# Patient Record
Sex: Male | Born: 1965 | Race: Black or African American | Hispanic: No | Marital: Married | Smoking: Former smoker
Health system: Southern US, Community
[De-identification: ages and names within clinical notes are randomized; demographics above are authoritative.]

## PROBLEM LIST (undated history)

## (undated) DIAGNOSIS — T7840XA Allergy, unspecified, initial encounter: Secondary | ICD-10-CM

## (undated) DIAGNOSIS — I209 Angina pectoris, unspecified: Secondary | ICD-10-CM

## (undated) DIAGNOSIS — F329 Major depressive disorder, single episode, unspecified: Secondary | ICD-10-CM

## (undated) DIAGNOSIS — I82409 Acute embolism and thrombosis of unspecified deep veins of unspecified lower extremity: Secondary | ICD-10-CM

## (undated) DIAGNOSIS — I428 Other cardiomyopathies: Secondary | ICD-10-CM

## (undated) DIAGNOSIS — G894 Chronic pain syndrome: Secondary | ICD-10-CM

## (undated) DIAGNOSIS — R002 Palpitations: Secondary | ICD-10-CM

## (undated) DIAGNOSIS — C61 Malignant neoplasm of prostate: Secondary | ICD-10-CM

## (undated) DIAGNOSIS — I422 Other hypertrophic cardiomyopathy: Secondary | ICD-10-CM

## (undated) DIAGNOSIS — Z87442 Personal history of urinary calculi: Secondary | ICD-10-CM

## (undated) DIAGNOSIS — G4730 Sleep apnea, unspecified: Secondary | ICD-10-CM

## (undated) DIAGNOSIS — E739 Lactose intolerance, unspecified: Secondary | ICD-10-CM

## (undated) DIAGNOSIS — D689 Coagulation defect, unspecified: Secondary | ICD-10-CM

## (undated) DIAGNOSIS — M1990 Unspecified osteoarthritis, unspecified site: Secondary | ICD-10-CM

## (undated) DIAGNOSIS — I10 Essential (primary) hypertension: Secondary | ICD-10-CM

## (undated) DIAGNOSIS — I739 Peripheral vascular disease, unspecified: Secondary | ICD-10-CM

## (undated) DIAGNOSIS — J45909 Unspecified asthma, uncomplicated: Secondary | ICD-10-CM

## (undated) DIAGNOSIS — R0602 Shortness of breath: Secondary | ICD-10-CM

## (undated) DIAGNOSIS — D869 Sarcoidosis, unspecified: Secondary | ICD-10-CM

## (undated) DIAGNOSIS — I499 Cardiac arrhythmia, unspecified: Secondary | ICD-10-CM

## (undated) DIAGNOSIS — E538 Deficiency of other specified B group vitamins: Secondary | ICD-10-CM

## (undated) DIAGNOSIS — E7800 Pure hypercholesterolemia, unspecified: Secondary | ICD-10-CM

## (undated) DIAGNOSIS — K219 Gastro-esophageal reflux disease without esophagitis: Secondary | ICD-10-CM

## (undated) DIAGNOSIS — F419 Anxiety disorder, unspecified: Secondary | ICD-10-CM

## (undated) DIAGNOSIS — C801 Malignant (primary) neoplasm, unspecified: Secondary | ICD-10-CM

## (undated) DIAGNOSIS — J189 Pneumonia, unspecified organism: Secondary | ICD-10-CM

## (undated) HISTORY — DX: Allergy, unspecified, initial encounter: T78.40XA

## (undated) HISTORY — PX: PROSTATE SURGERY: SHX751

## (undated) HISTORY — PX: PENILE PROSTHESIS IMPLANT: SHX240

## (undated) HISTORY — PX: EYE SURGERY: SHX253

## (undated) HISTORY — DX: Palpitations: R00.2

## (undated) HISTORY — DX: Other cardiomyopathies: I42.8

## (undated) HISTORY — DX: Sleep apnea, unspecified: G47.30

## (undated) HISTORY — PX: HAND SURGERY: SHX662

## (undated) HISTORY — DX: Sarcoidosis, unspecified: D86.9

## (undated) HISTORY — PX: LEG SKIN LESION  BIOPSY / EXCISION: SUR473

## (undated) HISTORY — DX: Pure hypercholesterolemia, unspecified: E78.00

## (undated) HISTORY — DX: Deficiency of other specified B group vitamins: E53.8

## (undated) HISTORY — PX: OTHER SURGICAL HISTORY: SHX169

## (undated) HISTORY — DX: Lactose intolerance, unspecified: E73.9

## (undated) HISTORY — DX: Coagulation defect, unspecified: D68.9

## (undated) HISTORY — DX: Chronic pain syndrome: G89.4

## (undated) HISTORY — PX: PARTIAL NEPHRECTOMY: SHX414

## (undated) HISTORY — DX: Major depressive disorder, single episode, unspecified: F32.9

## (undated) HISTORY — PX: TONSILLECTOMY: SUR1361

## (undated) HISTORY — PX: ELBOW SURGERY: SHX618

## (undated) HISTORY — PX: LUMBAR FUSION: SHX111

## (undated) HISTORY — PX: CARDIAC CATHETERIZATION: SHX172

## (undated) HISTORY — DX: Unspecified osteoarthritis, unspecified site: M19.90

## (undated) HISTORY — DX: Essential (primary) hypertension: I10

## (undated) HISTORY — DX: Personal history of urinary calculi: Z87.442

## (undated) HISTORY — DX: Gastro-esophageal reflux disease without esophagitis: K21.9

## (undated) HISTORY — DX: Unspecified asthma, uncomplicated: J45.909

---

## 1999-06-02 ENCOUNTER — Emergency Department (HOSPITAL_COMMUNITY): Admission: EM | Admit: 1999-06-02 | Discharge: 1999-06-02 | Payer: Self-pay | Admitting: Emergency Medicine

## 1999-07-05 ENCOUNTER — Inpatient Hospital Stay (HOSPITAL_COMMUNITY): Admission: EM | Admit: 1999-07-05 | Discharge: 1999-07-08 | Payer: Self-pay | Admitting: Emergency Medicine

## 1999-07-05 ENCOUNTER — Encounter: Payer: Self-pay | Admitting: Emergency Medicine

## 1999-07-06 ENCOUNTER — Encounter: Payer: Self-pay | Admitting: Cardiovascular Disease

## 1999-07-08 ENCOUNTER — Encounter: Payer: Self-pay | Admitting: Cardiovascular Disease

## 1999-09-07 ENCOUNTER — Emergency Department (HOSPITAL_COMMUNITY): Admission: EM | Admit: 1999-09-07 | Discharge: 1999-09-07 | Payer: Self-pay | Admitting: Emergency Medicine

## 1999-09-07 ENCOUNTER — Encounter: Payer: Self-pay | Admitting: Emergency Medicine

## 2000-02-01 ENCOUNTER — Ambulatory Visit (HOSPITAL_COMMUNITY): Admission: RE | Admit: 2000-02-01 | Discharge: 2000-02-01 | Payer: Self-pay | Admitting: Neurology

## 2000-02-01 ENCOUNTER — Encounter: Payer: Self-pay | Admitting: Neurology

## 2000-02-01 ENCOUNTER — Emergency Department (HOSPITAL_COMMUNITY): Admission: EM | Admit: 2000-02-01 | Discharge: 2000-02-01 | Payer: Self-pay | Admitting: Emergency Medicine

## 2001-09-06 DIAGNOSIS — I739 Peripheral vascular disease, unspecified: Secondary | ICD-10-CM

## 2001-09-06 HISTORY — DX: Peripheral vascular disease, unspecified: I73.9

## 2001-10-08 ENCOUNTER — Encounter: Payer: Self-pay | Admitting: Emergency Medicine

## 2001-10-08 ENCOUNTER — Emergency Department (HOSPITAL_COMMUNITY): Admission: EM | Admit: 2001-10-08 | Discharge: 2001-10-08 | Payer: Self-pay | Admitting: Emergency Medicine

## 2001-11-06 ENCOUNTER — Ambulatory Visit (HOSPITAL_COMMUNITY): Admission: RE | Admit: 2001-11-06 | Discharge: 2001-11-06 | Payer: Self-pay | Admitting: Neurosurgery

## 2001-11-06 ENCOUNTER — Encounter: Payer: Self-pay | Admitting: Neurosurgery

## 2002-01-18 ENCOUNTER — Encounter: Payer: Self-pay | Admitting: Neurosurgery

## 2002-01-18 ENCOUNTER — Encounter: Admission: RE | Admit: 2002-01-18 | Discharge: 2002-01-18 | Payer: Self-pay | Admitting: Neurosurgery

## 2002-01-29 ENCOUNTER — Ambulatory Visit (HOSPITAL_COMMUNITY): Admission: RE | Admit: 2002-01-29 | Discharge: 2002-01-29 | Payer: Self-pay | Admitting: Neurosurgery

## 2002-01-29 ENCOUNTER — Encounter: Payer: Self-pay | Admitting: Neurosurgery

## 2002-02-20 ENCOUNTER — Ambulatory Visit (HOSPITAL_COMMUNITY): Admission: RE | Admit: 2002-02-20 | Discharge: 2002-02-20 | Payer: Self-pay | Admitting: Specialist

## 2002-02-20 ENCOUNTER — Encounter: Payer: Self-pay | Admitting: Specialist

## 2002-04-16 ENCOUNTER — Encounter: Payer: Self-pay | Admitting: Internal Medicine

## 2002-04-16 ENCOUNTER — Inpatient Hospital Stay (HOSPITAL_COMMUNITY): Admission: EM | Admit: 2002-04-16 | Discharge: 2002-04-19 | Payer: Self-pay | Admitting: Emergency Medicine

## 2002-04-16 ENCOUNTER — Encounter: Payer: Self-pay | Admitting: Emergency Medicine

## 2002-04-17 ENCOUNTER — Encounter: Payer: Self-pay | Admitting: Internal Medicine

## 2002-04-17 ENCOUNTER — Encounter (INDEPENDENT_AMBULATORY_CARE_PROVIDER_SITE_OTHER): Payer: Self-pay | Admitting: Cardiovascular Disease

## 2002-07-16 ENCOUNTER — Encounter: Payer: Self-pay | Admitting: Orthopedic Surgery

## 2002-07-16 ENCOUNTER — Ambulatory Visit (HOSPITAL_COMMUNITY): Admission: RE | Admit: 2002-07-16 | Discharge: 2002-07-16 | Payer: Self-pay | Admitting: Orthopedic Surgery

## 2003-04-30 ENCOUNTER — Encounter: Payer: Self-pay | Admitting: Emergency Medicine

## 2003-04-30 ENCOUNTER — Emergency Department (HOSPITAL_COMMUNITY): Admission: EM | Admit: 2003-04-30 | Discharge: 2003-04-30 | Payer: Self-pay | Admitting: Emergency Medicine

## 2003-05-17 ENCOUNTER — Encounter: Admission: RE | Admit: 2003-05-17 | Discharge: 2003-07-17 | Payer: Self-pay | Admitting: Orthopedic Surgery

## 2003-08-26 ENCOUNTER — Encounter: Payer: Self-pay | Admitting: Internal Medicine

## 2003-10-09 ENCOUNTER — Inpatient Hospital Stay (HOSPITAL_COMMUNITY): Admission: EM | Admit: 2003-10-09 | Discharge: 2003-10-11 | Payer: Self-pay | Admitting: Emergency Medicine

## 2003-10-10 ENCOUNTER — Encounter: Payer: Self-pay | Admitting: Cardiology

## 2003-10-16 ENCOUNTER — Encounter: Payer: Self-pay | Admitting: Internal Medicine

## 2004-03-18 ENCOUNTER — Encounter: Admission: RE | Admit: 2004-03-18 | Discharge: 2004-03-18 | Payer: Self-pay | Admitting: Orthopedic Surgery

## 2004-06-06 ENCOUNTER — Emergency Department (HOSPITAL_COMMUNITY): Admission: EM | Admit: 2004-06-06 | Discharge: 2004-06-07 | Payer: Self-pay | Admitting: Emergency Medicine

## 2004-07-14 ENCOUNTER — Ambulatory Visit: Payer: Self-pay | Admitting: Internal Medicine

## 2004-09-30 ENCOUNTER — Emergency Department (HOSPITAL_COMMUNITY): Admission: EM | Admit: 2004-09-30 | Discharge: 2004-10-01 | Payer: Self-pay | Admitting: Emergency Medicine

## 2004-10-02 ENCOUNTER — Ambulatory Visit: Payer: Self-pay | Admitting: Internal Medicine

## 2004-12-13 ENCOUNTER — Encounter: Admission: RE | Admit: 2004-12-13 | Discharge: 2004-12-13 | Payer: Self-pay | Admitting: Neurology

## 2004-12-16 ENCOUNTER — Ambulatory Visit: Payer: Self-pay | Admitting: Internal Medicine

## 2005-01-08 ENCOUNTER — Ambulatory Visit: Payer: Self-pay | Admitting: Internal Medicine

## 2005-02-02 ENCOUNTER — Encounter: Admission: RE | Admit: 2005-02-02 | Discharge: 2005-02-02 | Payer: Self-pay | Admitting: Neurology

## 2005-03-18 ENCOUNTER — Ambulatory Visit: Payer: Self-pay | Admitting: Internal Medicine

## 2005-03-25 ENCOUNTER — Ambulatory Visit: Payer: Self-pay | Admitting: Internal Medicine

## 2005-04-22 ENCOUNTER — Ambulatory Visit: Payer: Self-pay | Admitting: Internal Medicine

## 2005-05-06 ENCOUNTER — Ambulatory Visit: Payer: Self-pay | Admitting: Internal Medicine

## 2005-05-13 ENCOUNTER — Emergency Department (HOSPITAL_COMMUNITY): Admission: EM | Admit: 2005-05-13 | Discharge: 2005-05-13 | Payer: Self-pay | Admitting: Emergency Medicine

## 2005-05-25 ENCOUNTER — Ambulatory Visit: Payer: Self-pay | Admitting: Internal Medicine

## 2005-05-31 ENCOUNTER — Ambulatory Visit: Payer: Self-pay | Admitting: Internal Medicine

## 2005-06-03 ENCOUNTER — Ambulatory Visit: Payer: Self-pay

## 2005-06-09 ENCOUNTER — Emergency Department (HOSPITAL_COMMUNITY): Admission: EM | Admit: 2005-06-09 | Discharge: 2005-06-10 | Payer: Self-pay | Admitting: Emergency Medicine

## 2005-06-11 ENCOUNTER — Ambulatory Visit: Payer: Self-pay | Admitting: Internal Medicine

## 2005-06-17 ENCOUNTER — Ambulatory Visit: Payer: Self-pay | Admitting: Internal Medicine

## 2005-06-21 ENCOUNTER — Ambulatory Visit: Payer: Self-pay | Admitting: Internal Medicine

## 2005-06-22 ENCOUNTER — Encounter: Payer: Self-pay | Admitting: Internal Medicine

## 2005-06-28 ENCOUNTER — Ambulatory Visit: Payer: Self-pay | Admitting: Internal Medicine

## 2005-07-13 ENCOUNTER — Ambulatory Visit: Payer: Self-pay | Admitting: Internal Medicine

## 2005-08-11 ENCOUNTER — Ambulatory Visit: Payer: Self-pay | Admitting: Internal Medicine

## 2005-08-18 ENCOUNTER — Emergency Department (HOSPITAL_COMMUNITY): Admission: EM | Admit: 2005-08-18 | Discharge: 2005-08-18 | Payer: Self-pay | Admitting: Emergency Medicine

## 2005-09-22 ENCOUNTER — Ambulatory Visit: Payer: Self-pay | Admitting: Internal Medicine

## 2005-09-24 ENCOUNTER — Inpatient Hospital Stay (HOSPITAL_COMMUNITY): Admission: AD | Admit: 2005-09-24 | Discharge: 2005-09-27 | Payer: Self-pay | Admitting: Cardiovascular Disease

## 2005-10-08 ENCOUNTER — Encounter
Admission: RE | Admit: 2005-10-08 | Discharge: 2005-10-08 | Payer: Self-pay | Admitting: Physical Medicine and Rehabilitation

## 2005-10-20 ENCOUNTER — Ambulatory Visit: Payer: Self-pay | Admitting: Internal Medicine

## 2006-01-17 ENCOUNTER — Ambulatory Visit: Payer: Self-pay | Admitting: Internal Medicine

## 2006-05-23 ENCOUNTER — Ambulatory Visit: Payer: Self-pay | Admitting: Internal Medicine

## 2006-07-14 ENCOUNTER — Ambulatory Visit: Payer: Self-pay | Admitting: Internal Medicine

## 2006-07-14 LAB — CONVERTED CEMR LAB
Calcium: 9.5 mg/dL (ref 8.4–10.5)
PSA: 1.08 ng/mL (ref 0.10–4.00)
Phosphorus Concentration: 2.9 mg/dL (ref 2.3–4.6)

## 2006-09-13 ENCOUNTER — Ambulatory Visit: Payer: Self-pay | Admitting: Internal Medicine

## 2006-09-14 ENCOUNTER — Emergency Department (HOSPITAL_COMMUNITY): Admission: EM | Admit: 2006-09-14 | Discharge: 2006-09-14 | Payer: Self-pay | Admitting: Emergency Medicine

## 2006-10-27 ENCOUNTER — Ambulatory Visit: Payer: Self-pay | Admitting: Internal Medicine

## 2006-11-11 ENCOUNTER — Ambulatory Visit: Payer: Self-pay | Admitting: Internal Medicine

## 2006-11-11 LAB — CONVERTED CEMR LAB
ALT: 18 units/L (ref 0–40)
AST: 15 units/L (ref 0–37)
Albumin: 3.9 g/dL (ref 3.5–5.2)
Alkaline Phosphatase: 50 units/L (ref 39–117)
Basophils Absolute: 0.1 10*3/uL (ref 0.0–0.1)
Basophils Relative: 0.7 % (ref 0.0–1.0)
Bilirubin, Direct: 0.2 mg/dL (ref 0.0–0.3)
Eosinophils Absolute: 0.1 10*3/uL (ref 0.0–0.6)
Eosinophils Relative: 0.8 % (ref 0.0–5.0)
HCT: 45.1 % (ref 39.0–52.0)
Hemoglobin: 15.2 g/dL (ref 13.0–17.0)
Lymphocytes Relative: 19.5 % (ref 12.0–46.0)
MCHC: 33.8 g/dL (ref 30.0–36.0)
MCV: 85.1 fL (ref 78.0–100.0)
Monocytes Absolute: 0.8 10*3/uL — ABNORMAL HIGH (ref 0.2–0.7)
Monocytes Relative: 11.1 % — ABNORMAL HIGH (ref 3.0–11.0)
Neutro Abs: 5.1 10*3/uL (ref 1.4–7.7)
Neutrophils Relative %: 67.9 % (ref 43.0–77.0)
Platelets: 390 10*3/uL (ref 150–400)
RBC: 5.3 M/uL (ref 4.22–5.81)
RDW: 14.1 % (ref 11.5–14.6)
Total Bilirubin: 0.8 mg/dL (ref 0.3–1.2)
Total Protein: 7.3 g/dL (ref 6.0–8.3)
WBC: 7.6 10*3/uL (ref 4.5–10.5)

## 2006-11-24 ENCOUNTER — Ambulatory Visit: Payer: Self-pay | Admitting: Internal Medicine

## 2006-12-14 ENCOUNTER — Ambulatory Visit: Payer: Self-pay | Admitting: Internal Medicine

## 2006-12-14 LAB — CONVERTED CEMR LAB
ALT: 23 units/L (ref 0–40)
AST: 19 units/L (ref 0–37)
Albumin: 4 g/dL (ref 3.5–5.2)
Alkaline Phosphatase: 49 units/L (ref 39–117)
Basophils Absolute: 0 10*3/uL (ref 0.0–0.1)
Basophils Relative: 0.3 % (ref 0.0–1.0)
Bilirubin, Direct: 0.2 mg/dL (ref 0.0–0.3)
Eosinophils Absolute: 0.1 10*3/uL (ref 0.0–0.6)
Eosinophils Relative: 1.1 % (ref 0.0–5.0)
HCT: 40.5 % (ref 39.0–52.0)
Hemoglobin: 14.1 g/dL (ref 13.0–17.0)
Lymphocytes Relative: 19.2 % (ref 12.0–46.0)
MCHC: 34.9 g/dL (ref 30.0–36.0)
MCV: 85.6 fL (ref 78.0–100.0)
Monocytes Absolute: 1.1 10*3/uL — ABNORMAL HIGH (ref 0.2–0.7)
Monocytes Relative: 14.9 % — ABNORMAL HIGH (ref 3.0–11.0)
Neutro Abs: 5 10*3/uL (ref 1.4–7.7)
Neutrophils Relative %: 64.5 % (ref 43.0–77.0)
Platelets: 369 10*3/uL (ref 150–400)
RBC: 4.73 M/uL (ref 4.22–5.81)
RDW: 13.6 % (ref 11.5–14.6)
Total Bilirubin: 0.9 mg/dL (ref 0.3–1.2)
Total Protein: 6.5 g/dL (ref 6.0–8.3)
WBC: 7.7 10*3/uL (ref 4.5–10.5)

## 2006-12-15 ENCOUNTER — Inpatient Hospital Stay (HOSPITAL_COMMUNITY): Admission: RE | Admit: 2006-12-15 | Discharge: 2006-12-19 | Payer: Self-pay | Admitting: Neurosurgery

## 2006-12-15 ENCOUNTER — Encounter (INDEPENDENT_AMBULATORY_CARE_PROVIDER_SITE_OTHER): Payer: Self-pay | Admitting: Specialist

## 2007-01-11 ENCOUNTER — Ambulatory Visit: Payer: Self-pay | Admitting: Internal Medicine

## 2007-01-11 LAB — CONVERTED CEMR LAB
ALT: 25 units/L (ref 0–40)
AST: 21 units/L (ref 0–37)
Albumin: 4 g/dL (ref 3.5–5.2)
Alkaline Phosphatase: 61 units/L (ref 39–117)
Basophils Absolute: 0 10*3/uL (ref 0.0–0.1)
Basophils Relative: 0.3 % (ref 0.0–1.0)
Bilirubin, Direct: 0.1 mg/dL (ref 0.0–0.3)
Eosinophils Absolute: 0.1 10*3/uL (ref 0.0–0.6)
Eosinophils Relative: 1.8 % (ref 0.0–5.0)
HCT: 40.6 % (ref 39.0–52.0)
Hemoglobin: 13.9 g/dL (ref 13.0–17.0)
Lymphocytes Relative: 26 % (ref 12.0–46.0)
MCHC: 34.2 g/dL (ref 30.0–36.0)
MCV: 86.2 fL (ref 78.0–100.0)
Monocytes Absolute: 0.7 10*3/uL (ref 0.2–0.7)
Monocytes Relative: 9 % (ref 3.0–11.0)
Neutro Abs: 5 10*3/uL (ref 1.4–7.7)
Neutrophils Relative %: 62.9 % (ref 43.0–77.0)
Platelets: 390 10*3/uL (ref 150–400)
RBC: 4.71 M/uL (ref 4.22–5.81)
RDW: 14.3 % (ref 11.5–14.6)
Total Bilirubin: 0.9 mg/dL (ref 0.3–1.2)
Total Protein: 6.9 g/dL (ref 6.0–8.3)
WBC: 7.8 10*3/uL (ref 4.5–10.5)

## 2007-02-14 ENCOUNTER — Ambulatory Visit: Payer: Self-pay | Admitting: Internal Medicine

## 2007-02-14 LAB — CONVERTED CEMR LAB
ALT: 24 units/L (ref 0–40)
AST: 22 units/L (ref 0–37)
Albumin: 4.1 g/dL (ref 3.5–5.2)
Alkaline Phosphatase: 54 units/L (ref 39–117)
Basophils Absolute: 0 10*3/uL (ref 0.0–0.1)
Basophils Relative: 0.3 % (ref 0.0–1.0)
Bilirubin, Direct: 0.1 mg/dL (ref 0.0–0.3)
Eosinophils Absolute: 0.1 10*3/uL (ref 0.0–0.6)
Eosinophils Relative: 0.9 % (ref 0.0–5.0)
HCT: 38.2 % — ABNORMAL LOW (ref 39.0–52.0)
Hemoglobin: 13.4 g/dL (ref 13.0–17.0)
Lymphocytes Relative: 9.3 % — ABNORMAL LOW (ref 12.0–46.0)
MCHC: 35 g/dL (ref 30.0–36.0)
MCV: 84.5 fL (ref 78.0–100.0)
Monocytes Absolute: 0.7 10*3/uL (ref 0.2–0.7)
Monocytes Relative: 10 % (ref 3.0–11.0)
Neutro Abs: 5.2 10*3/uL (ref 1.4–7.7)
Neutrophils Relative %: 79.5 % — ABNORMAL HIGH (ref 43.0–77.0)
Platelets: 334 10*3/uL (ref 150–400)
RBC: 4.52 M/uL (ref 4.22–5.81)
RDW: 13.6 % (ref 11.5–14.6)
Total Bilirubin: 0.8 mg/dL (ref 0.3–1.2)
Total Protein: 6.9 g/dL (ref 6.0–8.3)
WBC: 6.6 10*3/uL (ref 4.5–10.5)

## 2007-02-24 ENCOUNTER — Encounter
Admission: RE | Admit: 2007-02-24 | Discharge: 2007-02-24 | Payer: Self-pay | Admitting: Physical Medicine and Rehabilitation

## 2007-03-06 DIAGNOSIS — D869 Sarcoidosis, unspecified: Secondary | ICD-10-CM

## 2007-03-06 DIAGNOSIS — I10 Essential (primary) hypertension: Secondary | ICD-10-CM

## 2007-03-06 DIAGNOSIS — E7800 Pure hypercholesterolemia, unspecified: Secondary | ICD-10-CM | POA: Insufficient documentation

## 2007-03-06 DIAGNOSIS — F329 Major depressive disorder, single episode, unspecified: Secondary | ICD-10-CM

## 2007-03-06 DIAGNOSIS — F339 Major depressive disorder, recurrent, unspecified: Secondary | ICD-10-CM | POA: Insufficient documentation

## 2007-03-06 DIAGNOSIS — K209 Esophagitis, unspecified without bleeding: Secondary | ICD-10-CM | POA: Insufficient documentation

## 2007-03-06 DIAGNOSIS — F3289 Other specified depressive episodes: Secondary | ICD-10-CM

## 2007-03-06 DIAGNOSIS — I421 Obstructive hypertrophic cardiomyopathy: Secondary | ICD-10-CM | POA: Insufficient documentation

## 2007-03-06 DIAGNOSIS — G894 Chronic pain syndrome: Secondary | ICD-10-CM | POA: Insufficient documentation

## 2007-03-06 DIAGNOSIS — I428 Other cardiomyopathies: Secondary | ICD-10-CM

## 2007-03-06 DIAGNOSIS — E538 Deficiency of other specified B group vitamins: Secondary | ICD-10-CM

## 2007-03-06 DIAGNOSIS — G4730 Sleep apnea, unspecified: Secondary | ICD-10-CM

## 2007-03-06 HISTORY — DX: Chronic pain syndrome: G89.4

## 2007-03-06 HISTORY — DX: Deficiency of other specified B group vitamins: E53.8

## 2007-03-06 HISTORY — DX: Other cardiomyopathies: I42.8

## 2007-03-06 HISTORY — DX: Sarcoidosis, unspecified: D86.9

## 2007-03-06 HISTORY — DX: Essential (primary) hypertension: I10

## 2007-03-06 HISTORY — DX: Other specified depressive episodes: F32.89

## 2007-03-06 HISTORY — DX: Pure hypercholesterolemia, unspecified: E78.00

## 2007-03-06 HISTORY — DX: Sleep apnea, unspecified: G47.30

## 2007-03-06 HISTORY — DX: Major depressive disorder, single episode, unspecified: F32.9

## 2007-03-13 ENCOUNTER — Ambulatory Visit: Payer: Self-pay | Admitting: Internal Medicine

## 2007-03-13 ENCOUNTER — Emergency Department (HOSPITAL_COMMUNITY): Admission: EM | Admit: 2007-03-13 | Discharge: 2007-03-13 | Payer: Self-pay | Admitting: Emergency Medicine

## 2007-03-13 LAB — CONVERTED CEMR LAB
ALT: 15 units/L (ref 0–53)
AST: 17 units/L (ref 0–37)
Albumin: 3.7 g/dL (ref 3.5–5.2)
Alkaline Phosphatase: 53 units/L (ref 39–117)
Basophils Absolute: 0 10*3/uL (ref 0.0–0.1)
Basophils Relative: 0.5 % (ref 0.0–1.0)
Bilirubin, Direct: 0.1 mg/dL (ref 0.0–0.3)
Eosinophils Absolute: 0.1 10*3/uL (ref 0.0–0.6)
Eosinophils Relative: 1.9 % (ref 0.0–5.0)
HCT: 39.1 % (ref 39.0–52.0)
Hemoglobin: 12.9 g/dL — ABNORMAL LOW (ref 13.0–17.0)
Lymphocytes Relative: 28.1 % (ref 12.0–46.0)
MCHC: 32.9 g/dL (ref 30.0–36.0)
MCV: 85.7 fL (ref 78.0–100.0)
Monocytes Absolute: 1.1 10*3/uL — ABNORMAL HIGH (ref 0.2–0.7)
Monocytes Relative: 17.5 % — ABNORMAL HIGH (ref 3.0–11.0)
Neutro Abs: 3.5 10*3/uL (ref 1.4–7.7)
Neutrophils Relative %: 52 % (ref 43.0–77.0)
Platelets: 400 10*3/uL (ref 150–400)
RBC: 4.57 M/uL (ref 4.22–5.81)
RDW: 13 % (ref 11.5–14.6)
Total Bilirubin: 0.5 mg/dL (ref 0.3–1.2)
Total Protein: 6.4 g/dL (ref 6.0–8.3)
WBC: 6.5 10*3/uL (ref 4.5–10.5)

## 2007-03-25 ENCOUNTER — Ambulatory Visit: Payer: Self-pay | Admitting: Cardiovascular Disease

## 2007-03-25 ENCOUNTER — Inpatient Hospital Stay (HOSPITAL_COMMUNITY): Admission: EM | Admit: 2007-03-25 | Discharge: 2007-03-28 | Payer: Self-pay | Admitting: Emergency Medicine

## 2007-03-26 ENCOUNTER — Ambulatory Visit: Payer: Self-pay | Admitting: Internal Medicine

## 2007-04-17 ENCOUNTER — Encounter: Payer: Self-pay | Admitting: Internal Medicine

## 2007-05-17 ENCOUNTER — Ambulatory Visit: Payer: Self-pay | Admitting: Internal Medicine

## 2007-05-24 ENCOUNTER — Telehealth: Payer: Self-pay | Admitting: Internal Medicine

## 2007-05-31 ENCOUNTER — Telehealth (INDEPENDENT_AMBULATORY_CARE_PROVIDER_SITE_OTHER): Payer: Self-pay | Admitting: *Deleted

## 2007-06-01 ENCOUNTER — Ambulatory Visit: Payer: Self-pay | Admitting: Internal Medicine

## 2007-06-01 DIAGNOSIS — M1990 Unspecified osteoarthritis, unspecified site: Secondary | ICD-10-CM

## 2007-06-01 DIAGNOSIS — K219 Gastro-esophageal reflux disease without esophagitis: Secondary | ICD-10-CM | POA: Insufficient documentation

## 2007-06-01 HISTORY — DX: Gastro-esophageal reflux disease without esophagitis: K21.9

## 2007-06-01 HISTORY — DX: Unspecified osteoarthritis, unspecified site: M19.90

## 2007-06-05 ENCOUNTER — Telehealth: Payer: Self-pay | Admitting: Internal Medicine

## 2007-06-27 ENCOUNTER — Encounter: Payer: Self-pay | Admitting: Internal Medicine

## 2007-06-30 ENCOUNTER — Encounter: Payer: Self-pay | Admitting: Internal Medicine

## 2007-07-13 ENCOUNTER — Encounter: Payer: Self-pay | Admitting: Internal Medicine

## 2007-07-19 ENCOUNTER — Encounter: Payer: Self-pay | Admitting: Internal Medicine

## 2007-08-02 ENCOUNTER — Ambulatory Visit (HOSPITAL_COMMUNITY): Admission: RE | Admit: 2007-08-02 | Discharge: 2007-08-02 | Payer: Self-pay | Admitting: Neurosurgery

## 2007-09-07 HISTORY — PX: UPPER GASTROINTESTINAL ENDOSCOPY: SHX188

## 2007-09-14 ENCOUNTER — Telehealth: Payer: Self-pay | Admitting: Internal Medicine

## 2007-10-12 ENCOUNTER — Observation Stay (HOSPITAL_COMMUNITY): Admission: EM | Admit: 2007-10-12 | Discharge: 2007-10-13 | Payer: Self-pay | Admitting: Emergency Medicine

## 2007-10-13 ENCOUNTER — Encounter: Payer: Self-pay | Admitting: Internal Medicine

## 2007-10-18 ENCOUNTER — Ambulatory Visit: Payer: Self-pay | Admitting: Internal Medicine

## 2007-10-19 ENCOUNTER — Telehealth: Payer: Self-pay | Admitting: Internal Medicine

## 2007-10-20 ENCOUNTER — Encounter: Payer: Self-pay | Admitting: Internal Medicine

## 2007-10-20 ENCOUNTER — Ambulatory Visit: Payer: Self-pay | Admitting: Internal Medicine

## 2007-10-20 ENCOUNTER — Ambulatory Visit: Payer: Self-pay

## 2007-10-20 DIAGNOSIS — R079 Chest pain, unspecified: Secondary | ICD-10-CM | POA: Insufficient documentation

## 2007-10-23 ENCOUNTER — Ambulatory Visit: Payer: Self-pay | Admitting: Internal Medicine

## 2007-10-23 ENCOUNTER — Telehealth: Payer: Self-pay | Admitting: Internal Medicine

## 2007-10-23 DIAGNOSIS — R002 Palpitations: Secondary | ICD-10-CM

## 2007-10-23 HISTORY — DX: Palpitations: R00.2

## 2007-10-27 ENCOUNTER — Ambulatory Visit: Payer: Self-pay | Admitting: Internal Medicine

## 2007-10-30 ENCOUNTER — Ambulatory Visit: Payer: Self-pay | Admitting: Cardiology

## 2007-10-31 ENCOUNTER — Encounter: Payer: Self-pay | Admitting: Cardiology

## 2007-10-31 ENCOUNTER — Ambulatory Visit: Payer: Self-pay

## 2007-11-01 ENCOUNTER — Ambulatory Visit: Payer: Self-pay | Admitting: Cardiology

## 2007-11-09 ENCOUNTER — Encounter: Payer: Self-pay | Admitting: Internal Medicine

## 2007-11-14 ENCOUNTER — Encounter: Payer: Self-pay | Admitting: Internal Medicine

## 2007-11-25 ENCOUNTER — Telehealth: Payer: Self-pay | Admitting: Internal Medicine

## 2007-12-06 ENCOUNTER — Ambulatory Visit: Payer: Self-pay | Admitting: Cardiology

## 2007-12-20 ENCOUNTER — Telehealth: Payer: Self-pay | Admitting: Internal Medicine

## 2007-12-26 ENCOUNTER — Encounter: Payer: Self-pay | Admitting: Internal Medicine

## 2008-01-08 ENCOUNTER — Ambulatory Visit: Payer: Self-pay | Admitting: Internal Medicine

## 2008-01-08 DIAGNOSIS — J069 Acute upper respiratory infection, unspecified: Secondary | ICD-10-CM | POA: Insufficient documentation

## 2008-01-16 ENCOUNTER — Encounter: Payer: Self-pay | Admitting: Internal Medicine

## 2008-02-29 ENCOUNTER — Encounter: Payer: Self-pay | Admitting: Internal Medicine

## 2008-03-13 ENCOUNTER — Ambulatory Visit: Payer: Self-pay | Admitting: Internal Medicine

## 2008-03-13 LAB — CONVERTED CEMR LAB
Basophils Absolute: 0 10*3/uL (ref 0.0–0.1)
Basophils Relative: 1 % (ref 0.0–1.0)
Eosinophils Absolute: 0.2 10*3/uL (ref 0.0–0.7)
Eosinophils Relative: 3.9 % (ref 0.0–5.0)
HCT: 44.2 % (ref 39.0–52.0)
Hemoglobin: 14.9 g/dL (ref 13.0–17.0)
Lymphocytes Relative: 19.9 % (ref 12.0–46.0)
MCHC: 33.7 g/dL (ref 30.0–36.0)
MCV: 88.3 fL (ref 78.0–100.0)
Monocytes Absolute: 0.7 10*3/uL (ref 0.1–1.0)
Monocytes Relative: 14.8 % — ABNORMAL HIGH (ref 3.0–12.0)
Neutro Abs: 2.7 10*3/uL (ref 1.4–7.7)
Neutrophils Relative %: 60.4 % (ref 43.0–77.0)
Platelets: 292 10*3/uL (ref 150–400)
RBC: 5 M/uL (ref 4.22–5.81)
RDW: 14 % (ref 11.5–14.6)
WBC: 4.5 10*3/uL (ref 4.5–10.5)

## 2008-03-14 ENCOUNTER — Telehealth: Payer: Self-pay | Admitting: Internal Medicine

## 2008-04-29 ENCOUNTER — Encounter: Payer: Self-pay | Admitting: Internal Medicine

## 2008-05-25 ENCOUNTER — Ambulatory Visit: Payer: Self-pay | Admitting: *Deleted

## 2008-05-26 ENCOUNTER — Ambulatory Visit: Payer: Self-pay | Admitting: Internal Medicine

## 2008-05-26 ENCOUNTER — Inpatient Hospital Stay (HOSPITAL_COMMUNITY): Admission: EM | Admit: 2008-05-26 | Discharge: 2008-05-28 | Payer: Self-pay | Admitting: Emergency Medicine

## 2008-05-26 ENCOUNTER — Ambulatory Visit: Payer: Self-pay | Admitting: *Deleted

## 2008-05-31 ENCOUNTER — Encounter: Admission: RE | Admit: 2008-05-31 | Discharge: 2008-05-31 | Payer: Self-pay | Admitting: Orthopedic Surgery

## 2008-06-03 ENCOUNTER — Encounter
Admission: RE | Admit: 2008-06-03 | Discharge: 2008-06-03 | Payer: Self-pay | Admitting: Physical Medicine and Rehabilitation

## 2008-06-05 ENCOUNTER — Ambulatory Visit: Payer: Self-pay | Admitting: Internal Medicine

## 2008-07-01 ENCOUNTER — Encounter: Payer: Self-pay | Admitting: Internal Medicine

## 2008-07-03 ENCOUNTER — Telehealth: Payer: Self-pay | Admitting: Internal Medicine

## 2008-07-23 ENCOUNTER — Emergency Department (HOSPITAL_COMMUNITY): Admission: EM | Admit: 2008-07-23 | Discharge: 2008-07-23 | Payer: Self-pay | Admitting: Emergency Medicine

## 2008-08-19 ENCOUNTER — Encounter: Payer: Self-pay | Admitting: Internal Medicine

## 2008-08-28 ENCOUNTER — Encounter: Payer: Self-pay | Admitting: Internal Medicine

## 2008-09-13 ENCOUNTER — Encounter: Payer: Self-pay | Admitting: Internal Medicine

## 2008-09-30 ENCOUNTER — Telehealth: Payer: Self-pay | Admitting: Internal Medicine

## 2008-10-02 ENCOUNTER — Telehealth: Payer: Self-pay | Admitting: Internal Medicine

## 2008-10-23 ENCOUNTER — Encounter: Payer: Self-pay | Admitting: Internal Medicine

## 2008-10-28 ENCOUNTER — Encounter: Payer: Self-pay | Admitting: Internal Medicine

## 2008-11-08 ENCOUNTER — Ambulatory Visit: Payer: Self-pay | Admitting: Internal Medicine

## 2008-11-08 DIAGNOSIS — E739 Lactose intolerance, unspecified: Secondary | ICD-10-CM

## 2008-11-08 DIAGNOSIS — E7800 Pure hypercholesterolemia, unspecified: Secondary | ICD-10-CM | POA: Insufficient documentation

## 2008-11-08 HISTORY — DX: Lactose intolerance, unspecified: E73.9

## 2008-11-08 LAB — CONVERTED CEMR LAB
AST: 26 units/L (ref 0–37)
Cholesterol: 104 mg/dL (ref 0–200)
Hgb A1c MFr Bld: 5.6 % (ref 4.6–6.0)

## 2008-11-13 ENCOUNTER — Inpatient Hospital Stay (HOSPITAL_COMMUNITY): Admission: RE | Admit: 2008-11-13 | Discharge: 2008-11-18 | Payer: Self-pay | Admitting: Neurosurgery

## 2008-12-20 ENCOUNTER — Encounter: Payer: Self-pay | Admitting: Internal Medicine

## 2009-02-12 ENCOUNTER — Encounter: Payer: Self-pay | Admitting: Internal Medicine

## 2009-03-13 ENCOUNTER — Ambulatory Visit: Payer: Self-pay | Admitting: Internal Medicine

## 2009-03-13 LAB — CONVERTED CEMR LAB
ALT: 25 units/L (ref 0–53)
AST: 26 units/L (ref 0–37)
Albumin: 4.3 g/dL (ref 3.5–5.2)
Alkaline Phosphatase: 40 units/L (ref 39–117)
BUN: 12 mg/dL (ref 6–23)
Basophils Absolute: 0 10*3/uL (ref 0.0–0.1)
Basophils Relative: 0.3 % (ref 0.0–3.0)
Bilirubin, Direct: 0.2 mg/dL (ref 0.0–0.3)
CO2: 28 meq/L (ref 19–32)
Calcium: 9.3 mg/dL (ref 8.4–10.5)
Chloride: 106 meq/L (ref 96–112)
Cholesterol: 121 mg/dL (ref 0–200)
Creatinine, Ser: 1 mg/dL (ref 0.4–1.5)
Eosinophils Absolute: 0.1 10*3/uL (ref 0.0–0.7)
Eosinophils Relative: 3.8 % (ref 0.0–5.0)
GFR calc non Af Amer: 104.45 mL/min (ref >60)
Glucose, Bld: 90 mg/dL (ref 70–99)
HCT: 41.6 % (ref 39.0–52.0)
Hemoglobin: 14.1 g/dL (ref 13.0–17.0)
Hgb A1c MFr Bld: 5.7 % (ref 4.6–6.5)
Lymphocytes Relative: 25.1 % (ref 12.0–46.0)
Lymphs Abs: 1 10*3/uL (ref 0.7–4.0)
MCHC: 33.9 g/dL (ref 30.0–36.0)
MCV: 86.5 fL (ref 78.0–100.0)
Monocytes Absolute: 0.7 10*3/uL (ref 0.1–1.0)
Monocytes Relative: 17.3 % — ABNORMAL HIGH (ref 3.0–12.0)
Neutro Abs: 2 10*3/uL (ref 1.4–7.7)
Neutrophils Relative %: 53.5 % (ref 43.0–77.0)
Platelets: 278 10*3/uL (ref 150.0–400.0)
Potassium: 4.4 meq/L (ref 3.5–5.1)
RBC: 4.81 M/uL (ref 4.22–5.81)
RDW: 14.6 % (ref 11.5–14.6)
Sodium: 140 meq/L (ref 135–145)
TSH: 1.13 microintl units/mL (ref 0.35–5.50)
Total Bilirubin: 1 mg/dL (ref 0.3–1.2)
Total Protein: 7 g/dL (ref 6.0–8.3)
WBC: 3.8 10*3/uL — ABNORMAL LOW (ref 4.5–10.5)

## 2009-03-18 ENCOUNTER — Encounter: Payer: Self-pay | Admitting: Internal Medicine

## 2009-05-06 ENCOUNTER — Encounter: Payer: Self-pay | Admitting: Internal Medicine

## 2009-06-03 ENCOUNTER — Encounter: Payer: Self-pay | Admitting: Internal Medicine

## 2009-06-09 ENCOUNTER — Ambulatory Visit: Payer: Self-pay | Admitting: Internal Medicine

## 2009-06-20 ENCOUNTER — Encounter: Payer: Self-pay | Admitting: Internal Medicine

## 2009-07-08 ENCOUNTER — Encounter: Admission: RE | Admit: 2009-07-08 | Discharge: 2009-07-08 | Payer: Self-pay | Admitting: Neurosurgery

## 2009-07-10 ENCOUNTER — Ambulatory Visit: Payer: Self-pay | Admitting: Internal Medicine

## 2009-07-10 LAB — CONVERTED CEMR LAB
ALT: 21 units/L (ref 0–53)
AST: 17 units/L (ref 0–37)
Albumin: 4.3 g/dL (ref 3.5–5.2)
Alkaline Phosphatase: 38 units/L — ABNORMAL LOW (ref 39–117)
BUN: 13 mg/dL (ref 6–23)
Basophils Absolute: 0 10*3/uL (ref 0.0–0.1)
Basophils Relative: 0.7 % (ref 0.0–3.0)
Bilirubin, Direct: 0 mg/dL (ref 0.0–0.3)
CO2: 29 meq/L (ref 19–32)
Calcium: 9.2 mg/dL (ref 8.4–10.5)
Chloride: 107 meq/L (ref 96–112)
Cholesterol: 103 mg/dL (ref 0–200)
Creatinine, Ser: 1 mg/dL (ref 0.4–1.5)
Eosinophils Absolute: 0.2 10*3/uL (ref 0.0–0.7)
Eosinophils Relative: 4.1 % (ref 0.0–5.0)
GFR calc non Af Amer: 104.3 mL/min (ref >60)
Glucose, Bld: 84 mg/dL (ref 70–99)
HCT: 43.4 % (ref 39.0–52.0)
HDL: 44.4 mg/dL (ref >39.00)
Hemoglobin: 14.2 g/dL (ref 13.0–17.0)
LDL Cholesterol: 48 mg/dL (ref 0–99)
Lymphocytes Relative: 31.4 % (ref 12.0–46.0)
Lymphs Abs: 1.2 10*3/uL (ref 0.7–4.0)
MCHC: 32.6 g/dL (ref 30.0–36.0)
MCV: 91.9 fL (ref 78.0–100.0)
Monocytes Absolute: 0.7 10*3/uL (ref 0.1–1.0)
Monocytes Relative: 17.8 % — ABNORMAL HIGH (ref 3.0–12.0)
Neutro Abs: 1.6 10*3/uL (ref 1.4–7.7)
Neutrophils Relative %: 46 % (ref 43.0–77.0)
Platelets: 280 10*3/uL (ref 150.0–400.0)
Potassium: 4.5 meq/L (ref 3.5–5.1)
RBC: 4.73 M/uL (ref 4.22–5.81)
RDW: 14.7 % — ABNORMAL HIGH (ref 11.5–14.6)
Sodium: 142 meq/L (ref 135–145)
TSH: 1.2 microintl units/mL (ref 0.35–5.50)
Total Bilirubin: 1.1 mg/dL (ref 0.3–1.2)
Total CHOL/HDL Ratio: 2
Total Protein: 7.2 g/dL (ref 6.0–8.3)
Triglycerides: 53 mg/dL (ref 0.0–149.0)
VLDL: 10.6 mg/dL (ref 0.0–40.0)
WBC: 3.7 10*3/uL — ABNORMAL LOW (ref 4.5–10.5)

## 2009-07-15 ENCOUNTER — Encounter (INDEPENDENT_AMBULATORY_CARE_PROVIDER_SITE_OTHER): Payer: Self-pay | Admitting: *Deleted

## 2009-07-18 ENCOUNTER — Ambulatory Visit (HOSPITAL_COMMUNITY): Admission: RE | Admit: 2009-07-18 | Discharge: 2009-07-18 | Payer: Self-pay | Admitting: Neurosurgery

## 2009-07-21 ENCOUNTER — Encounter (INDEPENDENT_AMBULATORY_CARE_PROVIDER_SITE_OTHER): Payer: Self-pay | Admitting: *Deleted

## 2009-07-28 ENCOUNTER — Encounter: Admission: RE | Admit: 2009-07-28 | Discharge: 2009-07-28 | Payer: Self-pay | Admitting: Sports Medicine

## 2009-08-06 ENCOUNTER — Encounter (INDEPENDENT_AMBULATORY_CARE_PROVIDER_SITE_OTHER): Payer: Self-pay | Admitting: *Deleted

## 2009-08-11 ENCOUNTER — Encounter (INDEPENDENT_AMBULATORY_CARE_PROVIDER_SITE_OTHER): Payer: Self-pay | Admitting: *Deleted

## 2009-09-01 ENCOUNTER — Encounter: Admission: RE | Admit: 2009-09-01 | Discharge: 2009-09-01 | Payer: Self-pay | Admitting: Sports Medicine

## 2009-09-10 ENCOUNTER — Encounter: Payer: Self-pay | Admitting: Internal Medicine

## 2009-09-17 ENCOUNTER — Encounter: Payer: Self-pay | Admitting: Internal Medicine

## 2009-10-02 ENCOUNTER — Encounter: Payer: Self-pay | Admitting: Internal Medicine

## 2009-10-07 ENCOUNTER — Encounter: Payer: Self-pay | Admitting: Internal Medicine

## 2009-10-08 ENCOUNTER — Ambulatory Visit: Payer: Self-pay | Admitting: Internal Medicine

## 2009-10-16 ENCOUNTER — Encounter: Payer: Self-pay | Admitting: Internal Medicine

## 2009-11-06 ENCOUNTER — Encounter: Payer: Self-pay | Admitting: Internal Medicine

## 2009-11-10 ENCOUNTER — Encounter: Payer: Self-pay | Admitting: Internal Medicine

## 2009-12-17 ENCOUNTER — Encounter: Payer: Self-pay | Admitting: Internal Medicine

## 2009-12-23 ENCOUNTER — Encounter: Admission: RE | Admit: 2009-12-23 | Discharge: 2009-12-23 | Payer: Self-pay | Admitting: Neurosurgery

## 2010-01-12 ENCOUNTER — Telehealth: Payer: Self-pay | Admitting: Internal Medicine

## 2010-01-13 ENCOUNTER — Encounter: Payer: Self-pay | Admitting: Internal Medicine

## 2010-02-27 ENCOUNTER — Encounter: Payer: Self-pay | Admitting: Internal Medicine

## 2010-03-03 ENCOUNTER — Encounter: Payer: Self-pay | Admitting: Internal Medicine

## 2010-03-23 ENCOUNTER — Ambulatory Visit (HOSPITAL_COMMUNITY): Admission: RE | Admit: 2010-03-23 | Discharge: 2010-03-24 | Payer: Self-pay | Admitting: Neurosurgery

## 2010-04-07 ENCOUNTER — Ambulatory Visit: Payer: Self-pay | Admitting: Internal Medicine

## 2010-04-07 DIAGNOSIS — Z87442 Personal history of urinary calculi: Secondary | ICD-10-CM | POA: Insufficient documentation

## 2010-04-07 HISTORY — DX: Personal history of urinary calculi: Z87.442

## 2010-04-07 LAB — CONVERTED CEMR LAB
ALT: 24 units/L (ref 0–53)
AST: 27 units/L (ref 0–37)
Albumin: 4.2 g/dL (ref 3.5–5.2)
Alkaline Phosphatase: 57 units/L (ref 39–117)
BUN: 10 mg/dL (ref 6–23)
Basophils Absolute: 0 10*3/uL (ref 0.0–0.1)
Basophils Relative: 0.4 % (ref 0.0–3.0)
Bilirubin, Direct: 0.1 mg/dL (ref 0.0–0.3)
CO2: 29 meq/L (ref 19–32)
Calcium: 9.6 mg/dL (ref 8.4–10.5)
Chloride: 112 meq/L (ref 96–112)
Cholesterol: 123 mg/dL (ref 0–200)
Creatinine, Ser: 1 mg/dL (ref 0.4–1.5)
Eosinophils Absolute: 0.2 10*3/uL (ref 0.0–0.7)
Eosinophils Relative: 4.7 % (ref 0.0–5.0)
GFR calc non Af Amer: 108.96 mL/min (ref >60)
Glucose, Bld: 71 mg/dL (ref 70–99)
HCT: 40.8 % (ref 39.0–52.0)
Hemoglobin: 13.8 g/dL (ref 13.0–17.0)
Hgb A1c MFr Bld: 5.7 % (ref 4.6–6.5)
Lymphocytes Relative: 21.7 % (ref 12.0–46.0)
Lymphs Abs: 1 10*3/uL (ref 0.7–4.0)
MCHC: 33.8 g/dL (ref 30.0–36.0)
MCV: 90.1 fL (ref 78.0–100.0)
Monocytes Absolute: 0.7 10*3/uL (ref 0.1–1.0)
Monocytes Relative: 15.8 % — ABNORMAL HIGH (ref 3.0–12.0)
Neutro Abs: 2.5 10*3/uL (ref 1.4–7.7)
Neutrophils Relative %: 57.4 % (ref 43.0–77.0)
Platelets: 354 10*3/uL (ref 150.0–400.0)
Potassium: 4.7 meq/L (ref 3.5–5.1)
RBC: 4.53 M/uL (ref 4.22–5.81)
RDW: 14.4 % (ref 11.5–14.6)
Sodium: 145 meq/L (ref 135–145)
TSH: 0.83 microintl units/mL (ref 0.35–5.50)
Total Bilirubin: 0.4 mg/dL (ref 0.3–1.2)
Total Protein: 7.1 g/dL (ref 6.0–8.3)
WBC: 4.4 10*3/uL — ABNORMAL LOW (ref 4.5–10.5)

## 2010-04-14 ENCOUNTER — Encounter: Payer: Self-pay | Admitting: Internal Medicine

## 2010-05-03 ENCOUNTER — Encounter: Payer: Self-pay | Admitting: Internal Medicine

## 2010-05-04 ENCOUNTER — Encounter: Payer: Self-pay | Admitting: Internal Medicine

## 2010-05-05 ENCOUNTER — Encounter: Payer: Self-pay | Admitting: Internal Medicine

## 2010-05-27 ENCOUNTER — Encounter: Payer: Self-pay | Admitting: Internal Medicine

## 2010-06-18 ENCOUNTER — Encounter: Payer: Self-pay | Admitting: Internal Medicine

## 2010-07-14 ENCOUNTER — Encounter: Payer: Self-pay | Admitting: Internal Medicine

## 2010-07-22 ENCOUNTER — Ambulatory Visit: Payer: Self-pay | Admitting: Internal Medicine

## 2010-07-22 DIAGNOSIS — IMO0002 Reserved for concepts with insufficient information to code with codable children: Secondary | ICD-10-CM | POA: Insufficient documentation

## 2010-07-23 ENCOUNTER — Telehealth: Payer: Self-pay | Admitting: Internal Medicine

## 2010-07-23 ENCOUNTER — Telehealth: Payer: Self-pay

## 2010-07-24 ENCOUNTER — Encounter: Payer: Self-pay | Admitting: Internal Medicine

## 2010-08-19 ENCOUNTER — Encounter: Payer: Self-pay | Admitting: Internal Medicine

## 2010-08-27 ENCOUNTER — Ambulatory Visit: Payer: Self-pay | Admitting: Internal Medicine

## 2010-08-27 LAB — CONVERTED CEMR LAB
BUN: 13 mg/dL (ref 6–23)
Basophils Absolute: 0 10*3/uL (ref 0.0–0.1)
Basophils Relative: 0.2 % (ref 0.0–3.0)
Blood Glucose, Fingerstick: 100
CO2: 28 meq/L (ref 19–32)
Calcium: 8.9 mg/dL (ref 8.4–10.5)
Chloride: 106 meq/L (ref 96–112)
Creatinine, Ser: 1.2 mg/dL (ref 0.4–1.5)
Eosinophils Absolute: 0.2 10*3/uL (ref 0.0–0.7)
Eosinophils Relative: 3.3 % (ref 0.0–5.0)
GFR calc non Af Amer: 80.95 mL/min (ref >60.00)
Glucose, Bld: 87 mg/dL (ref 70–99)
HCT: 28.4 % — ABNORMAL LOW (ref 39.0–52.0)
Hemoglobin: 9.7 g/dL — ABNORMAL LOW (ref 13.0–17.0)
Lymphocytes Relative: 8.9 % — ABNORMAL LOW (ref 12.0–46.0)
Lymphs Abs: 0.5 10*3/uL — ABNORMAL LOW (ref 0.7–4.0)
MCHC: 34.2 g/dL (ref 30.0–36.0)
MCV: 90.1 fL (ref 78.0–100.0)
Monocytes Absolute: 1.1 10*3/uL — ABNORMAL HIGH (ref 0.1–1.0)
Monocytes Relative: 18.6 % — ABNORMAL HIGH (ref 3.0–12.0)
Neutro Abs: 4.3 10*3/uL (ref 1.4–7.7)
Neutrophils Relative %: 69 % (ref 43.0–77.0)
Platelets: 382 10*3/uL (ref 150.0–400.0)
Potassium: 4.6 meq/L (ref 3.5–5.1)
RBC: 3.15 M/uL — ABNORMAL LOW (ref 4.22–5.81)
RDW: 14.5 % (ref 11.5–14.6)
Sodium: 140 meq/L (ref 135–145)
WBC: 6.2 10*3/uL (ref 4.5–10.5)

## 2010-09-09 ENCOUNTER — Encounter: Payer: Self-pay | Admitting: Internal Medicine

## 2010-09-29 ENCOUNTER — Telehealth: Payer: Self-pay | Admitting: Internal Medicine

## 2010-09-29 ENCOUNTER — Encounter: Payer: Self-pay | Admitting: Internal Medicine

## 2010-10-01 ENCOUNTER — Encounter: Payer: Self-pay | Admitting: Internal Medicine

## 2010-10-01 ENCOUNTER — Ambulatory Visit
Admission: RE | Admit: 2010-10-01 | Discharge: 2010-10-01 | Payer: Self-pay | Source: Home / Self Care | Attending: Internal Medicine | Admitting: Internal Medicine

## 2010-10-02 LAB — CONVERTED CEMR LAB
PSA, Free: <0.1 ng/mL
PSA: 0.12 ng/mL (ref <=4.00)

## 2010-10-06 NOTE — Letter (Signed)
[facility]   Imported By: Kassie Mends 07/14/2007 09:42:20  _____________________________________________________________________  External Attachment:    Type:   Image     Comment:   [facility]

## 2010-10-06 NOTE — Letter (Signed)
Summary: [facility]  [facility]   Imported By: Maryln Gottron 11/18/2009 13:59:30  _____________________________________________________________________  External Attachment:    Type:   Image     Comment:   External Document

## 2010-10-06 NOTE — Assessment & Plan Note (Signed)
Summary: ongoing congestion,lungs hurt, coughing up blood/nn   Vital Signs:  Patient Profile:   45 Years Old Male Weight:      268 pounds O2 Sat:      96 % Temp:     98.6 degrees F oral BP sitting:   128 / 90  (left arm) Cuff size:   large  Vitals Entered By: Raechel Ache, RN (June 01, 2007 11:30 AM) Oxygen therapy Room Air                 Chief Complaint:  C/o sore chest and cough..  History of Present Illness: 45 year old, black male, history of sarcoidosis.  He presents with a several day history of chest discomfort.  He has had similar symptoms in the past and has the thorough cardiac evaluation last month.  He had follow-up CT scan of the chest and was told that this suggested some pulmonary hypertension.  Denies any fever or productive cough  Current Allergies: No known allergies   Past Medical History:    Depression    Hypertension    obstructive sleep apnea    B12 deficiency    chronic low back pain    pulmonary sarcoidosis    GERD    Osteoarthritis  Past Surgical History:    Cervicothoracic decompression and fusin-1998    Partial Nephrectomy 2007    status post heart catheterization 2000     Review of Systems       negative treelike stress test February 2005 two.  The echocardiogram September 2006     Complete Medication List: 1)  Altace 5 Mg Caps (Ramipril) .... Take 1 capsule by mouth once a day 2)  Atenolol 100 Mg Tabs (Atenolol) .Marland Kitchen.. 1 two times a day 3)  Azathioprine 100 Mg Tabs (azathioprine)  .... Take 2 tablet by mouth once a day 4)  Catapres 0.1 Mg Tabs (Clonidine hcl) .... Take 1 tablet by mouth twice a day 5)  Cymbalta 30 Mg Cpep (Duloxetine hcl) .... 3 once daily 6)  Lipitor 40 Mg Tabs (Atorvastatin calcium) .... Take 1 tablet by mouth once a day 7)  Neurontin 300 Mg Caps (Gabapentin) .... 3 three times a day 8)  Oxycontin 30 Mg Tb12 (Oxycodone hcl) .... Take 9)  Prednisone 20 Mg Tabs (Prednisone) .Marland Kitchen.. 1 once a day 10)   Prevacid 30 Mg Cpdr (Lansoprazole) .... Take 1 capsule by mouth twice a day 11)  Wellbutrin Xl 300 Mg Tb24 (Bupropion hcl) .Marland Kitchen.. 1 once daily 12)  Wellbutrin Sr 150 Mg Tb12 (Bupropion hcl) .Marland Kitchen.. 1 once daily 13)  Flomax 0.4 Mg Cp24 (Tamsulosin hcl) .Marland Kitchen.. 1 once daily 14)  Ativan 1 Mg Tabs (Lorazepam) .... 2 q6h as needed     ]

## 2010-10-06 NOTE — Progress Notes (Signed)
  Phone Note Outgoing Call   Call placed by: Raechel Ache, RN,  December 20, 2007 10:41 AM Summary of Call: called re missed appt; he forgot.

## 2010-10-06 NOTE — Assessment & Plan Note (Signed)
Summary: 4 month rov/njr/PT RESCD FROM BUMP//CCM   Vital Signs:  Patient profile:   45 year old male Weight:      250 pounds Temp:     97.8 degrees F oral BP sitting:   122 / 86  (left arm) Cuff size:   large  Vitals Entered By: Raechel Ache, RN (March 13, 2009 11:01 AM)  CC:  4 mo ROV. Had back surgery in March- doing better.[name]  History of Present Illness: 45 year old patient seen today for follow-up.  He is followed by neurosurgery rheumatology and neurology.  He has a history of neurosarcoidosis and has had a recent lumbar laminectomy with fusion.  His low back pain has done much better.  Has a history of impaired glucose tolerance, dyslipidemia, and treated hypertension.  In general he seems to be doing fairly well  Problems Prior to Update: 1)  Impaired Glucose Tolerance  (ICD-271.3) 2)  Hyperlipidemia  (ICD-272.4) 3)  Uri  (ICD-465.9) 4)  Chest Pain  (ICD-786.50) 5)  Palpitations  (ICD-785.1) 6)  Osteoarthritis  (ICD-715.90) 7)  Gerd  (ICD-530.81) 8)  B12 Deficiency  (ICD-266.2) 9)  Esophagitis  (ICD-530.10) 10)  Cardiomyopathy, Idiopathic Hypertrophic  (ICD-425.4) 11)  Hypertension  (ICD-401.9) 12)  Depression  (ICD-311) 13)  Syndrome, Chronic Pain  (ICD-338.4) 14)  Hypercholesterolemia  (ICD-272.0) 15)  Sleep Apnea  (ICD-780.57) 16)  Neurosarcoidosis  (ICD-135)  Medications Prior to Update: 1)  Azasan 100 Mg  Tabs (Azathioprine) .... Take 1 Tablet By Mouth Two Times A Day 2)  Catapres 0.1 Mg Tabs (Clonidine Hcl) .... Take 1 Tablet By Mouth At Bedtime 3)  Cymbalta 30 Mg  Cpep (Duloxetine Hcl) .... Once Daily 4)  Lipitor 40 Mg Tabs (Atorvastatin Calcium) .... Take 1 Tablet By Mouth Once A Day 5)  Neurontin 300 Mg Caps (Gabapentin) .... 3 Four Times A Day 6)  Prednisone 5 Mg Tabs (Prednisone) .[name].. 1 Once A Day 7)  Wellbutrin Xl 300 Mg  Tb24 (Bupropion Hcl) .[name].. 1 Once Daily 8)  Wellbutrin Sr 150 Mg  Tb12 (Bupropion Hcl) .[name].. 1 Once Daily 9)  Flomax 0.4 Mg  Cp24  (Tamsulosin Hcl) .[name].. 1 Once Daily 10)  Restasis 0.05 %  Emul (Cyclosporine) .... Once Daily 11)  Tramadol Hcl 50 Mg  Tabs (Tramadol Hcl) .... One Every 6 Hrs For Pain 12)  Ascensia Contour Test   Strp (Glucose Blood) .... Use 5 Times Day 13)  Cyanocobalamin 1000 Mcg/ml Inj Soln (Cyanocobalamin) .... Uad 14)  Atenolol 50 Mg Tabs (Atenolol) .[name].. 1 Two Times A Day 15)  Cymbalta 60 Mg Cpep (Duloxetine Hcl) .... One Daily 16)  Nexium 40 Mg Cpdr (Esomeprazole Magnesium) .... One Daily  Allergies: No Known Drug Allergies  Past History:  Past Medical History: Reviewed history from 11/08/2008 and no changes required. Depression Hypertension obstructive sleep apnea B12 deficiency chronic low back pain pulmonary sarcoidosis GERD Osteoarthritis hypertrophic cardiomyopathy impaired glucose tolerance Hyperlipidemia  Past Surgical History: Cervicothoracic decompression and fusin-1998 Partial Nephrectomy 2007 status post heart catheterization 2000 Lumbar fusion March 2010  Family History: Reviewed history from 03/06/2007 and no changes required. Family History Other cancer- Prostate Fam hx Renal dz Fam hx CHF  Review of Systems  The patient denies anorexia, fever, weight loss, weight gain, vision loss, decreased hearing, hoarseness, chest pain, syncope, dyspnea on exertion, peripheral edema, prolonged cough, headaches, hemoptysis, abdominal pain, melena, hematochezia, severe indigestion/heartburn, hematuria, incontinence, genital sores, muscle weakness, suspicious skin lesions, transient blindness, difficulty walking, depression, unusual  weight change, abnormal bleeding, enlarged lymph nodes, angioedema, breast masses, and testicular masses.    Physical Exam  General:  overweight-appearing.  110/70overweight-appearing.   Head:  Normocephalic and atraumatic without obvious abnormalities. No apparent alopecia or balding. Eyes:  No corneal or conjunctival inflammation noted. EOMI.  Perrla. Funduscopic exam benign, without hemorrhages, exudates or papilledema. Vision grossly normal. Ears:  External ear exam shows no significant lesions or deformities.  Otoscopic examination reveals clear canals, tympanic membranes are intact bilaterally without bulging, retraction, inflammation or discharge. Hearing is grossly normal bilaterally. Mouth:  Oral mucosa and oropharynx without lesions or exudates.  Teeth in good repair. Neck:  No deformities, masses, or tenderness noted. Chest Wall:  No deformities, masses, tenderness or gynecomastia noted. Lungs:  Normal respiratory effort, chest expands symmetrically. Lungs are clear to auscultation, no crackles or wheezes. Heart:  Normal rate and regular rhythm. S1 and S2 normal without gallop, murmur, click, rub or other extra sounds. Abdomen:  Bowel sounds positive,abdomen soft and non-tender without masses, organomegaly or hernias noted. Msk:  well-healed laminectomy scar Pulses:  R and L carotid,radial,femoral,dorsalis pedis and posterior tibial pulses are full and equal bilaterally Extremities:  No clubbing, cyanosis, edema, or deformity noted with normal full range of motion of all joints.     Impression & Recommendations:  Problem # 1:  IMPAIRED GLUCOSE TOLERANCE (ICD-271.3)  Orders: Venipuncture (16109) TLB-BMP (Basic Metabolic Panel-BMET) (80048-METABOL) TLB-CBC Platelet - w/Differential (85025-CBCD) TLB-Hepatic/Liver Function Pnl (80076-HEPATIC) TLB-TSH (Thyroid Stimulating Hormone) (84443-TSH) TLB-A1C / Hgb A1C (Glycohemoglobin) (83036-A1C)  Problem # 2:  OSTEOARTHRITIS (ICD-715.90)  His updated medication list for this problem includes:    Tramadol Hcl 50 Mg Tabs (Tramadol hcl) ..... One every 6 hrs for pain  His updated medication list for this problem includes:    Tramadol Hcl 50 Mg Tabs (Tramadol hcl) ..... One every 6 hrs for pain  Problem # 3:  HYPERTENSION (ICD-401.9)  His updated medication list for this  problem includes:    Catapres 0.1 Mg Tabs (Clonidine hcl) .[name]... Take 1 tablet by mouth at bedtime    Atenolol 50 Mg Tabs (Atenolol) .[name]... 1 two times a day  Orders: Venipuncture (60454) TLB-BMP (Basic Metabolic Panel-BMET) (80048-METABOL) TLB-CBC Platelet - w/Differential (85025-CBCD) TLB-Hepatic/Liver Function Pnl (80076-HEPATIC) TLB-TSH (Thyroid Stimulating Hormone) (84443-TSH)  Problem # 4:  NEUROSARCOIDOSIS (ICD-135)  Orders: Venipuncture (09811) TLB-BMP (Basic Metabolic Panel-BMET) (80048-METABOL) TLB-CBC Platelet - w/Differential (85025-CBCD) TLB-Hepatic/Liver Function Pnl (80076-HEPATIC) TLB-TSH (Thyroid Stimulating Hormone) (84443-TSH)  Problem # 5:  SLEEP APNEA (ICD-780.57)  scheduled for a follow-up sleep study next month  Orders: Venipuncture (91478) TLB-BMP (Basic Metabolic Panel-BMET) (80048-METABOL) TLB-CBC Platelet - w/Differential (85025-CBCD) TLB-Hepatic/Liver Function Pnl (80076-HEPATIC) TLB-TSH (Thyroid Stimulating Hormone) (84443-TSH)  Orders: Venipuncture (29562) TLB-BMP (Basic Metabolic Panel-BMET) (80048-METABOL) TLB-CBC Platelet - w/Differential (85025-CBCD) TLB-Hepatic/Liver Function Pnl (80076-HEPATIC) TLB-TSH (Thyroid Stimulating Hormone) (84443-TSH)  Complete Medication List: 1)  Azasan 100 Mg Tabs (Azathioprine) .... Take 1 tablet by mouth two times a day 2)  Catapres 0.1 Mg Tabs (Clonidine hcl) .... Take 1 tablet by mouth at bedtime 3)  Cymbalta 30 Mg Cpep (Duloxetine hcl) .... Once daily 4)  Lipitor 40 Mg Tabs (Atorvastatin calcium) .... Take 1 tablet by mouth once a day 5)  Neurontin 300 Mg Caps (Gabapentin) .... 3 four times a day 6)  Prednisone 5 Mg Tabs (prednisone)  .[name].. 1 once a day 7)  Wellbutrin Xl 300 Mg Tb24 (Bupropion hcl) .[name].. 1 once daily 8)  Wellbutrin Sr 150 Mg Tb12 (Bupropion hcl) .John KitchenMarland KitchenMarland Ferguson 1  once daily 9)  Flomax 0.4 Mg Cp24 (Tamsulosin hcl) .[name].. 1 once daily 10)  Restasis 0.05 % Emul (Cyclosporine) .... Once daily 11)   Tramadol Hcl 50 Mg Tabs (Tramadol hcl) .... One every 6 hrs for pain 12)  Ascensia Contour Test Strp (Glucose blood) .... Use 5 times day 13)  Cyanocobalamin 1000 Mcg/ml Inj Soln (Cyanocobalamin) .... Uad 14)  Atenolol 50 Mg Tabs (Atenolol) .[name].. 1 two times a day 15)  Cymbalta 60 Mg Cpep (Duloxetine hcl) .... One daily 16)  Nexium 40 Mg Cpdr (Esomeprazole magnesium) .... One daily 17)  Valium 5 Mg Tabs (Diazepam) .[name].. 1 two times a day as needed  Other Orders: TLB-Cholesterol, Total (82465-CHO)  Patient Instructions: 1)  Please schedule a follow-up appointment in 4 months. 2)  Limit your Sodium (Salt). 3)  It is important that you exercise regularly at least 20 minutes 5 times a week. If you develop chest pain, have severe difficulty breathing, or feel very tired , stop exercising immediately and seek medical attention. 4)  You need to lose weight. Consider a lower calorie diet and regular exercise.  5)  Check your Blood Pressure regularly. If it is above: 140/90 you should make an appointment.

## 2010-10-06 NOTE — Assessment & Plan Note (Signed)
Summary: pain in chest s/p stress test/dm  Medications Added NEURONTIN 300 MG CAPS (GABAPENTIN) 3 four times a day RESTASIS 0.05 %  EMUL (CYCLOSPORINE) once daily        Vital Signs:  Patient Profile:   45 Years Old Male Weight:      267 pounds Temp:     98.5 degrees F Pulse rate:   68 / minute BP sitting:   128 / 90  (left arm)  Vitals Entered By: Gladis Riffle, RN (October 20, 2007 4:36 PM)                 Chief Complaint:  c/o sharp, stabbing painleft chest--had stress test today at [facility] and sx worsened, and told to call PCP.  History of Present Illness: Had stress test today because last few weeks he has had chest pain he developed a sharp stabbing chest discomfort while receiving adenosine. sxs are better now sxs worsen with twisting motion chronic SOB but not related to chespt pain.   Current Meds:  ALTACE 5 MG CAPS (RAMIPRIL) Take 1 capsule by mouth once a day ATENOLOL 100 MG TABS (ATENOLOL) 1 two times a day * AZATHIOPRINE 100  MG TABS (AZATHIOPRINE) Take 2 tablet by mouth once a day CATAPRES 0.1 MG TABS (CLONIDINE HCL) Take 1 tablet by mouth twice a day CYMBALTA 30 MG  CPEP (DULOXETINE HCL) 3 once daily LIPITOR 40 MG TABS (ATORVASTATIN CALCIUM) Take 1 tablet by mouth once a day NEURONTIN 300 MG CAPS (GABAPENTIN) 3 four times a day PREDNISONE 20 MG TABS (PREDNISONE) 1 once a day PREVACID 30 MG CPDR (LANSOPRAZOLE) Take 1 capsule by mouth twice a day WELLBUTRIN XL 300 MG  TB24 (BUPROPION HCL) 1 once daily WELLBUTRIN SR 150 MG  TB12 (BUPROPION HCL) 1 once daily FLOMAX 0.4 MG  CP24 (TAMSULOSIN HCL) 1 once daily ATIVAN 1 MG  TABS (LORAZEPAM) 2 q6h as needed PROMETHAZINE HCL 25 MG  TABS (PROMETHAZINE HCL) 1 q6h as needed RESTASIS 0.05 %  EMUL (CYCLOSPORINE) once daily  Social History: Married Alcohol use-no Drug use-no  Family History: Family History Other cancer- Prostate Fam hx Renal dz Fam hx CHF      Current Allergies (reviewed  today): No known allergies      Review of Systems       chronic fatige, sob---no change in months.    Physical Exam  General:     Well-developed,well-nourished,in no acute distress; alert,appropriate and cooperative throughout examination Head:     normocephalic and atraumatic.   Neck:     No deformities, masses, or tenderness noted. Chest Wall:     No deformities, masses, tenderness or gynecomastia noted. Lungs:     normal respiratory effort, no dullness, and no fremitus.   Heart:     Normal rate and regular rhythm. S1 and S2 normal without gallop, murmur, click, rub or other extra sounds.    Impression & Recommendations:  Problem # 1:  CHEST PAIN (ICD-786.50) Assessment: Improved discussed myoview with dr. Floydene Flock normal I don't think any further evaluation necesary at this time I have reviewed previous CT scan--negative PE  Complete Medication List: 1)  Altace 5 Mg Caps (Ramipril) .... Take 1 capsule by mouth once a day 2)  Atenolol 100 Mg Tabs (Atenolol) .Marland Kitchen.. 1 two times a day 3)  Azathioprine 100 Mg Tabs (azathioprine)  .... Take 2 tablet by mouth once a day 4)  Catapres 0.1 Mg Tabs (Clonidine hcl) .... Take 1 tablet by mouth  twice a day 5)  Cymbalta 30 Mg Cpep (Duloxetine hcl) .... 3 once daily 6)  Lipitor 40 Mg Tabs (Atorvastatin calcium) .... Take 1 tablet by mouth once a day 7)  Neurontin 300 Mg Caps (Gabapentin) .... 3 four times a day 8)  Prednisone 20 Mg Tabs (Prednisone) .Marland Kitchen.. 1 once a day 9)  Prevacid 30 Mg Cpdr (Lansoprazole) .... Take 1 capsule by mouth twice a day 10)  Wellbutrin Xl 300 Mg Tb24 (Bupropion hcl) .Marland Kitchen.. 1 once daily 11)  Wellbutrin Sr 150 Mg Tb12 (Bupropion hcl) .Marland Kitchen.. 1 once daily 12)  Flomax 0.4 Mg Cp24 (Tamsulosin hcl) .Marland Kitchen.. 1 once daily 13)  Ativan 1 Mg Tabs (Lorazepam) .... 2 q6h as needed 14)  Promethazine Hcl 25 Mg Tabs (Promethazine hcl) .Marland Kitchen.. 1 q6h as needed 15)  Restasis 0.05 % Emul (Cyclosporine) .... Once  daily     ]  CT of Chest  Procedure date:  10/13/2007  Findings:       IMPRESSION    1. No embolus identified.   2. Stable bilateral hilar adenopathy and stable small pulmonary   nodules   compatible with the patient's known sarcoidosis.    Read By:  Dellia Cloud,  M.D.   Released By:  Dellia Cloud,  M.D.  Additional Information  HL7 RESULT STATUS : F  External image : [id number]  External IF Update Timestamp : 2007-10-13:16:47:05.000000    CT of Chest  Procedure date:  10/13/2007  Findings:       IMPRESSION    1. No embolus identified.   2. Stable bilateral hilar adenopathy and stable small pulmonary   nodules   compatible with the patient's known sarcoidosis.    Read By:  Dellia Cloud,  M.D.   Released By:  Dellia Cloud,  M.D.  Additional Information  HL7 RESULT STATUS : F  External image : [id number]  External IF Update Timestamp : 2007-10-13:16:47:05.000000

## 2010-10-06 NOTE — Letter (Signed)
Summary: [facility]   Imported By: Maryln Gottron 09/13/2008 14:11:00  _____________________________________________________________________  External Attachment:    Type:   Image     Comment:   External Document

## 2010-10-06 NOTE — Procedures (Signed)
Summary: EGD/[facility]  EGD/[place] [facility]   Imported By: Maryln Gottron 10/10/2009 15:21:16  _____________________________________________________________________  External Attachment:    Type:   Image     Comment:   External Document

## 2010-10-06 NOTE — Assessment & Plan Note (Signed)
Summary: 3 MO ROA//VN   Vital Signs:  Patient Profile:   45 Years Old Male Weight:      270 pounds Temp:     98.4 degrees F oral BP sitting:   120 / 84  (left arm) Cuff size:   large  Vitals Entered By: Raechel Ache, RN (May 17, 2007 11:56 AM)                 Chief Complaint:  ROV. C/o L-sided pain x few months and vertigo. Had CT- shows pulm hypertension.Marland Kitchen  History of Present Illness: 45 year old, black male, history of pulmonary and neurosarcoidosis.  He has hypertension, dyslipidemia, and chronic pain syndrome.  He was seen by his neurologist recently and had a chest CT performed is adenopathy.  Apparently is stable and he is told that he had pulmonary hypertension.  He has had echocardiograms and heart catheterizations in the past, though a complaint of the includes fatigue is a nonspecific left chest wall and left upper abdominal pain  Current Allergies: No known allergies       Physical Exam  General:     overweight-appearing.   Head:     Normocephalic and atraumatic without obvious abnormalities. No apparent alopecia or balding. Mouth:     Oral mucosa and oropharynx without lesions or exudates.  Teeth in good repair. Neck:     No deformities, masses, or tenderness noted. Lungs:     Normal respiratory effort, chest expands symmetrically. Lungs are clear to auscultation, no crackles or wheezes.O2 saturation 94% Heart:     Normal rate and regular rhythm. S1 and S2 normal without gallop, murmur, click, rub or other extra sounds. Abdomen:     obese soft and nontender.  No organomegaly Msk:     No deformity or scoliosis noted of thoracic or lumbar spine.   Pulses:     R and L carotid,radial,femoral,dorsalis pedis and posterior tibial pulses are full and equal bilaterally Extremities:     No clubbing, cyanosis, edema, or deformity noted with normal full range of motion of all joints.      Impression & Recommendations:  Problem # 1:  HYPERTENSION  (ICD-401.9)  His updated medication list for this problem includes:    Altace 5 Mg Caps (Ramipril) .Marland Kitchen... Take 1 capsule by mouth once a day    Atenolol 100 Mg Tabs (Atenolol) .Marland Kitchen... 1 two times a day    Catapres 0.1 Mg Tabs (Clonidine hcl) .Marland Kitchen... Take 1 tablet by mouth twice a day   Problem # 2:  SYNDROME, CHRONIC PAIN (ICD-338.4)  Problem # 3:  HYPERCHOLESTEROLEMIA (ICD-272.0)  His updated medication list for this problem includes:    Lipitor 40 Mg Tabs (Atorvastatin calcium) .Marland Kitchen... Take 1 tablet by mouth once a day   Complete Medication List: 1)  Altace 5 Mg Caps (Ramipril) .... Take 1 capsule by mouth once a day 2)  Atenolol 100 Mg Tabs (Atenolol) .Marland Kitchen.. 1 two times a day 3)  Azathioprine 100 Mg Tabs (azathioprine)  .... Take 2 tablet by mouth once a day 4)  Catapres 0.1 Mg Tabs (Clonidine hcl) .... Take 1 tablet by mouth twice a day 5)  Cymbalta 30 Mg Cpep (Duloxetine hcl) .... 3 once daily 6)  Lipitor 40 Mg Tabs (Atorvastatin calcium) .... Take 1 tablet by mouth once a day 7)  Neurontin 300 Mg Caps (Gabapentin) .... 3 three times a day 8)  Oxycontin 30 Mg Tb12 (Oxycodone hcl) .... Take 9)  Prednisone 20 Mg  Tabs (Prednisone) .Marland Kitchen.. 1 once a day 10)  Prevacid 30 Mg Cpdr (Lansoprazole) .... Take 1 capsule by mouth twice a day 11)  Wellbutrin Xl 300 Mg Tb24 (Bupropion hcl) .Marland Kitchen.. 1 once daily 12)  Wellbutrin Sr 150 Mg Tb12 (Bupropion hcl) .Marland Kitchen.. 1 once daily 13)  Flomax 0.4 Mg Cp24 (Tamsulosin hcl) .Marland Kitchen.. 1 once daily 14)  Ativan 1 Mg Tabs (Lorazepam) .... 2 q6h as needed   Patient Instructions: 1)  Please schedule a follow-up appointment in 4 months. 2)  Limit your Sodium (Salt) to less than 4 grams a day (slightly less than 1 teaspoon) to prevent fluid retention, swelling, or worsening or symptoms. 3)  It is important that you exercise regularly at least 20 minutes 5 times a week. If you develop chest pain, have severe difficulty breathing, or feel very tired , stop exercising immediately  and seek medical attention. 4)  You need to lose weight. Consider a lower calorie diet and regular exercise.     ]

## 2010-10-06 NOTE — Progress Notes (Signed)
Summary: erx's questions   Phone Note From Pharmacy   Caller: [facility] [address] Summary of Call: questions reguarding erx's sent - please call [phone] Initial call taken by: Duard Brady LPN,  July 23, 2010 9:12 AM  Follow-up for Phone Call        called to clarify rx's   kik Follow-up by: Duard Brady LPN,  July 23, 2010 9:16 AM

## 2010-10-06 NOTE — Progress Notes (Signed)
Summary: SAMPLES   Phone Note Call from Patient Call back at [phone]    Caller: Patient-VOICE MAIL Call For: DR K Summary of Call: PT WOULD LIKE SAMPLES OF Kandis Fantasia AND ALTACE UNTIL MEDS GET APPROVED THROUGH PATIENT ASSISTANCE. Initial call taken by: Warnell Forester,  October 19, 2007 2:27 PM  Follow-up for Phone Call        Phone Call Completed samples not available. Follow-up by: Raechel Ache, RN,  October 20, 2007 9:59 AM

## 2010-10-06 NOTE — Letter (Signed)
Summary: [facility]  [facility] Latasia Silberstein: This Drystan Reader was preselected by the workflow.  Signature: The signature status of this document was preset by the workflow  Processed by [employer] Local Indexer Client @ Wednesday, August 27, 2009 11:16:27 AM using version:2010.1.2.11(2.4)   Manually Indexed By: [id number]  idlBatchDetail: [id number]   _____________________________________________________________________  External Attachment:    Type:   Image     Comment:   External Document

## 2010-10-06 NOTE — Miscellaneous (Signed)
  Medications Added CYANOCOBALAMIN 1000 MCG/ML INJ SOLN (CYANOCOBALAMIN) UAD       Clinical Lists Changes  Medications: Added new medication of CYANOCOBALAMIN 1000 MCG/ML INJ SOLN (CYANOCOBALAMIN) UAD - Signed Rx of CYANOCOBALAMIN 1000 MCG/ML INJ SOLN (CYANOCOBALAMIN) UAD;  #30 x 10;  Signed;  Entered by: Raechel Ache, RN;  Authorized by: Gordy Savers  MD;  Method used: Historical    Prescriptions: CYANOCOBALAMIN 1000 MCG/ML INJ SOLN (CYANOCOBALAMIN) UAD  #30 x 10   Entered by:   Raechel Ache, RN   Authorized by:   Gordy Savers  MD   Signed by:   Raechel Ache, RN on 02/29/2008   Method used:   Historical   RxID:   [id number]

## 2010-10-06 NOTE — Letter (Signed)
Summary: [facility]  [facility]   Imported By: Maryln Gottron 11/25/2009 12:13:43  _____________________________________________________________________  External Attachment:    Type:   Image     Comment:   External Document

## 2010-10-06 NOTE — Assessment & Plan Note (Signed)
Summary: 6 month rov/njr   Vital Signs:  Patient profile:   45 year old male Weight:      237 pounds Temp:     98.3 degrees F oral BP sitting:   120 / 80  (right arm) Cuff size:   regular  Vitals Entered By: Duard Brady LPN (April 07, 2010 8:42 AM) CC: 6 mos rov - doing ok   CC:  6 mos rov - doing ok.  History of Present Illness: 35  -year-old patient seen today for follow-up of his multiple medical problems.He is followed at [facility] by multiple consultants.  He has a history neurosarcoid.  He has osteoarthritis dyslipidemia, and history of obstructive sleep apnea.  He has a chronic pain syndrome.  He has a history of depression and hypertension.  He is also seen a nephrologist for renal stone disease.  He has a history of impaired glucose tolerance.  There has been some modest weight loss over the past 6 months.  Medical regiment includes azathioprine  Allergies (verified): No Known Drug Allergies  Past History:  Past Medical History: Depression Hypertension obstructive sleep apnea B12 deficiency chronic low back pain pulmonary sarcoidosis GERD Osteoarthritis hypertrophic cardiomyopathy impaired glucose tolerance Hyperlipidemia status post nephrectomy for left renal cancer Nephrolithiasis, hx of  Review of Systems       The patient complains of depression.  The patient denies anorexia, fever, weight loss, weight gain, vision loss, decreased hearing, hoarseness, chest pain, syncope, dyspnea on exertion, peripheral edema, prolonged cough, headaches, hemoptysis, abdominal pain, melena, hematochezia, severe indigestion/heartburn, hematuria, incontinence, genital sores, muscle weakness, suspicious skin lesions, transient blindness, difficulty walking, unusual weight change, abnormal bleeding, enlarged lymph nodes, angioedema, breast masses, and testicular masses.    Physical Exam  General:  overweight-appearing.  122/82overweight-appearing.   Head:   Normocephalic and atraumatic without obvious abnormalities. No apparent alopecia or balding. Eyes:  No corneal or conjunctival inflammation noted. EOMI. Perrla. Funduscopic exam benign, without hemorrhages, exudates or papilledema. Vision grossly normal. Mouth:  Oral mucosa and oropharynx without lesions or exudates.  Teeth in good repair. Neck:  No deformities, masses, or tenderness noted. Lungs:  Normal respiratory effort, chest expands symmetrically. Lungs are clear to auscultation, no crackles or wheezes. Heart:  Normal rate and regular rhythm. S1 and S2 normal without gallop, murmur, click, rub or other extra sounds. Abdomen:  Bowel sounds positive,abdomen soft and non-tender without masses, organomegaly or hernias noted. Msk:  No deformity or scoliosis noted of thoracic or lumbar spine.   Pulses:  R and L carotid,radial,femoral,dorsalis pedis and posterior tibial pulses are full and equal bilaterally Extremities:  No clubbing, cyanosis, edema, or deformity noted with normal full range of motion of all joints.   Skin:  Intact without suspicious lesions or rashes   Impression & Recommendations:  Problem # 1:  IMPAIRED GLUCOSE TOLERANCE (ICD-271.3)  Orders: TLB-A1C / Hgb A1C (Glycohemoglobin) (83036-A1C)  Problem # 2:  HYPERLIPIDEMIA (ICD-272.4)  His updated medication list for this problem includes:    Lipitor 40 Mg Tabs (Atorvastatin calcium) .Marland Kitchen... Take 1 tablet by mouth once a day  His updated medication list for this problem includes:    Lipitor 40 Mg Tabs (Atorvastatin calcium) .Marland Kitchen... Take 1 tablet by mouth once a day  Orders: Venipuncture (84132) TLB-BMP (Basic Metabolic Panel-BMET) (80048-METABOL) TLB-CBC Platelet - w/Differential (85025-CBCD) TLB-Hepatic/Liver Function Pnl (80076-HEPATIC) TLB-TSH (Thyroid Stimulating Hormone) (84443-TSH) TLB-Cholesterol, Total (82465-CHO)  Problem # 3:  OSTEOARTHRITIS (ICD-715.90)  His updated medication list for this problem  includes:    Tramadol Hcl 50 Mg Tabs (Tramadol hcl) ..... One every 6 hrs for pain  His updated medication list for this problem includes:    Tramadol Hcl 50 Mg Tabs (Tramadol hcl) ..... 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 Take 1 tablet by mouth at bedtime    Atenolol 50 Mg Tabs (Atenolol) .Marland Kitchen... 1 two times a day  His updated medication list for this problem includes:    Catapres 0.1 Mg Tabs (Clonidine hcl) .Marland Kitchen... Take 1 tablet by mouth at bedtime    Atenolol 50 Mg Tabs (Atenolol) .Marland Kitchen... 1 two times a day  Orders: Venipuncture (60454) TLB-BMP (Basic Metabolic Panel-BMET) (80048-METABOL) TLB-CBC Platelet - w/Differential (85025-CBCD) TLB-Hepatic/Liver Function Pnl (80076-HEPATIC) TLB-TSH (Thyroid Stimulating Hormone) (84443-TSH)  Problem # 6:  DEPRESSION (ICD-311)  His updated medication list for this problem includes:    Cymbalta 30 Mg Cpep (Duloxetine hcl) ..... Once daily    Wellbutrin Xl 300 Mg Tb24 (Bupropion hcl) .Marland Kitchen... 1 once daily    Wellbutrin Sr 150 Mg Tb12 (Bupropion hcl) .Marland Kitchen... 1 once daily    Cymbalta 60 Mg Cpep (Duloxetine hcl) ..... One daily    Valium 5 Mg Tabs (Diazepam) .Marland Kitchen... 1 two times a day as needed  His updated medication list for this problem includes:    Cymbalta 30 Mg Cpep (Duloxetine hcl) ..... Once daily    Wellbutrin Xl 300 Mg Tb24 (Bupropion hcl) .Marland Kitchen... 1 once daily    Wellbutrin Sr 150 Mg Tb12 (Bupropion hcl) .Marland Kitchen... 1 once daily    Cymbalta 60 Mg Cpep (Duloxetine hcl) ..... One daily    Valium 5 Mg Tabs (Diazepam) .Marland Kitchen... 1 two  times a day as needed  Complete Medication List: 1)  Azasan 100 Mg Tabs (Azathioprine) .... Take 1 tablet by mouth two times a day 2)  Catapres 0.1 Mg Tabs (Clonidine hcl) .... Take 1 tablet by mouth at bedtime 3)  Cymbalta 30 Mg Cpep (Duloxetine hcl) .... Once daily 4)  Lipitor 40 Mg Tabs (Atorvastatin calcium) .... Take 1 tablet by mouth once a day 5)  Neurontin 300 Mg Caps (Gabapentin) .... 3 four times a day 6)  Prednisone 5 Mg Tabs (prednisone)  .Marland Kitchen.. 1 once a day 7)  Wellbutrin Xl 300 Mg Tb24 (Bupropion hcl) .Marland Kitchen.. 1 once daily 8)  Wellbutrin Sr 150 Mg Tb12 (Bupropion hcl) .Marland Kitchen.. 1 once daily 9)  Flomax 0.4 Mg Cp24 (Tamsulosin hcl) .Marland Kitchen.. 1 once daily 10)  Restasis 0.05 % Emul (Cyclosporine) .... Once daily 11)  Tramadol Hcl 50 Mg Tabs (Tramadol hcl) .... One every 6 hrs for pain 12)  Ascensia Contour Test Strp (Glucose blood) .... Use 5 times day 13)  Cyanocobalamin 1000 Mcg/ml Inj Soln (Cyanocobalamin) .... Uad 14)  Atenolol 50 Mg Tabs (Atenolol) .Marland Kitchen.. 1 two times a day 15)  Cymbalta 60 Mg Cpep (Duloxetine hcl) .... One daily 16)  Nexium 40 Mg Cpdr (Esomeprazole magnesium) .... One daily 17)  Valium 5 Mg Tabs (Diazepam) .Marland Kitchen.. 1 two times a day as needed 18)  Promethazine Hcl 25 Mg Tabs (Promethazine hcl) .Marland Kitchen.. 1 q6h as needed nausea 19)  Oxybutynin Chloride 5 Mg Xr24h-tab (Oxybutynin chloride) .Marland KitchenMarland KitchenMarland Kitchen  1 two times a day  Patient Instructions: 1)  Please schedule a follow-up appointment in 6 months. 2)  Limit your Sodium (Salt). 3)  It is important that you exercise regularly at least 20 minutes 5 times a week. If you develop chest pain, have severe difficulty breathing, or feel very tired , stop exercising immediately and seek medical attention. 4)  You need to lose weight. Consider a lower calorie diet and regular exercise.  5)  Check your blood sugars regularly. If your readings are usually above : or below 70 you should contact our office.  Appended Document: Orders  Update     Clinical Lists Changes  Orders: Added new Service order of Specimen Handling (02725) - Signed

## 2010-10-06 NOTE — Letter (Signed)
Summary: [facility]  [facility]   Imported By: Maryln Gottron 10/29/2009 10:41:18  _____________________________________________________________________  External Attachment:    Type:   Image     Comment:   External Document

## 2010-10-06 NOTE — Letter (Signed)
[facility]   Imported By: Kassie Mends 07/28/2007 09:46:35  _____________________________________________________________________  External Attachment:    Type:   Image     Comment:   [facility]

## 2010-10-06 NOTE — Letter (Signed)
Summary: [facility]-Addendum added to report  [facility]Addendum added to report   Imported By: Maryln Gottron 06/13/2009 10:03:08  _____________________________________________________________________  External Attachment:    Type:   Image     Comment:   External Document

## 2010-10-06 NOTE — Letter (Signed)
Summary: [facility]  [facility] [name]: This [name] was preselected by the workflow.  Signature: The signature status of this document was preset by the workflow  Processed by [employer] Local Indexer Client @ Thursday, August 14, 2009 9:17:31 AM using version:2010.1.2.11(2.4)   Manually Indexed By: [id number]  idlBatchDetail: [id number]   _____________________________________________________________________  External Attachment:    Type:   Image     Comment:   External Document

## 2010-10-06 NOTE — Letter (Signed)
Summary: [facility]  [facility]   Imported By: Maryln Gottron 05/15/2009 10:44:53  _____________________________________________________________________  External Attachment:    Type:   Image     Comment:   External Document

## 2010-10-06 NOTE — Letter (Signed)
Summary: [facility]  [facility]   Imported By: Maryln Gottron 01/22/2009 09:04:27  _____________________________________________________________________  External Attachment:    Type:   Image     Comment:   External Document

## 2010-10-06 NOTE — Progress Notes (Signed)
Summary: refill azasan  Phone Note Refill Request Message from:  Fax from Pharmacy on Jan 12, 2010 3:32 PM  Refills Requested: Medication #1:  AZASAN 100 MG  TABS Take 1 tablet by mouth two times a day [BMN]   Last Refilled: 01/06/2010 [facility] [place] , ph [phone]   Method Requested: Fax to Local Pharmacy Initial call taken by: Duard Brady LPN,  Jan 13, 7252 3:33 PM    Prescriptions: AZASAN 100 MG  TABS (AZATHIOPRINE) Take 1 tablet by mouth two times a day Brand medically necessary #180 x 3   Entered by:   Duard Brady LPN   Authorized by:   Gordy Savers  MD   Signed by:   Duard Brady LPN on 66/44/0347   Method used:   Faxed to ...       [facility] (retail)       [address]       [place], [place]  [address]       Ph: [phone] or [phone]       Fax: [phone]   RxID:   [id number] [id number]  faxed to [facility] [place]

## 2010-10-06 NOTE — Consult Note (Signed)
Summary: [facility]  [facility]   Imported By: Maryln Gottron 06/10/2008 15:02:43  _____________________________________________________________________  External Attachment:    Type:   Image     Comment:   External Document

## 2010-10-06 NOTE — Progress Notes (Signed)
Summary: stop aspirin prior to surgery   Phone Note From Other Clinic   Caller: dr Ophelia Charter Call For: k  Summary of Call: the patient was to stop his aspirin on 1-21 prior to his surgery  Initial call taken by: Roselle Locus,  September 30, 2008 2:24 PM

## 2010-10-06 NOTE — Letter (Signed)
Summary: [facility]  [facility] Provider: This provider was preselected by the workflow.  Signature: The signature status of this document was preset by the workflow  Processed by [employer] Local Indexer Client @ Thursday, July 24, 2009 10:26:17 AM using version:2010.1.2.11(2.4)   Manually Indexed By: [id number]  idlBatchDetail: [id number]   _____________________________________________________________________  External Attachment:    Type:   Image     Comment:   External Document

## 2010-10-06 NOTE — Letter (Signed)
Summary: [facility]  [facility]   Imported By: Maryln Gottron 05/01/2010 13:22:14  _____________________________________________________________________  External Attachment:    Type:   Image     Comment:   External Document

## 2010-10-06 NOTE — Progress Notes (Signed)
Summary: hospice referral -per pt  Phone Note Call from Patient   Caller: Julieanne Cotton with Hospice Reason for Call: Talk to Doctor Summary of Call: pt call per self requesting hospice referral - please call to discuss.  [phone] Initial call taken by: Duard Brady LPN,  July 23, 2010 12:00 PM  Follow-up for Phone Call        please advise Follow-up by: Duard Brady LPN,  July 23, 2010 12:01 PM  Additional Follow-up for Phone Call Additional follow up Details #1::        does not qualify for hospice-he is 45 y/o and not terminal Additional Follow-up by: Gordy Savers  MD,  July 23, 2010 12:47 PM    Additional Follow-up for Phone Call Additional follow up Details #2::    called [facility] - gave Dr. Vergia Alberts instruction. KIK Follow-up by: Duard Brady LPN,  July 23, 2010 1:18 PM

## 2010-10-06 NOTE — Letter (Signed)
Summary: [facility]  [facility]   Imported By: Maryln Gottron 10/10/2009 15:23:43  _____________________________________________________________________  External Attachment:    Type:   Image     Comment:   External Document

## 2010-10-06 NOTE — Letter (Signed)
Summary: [facility]   Imported By: Sherian Rein 05/13/2010 07:38:06  _____________________________________________________________________  External Attachment:    Type:   Image     Comment:   External Document

## 2010-10-06 NOTE — Letter (Signed)
Summary: [facility]  [facility]   Imported By: Maryln Gottron 07/02/2010 14:32:51  _____________________________________________________________________  External Attachment:    Type:   Image     Comment:   External Document

## 2010-10-06 NOTE — Assessment & Plan Note (Signed)
Summary: not feeling well//ccm   Vital Signs:  Patient profile:   45 year old male Weight:      237 pounds Temp:     97.8 degrees F oral BP sitting:   120 / 86  (left arm) Cuff size:   regular  Vitals Entered By: Duard Brady LPN (July 22, 2010 10:25 AM) CC: concerned about (R) low leg 'bite?' "wound?" Is Patient Diabetic? No   CC:  concerned about (R) low leg 'bite?' "wound?".  History of Present Illness: a 44 year old patient who is in today for follow-up.  He has multiple medical problems including hypertension, depression, impaired glucose tolerance.  Exogenous obesity.  He has been recently diagnosed  with prostate cancer and is scheduled for surgery at [facility] next month.  One week ago he noted a wound involving his right medial ankle region.  This has become slightly red and tender.  He has been applying peroxide and in the area seems to be improving.  He was concerned about  a  skin and soft tissue infectionperiod  Allergies (verified): No Known Drug Allergies  Past History:  Past Medical History: Depression Hypertension obstructive sleep apnea B12 deficiency chronic low back pain pulmonary sarcoidosis GERD Osteoarthritis hypertrophic cardiomyopathy impaired glucose tolerance Hyperlipidemia status post nephrectomy for left renal cancer Nephrolithiasis, hx of prostate cancer  Past Surgical History: Cervicothoracic decompression and fusion-1998 Partial Nephrectomy 2007 status post heart catheterization 2000 Lumbar fusion March 2010 robotic prostatectomy, [facility] December 2011  Review of Systems       The patient complains of suspicious skin lesions.  The patient denies anorexia, fever, weight loss, weight gain, vision loss, decreased hearing, hoarseness, chest pain, syncope, dyspnea on exertion, peripheral edema, prolonged cough, headaches, hemoptysis, abdominal pain, melena, hematochezia, severe indigestion/heartburn, hematuria,  incontinence, genital sores, muscle weakness, transient blindness, difficulty walking, depression, unusual weight change, abnormal bleeding, enlarged lymph nodes, angioedema, breast masses, and testicular masses.    Physical Exam  General:  overweight-appearing.  120/76 Eyes:  No corneal or conjunctival inflammation noted. EOMI. Perrla. Funduscopic exam benign, without hemorrhages, exudates or papilledema. Vision grossly normal. Mouth:  Oral mucosa and oropharynx without lesions or exudates.  Teeth in good repair. Neck:  No deformities, masses, or tenderness noted. Lungs:  Normal respiratory effort, chest expands symmetrically. Lungs are clear to auscultation, no crackles or wheezes. Heart:  Normal rate and regular rhythm. S1 and S2 normal without gallop, murmur, click, rub or other extra sounds. Abdomen:  Bowel sounds positive,abdomen soft and non-tender without masses, organomegaly or hernias noted. Msk:  No deformity or scoliosis noted of thoracic or lumbar spine.   Pulses:  R and L carotid,radial,femoral,dorsalis pedis and posterior tibial pulses are full and equal bilaterally Extremities:  No clubbing, cyanosis, edema, or deformity noted with normal full range of motion of all joints.   Skin:  a 1 cm crusted lesion was noted involving the right medial lower leg just proximal to the ankle.  There is some minimal surrounding edema.  Area is slightly tender   Impression & Recommendations:  Problem # 1:  ABRASION, FOOT (ICD-917.0)  Problem # 2:  IMPAIRED GLUCOSE TOLERANCE (ICD-271.3)  Problem # 3:  HYPERTENSION (ICD-401.9)  His updated medication list for this problem includes:    Catapres 0.1 Mg Tabs (Clonidine hcl) .Marland Kitchen... Take 1 tablet by mouth at bedtime    Atenolol 50 Mg Tabs (Atenolol) .Marland Kitchen... 1 two times a day  Problem # 4:  DEPRESSION (ICD-311)  His updated medication list  for this problem includes:    Cymbalta 30 Mg Cpep (Duloxetine hcl) ..... Once daily    Wellbutrin Xl 300 Mg  Tb24 (Bupropion hcl) .Marland Kitchen... 1 once daily    Wellbutrin Sr 150 Mg Tb12 (Bupropion hcl) .Marland Kitchen... 1 once daily    Cymbalta 60 Mg Cpep (Duloxetine hcl) ..... One daily    Valium 5 Mg Tabs (Diazepam) .Marland Kitchen... 1 two times a day as needed  Complete Medication List: 1)  Azasan 100 Mg Tabs (Azathioprine) .... Take 1 tablet by mouth two times a day 2)  Catapres 0.1 Mg Tabs (Clonidine hcl) .... Take 1 tablet by mouth at bedtime 3)  Cymbalta 30 Mg Cpep (Duloxetine hcl) .... Once daily 4)  Lipitor 40 Mg Tabs (Atorvastatin calcium) .... Take 1 tablet by mouth once a day 5)  Neurontin 300 Mg Caps (Gabapentin) .... 3 four times a day 6)  Prednisone 5 Mg Tabs (prednisone)  .Marland Kitchen.. 1 once a day 7)  Wellbutrin Xl 300 Mg Tb24 (Bupropion hcl) .Marland Kitchen.. 1 once daily 8)  Wellbutrin Sr 150 Mg Tb12 (Bupropion hcl) .Marland Kitchen.. 1 once daily 9)  Flomax 0.4 Mg Cp24 (Tamsulosin hcl) .Marland Kitchen.. 1 once daily 10)  Restasis 0.05 % Emul (Cyclosporine) .... Once daily 11)  Tramadol Hcl 50 Mg Tabs (Tramadol hcl) .... One every 6 hrs for pain 12)  Ascensia Contour Test Strp (Glucose blood) .... Use 5 times day 13)  Cyanocobalamin 1000 Mcg/ml Inj Soln (Cyanocobalamin) .... Uad 14)  Atenolol 50 Mg Tabs (Atenolol) .Marland Kitchen.. 1 two times a day 15)  Cymbalta 60 Mg Cpep (Duloxetine hcl) .... One daily 16)  Nexium 40 Mg Cpdr (Esomeprazole magnesium) .... One daily 17)  Valium 5 Mg Tabs (Diazepam) .Marland Kitchen.. 1 two times a day as needed 18)  Promethazine Hcl 25 Mg Tabs (Promethazine hcl) .Marland Kitchen.. 1 q6h as needed nausea 19)  Oxybutynin Chloride 5 Mg Xr24h-tab (Oxybutynin chloride) .Marland Kitchen.. 1 two times a day 20)  Contour Usb Blood Glucose Sys W/device Kit (Blood glucose monitoring suppl) .... Use daily to check your blood sugar 21)  Bentyl 10 Mg Caps (Dicyclomine hcl) .Marland Kitchen.. 1 by mouth 4 times daily  Patient Instructions: 1)  Please schedule a follow-up appointment in 6 months. 2)  Limit your Sodium (Salt). 3)  It is important that you exercise regularly at least 20 minutes 5 times  a week. If you develop chest pain, have severe difficulty breathing, or feel very tired , stop exercising immediately and seek medical attention. 4)  You need to lose weight. Consider a lower calorie diet and regular exercise.  5)  Check your blood sugars regularly. If your readings are usually above : or below 70 you should contact our office. 6)  It is important that your Diabetic A1c level is checked every 3 months. Prescriptions: CONTOUR USB BLOOD GLUCOSE SYS W/DEVICE KIT (BLOOD GLUCOSE MONITORING SUPPL) Use daily to check your blood sugar  #1 x 0   Entered and Authorized by:   Gordy Savers  MD   Signed by:   Gordy Savers  MD on 07/22/2010   Method used:   Electronically to        [facility] [address] (retail)       [address]       [place], [place]  [address]       Ph: [phone] or [phone]       Fax: [phone]   RxID:  [id number] NEXIUM 40 MG CPDR (ESOMEPRAZOLE MAGNESIUM) one daily Brand medically necessary #90 Capsule x 2   Entered and Authorized by:   Gordy Savers  MD   Signed by:   Gordy Savers  MD on 07/22/2010   Method used:   Electronically to        [facility] [address] (retail)       [address]       [place], [place]  [address]       Ph: [phone] or [phone]       Fax: [phone]   RxID:   [id number] CYMBALTA 60 MG CPEP (DULOXETINE HCL) one daily  #90 x 6   Entered and Authorized by:   Gordy Savers  MD   Signed by:   Gordy Savers  MD on 07/22/2010   Method used:   Electronically to        [facility] [address] (retail)       [address]       [place], [place]  [address]       Ph: [phone] or [phone]       Fax: [phone]   RxID:   [id number] ATENOLOL 50 MG TABS (ATENOLOL) 1 two times a day  #180 x 6   Entered and Authorized by:   Gordy Savers   MD   Signed by:   Gordy Savers  MD on 07/22/2010   Method used:   Electronically to        [facility] [address] (retail)       [address]       [place], [place]  [address]       Ph: [phone] or [phone]       Fax: [phone]   RxID:   [id number] CYANOCOBALAMIN 1000 MCG/ML INJ SOLN (CYANOCOBALAMIN) UAD  #30 [place] x 10   Entered and Authorized by:   Gordy Savers  MD   Signed by:   Gordy Savers  MD on 07/22/2010   Method used:   Electronically to        [facility] [address] (retail)       [address]       [place], [place]  [address]       Ph: [phone] or [phone]       Fax: [phone]   RxID:   [id number] ASCENSIA CONTOUR TEST   STRP (GLUCOSE BLOOD) use 5 times day  #150 x 11   Entered and Authorized by:   Gordy Savers  MD   Signed by:   Gordy Savers  MD on 07/22/2010   Method used:   Electronically to        [facility] [address] (retail)       [address]       [place], [place]  [address]       Ph: [phone] or [phone]       Fax: [phone]   RxID:   [id number] FLOMAX 0.4 MG  CP24 (TAMSULOSIN HCL) 1 once daily  #90 x 6   Entered and Authorized by:   Gordy Savers  MD  Signed by:   Gordy Savers  MD on 07/22/2010   Method used:   Electronically to        [facility] [address] (retail)       [address]       [place], [place]  [address]       Ph: [phone] or [phone]       Fax: [phone]   RxID:   [id number] WELLBUTRIN SR 150 MG  TB12 (BUPROPION HCL) 1 once daily  #90 x 6   Entered and Authorized by:   Gordy Savers  MD   Signed by:   Gordy Savers  MD on 07/22/2010   Method used:   Electronically to        [facility] [address] [address] (retail)       [address]       [place], [place]  [address]       Ph: [phone] or [phone]       Fax: [phone]   RxID:   [id number] WELLBUTRIN XL 300 MG  TB24 (BUPROPION HCL) 1 once daily  #90 x 2   Entered and Authorized by:   Gordy Savers  MD   Signed by:   Gordy Savers  MD on 07/22/2010   Method used:   Electronically to        [facility] [address] (retail)       [address]       [place], [place]  [address]       Ph: [phone] or [phone]       Fax: [phone]   RxID:   [id number] NEURONTIN 300 MG CAPS (GABAPENTIN) 3 four times a day  #270 Capsule x 0   Entered and Authorized by:   Gordy Savers  MD   Signed by:   Gordy Savers  MD on 07/22/2010   Method used:   Electronically to        [facility] [address] (retail)       [address]       [place], [place]  [address]       Ph: [phone] or [phone]       Fax: [phone]   RxID:   [id number] LIPITOR 40 MG TABS (ATORVASTATIN CALCIUM) Take 1 tablet by mouth once a day  #90 x 6   Entered and Authorized by:   Gordy Savers  MD   Signed by:   Gordy Savers  MD on 07/22/2010   Method used:   Electronically to        [facility] [address] [address] (retail)       [address]       [place], [place]  [address]       Ph: [phone] or [phone]       Fax: [phone]   RxID:   [id number] CYMBALTA 30 MG  CPEP (DULOXETINE HCL) once daily  #90 x 2   Entered and Authorized by:   Gordy Savers  MD   Signed by:   Gordy Savers  MD on 07/22/2010   Method used:   Electronically to  [facility] (retail)       [address]       [place], [place]  [address]       Ph: [phone] or [phone]       Fax: [phone]   RxID:   [id number] CATAPRES 0.1 MG TABS  (CLONIDINE HCL) Take 1 tablet by mouth at bedtime  #90 x 6   Entered and Authorized by:   Gordy Savers  MD   Signed by:   Gordy Savers  MD on 07/22/2010   Method used:   Electronically to        [facility] [address] [address] (retail)       [address]       [place], [place]  [address]       Ph: [phone] or [phone]       Fax: [phone]   RxID:   [id number]    Orders Added: 1)  Est. Patient Level IV [[address]]

## 2010-10-06 NOTE — Procedures (Signed)
Summary: EGD   EGD   Imported By: Kassie Mends 07/06/2008 11:24:50  _____________________________________________________________________  External Attachment:    Type:   Image     Comment:   EGD

## 2010-10-06 NOTE — Progress Notes (Signed)
Summary: SURGICAL CLEARANCE   Phone Note From Other Clinic Call back at [phone]   Caller: [facility] ORDINATOR FOR DR Ophelia Charter  Call For: K  Summary of Call: FAX [phone] HAS NOT RECEIVED THE OK FOR SURGERY BACK FROM DR K AND PATIENT IS DUE FOR SURGERY ON FRIDAY THE 29TH  Initial call taken by: Roselle Locus,  October 02, 2008 4:23 PM    Patient  has not been evaluated in months and was a NO Show for last appointment; needs ROV   before can be cleared for surgery

## 2010-10-06 NOTE — Assessment & Plan Note (Signed)
Summary: fu on meds/njr    Vital Signs:  Patient Profile:   45 Years Old Male Weight:      260 pounds Temp:     98.7 degrees F oral BP sitting:   108 / 72  (right arm) Cuff size:   regular  Vitals Entered By: Raechel Ache, RN (November 08, 2008 1:00 PM)                 Chief Complaint:  Talk about meds..  History of Present Illness: 45 year old patient in today  for follow up hypertension dyslipidemia, cardiomyopathy.  He has a history also neurosarcoidosis, and the father [facility] by both rheumatology and neurosurgery.  Has a history of anxiety, depression followed at the [facility].  He has done quite well.  He does have a chronic pain syndrome, which seems quite stable.  He has gastroesophageal reflux disease.  No complaints of chest pain, palpitations, or senescent reflux symptoms.  Today, denies much in the way of chronic pain.  He remains on azathioprine . as well as prednisone.  He has a history of impaired glucose tolerance    Current Allergies: No known allergies   Past Medical History:    Reviewed history from 10/27/2007 and no changes required:       Depression       Hypertension       obstructive sleep apnea       B12 deficiency       chronic low back pain       pulmonary sarcoidosis       GERD       Osteoarthritis       hypertrophic cardiomyopathy       impaired glucose tolerance       Hyperlipidemia  Past Surgical History:    Reviewed history from 06/01/2007 and no changes required:       Cervicothoracic decompression and fusin-1998       Partial Nephrectomy 2007       status post heart catheterization 2000   Family History:    Reviewed history from 03/06/2007 and no changes required:       Family History Other cancer- Prostate       Fam hx Renal dz       Fam hx CHF  Social History:    Reviewed history from 03/06/2007 and no changes required:       Married       Alcohol use-no       Drug use-no    Review of Systems  The  patient denies anorexia, fever, weight loss, weight gain, vision loss, decreased hearing, hoarseness, chest pain, syncope, dyspnea on exertion, peripheral edema, prolonged cough, headaches, hemoptysis, abdominal pain, melena, hematochezia, severe indigestion/heartburn, hematuria, incontinence, genital sores, muscle weakness, suspicious skin lesions, transient blindness, difficulty walking, depression, unusual weight change, abnormal bleeding, enlarged lymph nodes, angioedema, breast masses, and testicular masses.     Physical Exam  General:     overweight-appearing.  120/78 Head:     Normocephalic and atraumatic without obvious abnormalities. No apparent alopecia or balding. Eyes:     No corneal or conjunctival inflammation noted. EOMI. Perrla. Funduscopic exam benign, without hemorrhages, exudates or papilledema. Vision grossly normal. Ears:     External ear exam shows no significant lesions or deformities.  Otoscopic examination reveals clear canals, tympanic membranes are intact bilaterally without bulging, retraction, inflammation or discharge. Hearing is grossly normal bilaterally. Mouth:     Oral mucosa and  oropharynx without lesions or exudates.  Teeth in good repair. Neck:     No deformities, masses, or tenderness noted. Chest Wall:     No deformities, masses, tenderness or gynecomastia noted. Lungs:     Normal respiratory effort, chest expands symmetrically. Lungs are clear to auscultation, no crackles or wheezes. Heart:     Normal rate and regular rhythm. S1 and S2 normal without gallop, murmur, click, rub or other extra sounds. Abdomen:     Bowel sounds positive,abdomen soft and non-tender without masses, organomegaly or hernias noted. Msk:     No deformity or scoliosis noted of thoracic or lumbar spine.   Pulses:     R and L carotid,radial,femoral,dorsalis pedis and posterior tibial pulses are full and equal bilaterally Extremities:     No clubbing, cyanosis, edema, or  deformity noted with normal full range of motion of all joints.   Neurologic:     No cranial nerve deficits noted. Station and gait are normal. Plantar reflexes are down-going bilaterally. DTRs are symmetrical throughout. Sensory, motor and coordinative functions appear intact.    Impression & Recommendations:  Problem # 1:  HYPERLIPIDEMIA (ICD-272.4)  His updated medication list for this problem includes:    Lipitor 40 Mg Tabs (Atorvastatin calcium) .Marland Kitchen... Take 1 tablet by mouth once a day will check a lipid profile Orders: Venipuncture ([address]) TLB-AST (SGOT) (84450-SGOT) TLB-Cholesterol, Total (82465-CHO)   Problem # 2:  GERD (ICD-530.81)  The following medications were removed from the medication list:    Prevacid 30 Mg Cpdr (Lansoprazole) .Marland Kitchen... Take 1 capsule by mouth twice a day  His updated medication list for this problem includes:    Nexium 40 Mg Cpdr (Esomeprazole magnesium) ..... One daily   Problem # 3:  B12 DEFICIENCY (ICD-266.2)  Problem # 4:  CARDIOMYOPATHY, IDIOPATHIC HYPERTROPHIC (ICD-425.4)  Problem # 5:  HYPERTENSION (ICD-401.9)  The following medications were removed from the medication list:    Atenolol 100 Mg Tabs (Atenolol) .Marland Kitchen... 1 two times a day  His updated medication list for this problem includes:    Catapres 0.1 Mg Tabs (Clonidine hcl) .Marland Kitchen... Take 1 tablet by mouth at bedtime    Atenolol 50 Mg Tabs (Atenolol) .Marland Kitchen... 1 two times a day   Problem # 6:  IMPAIRED GLUCOSE TOLERANCE (ICD-271.3) will check a hemoglobin A1c Orders: TLB-A1C / Hgb A1C (Glycohemoglobin) (83036-A1C)   Complete Medication List: 1)  Azasan 100 Mg Tabs (Azathioprine) .... Take 1 tablet by mouth two times a day 2)  Catapres 0.1 Mg Tabs (Clonidine hcl) .... Take 1 tablet by mouth at bedtime 3)  Cymbalta 30 Mg Cpep (Duloxetine hcl) .... Once daily 4)  Lipitor 40 Mg Tabs (Atorvastatin calcium) .... Take 1 tablet by mouth once a day 5)  Neurontin 300 Mg Caps (Gabapentin) ....  3 four times a day 6)  Prednisone 5 Mg Tabs (prednisone)  .Marland Kitchen.. 1 once a day 7)  Wellbutrin Xl 300 Mg Tb24 (Bupropion hcl) .Marland Kitchen.. 1 once daily 8)  Wellbutrin Sr 150 Mg Tb12 (Bupropion hcl) .Marland Kitchen.. 1 once daily 9)  Flomax 0.4 Mg Cp24 (Tamsulosin hcl) .Marland Kitchen.. 1 once daily 10)  Restasis 0.05 % Emul (Cyclosporine) .... Once daily 11)  Tramadol Hcl 50 Mg Tabs (Tramadol hcl) .... One every 6 hrs for pain 12)  Ascensia Contour Test Strp (Glucose blood) .... Use 5 times day 13)  Cyanocobalamin 1000 Mcg/ml Inj Soln (Cyanocobalamin) .... Uad 14)  Atenolol 50 Mg Tabs (Atenolol) .Marland Kitchen.. 1 two times a day 15)  Cymbalta 60 Mg Cpep (Duloxetine hcl) .... One daily 16)  Nexium 40 Mg Cpdr (Esomeprazole magnesium) .... One daily   Patient Instructions: 1)  Please schedule a follow-up appointment in 4 months. 2)  Limit your Sodium (Salt). 3)  It is important that you exercise regularly at least 20 minutes 5 times a week. If you develop chest pain, have severe difficulty breathing, or feel very tired , stop exercising immediately and seek medical attention. 4)  You need to lose weight. Consider a lower calorie diet and regular exercise.  5)  Check your blood sugars regularly. If your readings are usually above : or below 70 you should contact our office. 6)  Check your Blood Pressure regularly. If it is above:  140/90 you should make an appointment.   Prescriptions: NEXIUM 40 MG CPDR (ESOMEPRAZOLE MAGNESIUM) one daily  #90 x 6   Entered and Authorized by:   Gordy Savers  MD   Signed by:   Gordy Savers  MD on 11/08/2008   Method used:   Print then Give to Patient   RxID:   [id number] CYMBALTA 60 MG CPEP (DULOXETINE HCL) one daily  #90 x 6   Entered and Authorized by:   Gordy Savers  MD   Signed by:   Gordy Savers  MD on 11/08/2008   Method used:   Print then Give to Patient   RxID:   [id number] ATENOLOL 50 MG TABS (ATENOLOL) 1 two times a day  #180 x 6   Entered and  Authorized by:   Gordy Savers  MD   Signed by:   Gordy Savers  MD on 11/08/2008   Method used:   Print then Give to Patient   RxID:   [id number] CYANOCOBALAMIN 1000 MCG/ML INJ SOLN (CYANOCOBALAMIN) UAD  #30 x 10   Entered and Authorized by:   Gordy Savers  MD   Signed by:   Gordy Savers  MD on 11/08/2008   Method used:   Print then Give to Patient   RxID:   [id number] TRAMADOL HCL 50 MG  TABS (TRAMADOL HCL) one every 6 hrs for pain  #180 x 4   Entered and Authorized by:   Gordy Savers  MD   Signed by:   Gordy Savers  MD on 11/08/2008   Method used:   Print then Give to Patient   RxID:   [id number] FLOMAX 0.4 MG  CP24 (TAMSULOSIN HCL) 1 once daily  #90 x 6   Entered and Authorized by:   Gordy Savers  MD   Signed by:   Gordy Savers  MD on 11/08/2008   Method used:   Print then Give to Patient   RxID:   [id number] WELLBUTRIN SR 150 MG  TB12 (BUPROPION HCL) 1 once daily  #90 x 6   Entered and Authorized by:   Gordy Savers  MD   Signed by:   Gordy Savers  MD on 11/08/2008   Method used:   Print then Give to Patient   RxID:   [id number] WELLBUTRIN XL 300 MG  TB24 (BUPROPION HCL) 1 once daily  #90 x 6   Entered and Authorized by:   Gordy Savers  MD   Signed by:   Gordy Savers  MD on 11/08/2008   Method used:   Print then Give to Patient   RxID:   [id number] NEURONTIN 300 MG CAPS (GABAPENTIN) 3 four  times a day  #270 x 6   Entered and Authorized by:   Gordy Savers  MD   Signed by:   Gordy Savers  MD on 11/08/2008   Method used:   Print then Give to Patient   RxID:   [id number] LIPITOR 40 MG TABS (ATORVASTATIN CALCIUM) Take 1 tablet by mouth once a day  #90 x 6   Entered and Authorized by:   Gordy Savers  MD   Signed by:   Gordy Savers  MD on 11/08/2008   Method used:   Print then Give to Patient   RxID:   [id number] CYMBALTA 30  MG  CPEP (DULOXETINE HCL) once daily  #90 x 6   Entered and Authorized by:   Gordy Savers  MD   Signed by:   Gordy Savers  MD on 11/08/2008   Method used:   Print then Give to Patient   RxID:   [id number] CATAPRES 0.1 MG TABS (CLONIDINE HCL) Take 1 tablet by mouth at bedtime  #90 x 6   Entered and Authorized by:   Gordy Savers  MD   Signed by:   Gordy Savers  MD on 11/08/2008   Method used:   Print then Give to Patient   RxID:   [id number] AZASAN 100 MG  TABS (AZATHIOPRINE) Take 1 tablet by mouth two times a day  #90 x 6   Entered and Authorized by:   Gordy Savers  MD   Signed by:   Gordy Savers  MD on 11/08/2008   Method used:   Print then Give to Patient   RxID:   [id number]

## 2010-10-06 NOTE — Letter (Signed)
Summary: [facility]  [facility]   Imported By: Maryln Gottron 01/22/2009 09:09:18  _____________________________________________________________________  External Attachment:    Type:   Image     Comment:   External Document

## 2010-10-06 NOTE — Letter (Signed)
Summary: [facility]  [facility]   Imported By: Maryln Gottron 09/25/2009 09:22:18  _____________________________________________________________________  External Attachment:    Type:   Image     Comment:   External Document

## 2010-10-06 NOTE — Letter (Signed)
Summary: [facility]  [facility]   Imported By: Felipa Evener 01/05/2008 18:07:36  _____________________________________________________________________  External Attachment:    Type:   Image     Comment:   [facility]oids

## 2010-10-06 NOTE — Progress Notes (Signed)
Summary: REFILL  Phone Note From Pharmacy   Caller: Karin Golden [facility] [address] Call For: [name]  Summary of Call: ATENOLOL 100MG  TAB TAKE 1 TAB TWICE DAILY #200 LAST QTY DIS 68 LAST FILLED 05-05-07 CLONIDINE HCL 0.1 MG TAB TAKE 1 TAB TWICE DAILY #200 LAST QTY DIS 68 LAST FILLED 05-05-07 PREVACID 30 MG CAP TAKE 1 CAP TWICE DAILY #200 LAST QTY DIS 68 LAST FILLED 05-05-07 LIPITOR 40MG  TAB TAKE 1 TAB DAILY #100 LAST QTY DIS 34 LAST FILLED 05-05-07 Initial call taken by: Barnie Mort,  June 05, 2007 2:42 PM  Follow-up for Phone Call        Pharmacist called Follow-up by: Raechel Ache, RN,  June 05, 2007 3:35 PM      Prescriptions: PREVACID 30 MG CPDR (LANSOPRAZOLE) Take 1 capsule by mouth twice a day  #200 x 6   Entered by:   Raechel Ache, RN   Authorized by:   Gordy Savers  MD   Signed by:   Raechel Ache, RN on 06/05/2007   Method used:   Historical   RxID:   [id number] LIPITOR 40 MG TABS (ATORVASTATIN CALCIUM) Take 1 tablet by mouth once a day  #100 x 6   Entered by:   Raechel Ache, RN   Authorized by:   Gordy Savers  MD   Signed by:   Raechel Ache, RN on 06/05/2007   Method used:   Historical   RxID:   [id number] CATAPRES 0.1 MG TABS (CLONIDINE HCL) Take 1 tablet by mouth twice a day  #200 x 6   Entered by:   Raechel Ache, RN   Authorized by:   Gordy Savers  MD   Signed by:   Raechel Ache, RN on 06/05/2007   Method used:   Historical   RxID:   [id number] ATENOLOL 100 MG TABS (ATENOLOL) 1 two times a day  #200 x 6   Entered by:   Raechel Ache, RN   Authorized by:   Gordy Savers  MD   Signed by:   Raechel Ache, RN on 06/05/2007   Method used:   Historical   RxID:   [id number]

## 2010-10-06 NOTE — Assessment & Plan Note (Signed)
Summary: chest pain, irregular heartrate/nn    Vital Signs:  Patient Profile:   45 Years Old Male Weight:      270 pounds O2 Sat:      96 % O2 treatment:    Room Air Temp:     98.2 degrees F oral Pulse rate:   78 / minute Pulse rhythm:   regular BP sitting:   128 / 78  (left arm) Cuff size:   large  Vitals Entered By: Raechel Ache, RN (October 23, 2007 4:53 PM)                 Chief Complaint:  Still having chest discomfort and SOB and dizzy last night. Had stress test last Friday and then saw Dr. Cato Mulligan. Increased prednisone. Had CT chest 2 wks ago.Marland Kitchen  History of Present Illness: 45 year old patient who has a history of cardiomyopathy and sarcoidosis, and was hospitalized earlier this month.  a Cardiolite  stress test was done 3 days ago, results pending.  He presents with a complaint of some left-sided chest pain, as well as some palpitations.  He was seen and followed 3 days ago, and his prednisone dose increased from 20 to 40 mg daily.  At the present time.  He is somewhat improved    Current Allergies: No known allergies       Physical Exam  General:     overweight-appearing.  anxious no acute distress.  Blood pressure 140/86 pulse 82 Head:     Normocephalic and atraumatic without obvious abnormalities. No apparent alopecia or balding. Mouth:     Oral mucosa and oropharynx without lesions or exudates.  Teeth in good repair. Neck:     No deformities, masses, or tenderness noted. Chest Wall:     no chest wall tenderness Lungs:     Normal respiratory effort, chest expands symmetrically. Lungs are clear to auscultation, no crackles or wheezes. 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 Take  1 capsule by mouth once a day    Atenolol 100 Mg Tabs (Atenolol) .Marland Kitchen... 1 two times a day    Catapres 0.1 Mg Tabs (Clonidine hcl) .Marland Kitchen... Take 1 tablet by mouth twice a day   Problem # 3:  PALPITATIONS (ICD-785.1)  His updated medication list for this problem includes:    Atenolol 100 Mg Tabs (Atenolol) .Marland Kitchen... 1 two times a day   Complete Medication List: 1)  Altace 5 Mg Caps (Ramipril) .... Take 1 capsule by mouth once a day 2)  Atenolol 100 Mg Tabs (Atenolol) .Marland Kitchen.. 1 two times a day 3)  Azathioprine 100 Mg Tabs (azathioprine)  .... Take 2 tablet by mouth once a day 4)  Catapres 0.1 Mg Tabs (Clonidine hcl) .... Take 1 tablet by mouth twice a day 5)  Cymbalta 30 Mg Cpep (Duloxetine hcl) .... 3 once daily 6)  Lipitor 40 Mg Tabs (Atorvastatin calcium) .... Take 1 tablet by mouth once a day 7)  Neurontin 300 Mg Caps (Gabapentin) .... 3 four times a day 8)  Prednisone 20 Mg Tabs (Prednisone) .Marland Kitchen.. 1 once a day 9)  Prevacid 30 Mg Cpdr (Lansoprazole) .... Take 1 capsule by mouth twice a day 10)  Wellbutrin Xl  300 Mg Tb24 (Bupropion hcl) .Marland Kitchen.. 1 once daily 11)  Wellbutrin Sr 150 Mg Tb12 (Bupropion hcl) .Marland Kitchen.. 1 once daily 12)  Flomax 0.4 Mg Cp24 (Tamsulosin hcl) .Marland Kitchen.. 1 once daily 13)  Ativan 1 Mg Tabs (Lorazepam) .... 2 q6h as needed 14)  Promethazine Hcl 25 Mg Tabs (Promethazine hcl) .Marland Kitchen.. 1 q6h as needed 15)  Restasis 0.05 % Emul (Cyclosporine) .... Once daily   Patient Instructions: 1)   return as scheduled on Friday of this week for follow-up.  Will review the results of your stress test; use atenolol every 6 hours p.r.n. pain 2)  decrease  prednisone back down to 20 mg daily    ]

## 2010-10-06 NOTE — Assessment & Plan Note (Signed)
Summary: urinary calculus/acute pain/dm   Vital Signs:  Patient profile:   45 year old male Weight:      242 pounds Temp:     98.0 degrees F oral BP sitting:   104 / 80  (left arm) Cuff size:   large  Vitals Entered By: Raechel Ache, RN (June 09, 2009 4:16 PM) CC: C/o stomach pain- can't eat. Was told he's anemic and has a kidney stone at urgent care. Flu Vaccine Consent Questions     Do you have a history of severe allergic reactions to this vaccine? no    Any prior history of allergic reactions to egg and/or gelatin? no    Do you have a sensitivity to the preservative Thimersol? no    Do you have a past history of Guillan-Barre Syndrome? no    Do you currently have an acute febrile illness? no    Have you ever had a severe reaction to latex? no    Vaccine information given and explained to patient? yes    Are you currently pregnant? no    Lot Number:[id number]   Exp Date:03/05/2010   Site Given  right Deltoid IM   CC:  C/o stomach pain- can't eat. Was told he's anemic and has a kidney stone at urgent care.[name]  History of Present Illness: 45 year old, African-American male has multiple medical problems.  These include impaired glucose tolerance, gastroesophageal reflux disease with history of esophagitis treated hypertension and hypertrophic cardiomyopathy.  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  He was evaluated recently due to right-sided abdominal pain.  This revealed a 5-mm nonobstructing stone on the left.  No other intra-abdominal pathology or is somewhat improved today but still having some mild epigastric and right upper quadrant pain.  No nausea and vomiting.  Laboratory studies were fairly unremarkable.  There is no hematuria.  [facility] records from [facility] reviewed  Allergies: No Known Drug Allergies  Past History:  Past Medical History: Reviewed history from 11/08/2008 and no changes  required. Depression Hypertension obstructive sleep apnea B12 deficiency chronic low back pain pulmonary sarcoidosis GERD Osteoarthritis hypertrophic cardiomyopathy impaired glucose tolerance Hyperlipidemia  Review of Systems       The patient complains of weight loss, chest pain, and abdominal pain.  The patient denies anorexia, fever, weight gain, vision loss, decreased hearing, hoarseness, syncope, dyspnea on exertion, peripheral edema, prolonged cough, headaches, hemoptysis, melena, hematochezia, severe indigestion/heartburn, hematuria, incontinence, genital sores, muscle weakness, suspicious skin lesions, transient blindness, difficulty walking, depression, unusual weight change, abnormal bleeding, enlarged lymph nodes, angioedema, breast masses, and testicular masses.    Physical Exam  General:  overweight-appearing.  low-normal blood pressure, no distress Head:  Normocephalic and atraumatic without obvious abnormalities. No apparent alopecia or balding. Eyes:  No corneal or conjunctival inflammation noted. EOMI. Perrla. Funduscopic exam benign, without hemorrhages, exudates or papilledema. Vision grossly normal. Mouth:  Oral mucosa and oropharynx without lesions or exudates.  Teeth in good repair. Neck:  No deformities, masses, or tenderness noted. Lungs:  Normal respiratory effort, chest expands symmetrically. Lungs are clear to auscultation, no crackles or wheezes. Heart:  Normal rate and regular rhythm. S1 and S2 normal without gallop, murmur, click, rub or other extra sounds. Abdomen:  obese soft, mild epigastric and right upper quadrant tenderness.  No guarding or rebound.  Bowel sounds active. Msk:  No deformity or scoliosis noted of thoracic or lumbar spine.   Pulses:  R and L carotid,radial,femoral,dorsalis pedis and  posterior tibial pulses are full and equal bilaterally Extremities:  No clubbing, cyanosis, edema, or deformity noted with normal full range of motion of all  joints.     Impression & Recommendations:  Problem # 1:  IMPAIRED GLUCOSE TOLERANCE (ICD-271.3)  Problem # 2:  GERD (ICD-530.81)  His updated medication list for this problem includes:    Nexium 40 Mg Cpdr (Esomeprazole magnesium) ..... One daily  His updated medication list for this problem includes:    Nexium 40 Mg Cpdr (Esomeprazole magnesium) ..... One daily  Problem # 3:  ESOPHAGITIS (ICD-530.10)  His updated medication list for this problem includes:    Nexium 40 Mg Cpdr (Esomeprazole magnesium) ..... One daily  His updated medication list for this problem includes:    Nexium 40 Mg Cpdr (Esomeprazole magnesium) ..... One daily  Problem # 4:  HYPERTENSION (ICD-401.9)  His updated medication list for this problem includes:    Catapres 0.1 Mg Tabs (Clonidine hcl) .[name]... Take 1 tablet by mouth at bedtime    Atenolol 50 Mg Tabs (Atenolol) .[name]... 1 two times a day  His updated medication list for this problem includes:    Catapres 0.1 Mg Tabs (Clonidine hcl) .[name]... Take 1 tablet by mouth at bedtime    Atenolol 50 Mg Tabs (Atenolol) .[name]... 1 two times a day  Complete Medication List: 1)  Azasan 100 Mg Tabs (Azathioprine) .... Take 1 tablet by mouth two times a day 2)  Catapres 0.1 Mg Tabs (Clonidine hcl) .... Take 1 tablet by mouth at bedtime 3)  Cymbalta 30 Mg Cpep (Duloxetine hcl) .... Once daily 4)  Lipitor 40 Mg Tabs (Atorvastatin calcium) .... Take 1 tablet by mouth once a day 5)  Neurontin 300 Mg Caps (Gabapentin) .... 3 four times a day 6)  Prednisone 5 Mg Tabs (prednisone)  .[name].. 1 once a day 7)  Wellbutrin Xl 300 Mg Tb24 (Bupropion hcl) .[name].. 1 once daily 8)  Wellbutrin Sr 150 Mg Tb12 (Bupropion hcl) .[name].. 1 once daily 9)  Flomax 0.4 Mg Cp24 (Tamsulosin hcl) .[name].. 1 once daily 10)  Restasis 0.05 % Emul (Cyclosporine) .... Once daily 11)  Tramadol Hcl 50 Mg Tabs (Tramadol hcl) .... One every 6 hrs for pain 12)  Ascensia Contour Test Strp (Glucose blood) .... Use 5 times  day 13)  Cyanocobalamin 1000 Mcg/ml Inj Soln (Cyanocobalamin) .... Uad 14)  Atenolol 50 Mg Tabs (Atenolol) .[name].. 1 two times a day 15)  Cymbalta 60 Mg Cpep (Duloxetine hcl) .... One daily 16)  Nexium 40 Mg Cpdr (Esomeprazole magnesium) .... One daily 17)  Valium 5 Mg Tabs (Diazepam) .[name].. 1 two times a day as needed  Other Orders: Flu Vaccine 87yrs + (16109) Administration Flu vaccine - MCR (U0454)  Patient Instructions: 1)  increased since twice daily for two weeks 2)  Limit your Sodium (Salt). 3)  Please schedule a follow-up appointment in 3 months. 4)  call if symptoms intensify

## 2010-10-06 NOTE — Miscellaneous (Signed)
  Medications Added ASCENSIA CONTOUR TEST   STRP (GLUCOSE BLOOD) use 5 times day       Clinical Lists Changes  Medications: Added new medication of ASCENSIA CONTOUR TEST   STRP (GLUCOSE BLOOD) use 5 times day - Signed Rx of ASCENSIA CONTOUR TEST   STRP (GLUCOSE BLOOD) use 5 times day;  #150 x 11;  Signed;  Entered by: Raechel Ache, RN;  Authorized by: Gordy Savers  MD;  Method used: Print then Give to Patient    Prescriptions: ASCENSIA CONTOUR TEST   STRP (GLUCOSE BLOOD) use 5 times day  #150 x 11   Entered by:   Raechel Ache, RN   Authorized by:   Gordy Savers  MD   Signed by:   Raechel Ache, RN on 11/14/2007   Method used:   Print then Give to Patient   RxID:   [id number]

## 2010-10-06 NOTE — Progress Notes (Signed)
Summary: Scheduled OV, not getting better  Phone Note Call from Patient Call back at Home Phone [phone]   Caller: Patient Call For: Dr Charlestine Massed Summary of Call: pt congested and stuffy, coughing up green sputum, lungs feel sore, taking cough medicine but not working. pls call pt pharmacy [facility] on [address]  Initial call taken by: Shan Levans,  May 31, 2007 2:31 PM  Follow-up for Phone Call        Scheduled OV for tomorrow with Dr Amador Cunas to evaluate sx. Follow-up by: Sid Falcon LPN,  May 31, 2007 3:00 PM

## 2010-10-06 NOTE — Letter (Signed)
Summary: [facility]  [place] [facility] [place]   Imported By: Maryln Gottron 12/12/2008 13:40:11  _____________________________________________________________________  External Attachment:    Type:   Image     Comment:   External Document

## 2010-10-06 NOTE — Assessment & Plan Note (Signed)
Summary: st/njr 1.30p   Vital Signs:  Patient Profile:   45 Years Old Ferguson Weight:      260 pounds O2 Sat:      94 % O2 treatment:    Room Air Temp:     98.2 degrees F oral Pulse rate:   59 / minute BP sitting:   110 / 80  (left arm) Cuff size:   regular  Vitals Entered By: Romualdo Bolk, CMA (March 13, 2008 1:31 PM)                 Chief Complaint:  Sore Throat x 3 days, chest pains, sob and exposed to H1N1, and URI symptoms.  History of Present Illness:  URI Symptoms      This is a 45 year old man who presents with URI symptoms.  The symptoms began 3 days ago.  The severity is described as mild.  The patient reports sore throat, but denies nasal congestion, clear nasal discharge, purulent nasal discharge, dry cough, productive cough, earache, and sick contacts.  The patient denies fever, low-grade fever (<100.5 degrees), fever of 100.5-103 degrees, fever of 103.1-104 degrees, fever to >104 degrees, stiff neck, dyspnea, wheezing, rash, vomiting, diarrhea, use of an antipyretic, and response to antipyretic.  The patient denies itchy watery eyes, itchy throat, sneezing, seasonal symptoms, response to antihistamine, headache, muscle aches, and severe fatigue.  The patient denies the following risk factors for Strep sinusitis: unilateral facial pain, unilateral nasal discharge, poor response to decongestant, double sickening, tooth pain, Strep exposure, tender adenopathy, and absence of cough.    pt says initially he had nausea, chills and myalgias (6 days ago), those symptoms resolved and then developed sore throal.    Prior Medications Reviewed Using: Patient Recall  Prior Medication List:  ALTACE 5 MG CAPS (RAMIPRIL) Take 1 capsule by mouth once a day ATENOLOL 100 MG TABS (ATENOLOL) 1 two times a day * AZATHIOPRINE 100  MG TABS (AZATHIOPRINE) Take 2 tablet by mouth once a day CATAPRES 0.1 MG TABS (CLONIDINE HCL) Take 1 tablet by mouth twice a day CYMBALTA 30 MG  CPEP  (DULOXETINE HCL) 3 once daily LIPITOR 40 MG TABS (ATORVASTATIN CALCIUM) Take 1 tablet by mouth once a day NEURONTIN 300 MG CAPS (GABAPENTIN) 3 four times a day PREDNISONE 20 MG TABS (PREDNISONE) 1 once a day PREVACID 30 MG CPDR (LANSOPRAZOLE) Take 1 capsule by mouth twice a day WELLBUTRIN XL 300 MG  TB24 (BUPROPION HCL) 1 once daily WELLBUTRIN SR 150 MG  TB12 (BUPROPION HCL) 1 once daily FLOMAX 0.4 MG  CP24 (TAMSULOSIN HCL) 1 once daily ATIVAN 1 MG  TABS (LORAZEPAM) 2 q6h as needed PROMETHAZINE HCL 25 MG  TABS (PROMETHAZINE HCL) 1 q6h as needed RESTASIS 0.05 %  EMUL (CYCLOSPORINE) once daily TRAMADOL HCL 50 MG  TABS (TRAMADOL HCL) one every 6 hrs for pain ASCENSIA CONTOUR TEST   STRP (GLUCOSE BLOOD) use 5 times day BENTYL 10 MG  CAPS (DICYCLOMINE HCL) 1 qid CYANOCOBALAMIN 1000 MCG/ML INJ SOLN (CYANOCOBALAMIN) UAD   Current Allergies (reviewed today): No known allergies   Past Medical History:    Reviewed history from 10/27/2007 and no changes required:       Depression       Hypertension       obstructive sleep apnea       B12 deficiency       chronic low back pain       pulmonary sarcoidosis  GERD       Osteoarthritis       hypertrophic cardiomyopathy  Past Surgical History:    Reviewed history from 06/01/2007 and no changes required:       Cervicothoracic decompression and fusin-1998       Partial Nephrectomy 2007       status post heart catheterization 2000   Family History:    Reviewed history from 03/06/2007 and no changes required:       Family History Other cancer- Prostate       Fam hx Renal dz       Fam hx CHF  Social History:    Reviewed history from 03/06/2007 and no changes required:       Married       Alcohol use-no       Drug use-no    Review of Systems       no other complaints in a complete ROS    Physical Exam  General:     Well-developed,well-nourished,in no acute distress; alert,appropriate and cooperative throughout  examination Head:     normocephalic and atraumatic.   Eyes:     pupils equal and pupils round.   Ears:     R ear normal and L ear normal.   Mouth:     Oral mucosa and oropharynx without lesions or exudates.  Teeth in good repair. tonsils absent Neck:     No deformities, masses, or tenderness noted. Chest Wall:     No deformities, masses, tenderness or gynecomastia noted. Lungs:     Normal respiratory effort, chest expands symmetrically. Lungs are clear to auscultation, no crackles or wheezes. Abdomen:     Bowel sounds positive,abdomen soft and non-tender without masses, organomegaly or hernias noted.  overweight Neurologic:     cranial nerves II-XII intact and gait normal.   Skin:     turgor normal and color normal.   Psych:     normally interactive and good eye contact.      Impression & Recommendations:  Problem # 1:  URI (ICD-465.9) unclear etiology discussed with patient and wife (she provides part of the history) he has a complicated medical hx and is at risk fo rinfections (immunosuppressed) check strep and CBC no sxs consistent with H1N1 His updated medication list for this problem includes:    Promethazine Hcl 25 Mg Tabs (Promethazine hcl) .Marland Kitchen... 1 q6h as needed  Orders: Venipuncture (01093) TLB-CBC Platelet - w/Differential (85025-CBCD) Rapid Strep (23557)   Complete Medication List: 1)  Altace 5 Mg Caps (Ramipril) .... Take 1 capsule by mouth once a day 2)  Atenolol 100 Mg Tabs (Atenolol) .Marland Kitchen.. 1 two times a day 3)  Azasan 100 Mg Tabs (Azathioprine) .... Take 1 tablet by mouth two times a day 4)  Catapres 0.1 Mg Tabs (Clonidine hcl) .... Take 1 tablet by mouth twice a day 5)  Cymbalta 30 Mg Cpep (Duloxetine hcl) .... 3 once daily 6)  Lipitor 40 Mg Tabs (Atorvastatin calcium) .... Take 1 tablet by mouth once a day 7)  Neurontin 300 Mg Caps (Gabapentin) .... 3 four times a day 8)  Prednisone 20 Mg Tabs (Prednisone) .Marland Kitchen.. 1 once a day 9)  Prevacid 30 Mg Cpdr  (Lansoprazole) .... Take 1 capsule by mouth twice a day 10)  Wellbutrin Xl 300 Mg Tb24 (Bupropion hcl) .Marland Kitchen.. 1 once daily 11)  Wellbutrin Sr 150 Mg Tb12 (Bupropion hcl) .Marland Kitchen.. 1 once daily 12)  Flomax 0.4 Mg Cp24 (Tamsulosin hcl) .Marland Kitchen.. 1 once  daily 13)  Ativan 1 Mg Tabs (Lorazepam) .... 2 q6h as needed 14)  Promethazine Hcl 25 Mg Tabs (Promethazine hcl) .Marland Kitchen.. 1 q6h as needed 15)  Restasis 0.05 % Emul (Cyclosporine) .... Once daily 16)  Tramadol Hcl 50 Mg Tabs (Tramadol hcl) .... One every 6 hrs for pain 17)  Ascensia Contour Test Strp (Glucose blood) .... Use 5 times day 18)  Bentyl 10 Mg Caps (Dicyclomine hcl) .Marland Kitchen.. 1 qid 19)  Cyanocobalamin 1000 Mcg/ml Inj Soln (Cyanocobalamin) .... Uad   Patient Instructions: 1)  Get plenty of rest, drink lots of clear liquids, and use  minimal Tylenolfor fever and comfort. Return in 7-10 days if you're not better:sooner if you're feeling worse. call for any fever over 100.5 or any other concerns   ]  Appended Document: Orders Update    Clinical Lists Changes  Observations: Added new observation of LAB RESULTS: Darra Lis RMA  March 13, 2008 1:52 PM  (03/13/2008 13:52) Added new observation of RAPID STREP: negative (03/13/2008 13:52)       Laboratory Results   Date/Time Reported: March 13, 2008 1:52 PM   Other Tests  Rapid Strep: negative Comments: Darra Lis RMA  March 13, 2008 1:52 PM

## 2010-10-06 NOTE — Letter (Signed)
Summary: [facility]  [facility]   Imported By: Maryln Gottron 01/22/2009 09:06:29  _____________________________________________________________________  External Attachment:    Type:   Image     Comment:   External Document

## 2010-10-06 NOTE — Consult Note (Signed)
Summary: office note  office note   Imported By: Kassie Mends 12/22/2007 13:04:28  _____________________________________________________________________  External Attachment:    Type:   Image     Comment:   office note

## 2010-10-06 NOTE — Progress Notes (Signed)
  Phone Note Call from Patient   Summary of Call: Pt called having neck pains advised pt to go to ED. Initial call taken by: Cheri Guppy,  November 25, 2007 12:49 PM

## 2010-10-06 NOTE — Progress Notes (Signed)
Summary: chest pain and irregular heartbeat   Phone Note Call from Patient Call back at [phone]   Caller: patient triage message Call For: K  Summary of Call: Came in Friday to see Dr Clent Ridges because Dr Kirtland Bouchard was not in.  Increaded his prednisone.  He is still having pain in his left side under his ribs and is having chest pains and heartbeat is real  irregular.   Initial call taken by: Roselle Locus,  October 23, 2007 12:12 PM  Follow-up for Phone Call        ROV today  Additional Follow-up for Phone Call Additional follow up Details #1::        Per Dr Amador Cunas, schedule OV today.  Called pt, he can be here at 4pm. Additional Follow-up by: Sid Falcon LPN,  October 23, 2007 3:19 PM

## 2010-10-06 NOTE — Letter (Signed)
Summary: [facility]  [facility]   Imported By: Maryln Gottron 01/22/2009 09:07:48  _____________________________________________________________________  External Attachment:    Type:   Image     Comment:   External Document

## 2010-10-06 NOTE — Assessment & Plan Note (Signed)
Summary: 4 MNTH ROV WITH LABS//SLM   Vital Signs:  Patient profile:   45 year old male Weight:      237 pounds Temp:     98.6 degrees F oral BP sitting:   108 / 84  (left arm) Cuff size:   large  Vitals Entered By: Raechel Ache, RN (July 10, 2009 8:43 AM) CC: 4 mo ROV. Is Patient Diabetic? No   CC:  4 mo ROV.Marland Kitchen  History of Present Illness: 45-year-old patient who is seen today for follow-up.  He is followed closely for neurosarcoid and medical regimen includes immunosuppressants.  His recent and seen by neurosurgery due to back and hip pain.  Evaluation is in process remains on low-dose prednisone.  He has hypertension hemopathy arthritis, and dyslipidemia.  His blood pressure has no well-controlled.  He continues to lose weight and is very pleased with his progress.  He denies any cardiopulmonary complaints.  Remains on Lipitor, which he continues to tolerate well  Allergies: No Known Drug Allergies  Past History:  Past Medical History: Reviewed history from 11/08/2008 and no changes required. Depression Hypertension obstructive sleep apnea B12 deficiency chronic low back pain pulmonary sarcoidosis GERD Osteoarthritis hypertrophic cardiomyopathy impaired glucose tolerance Hyperlipidemia  Family History: Reviewed history from 03/06/2007 and no changes required. Family History Other cancer- Prostate Fam hx Renal dz Fam hx CHF  Review of Systems       The patient complains of weight loss.  The patient denies anorexia, fever, weight gain, vision loss, decreased hearing, hoarseness, chest pain, syncope, dyspnea on exertion, peripheral edema, prolonged cough, headaches, hemoptysis, abdominal pain, melena, hematochezia, severe indigestion/heartburn, hematuria, incontinence, genital sores, muscle weakness, suspicious skin lesions, transient blindness, difficulty walking, depression, unusual weight change, abnormal bleeding, enlarged lymph nodes, angioedema, breast masses,  and testicular masses.    Physical Exam  General:  overweight-appearing.  124/78overweight-appearing.   Head:  Normocephalic and atraumatic without obvious abnormalities. No apparent alopecia or balding. Eyes:  No corneal or conjunctival inflammation noted. EOMI. Perrla. Funduscopic exam benign, without hemorrhages, exudates or papilledema. Vision grossly normal. Mouth:  Oral mucosa and oropharynx without lesions or exudates.  Teeth in good repair. Neck:  No deformities, masses, or tenderness noted. Lungs:  Normal respiratory effort, chest expands symmetrically. Lungs are clear to auscultation, no crackles or wheezes. Heart:  Normal rate and regular rhythm. S1 and S2 normal without gallop, murmur, click, rub or other extra sounds. Abdomen:  Bowel sounds positive,abdomen soft and non-tender without masses, organomegaly or hernias noted. Msk:  No deformity or scoliosis noted of thoracic or lumbar spine.   Pulses:  R and L carotid,radial,femoral,dorsalis pedis and posterior tibial pulses are full and equal bilaterally Extremities:  No clubbing, cyanosis, edema, or deformity noted with normal full range of motion of all joints.   Skin:  Intact without suspicious lesions or rashes Cervical Nodes:  No lymphadenopathy noted   Impression & Recommendations:  Problem # 1:  IMPAIRED GLUCOSE TOLERANCE (ICD-271.3)  Orders: TLB-BMP (Basic Metabolic Panel-BMET) (80048-METABOL) TLB-CBC Platelet - w/Differential (85025-CBCD) TLB-Hepatic/Liver Function Pnl (80076-HEPATIC)  Problem # 2:  HYPERLIPIDEMIA (ICD-272.4)  His updated medication list for this problem includes:    Lipitor 40 Mg Tabs (Atorvastatin calcium) .Marland Kitchen... Take 1 tablet by mouth once a day    His updated medication list for this problem includes:    Lipitor 40 Mg Tabs (Atorvastatin calcium) .Marland Kitchen... Take 1 tablet by mouth once a day  Orders: Venipuncture (91478) TLB-Lipid Panel (80061-LIPID) TLB-BMP (Basic Metabolic  Panel-BMET)  (80048-METABOL) TLB-CBC Platelet - w/Differential (85025-CBCD) TLB-Hepatic/Liver Function Pnl (80076-HEPATIC) TLB-TSH (Thyroid Stimulating Hormone) (84443-TSH)  Problem # 3:  HYPERTENSION (ICD-401.9)  His updated medication list for this problem includes:    Catapres 0.1 Mg Tabs (Clonidine hcl) .Marland Kitchen... Take 1 tablet by mouth at bedtime    Atenolol 50 Mg Tabs (Atenolol) .Marland Kitchen... 1 two times a day    His updated medication list for this problem includes:    Catapres 0.1 Mg Tabs (Clonidine hcl) .Marland Kitchen... Take 1 tablet by mouth at bedtime    Atenolol 50 Mg Tabs (Atenolol) .Marland Kitchen... 1 two times a day  Orders: TLB-BMP (Basic Metabolic Panel-BMET) (80048-METABOL) TLB-CBC Platelet - w/Differential (85025-CBCD) TLB-Hepatic/Liver Function Pnl (80076-HEPATIC)  Problem # 4:  SYNDROME, CHRONIC PAIN (ICD-338.4)  Complete Medication List: 1)  Azasan 100 Mg Tabs (Azathioprine) .... Take 1 tablet by mouth two times a day 2)  Catapres 0.1 Mg Tabs (Clonidine hcl) .... Take 1 tablet by mouth at bedtime 3)  Cymbalta 30 Mg Cpep (Duloxetine hcl) .... Once daily 4)  Lipitor 40 Mg Tabs (Atorvastatin calcium) .... Take 1 tablet by mouth once a day 5)  Neurontin 300 Mg Caps (Gabapentin) .... 3 four times a day 6)  Prednisone 5 Mg Tabs (prednisone)  .Marland Kitchen.. 1 once a day 7)  Wellbutrin Xl 300 Mg Tb24 (Bupropion hcl) .Marland Kitchen.. 1 once daily 8)  Wellbutrin Sr 150 Mg Tb12 (Bupropion hcl) .Marland Kitchen.. 1 once daily 9)  Flomax 0.4 Mg Cp24 (Tamsulosin hcl) .Marland Kitchen.. 1 once daily 10)  Restasis 0.05 % Emul (Cyclosporine) .... Once daily 11)  Tramadol Hcl 50 Mg Tabs (Tramadol hcl) .... One every 6 hrs for pain 12)  Ascensia Contour Test Strp (Glucose blood) .... Use 5 times day 13)  Cyanocobalamin 1000 Mcg/ml Inj Soln (Cyanocobalamin) .... Uad 14)  Atenolol 50 Mg Tabs (Atenolol) .Marland Kitchen.. 1 two times a day 15)  Cymbalta 60 Mg Cpep (Duloxetine hcl) .... One daily 16)  Nexium 40 Mg Cpdr (Esomeprazole magnesium) .... One daily 17)  Valium 5 Mg Tabs  (Diazepam) .Marland Kitchen.. 1 two times a day as needed  Patient Instructions: 1)  Please schedule a follow-up appointment in 3 months. 2)  Limit your Sodium (Salt). 3)  It is important that you exercise regularly at least 20 minutes 5 times a week. If you develop chest pain, have severe difficulty breathing, or feel very tired , stop exercising immediately and seek medical attention. 4)  You need to lose weight. Consider a lower calorie diet and regular exercise.

## 2010-10-06 NOTE — Consult Note (Signed)
Summary: [facility] note  [facility] note   Imported By: Kassie Mends 12/22/2007 13:12:00  _____________________________________________________________________  External Attachment:    Type:   Image     Comment:   [facility] note

## 2010-10-06 NOTE — Progress Notes (Signed)
   Phone Note Outgoing Call   Call placed by: Raechel Ache, RN,  September 14, 2007 9:23 AM Summary of Call: Called [id number] appt- phone not in service.

## 2010-10-06 NOTE — Letter (Signed)
Summary: [facility]   Imported By: Maryln Gottron 06/06/2009 15:06:47  _____________________________________________________________________  External Attachment:    Type:   Image     Comment:   External Document

## 2010-10-06 NOTE — Assessment & Plan Note (Signed)
Summary: follow up/mhf   Vital Signs:  Patient Profile:   45 Years Old Male Weight:      262 pounds O2 Sat:      95 % O2 treatment:    Room Air Temp:     98.9 degrees F oral BP sitting:   102 / 70  (left arm) Cuff size:   regular  Vitals Entered By: Raechel Ache, RN (June 05, 2008 11:01 AM)                Flu Vaccine Consent Questions     Do you have a history of severe allergic reactions to this vaccine? no    Any prior history of allergic reactions to egg and/or gelatin? no    Do you have a sensitivity to the preservative Thimersol? no    Do you have a past history of Guillan-Barre Syndrome? no    Do you currently have an acute febrile illness? no    Have you ever had a severe reaction to latex? no    Vaccine information given and explained to patient? yes    Are you currently pregnant? no    Lot Number:[id number]   Site Given  Left Deltoid IM   Chief Complaint:  Hosp f/u; still feels dizzy.[name]  History of Present Illness: 45 year old gentleman, history sarcoidosis, who is recent discharge for evaluation of atypical chest pain.  He has hypertension, dyslipidemia, and a history of a cardiomyopathy.  Since his discharge he is done fairly well except for occasional episodes of dizziness.  He has had some documented mild hypotension in the hospital as well is since his discharge; his atenolol dose has been tapered.  He has a chronic pain syndrome, which has been relatively stable.  His hospital records reviewed    Current Allergies: No known allergies   Past Medical History:    Reviewed history from 10/27/2007 and no changes required:       Depression       Hypertension       obstructive sleep apnea       B12 deficiency       chronic low back pain       pulmonary sarcoidosis       GERD       Osteoarthritis       hypertrophic cardiomyopathy     Review of Systems       The patient complains of chest pain.  The patient denies anorexia, fever, weight loss,  weight gain, vision loss, decreased hearing, hoarseness, syncope, dyspnea on exertion, peripheral edema, prolonged cough, headaches, hemoptysis, abdominal pain, melena, hematochezia, severe indigestion/heartburn, hematuria, incontinence, genital sores, muscle weakness, suspicious skin lesions, transient blindness, difficulty walking, depression, unusual weight change, abnormal bleeding, enlarged lymph nodes, angioedema, breast masses, and testicular masses.     Physical Exam  General:     overweight-appearing.  for pressure 120/78 Head:     Normocephalic and atraumatic without obvious abnormalities. No apparent alopecia or balding. Eyes:     No corneal or conjunctival inflammation noted. EOMI. Perrla. Funduscopic exam benign, without hemorrhages, exudates or papilledema. Vision grossly normal. Ears:     External ear exam shows no significant lesions or deformities.  Otoscopic examination reveals clear canals, tympanic membranes are intact bilaterally without bulging, retraction, inflammation or discharge. Hearing is grossly normal bilaterally. Mouth:     Oral mucosa and oropharynx without lesions or exudates.  Teeth in good repair.pharyngeal crowding and low-lying uvula.   Neck:  No deformities, masses, or tenderness noted. Chest Wall:     No deformities, masses, tenderness or gynecomastia noted. Lungs:     Normal respiratory effort, chest expands symmetrically. Lungs are clear to auscultation, no crackles or wheezes. Heart:     Normal rate and regular rhythm. S1 and S2 normal without gallop, murmur, click, rub or other extra sounds. Abdomen:     Bowel sounds positive,abdomen soft and non-tender without masses, organomegaly or hernias noted.    Impression & Recommendations:  Problem # 1:  CHEST PAIN (ICD-786.50)  Problem # 2:  HYPERTENSION (ICD-401.9)  The following medications were removed from the medication list:    Altace 5 Mg Caps (Ramipril) .[name]... Take 1 capsule by mouth  once a day  His updated medication list for this problem includes:    Atenolol 100 Mg Tabs (Atenolol) .[name]... 1 two times a day    Catapres 0.1 Mg Tabs (Clonidine hcl) .[name]... Take 1 tablet by mouth at bedtime will decrease his Catapres to 0.1-mg at bedtime  Problem # 3:  HYPERCHOLESTEROLEMIA (ICD-272.0)  His updated medication list for this problem includes:    Lipitor 40 Mg Tabs (Atorvastatin calcium) .[name]... Take 1 tablet by mouth once a day   Problem # 4:  SYNDROME, CHRONIC PAIN (ICD-338.4)  Complete Medication List: 1)  Atenolol 100 Mg Tabs (Atenolol) .[name].. 1 two times a day 2)  Azasan 100 Mg Tabs (Azathioprine) .... Take 1 tablet by mouth two times a day 3)  Catapres 0.1 Mg Tabs (Clonidine hcl) .... Take 1 tablet by mouth at bedtime 4)  Cymbalta 30 Mg Cpep (Duloxetine hcl) .... 3 once daily 5)  Lipitor 40 Mg Tabs (Atorvastatin calcium) .... Take 1 tablet by mouth once a day 6)  Neurontin 300 Mg Caps (Gabapentin) .... 3 four times a day 7)  Prednisone 5 Mg Tabs (prednisone)  .[name].. 1 once a day 8)  Prevacid 30 Mg Cpdr (Lansoprazole) .... Take 1 capsule by mouth twice a day 9)  Wellbutrin Xl 300 Mg Tb24 (Bupropion hcl) .[name].. 1 once daily 10)  Wellbutrin Sr 150 Mg Tb12 (Bupropion hcl) .[name].. 1 once daily 11)  Flomax 0.4 Mg Cp24 (Tamsulosin hcl) .[name].. 1 once daily 12)  Ativan 1 Mg Tabs (Lorazepam) .... 2 q6h as needed 13)  Promethazine Hcl 25 Mg Tabs (Promethazine hcl) .[name].. 1 q6h as needed 14)  Restasis 0.05 % Emul (Cyclosporine) .... Once daily 15)  Tramadol Hcl 50 Mg Tabs (Tramadol hcl) .... One every 6 hrs for pain 16)  Ascensia Contour Test Strp (Glucose blood) .... Use 5 times day 17)  Cyanocobalamin 1000 Mcg/ml Inj Soln (Cyanocobalamin) .... Uad  Other Orders: Flu Vaccine 18yrs + (11914) Administration Flu vaccine (N8295)   Patient Instructions: 1)  Please schedule a follow-up appointment in 1 month. 2)  Limit your Sodium (Salt) to less than 2 grams a day(slightly less than 1/2 a  teaspoon) to prevent fluid retention, swelling, or worsening of symptoms. 3)  It is important that you exercise regularly at least 20 minutes 5 times a week. If you develop chest pain, have severe difficulty breathing, or feel very tired , stop exercising immediately and seek medical attention. 4)  Check your Blood Pressure regularly. If it is above: you should make an appointment.   ]

## 2010-10-06 NOTE — Letter (Signed)
Summary: [facility]  [facility]   Imported By: Maryln Gottron 02/09/2010 10:40:41  _____________________________________________________________________  External Attachment:    Type:   Image     Comment:   External Document

## 2010-10-06 NOTE — Letter (Signed)
Summary: [facility]  [facility] Provider: This provider was preselected by the workflow.  Signature: The signature status of this document was preset by the workflow  Processed by [employer] Local Indexer Client @ Tuesday, August 26, 2009 9:57:59 AM using version:2010.1.2.11(2.4)   Manually Indexed By: [id number]  idlBatchDetail: [id number]   _____________________________________________________________________  External Attachment:    Type:   Image     Comment:   External Document

## 2010-10-06 NOTE — Letter (Signed)
Summary: [facility]  [facility]   Imported By: Maryln Gottron 07/03/2009 15:15:52  _____________________________________________________________________  External Attachment:    Type:   Image     Comment:   External Document

## 2010-10-06 NOTE — Progress Notes (Signed)
Summary: Office Visit  Office Visit   Imported By: Kassie Mends 12/22/2007 12:57:50  _____________________________________________________________________  External Attachment:    Type:   Image     Comment:   office note

## 2010-10-06 NOTE — Progress Notes (Signed)
Summary: Flu sx, pt requesting RX  Phone Note Call from Patient Call back at [phone]   Caller: Patient Call For: [id number] Summary of Call: PT'S COMING DOWN WITH THE FLU NEED SOMETHING CALLED IN PHARM[facility]Y [facility] [address]      Initial call taken by: Shan Levans,  May 24, 2007 9:28 AM  Follow-up for Phone Call        Pt c/o throat itichy, congested and stuffy.  No fever now, pt has been taking Alka Seltzer, and Mucinex, does not seem to be helping.  Pt was instructed to force fluids and get plenty of rest.  We will call backwith Dr Vernon Prey recommendations Follow-up by: Sid Falcon LPN,  May 24, 2007 3:23 PM  Additional Follow-up for Phone Call Additional follow up Details #1::        Duratuss ac 12 6 oz two tsp q12h as needed cough and congestion    Additional Follow-up for Phone Call Additional follow up Details #2::    Med called in per Dr. Charm Rings order.  HT has to order med & because he is Medicaid and exceeds his 8 rxs per year he is restricted to picking up his meds there.  Left message for patient that med will probably be available tomorrow & that drugstore will call him when it is ready.   Follow-up by: Rudy Jew, RN,  May 25, 2007 6:46 PM

## 2010-10-06 NOTE — Letter (Signed)
Summary: [facility]  [facility]   Imported By: Maryln Gottron 03/05/2010 14:24:58  _____________________________________________________________________  External Attachment:    Type:   Image     Comment:   External Document

## 2010-10-06 NOTE — Assessment & Plan Note (Signed)
Summary: post hosp per melissa/ccm  Medications Added TRAMADOL HCL 50 MG  TABS (TRAMADOL HCL) one every 6 hrs for pain        Vital Signs:  Patient Profile:   45 Years Old Male Weight:      268 pounds O2 Sat:      97 % O2 treatment:    Room Air Temp:     98.6 degrees F oral Pulse rate:   84 / minute Pulse rhythm:   regular BP sitting:   122 / 84  (left arm) Cuff size:   large                 Chief Complaint:  F/u. Feels about the same.Marland Kitchen  History of Present Illness: 45 year old gentleman seen today for follow up.  He was hospitalized  recently for chest pain. One week ago.  He had a outpatient Cardiolite stress  test performed following  His discharge that was negative.  He does have a history of hypertrophic cardiomyopathyy.  He also has  a history of neurosarcoidosis and chronic pain syndrome and continues to have considerable left-sided chest pain, which is nonexertional, but  he does complain of dyspnea on exertion.  Has history of palpitations also and has been on atenolol 100 mg b.i.d..  He is to follow by cardiology and request follow-up appointment    Current Allergies: No known allergies   Past Medical History:    Depression    Hypertension    obstructive sleep apnea    B12 deficiency    chronic low back pain    pulmonary sarcoidosis    GERD    Osteoarthritis    hypertrophic cardiomyopathy     Review of Systems       The patient complains of chest pain and dyspnea on exhertion.     Physical Exam  General:     overweight-appearing.   Head:     Normocephalic and atraumatic without obvious abnormalities. No apparent alopecia or balding. Mouth:     Oral mucosa and oropharynx without lesions or exudates.  Teeth in good repair. Neck:     No deformities, masses, or tenderness noted. Lungs:     Normal respiratory effort, chest expands symmetrically. Lungs are clear to auscultation, no crackles or wheezes.  O2 saturation 98 Heart:     grade 2 to 3/6  high-pitched systolic murmur    Impression & Recommendations:  Problem # 1:  CHEST PAIN (ICD-786.50)  Problem # 2:  PALPITATIONS (ICD-785.1)  His updated medication list for this problem includes:    Atenolol 100 Mg Tabs (Atenolol) .Marland Kitchen... 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 Take 1 capsule by mouth once a day 2)  Atenolol 100 Mg Tabs (Atenolol) .Marland Kitchen.. 1 two times a day 3)  Azathioprine 100 Mg Tabs (azathioprine)  .... Take 2 tablet by mouth once a day 4)  Catapres 0.1 Mg Tabs (Clonidine hcl) .... Take 1 tablet by mouth twice a day 5)  Cymbalta 30 Mg Cpep (Duloxetine hcl) .... 3 once daily 6)  Lipitor 40 Mg Tabs (Atorvastatin calcium) .... Take 1 tablet by mouth once a day 7)  Neurontin 300 Mg Caps (Gabapentin) .... 3 four times a day 8)  Prednisone 20 Mg Tabs (Prednisone) .Marland Kitchen.. 1 once a day 9)  Prevacid 30 Mg Cpdr (Lansoprazole) .... Take 1  capsule by mouth twice a day 10)  Wellbutrin Xl 300 Mg Tb24 (Bupropion hcl) .Marland Kitchen.. 1 once daily 11)  Wellbutrin Sr 150 Mg Tb12 (Bupropion hcl) .Marland Kitchen.. 1 once daily 12)  Flomax 0.4 Mg Cp24 (Tamsulosin hcl) .Marland Kitchen.. 1 once daily 13)  Ativan 1 Mg Tabs (Lorazepam) .... 2 q6h as needed 14)  Promethazine Hcl 25 Mg Tabs (Promethazine hcl) .Marland Kitchen.. 1 q6h as needed 15)  Restasis 0.05 % Emul (Cyclosporine) .... Once daily 16)  Tramadol Hcl 50 Mg Tabs (Tramadol hcl) .... One every 6 hrs for pain   Patient Instructions: 1)  Please schedule a follow-up appointment in 2 months. 2)  Limit your Sodium (Salt). 3)  It is important that you exercise regularly at least 20 minutes 5 times a week. If you develop chest pain, have severe difficulty breathing, or feel very tired , stop exercising immediately and seek medical attention.    Prescriptions: TRAMADOL HCL 50 MG  TABS (TRAMADOL HCL) one every 6 hrs for pain   #180 x 4   Entered and Authorized by:   Gordy Savers  MD   Signed by:   Gordy Savers  MD on 10/27/2007   Method used:   Print then Give to Patient   RxID:   [id number] NEURONTIN 300 MG CAPS (GABAPENTIN) 3 four times a day  #270 x 6   Entered and Authorized by:   Gordy Savers  MD   Signed by:   Gordy Savers  MD on 10/27/2007   Method used:   Print then Give to Patient   RxID:   [id number] WELLBUTRIN SR 150 MG  TB12 (BUPROPION HCL) 1 once daily  #90 x 0   Entered and Authorized by:   Gordy Savers  MD   Signed by:   Gordy Savers  MD on 10/27/2007   Method used:   Print then Give to Patient   RxID:   [id number] WELLBUTRIN XL 300 MG  TB24 (BUPROPION HCL) 1 once daily  #90 x 0   Entered and Authorized by:   Gordy Savers  MD   Signed by:   Gordy Savers  MD on 10/27/2007   Method used:   Print then Give to Patient   RxID:   [id number] PREDNISONE 20 MG TABS (PREDNISONE) 1 once a day  #90 x 4   Entered and Authorized by:   Gordy Savers  MD   Signed by:   Gordy Savers  MD on 10/27/2007   Method used:   Print then Give to Patient   RxID:   [id number] CATAPRES 0.1 MG TABS (CLONIDINE HCL) Take 1 tablet by mouth twice a day  #180 x 6   Entered and Authorized by:   Gordy Savers  MD   Signed by:   Gordy Savers  MD on 10/27/2007   Method used:   Print then Give to Patient   RxID:   [id number] ATENOLOL 100 MG TABS (ATENOLOL) 1 two times a day  #180 x 6   Entered and Authorized by:   Gordy Savers  MD   Signed by:   Gordy Savers  MD on 10/27/2007   Method used:   Print then Give to Patient   RxID:   [id number] ALTACE 5 MG CAPS (RAMIPRIL) Take 1 capsule by mouth once a day  #90 x 2   Entered and Authorized by:   Gordy Savers  MD   Signed  by:   Gordy Savers  MD on 10/27/2007   Method used:   Print then Give to Patient   RxID:    [id number]  ]

## 2010-10-06 NOTE — Assessment & Plan Note (Signed)
Summary: sore throat/mhf   Vital Signs:  Patient Profile:   45 Years Old Male Weight:      266 pounds Temp:     98.2 degrees F oral BP sitting:   122 / 90  (left arm) Cuff size:   large  Vitals Entered By: Raechel Ache, RN (Jan 08, 2008 3:35 PM)                 Chief Complaint:  Since yesterday sore throat, face and eye swollen, eye hurt, headache, and swollen glands. Had 6 injections in back last week.Marland Kitchen  History of Present Illness: 45 year old patient with a two-day history of some head and chest congestion, cough, rhinorrhea.  He is has some associated headache and cough    Current Allergies: No known allergies   Past Medical History:    Reviewed history from 10/27/2007 and no changes required:       Depression       Hypertension       obstructive sleep apnea       B12 deficiency       chronic low back pain       pulmonary sarcoidosis       GERD       Osteoarthritis       hypertrophic cardiomyopathy      Physical Exam  General:     overweight-appearing.  no acute distress Head:     Normocephalic and atraumatic without obvious abnormalities. No apparent alopecia or balding. Eyes:     mild conjunctival injection Ears:     External ear exam shows no significant lesions or deformities.  Otoscopic examination reveals clear canals, tympanic membranes are intact bilaterally without bulging, retraction, inflammation or discharge. Hearing is grossly normal bilaterally. Nose:     External nasal examination shows no deformity or inflammation. Nasal mucosa are pink and moist without lesions or exudates. Mouth:     Oral mucosa and oropharynx without lesions or exudates.  Teeth in good repair. Neck:     No deformities, masses, or tenderness noted. Lungs:     Normal respiratory effort, chest expands symmetrically. Lungs are clear to auscultation, no crackles or wheezes. Heart:     Normal rate and regular rhythm. S1 and S2 normal without gallop, murmur, click, rub  or other extra sounds.    Impression & Recommendations:  Problem # 1:  URI (ICD-465.9)  His updated medication list for this problem includes:    Promethazine Hcl 25 Mg Tabs (Promethazine hcl) .Marland Kitchen... 1 q6h as needed   Problem # 2:  HYPERTENSION (ICD-401.9)  His updated medication list for this problem includes:    Altace 5 Mg Caps (Ramipril) .Marland Kitchen... Take 1 capsule by mouth once a day    Atenolol 100 Mg Tabs (Atenolol) .Marland Kitchen... 1 two times a day    Catapres 0.1 Mg Tabs (Clonidine hcl) .Marland Kitchen... Take 1 tablet by mouth twice a day   Complete Medication List: 1)  Altace 5 Mg Caps (Ramipril) .... Take 1 capsule by mouth once a day 2)  Atenolol 100 Mg Tabs (Atenolol) .Marland Kitchen.. 1 two times a day 3)  Azathioprine 100 Mg Tabs (azathioprine)  .... Take 2 tablet by mouth once a day 4)  Catapres 0.1 Mg Tabs (Clonidine hcl) .... Take 1 tablet by mouth twice a day 5)  Cymbalta 30 Mg Cpep (Duloxetine hcl) .... 3 once daily 6)  Lipitor 40 Mg Tabs (Atorvastatin calcium) .... Take 1 tablet by mouth once a day 7)  Neurontin 300 Mg Caps (Gabapentin) .... 3 four times a day 8)  Prednisone 20 Mg Tabs (Prednisone) .Marland Kitchen.. 1 once a day 9)  Prevacid 30 Mg Cpdr (Lansoprazole) .... Take 1 capsule by mouth twice a day 10)  Wellbutrin Xl 300 Mg Tb24 (Bupropion hcl) .Marland Kitchen.. 1 once daily 11)  Wellbutrin Sr 150 Mg Tb12 (Bupropion hcl) .Marland Kitchen.. 1 once daily 12)  Flomax 0.4 Mg Cp24 (Tamsulosin hcl) .Marland Kitchen.. 1 once daily 13)  Ativan 1 Mg Tabs (Lorazepam) .... 2 q6h as needed 14)  Promethazine Hcl 25 Mg Tabs (Promethazine hcl) .Marland Kitchen.. 1 q6h as needed 15)  Restasis 0.05 % Emul (Cyclosporine) .... Once daily 16)  Tramadol Hcl 50 Mg Tabs (Tramadol hcl) .... One every 6 hrs for pain 17)  Ascensia Contour Test Strp (Glucose blood) .... Use 5 times day 18)  Bentyl 10 Mg Caps (Dicyclomine hcl) .Marland Kitchen.. 1 qid   Patient Instructions: 1)  Please schedule a follow-up appointment in 3 months. 2)  Limit your Sodium (Salt) to less than 2 grams a day(slightly  less than 1/2 a teaspoon) to prevent fluid retention, swelling, or worsening of symptoms. 3)  Dolgic plus  one every 6 hours   ]

## 2010-10-06 NOTE — Progress Notes (Signed)
Summary: NO SHOW  Phone Note Outgoing Call   Call placed by: Raechel Ache, RN,  July 03, 2008 11:19 AM Summary of Call: no answer- missed appt.

## 2010-10-06 NOTE — Assessment & Plan Note (Signed)
Summary: 3 MNTH ROV//SLM   Vital Signs:  Patient profile:   45 year old male Weight:      242 pounds Temp:     98.6 degrees F oral Pulse rate:   72 / minute Pulse rhythm:   regular BP sitting:   100 / 80  (left arm) Cuff size:   large  Vitals Entered By: Raechel Ache, RN (October 08, 2009 8:13 AM) CC: 3 mo ROV   CC:  3 mo ROV.  History of Present Illness: 45 year old patient has a history of sarcoidosis.  [facility] is followed [facility].  He was hospitalized last month for noncardiac chest pain and has done well since his discharge.  He is scheduled for endoscopy later today due to swallowing difficulties.  He does have a history of chronic GERD and is on chronic PPI therapy.  Since his last office visit here.  He has gained a modest amount of weight.  He has obstructive sleep apnea, hypertension, B12 deficiency, and dyslipidemia.  His blood pressure today is well controlled.  He remains on Lipitor for hypercholesterolemia.  He has a history of depression, well controlled today  Allergies: No Known Drug Allergies  Past History:  Past Medical History: Depression Hypertension obstructive sleep apnea B12 deficiency chronic low back pain pulmonary sarcoidosis GERD Osteoarthritis hypertrophic cardiomyopathy impaired glucose tolerance Hyperlipidemia status post nephrectomy for left renal cancer  Past Surgical History: Cervicothoracic decompression and fusion-1998 Partial Nephrectomy 2007 status post heart catheterization 2000 Lumbar fusion March 2010  Social History: Reviewed history from 03/06/2007 and no changes required. Married Alcohol use-no Drug use-no  Review of Systems       The patient complains of chest pain and severe indigestion/heartburn.  The patient denies anorexia, fever, weight loss, weight gain, vision loss, decreased hearing, hoarseness, syncope, dyspnea on exertion, peripheral edema, prolonged cough, headaches, hemoptysis,  abdominal pain, melena, hematochezia, hematuria, incontinence, genital sores, muscle weakness, suspicious skin lesions, transient blindness, difficulty walking, depression, unusual weight change, abnormal bleeding, enlarged lymph nodes, angioedema, breast masses, and testicular masses.    Physical Exam  General:  overweight-appearing.  120/80overweight-appearing.   Head:  Normocephalic and atraumatic without obvious abnormalities. No apparent alopecia or balding. Ears:  External ear exam shows no significant lesions or deformities.  Otoscopic examination reveals clear canals, tympanic membranes are intact bilaterally without bulging, retraction, inflammation or discharge. Hearing is grossly normal bilaterally. Mouth:  Oral mucosa and oropharynx without lesions or exudates.  Teeth in good repair. Neck:  No deformities, masses, or tenderness noted. Lungs:  Normal respiratory effort, chest expands symmetrically. Lungs are clear to auscultation, no crackles or wheezes. Heart:  Normal rate and regular rhythm. S1 and S2 normal without gallop, murmur, click, rub or other extra sounds. Abdomen:  Bowel sounds positive,abdomen soft and non-tender without masses, organomegaly or hernias noted. Msk:  No deformity or scoliosis noted of thoracic or lumbar spine.   Pulses:  R and L carotid,radial,femoral,dorsalis pedis and posterior tibial pulses are full and equal bilaterally Extremities:  No clubbing, cyanosis, edema, or deformity noted with normal full range of motion of all joints.   Skin:  Intact without suspicious lesions or rashes Cervical Nodes:  No lymphadenopathy noted   Impression & Recommendations:  Problem # 1:  HYPERLIPIDEMIA (ICD-272.4)  His updated medication list for this problem includes:    Lipitor 40 Mg Tabs (Atorvastatin calcium) .Marland Kitchen... Take 1 tablet by mouth once a day  His updated medication list for this  problem includes:    Lipitor 40 Mg Tabs (Atorvastatin calcium) .Marland Kitchen... Take 1  tablet by mouth once a day  Problem # 2:  GERD (ICD-530.81)  His updated medication list for this problem includes:    Nexium 40 Mg Cpdr (Esomeprazole magnesium) ..... One daily    Dicyclomine Hcl 10 Mg Caps (Dicyclomine hcl) .Marland Kitchen... 1 qid follow-up endoscopy later today  His updated medication list for this problem includes:    Nexium 40 Mg Cpdr (Esomeprazole magnesium) ..... One daily    Dicyclomine Hcl 10 Mg Caps (Dicyclomine hcl) .Marland Kitchen... 1 qid  Problem # 3:  HYPERTENSION (ICD-401.9)  His updated medication list for this problem includes:    Catapres 0.1 Mg Tabs (Clonidine hcl) .Marland Kitchen... Take 1 tablet by mouth at bedtime    Atenolol 50 Mg Tabs (Atenolol) .Marland Kitchen... 1 two times a day  His updated medication list for this problem includes:    Catapres 0.1 Mg Tabs (Clonidine hcl) .Marland Kitchen... Take 1 tablet by mouth at bedtime    Atenolol 50 Mg Tabs (Atenolol) .Marland Kitchen... 1 two times a day  Problem # 4:  DEPRESSION (ICD-311)  His updated medication list for this problem includes:    Cymbalta 30 Mg Cpep (Duloxetine hcl) ..... Once daily    Wellbutrin Xl 300 Mg Tb24 (Bupropion hcl) .Marland Kitchen... 1 once daily    Wellbutrin Sr 150 Mg Tb12 (Bupropion hcl) .Marland Kitchen... 1 once daily    Cymbalta 60 Mg Cpep (Duloxetine hcl) ..... One daily    Valium 5 Mg Tabs (Diazepam) .Marland Kitchen... 1 two times a day as needed  His updated medication list for this problem includes:    Cymbalta 30 Mg Cpep (Duloxetine hcl) ..... Once daily    Wellbutrin Xl 300 Mg Tb24 (Bupropion hcl) .Marland Kitchen... 1 once daily    Wellbutrin Sr 150 Mg Tb12 (Bupropion hcl) .Marland Kitchen... 1 once daily    Cymbalta 60 Mg Cpep (Duloxetine hcl) ..... One daily    Valium 5 Mg Tabs (Diazepam) .Marland Kitchen... 1 two times a day as needed  Problem # 5:  HYPERCHOLESTEROLEMIA (ICD-272.0)  His updated medication list for this problem includes:    Lipitor 40 Mg Tabs (Atorvastatin calcium) .Marland Kitchen... Take 1 tablet by mouth once a day  His updated medication list for this problem includes:    Lipitor 40 Mg Tabs  (Atorvastatin calcium) .Marland Kitchen... Take 1 tablet by mouth once a day  Problem # 6:  SLEEP APNEA (ICD-780.57)  Complete Medication List: 1)  Azasan 100 Mg Tabs (Azathioprine) .... Take 1 tablet by mouth two times a day 2)  Catapres 0.1 Mg Tabs (Clonidine hcl) .... Take 1 tablet by mouth at bedtime 3)  Cymbalta 30 Mg Cpep (Duloxetine hcl) .... Once daily 4)  Lipitor 40 Mg Tabs (Atorvastatin calcium) .... Take 1 tablet by mouth once a day 5)  Neurontin 300 Mg Caps (Gabapentin) .... 3 four times a day 6)  Prednisone 5 Mg Tabs (prednisone)  .Marland Kitchen.. 1 once a day 7)  Wellbutrin Xl 300 Mg Tb24 (Bupropion hcl) .Marland Kitchen.. 1 once daily 8)  Wellbutrin Sr 150 Mg Tb12 (Bupropion hcl) .Marland Kitchen.. 1 once daily 9)  Flomax 0.4 Mg Cp24 (Tamsulosin hcl) .Marland Kitchen.. 1 once daily 10)  Restasis 0.05 % Emul (Cyclosporine) .... Once daily 11)  Tramadol Hcl 50 Mg Tabs (Tramadol hcl) .... One every 6 hrs for pain 12)  Ascensia Contour Test Strp (Glucose blood) .... Use 5 times day 13)  Cyanocobalamin 1000 Mcg/ml Inj Soln (Cyanocobalamin) .... Uad 14)  Atenolol 50  Mg Tabs (Atenolol) .Marland Kitchen.. 1 two times a day 15)  Cymbalta 60 Mg Cpep (Duloxetine hcl) .... One daily 16)  Nexium 40 Mg Cpdr (Esomeprazole magnesium) .... One daily 17)  Valium 5 Mg Tabs (Diazepam) .Marland Kitchen.. 1 two times a day as needed 18)  Promethazine Hcl 25 Mg Tabs (Promethazine hcl) .Marland Kitchen.. 1 q6h as needed nausea 19)  Oxybutynin Chloride 5 Mg Xr24h-tab (Oxybutynin chloride) .Marland Kitchen.. 1 two times a day 20)  Dicyclomine Hcl 10 Mg Caps (Dicyclomine hcl) .Marland Kitchen.. 1 qid  Patient Instructions: 1)  Please schedule a follow-up appointment in 6 months. 2)  Limit your Sodium (Salt). 3)  Avoid foods high in acid (tomatoes, citrus juices, spicy foods). Avoid eating within two hours of lying down or before exercising. Do not over eat; try smaller more frequent meals. Elevate head of bed twelve inches when sleeping. 4)  It is important that you exercise regularly at least 20 minutes 5 times a week. If you  develop chest pain, have severe difficulty breathing, or feel very tired , stop exercising immediately and seek medical attention. 5)  You need to lose weight. Consider a lower calorie diet and regular exercise.  6)  Check your blood sugars regularly. If your readings are usually above : or below 70 you should contact our office. 7)  It is important that your Diabetic A1c level is checked every 3 months. 8)  Check your Blood Pressure regularly. If it is above: 150/90  you should make an appointment. Prescriptions: NEXIUM 40 MG CPDR (ESOMEPRAZOLE MAGNESIUM) one daily Brand medically necessary #90 Capsule x 2   Entered and Authorized by:   Gordy Savers  MD   Signed by:   Gordy Savers  MD on 10/08/2009   Method used:   Print then Give to Patient   RxID:   [id number] CYMBALTA 60 MG CPEP (DULOXETINE HCL) one daily  #90 x 6   Entered and Authorized by:   Gordy Savers  MD   Signed by:   Gordy Savers  MD on 10/08/2009   Method used:   Print then Give to Patient   RxID:   [id number] ATENOLOL 50 MG TABS (ATENOLOL) 1 two times a day  #180 x 6   Entered and Authorized by:   Gordy Savers  MD   Signed by:   Gordy Savers  MD on 10/08/2009   Method used:   Print then Give to Patient   RxID:   [id number] CYANOCOBALAMIN 1000 MCG/ML INJ SOLN (CYANOCOBALAMIN) UAD  #30 [place] x 10   Entered and Authorized by:   Gordy Savers  MD   Signed by:   Gordy Savers  MD on 10/08/2009   Method used:   Print then Give to Patient   RxID:   [id number] ASCENSIA CONTOUR TEST   STRP (GLUCOSE BLOOD) use 5 times day  #150 x 11   Entered and Authorized by:   Gordy Savers  MD   Signed by:   Gordy Savers  MD on 10/08/2009   Method used:   Print then Give to Patient   RxID:   [id number] FLOMAX 0.4 MG  CP24 (TAMSULOSIN HCL) 1 once daily  #90 x 6   Entered and Authorized by:   Gordy Savers  MD   Signed by:   Gordy Savers  MD on 10/08/2009   Method used:   Print then Give to Patient   RxID:   [id number]  WELLBUTRIN SR 150 MG  TB12 (BUPROPION HCL) 1 once daily  #90 x 6   Entered and Authorized by:   Gordy Savers  MD   Signed by:   Gordy Savers  MD on 10/08/2009   Method used:   Print then Give to Patient   RxID:   [id number] WELLBUTRIN XL 300 MG  TB24 (BUPROPION HCL) 1 once daily  #90 x 6   Entered and Authorized by:   Gordy Savers  MD   Signed by:   Gordy Savers  MD on 10/08/2009   Method used:   Print then Give to Patient   RxID:   [id number] NEURONTIN 300 MG CAPS (GABAPENTIN) 3 four times a day  #270 x 6   Entered and Authorized by:   Gordy Savers  MD   Signed by:   Gordy Savers  MD on 10/08/2009   Method used:   Print then Give to Patient   RxID:   [id number] LIPITOR 40 MG TABS (ATORVASTATIN CALCIUM) Take 1 tablet by mouth once a day  #90 x 6   Entered and Authorized by:   Gordy Savers  MD   Signed by:   Gordy Savers  MD on 10/08/2009   Method used:   Print then Give to Patient   RxID:   [id number] CYMBALTA 30 MG  CPEP (DULOXETINE HCL) once daily  #90 x 6   Entered and Authorized by:   Gordy Savers  MD   Signed by:   Gordy Savers  MD on 10/08/2009   Method used:   Print then Give to Patient   RxID:   [id number] CATAPRES 0.1 MG TABS (CLONIDINE HCL) Take 1 tablet by mouth at bedtime  #90 x 6   Entered and Authorized by:   Gordy Savers  MD   Signed by:   Gordy Savers  MD on 10/08/2009   Method used:   Print then Give to Patient   RxID:   [id number]

## 2010-10-06 NOTE — Letter (Signed)
Summary: [facility]  [facility]   Imported By: Maryln Gottron 02/21/2009 12:13:42  _____________________________________________________________________  External Attachment:    Type:   Image     Comment:   External Document

## 2010-10-06 NOTE — Progress Notes (Signed)
Summary: bloodwork results pt saw dr swords 03-13-08  Phone Note Call from Patient Call back at [phone]   Caller: Patient Call For: dr Kirtland Bouchard Summary of Call: pt saw dr swords on 03-13-08 pt would like blood work results. Initial call taken by: Heron Sabins,  March 14, 2008 1:13 PM    Normal     Appended Document: bloodwork results pt saw dr swords 03-13-08 called.  Appended Document: bloodwork results pt saw dr swords 03-13-08 called.

## 2010-10-08 NOTE — Progress Notes (Signed)
Summary: Pt is req to get a psa test done per [facility]  Phone Note Call from Patient Call back at [facility] Phone [phone]   Caller: Patient Summary of Call: Pt has prostate surgery done by [facility] and they are req him to get a psa test done. Pt is requesting to get psa done at [facility] and then have the results faxed to [facility].Pt is needing to get this done today.  Pls advise.  Initial call taken by: Lucy Antigua,  September 29, 2010 8:16 AM  Follow-up for Phone Call        ok Follow-up by: Gordy Savers  MD,  September 29, 2010 9:37 AM  Additional Follow-up for Phone Call Additional follow up Details #1::        I called pt and sch him for psa, as noted above.  Additional Follow-up by: Lucy Antigua,  September 29, 2010 10:23 AM

## 2010-10-08 NOTE — Letter (Signed)
Summary: [facility]  [facility]   Imported By: Lanelle Bal 08/28/2010 10:23:48  _____________________________________________________________________  External Attachment:    Type:   Image     Comment:   External Document

## 2010-10-08 NOTE — Assessment & Plan Note (Signed)
Summary: fever, chills, chest congestion/dm   Vital Signs:  Patient profile:   45 year old male Weight:      234 pounds Temp:     99.5 degrees F oral BP sitting:   110 / 78  (right arm) Cuff size:   regular  Vitals Entered By: Duard Brady LPN (August 27, 2010 3:30 PM) CC: c/o cough, nausea no BM since surg last week, hemorrhoids bothering him, shakey feeling Is Patient Diabetic? No CBG Result 100   CC:  c/o cough, nausea no BM since surg last week, hemorrhoids bothering him, and shakey feeling.  History of Present Illness: 26 -year-old patient who is status post prostatectomy 10 days ago.  Since that time.  He states he has had some occasional chills that have worsened.  He is developed cough and low-grade fever.  He feels unwell.  Cough is nonproductive.  He has a history of impaired glucose tolerance which has been stable with much glycemic control.  He has treated dyslipidemia, and a history of a hypertrophic cardiomyopathy  Allergies (verified): No Known Drug Allergies  Past History:  Past Medical History: Reviewed history from 07/22/2010 and no changes required. Depression Hypertension obstructive sleep apnea B12 deficiency chronic low back pain pulmonary sarcoidosis GERD Osteoarthritis hypertrophic cardiomyopathy impaired glucose tolerance Hyperlipidemia status post nephrectomy for left renal cancer Nephrolithiasis, hx of prostate cancer  Review of Systems       The patient complains of anorexia, fever, and prolonged cough.  The patient denies weight loss, weight gain, vision loss, decreased hearing, hoarseness, chest pain, syncope, dyspnea on exertion, peripheral edema, headaches, hemoptysis, abdominal pain, melena, hematochezia, severe indigestion/heartburn, hematuria, incontinence, genital sores, muscle weakness, suspicious skin lesions, transient blindness, difficulty walking, depression, unusual weight change, abnormal bleeding, enlarged lymph nodes,  angioedema, breast masses, and testicular masses.    Physical Exam  General:  overweight-appearing.  no acute distress.  Blood pressure normal temperature 99.5 Head:  Normocephalic and atraumatic without obvious abnormalities. No apparent alopecia or balding. Eyes:  No corneal or conjunctival inflammation noted. EOMI. Perrla. Funduscopic exam benign, without hemorrhages, exudates or papilledema. Vision grossly normal. Ears:  External ear exam shows no significant lesions or deformities.  Otoscopic examination reveals clear canals, tympanic membranes are intact bilaterally without bulging, retraction, inflammation or discharge. Hearing is grossly normal bilaterally. Mouth:  Oral mucosa and oropharynx without lesions or exudates.  Teeth in good repair. Neck:  No deformities, masses, or tenderness noted. Lungs:  Normal respiratory effort, chest expands symmetrically. Lungs are clear to auscultation, no crackles or wheezes.  Heart:  Normal rate and regular rhythm. S1 and S2 normal without gallop, murmur, click, rub or other extra sounds.  no tachycardia Abdomen:  microscopic incisions are nicely healing Rectal:  No external abnormalities noted. Normal sphincter tone. No rectal masses or tenderness.   Impression & Recommendations:  Problem # 1:  FEVER (ICD-780.60)  Orders: Venipuncture (60454) TLB-BMP (Basic Metabolic Panel-BMET) (80048-METABOL) TLB-CBC Platelet - w/Differential (85025-CBCD) Specimen Handling (09811)  Problem # 2:  URI (ICD-465.9)  His updated medication list for this problem includes:    Promethazine Hcl 25 Mg Tabs (Promethazine hcl) .Marland Kitchen... 1 q6h as needed nausea  Problem # 3:  CARDIOMYOPATHY, IDIOPATHIC HYPERTROPHIC (ICD-425.4)  Problem # 4:  HYPERTENSION (ICD-401.9)  His updated medication list for this problem includes:    Catapres 0.1 Mg Tabs (Clonidine hcl) .Marland Kitchen... Take 1 tablet by mouth at bedtime    Atenolol 50 Mg Tabs (Atenolol) .Marland Kitchen... 1 two times a  day  Complete Medication List: 1)  Azasan 100 Mg Tabs (Azathioprine) .... Take 1 tablet by mouth two times a day 2)  Catapres 0.1 Mg Tabs (Clonidine hcl) .... Take 1 tablet by mouth at bedtime 3)  Cymbalta 30 Mg Cpep (Duloxetine hcl) .... Once daily 4)  Lipitor 40 Mg Tabs (Atorvastatin calcium) .... Take 1 tablet by mouth once a day 5)  Neurontin 300 Mg Caps (Gabapentin) .... 3 four times a day 6)  Prednisone 5 Mg Tabs (prednisone)  .Marland Kitchen.. 1 once a day 7)  Wellbutrin Xl 300 Mg Tb24 (Bupropion hcl) .Marland Kitchen.. 1 once daily 8)  Wellbutrin Sr 150 Mg Tb12 (Bupropion hcl) .Marland Kitchen.. 1 once daily 9)  Flomax 0.4 Mg Cp24 (Tamsulosin hcl) .Marland Kitchen.. 1 once daily 10)  Restasis 0.05 % Emul (Cyclosporine) .... Once daily 11)  Tramadol Hcl 50 Mg Tabs (Tramadol hcl) .... One every 6 hrs for pain 12)  Ascensia Contour Test Strp (Glucose blood) .... Use 5 times day 13)  Cyanocobalamin 1000 Mcg/ml Inj Soln (Cyanocobalamin) .... Uad 14)  Atenolol 50 Mg Tabs (Atenolol) .Marland Kitchen.. 1 two times a day 15)  Cymbalta 60 Mg Cpep (Duloxetine hcl) .... One daily 16)  Nexium 40 Mg Cpdr (Esomeprazole magnesium) .... One daily 17)  Valium 5 Mg Tabs (Diazepam) .Marland Kitchen.. 1 two times a day as needed 18)  Promethazine Hcl 25 Mg Tabs (Promethazine hcl) .Marland Kitchen.. 1 q6h as needed nausea 19)  Oxybutynin Chloride 5 Mg Xr24h-tab (Oxybutynin chloride) .Marland Kitchen.. 1 two times a day 20)  Contour Usb Blood Glucose Sys W/device Kit (Blood glucose monitoring suppl) .... Use daily to check your blood sugar 21)  Bentyl 10 Mg Caps (Dicyclomine hcl) .Marland Kitchen.. 1 by mouth 4 times daily 22)  Avelox 400 Mg Tabs (Moxifloxacin hcl) .... One daily  Other Orders: Capillary Blood Glucose/CBG (11914)  Patient Instructions: 1)  Get plenty of rest, drink lots of clear liquids, and use Tylenol or Ibuprofen for fever and comfort. Return in 7-10 days if you're not better:sooner if you're feeling worse. 2)  Take your antibiotic as prescribed until ALL of it is gone, but stop if you develop a rash or  swelling and contact our office as soon as possible. Prescriptions: AVELOX 400 MG TABS (MOXIFLOXACIN HCL) one daily  #7 x 0   Entered and Authorized by:   Gordy Savers  MD   Signed by:   Gordy Savers  MD on 08/27/2010   Method used:   Print then Give to Patient   RxID:   [id number]    Orders Added: 1)  Capillary Blood Glucose/CBG [82948] 2)  Est. Patient Level IV [69629] 3)  Venipuncture [52841] 4)  TLB-BMP (Basic Metabolic Panel-BMET) [80048-METABOL] 5)  TLB-CBC Platelet - w/Differential [85025-CBCD] 6)  Specimen Handling [99000]

## 2010-10-08 NOTE — Letter (Signed)
Summary: [facility]  [facility]   Imported By: Maryln Gottron 08/19/2010 11:31:01  _____________________________________________________________________  External Attachment:    Type:   Image     Comment:   External Document

## 2010-10-08 NOTE — Letter (Signed)
Summary: Appointments, Instructions, Medications/[facility]  Appointments, Instructions, Medications/[facility]   Imported By: Maryln Gottron 09/25/2010 13:20:23  _____________________________________________________________________  External Attachment:    Type:   Image     Comment:   External Document

## 2010-10-08 NOTE — Letter (Signed)
Summary: [facility]  [facility]   Imported By: Maryln Gottron 10/01/2010 11:11:25  _____________________________________________________________________  External Attachment:    Type:   Image     Comment:   External Document

## 2010-10-09 NOTE — Letter (Signed)
Summary: [facility]  [facility]   Imported By: Maryln Gottron 04/03/2009 08:44:42  _____________________________________________________________________  External Attachment:    Type:   Image     Comment:   External Document

## 2010-10-09 NOTE — Letter (Signed)
Summary: [facility]  [facility]   Imported By: Maryln Gottron 10/27/2009 09:52:33  _____________________________________________________________________  External Attachment:    Type:   Image     Comment:   External Document

## 2010-10-09 NOTE — Letter (Signed)
Summary: [facility]  [facility]   Imported By: Maryln Gottron 04/03/2010 09:38:33  _____________________________________________________________________  External Attachment:    Type:   Image     Comment:   External Document

## 2010-10-09 NOTE — Letter (Signed)
Summary: [facility]  [facility]   Imported By: Maryln Gottron 11/11/2009 14:33:38  _____________________________________________________________________  External Attachment:    Type:   Image     Comment:   External Document

## 2010-10-09 NOTE — Letter (Signed)
Summary: [facility]  [facility]   Imported By: Maryln Gottron 09/23/2009 11:16:44  _____________________________________________________________________  External Attachment:    Type:   Image     Comment:   External Document

## 2010-10-09 NOTE — Letter (Signed)
Summary: [facility]  [facility]   Imported By: Maryln Gottron 01/05/2010 13:06:34  _____________________________________________________________________  External Attachment:    Type:   Image     Comment:   External Document

## 2010-10-09 NOTE — Letter (Signed)
Summary: [facility]  [facility]   Imported By: Maryln Gottron 07/23/2010 09:51:13  _____________________________________________________________________  External Attachment:    Type:   Image     Comment:   External Document

## 2010-11-02 ENCOUNTER — Other Ambulatory Visit: Payer: Self-pay | Admitting: Internal Medicine

## 2010-11-12 ENCOUNTER — Other Ambulatory Visit: Payer: Self-pay | Admitting: Internal Medicine

## 2010-11-21 ENCOUNTER — Other Ambulatory Visit: Payer: Self-pay | Admitting: Internal Medicine

## 2010-11-21 DIAGNOSIS — R52 Pain, unspecified: Secondary | ICD-10-CM

## 2010-11-22 LAB — BASIC METABOLIC PANEL
BUN: 9 mg/dL (ref 6–23)
CO2: 26 mEq/L (ref 19–32)
Calcium: 9.7 mg/dL (ref 8.4–10.5)
Chloride: 106 mEq/L (ref 96–112)
Creatinine, Ser: 1.02 mg/dL (ref 0.4–1.5)
GFR calc Af Amer: >60 mL/min (ref >60)
GFR calc non Af Amer: >60 mL/min (ref >60)
Glucose, Bld: 99 mg/dL (ref 70–99)
Potassium: 4.8 mEq/L (ref 3.5–5.1)
Sodium: 138 mEq/L (ref 135–145)

## 2010-11-22 LAB — SURGICAL PCR SCREEN
MRSA, PCR: NEGATIVE
Staphylococcus aureus: NEGATIVE

## 2010-11-22 LAB — CBC
HCT: 43.1 % (ref 39.0–52.0)
Hemoglobin: 14.8 g/dL (ref 13.0–17.0)
MCH: 30.9 pg (ref 26.0–34.0)
MCHC: 34.3 g/dL (ref 30.0–36.0)
MCV: 90.2 fL (ref 78.0–100.0)
Platelets: 270 10*3/uL (ref 150–400)
RBC: 4.78 MIL/uL (ref 4.22–5.81)
RDW: 15.3 % (ref 11.5–15.5)
WBC: 3 10*3/uL — ABNORMAL LOW (ref 4.0–10.5)

## 2010-12-01 ENCOUNTER — Encounter: Payer: Self-pay | Admitting: Internal Medicine

## 2010-12-03 ENCOUNTER — Other Ambulatory Visit: Payer: Self-pay | Admitting: Internal Medicine

## 2010-12-07 ENCOUNTER — Other Ambulatory Visit: Payer: Self-pay | Admitting: Internal Medicine

## 2010-12-11 ENCOUNTER — Emergency Department (HOSPITAL_COMMUNITY): Payer: Medicare Other

## 2010-12-11 ENCOUNTER — Emergency Department (HOSPITAL_COMMUNITY)
Admission: EM | Admit: 2010-12-11 | Discharge: 2010-12-11 | Disposition: A | Discharge status: Home / Self Care | Payer: Medicare Other | Attending: Emergency Medicine | Admitting: Emergency Medicine

## 2010-12-11 DIAGNOSIS — L02519 Cutaneous abscess of unspecified hand: Secondary | ICD-10-CM | POA: Insufficient documentation

## 2010-12-11 DIAGNOSIS — I10 Essential (primary) hypertension: Secondary | ICD-10-CM | POA: Insufficient documentation

## 2010-12-11 DIAGNOSIS — M7989 Other specified soft tissue disorders: Secondary | ICD-10-CM | POA: Insufficient documentation

## 2010-12-11 DIAGNOSIS — S6990XA Unspecified injury of unspecified wrist, hand and finger(s), initial encounter: Secondary | ICD-10-CM | POA: Insufficient documentation

## 2010-12-11 DIAGNOSIS — S60229A Contusion of unspecified hand, initial encounter: Secondary | ICD-10-CM | POA: Insufficient documentation

## 2010-12-11 DIAGNOSIS — D869 Sarcoidosis, unspecified: Secondary | ICD-10-CM | POA: Insufficient documentation

## 2010-12-11 DIAGNOSIS — Y92009 Unspecified place in unspecified non-institutional (private) residence as the place of occurrence of the external cause: Secondary | ICD-10-CM | POA: Insufficient documentation

## 2010-12-11 DIAGNOSIS — M79609 Pain in unspecified limb: Secondary | ICD-10-CM | POA: Insufficient documentation

## 2010-12-11 DIAGNOSIS — M549 Dorsalgia, unspecified: Secondary | ICD-10-CM | POA: Insufficient documentation

## 2010-12-11 DIAGNOSIS — Z79899 Other long term (current) drug therapy: Secondary | ICD-10-CM | POA: Insufficient documentation

## 2010-12-11 DIAGNOSIS — I2510 Atherosclerotic heart disease of native coronary artery without angina pectoris: Secondary | ICD-10-CM | POA: Insufficient documentation

## 2010-12-11 DIAGNOSIS — L03119 Cellulitis of unspecified part of limb: Secondary | ICD-10-CM | POA: Insufficient documentation

## 2010-12-11 DIAGNOSIS — G8929 Other chronic pain: Secondary | ICD-10-CM | POA: Insufficient documentation

## 2010-12-11 DIAGNOSIS — IMO0002 Reserved for concepts with insufficient information to code with codable children: Secondary | ICD-10-CM | POA: Insufficient documentation

## 2010-12-17 LAB — CBC
HCT: 36 % — ABNORMAL LOW (ref 39.0–52.0)
HCT: 42.4 % (ref 39.0–52.0)
Hemoglobin: 12.6 g/dL — ABNORMAL LOW (ref 13.0–17.0)
Hemoglobin: 14.5 g/dL (ref 13.0–17.0)
MCHC: 34.3 g/dL (ref 30.0–36.0)
MCHC: 34.9 g/dL (ref 30.0–36.0)
MCV: 86.3 fL (ref 78.0–100.0)
MCV: 86.9 fL (ref 78.0–100.0)
Platelets: 235 10*3/uL (ref 150–400)
Platelets: 296 10*3/uL (ref 150–400)
RBC: 4.18 MIL/uL — ABNORMAL LOW (ref 4.22–5.81)
RBC: 4.88 MIL/uL (ref 4.22–5.81)
RDW: 14 % (ref 11.5–15.5)
RDW: 14.7 % (ref 11.5–15.5)
WBC: 5.2 10*3/uL (ref 4.0–10.5)
WBC: 7.5 10*3/uL (ref 4.0–10.5)

## 2010-12-17 LAB — BASIC METABOLIC PANEL
BUN: 11 mg/dL (ref 6–23)
BUN: 9 mg/dL (ref 6–23)
CO2: 25 mEq/L (ref 19–32)
CO2: 25 mEq/L (ref 19–32)
Calcium: 8.3 mg/dL — ABNORMAL LOW (ref 8.4–10.5)
Calcium: 9.3 mg/dL (ref 8.4–10.5)
Chloride: 103 mEq/L (ref 96–112)
Chloride: 105 mEq/L (ref 96–112)
Creatinine, Ser: 0.83 mg/dL (ref 0.4–1.5)
Creatinine, Ser: 0.98 mg/dL (ref 0.4–1.5)
GFR calc Af Amer: >60 mL/min (ref >60)
GFR calc Af Amer: >60 mL/min (ref >60)
GFR calc non Af Amer: >60 mL/min (ref >60)
GFR calc non Af Amer: >60 mL/min (ref >60)
Glucose, Bld: 104 mg/dL — ABNORMAL HIGH (ref 70–99)
Glucose, Bld: 98 mg/dL (ref 70–99)
Potassium: 3.9 mEq/L (ref 3.5–5.1)
Potassium: 4.5 mEq/L (ref 3.5–5.1)
Sodium: 134 mEq/L — ABNORMAL LOW (ref 135–145)
Sodium: 136 mEq/L (ref 135–145)

## 2010-12-17 LAB — TYPE AND SCREEN
ABO/RH(D): O POS
Antibody Screen: NEGATIVE

## 2010-12-17 LAB — ABO/RH: ABO/RH(D): O POS

## 2011-01-05 ENCOUNTER — Other Ambulatory Visit: Payer: Self-pay | Admitting: Internal Medicine

## 2011-01-07 ENCOUNTER — Other Ambulatory Visit: Payer: Self-pay | Admitting: Internal Medicine

## 2011-01-11 ENCOUNTER — Encounter: Payer: Self-pay | Admitting: Internal Medicine

## 2011-01-18 ENCOUNTER — Encounter: Payer: Self-pay | Admitting: Internal Medicine

## 2011-01-19 NOTE — H&P (Signed)
NAMEWILKES, POTVIN NO.:  [id number]   MEDICAL RECORD NO.:  [id number]          PATIENT TYPE:  EMS   LOCATION:  [facility]                         FACILITY:  [facility]   PHYSICIAN:  Hollice Espy, M.D.DATE OF BIRTH:  11/26/1965   DATE OF ADMISSION:  10/12/2007  DATE OF DISCHARGE:                              HISTORY & PHYSICAL   PRIMARY CARE PHYSICIAN:  Gordy Savers, MD   CONSULTANTS ON THIS CASE:  Madolyn Frieze. Jens Som, MD, [facility] --  Cardiology.   CHIEF COMPLAINT:  Chest pain.   HISTORY OF PRESENT ILLNESS:  The patient is a 45 year old white male  with past medical history of sarcoidosis as well as previous episode of  chest pain in the past, who has had an extensive workup, who presents  today with complaints of the same.  He complains of while at rest, he  started having onset of left breast aching with some associated  shortness of breath.  He also felt dizzy and lightheaded, some mild  nausea.  Symptoms persisted.  He was given some nitroglycerin and  aspirin after coming to the emergency room, which had improved his  symptoms, but they have returned.   EKG showed an essentially normal sinus rhythm.  No evidence of any  severe ST or T-wave elevations or depressions.  His chest x-ray and labs  were unremarkable, as well and include normal cardiac markers.  Currently the patient has some continued ongoing chest aching over his  left breast.  Denies any headaches, vision changes, or dysphagia.  He  also complains of occasional palpitations.  No current shortness of  breath, no wheezing or coughing.  No abdominal pain.  No hematuria,  dysuria, or constipation, diarrhea, focal showing numbness, weakness, or  pain.   REVIEW OF SYSTEMS:  Otherwise negative.   PAST MEDICAL HISTORY:  Includes sarcoidosis, depression, chronic back  pain such as sleep apnea, spinal stenosis, B12 deficiency, dyslipidemia,  arthritis, and GERD.   MEDICATIONS:  The  patient remembers his medicines, but not the doses.  We are taking the doses from his previous discharge summary in July of  2008.  He is on Atenolol 100 b.i.d., duloxetine 90 p.o. daily,  Wellbutrin 450 daily, Neurontin 900 p.o. 4 times a day, Lipitor 40  daily, guaifenesin 600 b.i.d., prednisone 20 daily, Ativan 1 p.o. t.i.d.  p.r.n., Altace 5, Cymbalta 90, Restasis eye drops b.i.d., clonidine 0.1  nightly, and Flomax 0.4 daily, as well as azathioprine 100 p.o. b.i.d.   ALLERGIES:  HE HAS NO KNOWN DRUG ALLERGIES.   SOCIAL HISTORY:  Denies any tobacco, alcohol, or drug use.   FAMILY HISTORY:  Noncontributory.  Negative for any serious cardiac  issues.   PHYSICAL EXAMINATION:  VITAL SIGNS:  On admission temp 97.9, heart rate  98, blood pressure 128/84, respirations 28, O2 sat 95% on room air.  GENERAL:  In general he is alert and oriented x3, in no apparent  distress.  HEENT:  Normocephalic, atraumatic.  His mucus membranes slightly dry.  He has no carotid bruits.  His right eye looks to be slightly irritated.  HEART:  Regular rate and rhythm.  S1, S2.  LUNGS:  Clear to auscultation bilaterally.  ABDOMEN:  Soft, nontender, nondistended.  Positive bowel sounds.  EXTREMITIES:  Show no clubbing, cyanosis, or edema.   LABORATORY DATA:  Chest x-ray is unremarkable.  EKG is as per HPI.  White count 7.2.  H and H 14.6 and 44.  MCV of 87, platelet count 366,  78% shift.  CPK 30.4, MB less than 1, troponin less than 0.05.  Sodium  140, potassium 4.2, chloride 108, bicarb 23, BUN 16, creatinine 1,  glucose 113.   ASSESSMENT AND PLAN:  1. Chest pain.  In review, the patient's previous [facility]      note when they were consulted in July 2008, they felt that the      patient's symptoms are unusual for cardiac ischemia and they also      noted previous cardiac catheterization in 2003, as well as a recent      Myoview, which was negative at that time.  An echocardiogram is      done  to quantify left ventricular function, which is not at 50% and      rule out sarcoid involvement of his heart.  The only thing that was      noted was a scheduled outpatient thallium study, which the patient      is not a good historian and cannot remember having this done.  Will      plan to keep the patient overnight for observation, check 2 more      sets of cardiac markers.  In the morning will call [facility] and see if he has had a thallium outpatient scan.  If he      has, then this is likely chronic pain issues, perhaps      musculoskeletal related and not cardiac.  We will discharge him      hom.  2. Sarcoidosis.  Continue azathioprine, prednisone.      Hollice Espy, M.D.  Electronically Signed     SKK/MEDQ  D:  10/12/2007  T:  10/12/2007  Job:  [id number]   cc:   Madolyn Frieze. Jens Som, MD, [facility]  Gordy Savers, MD

## 2011-01-19 NOTE — H&P (Signed)
NAME:  QUIN, MATHENIA NO.:  [id number]   MEDICAL RECORD NO.:  [id number]          PATIENT TYPE:  EMS   LOCATION:  [facility]                         FACILITY:  [facility]   PHYSICIAN:  Lucita Ferrara, MD         DATE OF BIRTH:  [birth year]   DATE OF ADMISSION:  05/26/2008  DATE OF DISCHARGE:                              HISTORY & PHYSICAL   PRIMARY CARE PHYSICIAN:  Gordy Savers, MD.   CARDIOLOGIST:  Seen in the past was Dr. Madolyn Frieze. Jens Som, of [facility].   CHIEF COMPLAINT:  Chest pain.   HISTORY OF PRESENT ILLNESS:  The patient is a 45 year old African  American male with a chief complaint of chest pain located left anterior  precordial area which started earlier in the daytime and gradually  getting worse upon presentation to the emergency room, 10/10 in  intensity, described as pressure-like and has not abided with any  interventions.  There basically are not any alleviating or worsening  factors.  He denies any fevers, chills, cough.  He denies association  with food or gastroesophageal reflux disease.  He reportedly has a  history of sarcoidosis that is also documented to be neurological in its  association as well, hypertension and hyperlipidemia.  The patient also  tells me that he has a history of hypertrophic obstructive  cardiomyopathy for which there is no clear documentation as far as the  cardiologist ever documenting this.  The patient also complains of  dizziness that is associated with his chest pain.   PAST MEDICAL HISTORY:  Significant for:  1. Recurrent chest pain deemed to be atypical, previously he has      undergone several Myoviews.  2. A questionable history of hypertrophic obstructive cardiomyopathy      with repeat echocardiogram dated February 2009 showing an overall      ejection fraction of 65-70% with no wall abnormalities but moderate      focal basil septal hypertrophy overall consistent with the Doppler      parameters  consistent with abnormal left ventricular relaxation.  3. History of depression.  4. Sleep apnea.  5. Hyperlipidemia.  6. Palpitations deemed to be premature ventricular contractions in the      past.  7. Chronic back pain.  8. Spinal stenosis.  9. History of B12 deficiency.  10.Gastroesophageal reflux disease.   FAMILY HISTORY:  Noncontributory.   PAST SURGICAL HISTORY:  Status post nephrectomy.   SOCIAL HISTORY:  Denies drugs, alcohol or tobacco.   MEDICATIONS:  1. The patient is on nitroglycerin sublingual.  2. Tylenol 100 mg b.i.d.  3. Cymbalta.  4. Wellbutrin.  5. Neurontin.  6. Lipitor.  7. Altace.  8. Mucinex.  9. Prednisone.  10.Ativan.  11.Clonidine.  12.Flomax.  13.Azathioprine.  14.Restasis.   ALLERGIES:  No known drug allergies.   PHYSICAL EXAMINATION:  GENERAL:  Generally speaking the patient is in no  acute distress.  VITAL SIGNS:  Blood pressure is 134/76, pulse 65, respirations 16,  temperature 98.1, pulse ox 96% room air.  HEENT:  Normocephalic,  atraumatic.  Sclerae is anicteric.  PERRLA.  Extraocular muscles intact.  The right eye looks to be slightly irritated.  NECK:  Supple.  No JVD.  No carotid bruits.  CARDIOVASCULAR:  S1-S2, regular rate and rhythm.  No murmurs, rubs or  clicks.  LUNGS:  Clear to auscultation bilaterally without rhonchi, rales or  wheezes.  EXTREMITIES:  No clubbing, cyanosis or edema.  NEUROLOGICAL:  Alert and oriented x3.  Cranial nerves II-XII grossly  intact.   Emergency room workup, it was felt that since the patient has cardiac  risk factors of family history of cardiac risk factors the patient's  continued dizziness that is associated with his chest pain given the  patient's symptoms it was felt by emergency room physician to undergo  evaluation here in the hospital.  EKG shows sinus bradycardia.  No ST-T  wave changes and no changes from October 13, 2007.  Chest x-ray shows  cardiomegaly with vascular congestion  and bibasilar atelectasis.  First  set of cardiac enzymes negative.  Basic metabolic panel within normal  limits.  CBC within normal limits.  BNP not done or D-dimer also not  done and those results are pending currently.   ASSESSMENT:  1. Recurrent atypical chest pain with an extensive workup in the past      and sees cardiology as outpatient as well, Dr. Jens Som.  2. Documented history of hypertrophic obstructive cardiomyopathy with      echocardiograms never showing any evidence of this per cardiology      documentation.  3. Dizziness with no focal neurological deficits.  4. History of palpitation and premature ventricular contractions in      the past.  5. Sarcoidosis.  6. History depression.  7. Sleep apnea.  8. Hyperlipidemia.  9. Hypertension.   PLAN:  For chest pain will go ahead and admit the patient to medical  telemetry unit.  Get a set of cardiac enzymes x3 q.8 hours.  I do think  the patient is likely at his baseline and does not look like he is  undergoing any CHF exacerbation.  Chest pain is very atypical and per  previous documentations he has undergone numerous stress tests.  I will  defer to cardiology recommendations in regards to undergoing any other  type of evaluation such as a catheterization.  In regards to his  sarcoidosis he seems to be also compensated.  Will continue his  azathioprine and prednisone.  Will get a D-dimer and if D-dimer is  negative we will not pursue any CT angiography but if it is high I do  think that he would benefit from this at this point.  For obstructive  sleep apnea will continue bedtime positive airway pressure and will  continue the rest of his medications and put him on DVT and GI  prophylaxis.  The rest of the plans are really dependent on his progress  and cardiology recommendations.      Lucita Ferrara, MD  Electronically Signed     RR/MEDQ  D:  05/26/2008  T:  05/26/2008  Job:  [id number]

## 2011-01-19 NOTE — Op Note (Signed)
NAME:  [name], [name] NO.:  [id number]   MEDICAL RECORD NO.:  [id number]          PATIENT TYPE:  INP   LOCATION:  3110                         FACILITY:  [facility]   PHYSICIAN:  Cristi Loron, M.D.DATE OF BIRTH:  04/18/1966   DATE OF PROCEDURE:  11/13/2008  DATE OF DISCHARGE:                               OPERATIVE REPORT   BRIEF HISTORY:  The patient is a 45 year old black male who has suffered  from chronic back and leg pain.  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  I performed a  decompressive laminectomy a few years ago and he seemed initially  improved.  More recently, he has had chronic debilitating back pain.  He  failed extensive nonsurgical management and was worked up with a lumbar  MRI, lumbar diskogram, etc., demonstrated the patient had degeneration  and concordant pain at L3-4, L4-5, and L5-S1.  I discussed the various  treatment options with the patient including surgery.  The patient has  weighed the risks, benefits, and alternatives of surgery and decided  proceeding with a L3-4, L4-5, L5-S1 decompression, instrumentation, and  fusion.   PREOPERATIVE STENOSES:  L3-4, L4-5, L5-S1 disk degeneration, lumbago,  spinal stenosis, lumbar radiculopathy.   POSTOPERATIVE DIAGNOSES:  L3-4, L4-5, L5-S1 disk degeneration, lumbago,  spinal stenosis, lumbar radiculopathy.   PROCEDURE:  Redo L3, L4, and L5 laminectomy to decompress the bilateral  L3, L4, L5, and S1 nerve roots; L3-4, L4-5, L5-S1 posterior lumbar  interbody fusion with local morselized autograft bone and  Actifuse/Vitoss bone graft extender and bone morphogenic protein;  insertion of L3-4, L4-5, L5-S1 interbody prosthesis (Capstone PEEK  interbody prosthesis); posterior segmental instrumentation at L3-S1 with  Legacy titanium pedicle screws and rods; L3-4, L4-5, L5-S1  posterolateral arthrodesis with local morselized autograft bone  and bone  morphogenic protein and Vitoss/Actifuse bone graft extenders.   SURGEON:  Cristi Loron, MD   ASSISTANT:  Stefani Dama, MD   ANESTHESIA:  General endotracheal.   ESTIMATED BLOOD LOSS:  1400 mL.   SPECIMENS:  None.   DRAINS:  One epidural Jackson-Pratt drain.   COMPLICATIONS:  None.   DESCRIPTION OF PROCEDURE:  The patient was brought to the operating room  by anesthesia team.  General endotracheal anesthesia was induced.  The  patient was carefully turned into the prone position on the Wilson  frame.  His lumbosacral region was then shaved with clippers and  prepared with Betadine scrub and Betadine solution.  Sterile drapes were  applied.  I then injected the area to be incised with Marcaine with  epinephrine solution, a scalpel to make a linear midline incision over  the L3-4, L4-5, and L5-S1 interspaces.  I used electrocautery to perform  a bilateral subperiosteal section exposing the spinous process of L2, in  the upper sacrum as well as the facets at L3-4, L4-5, and L5-S1.  We  obtained intraoperative radiograph to confirm our location.  We inserted  the first Versa-Trac retractor for exposure.   Because of the patient's lateral recess and foraminal stenosis, a wide  lateral  decompress was required in excess of what was required to do the  posterior lumbar interbody fusion.  I used a high-speed drill to drill  the patient's laminotomy out more laterally until we encountered some  relatively non-scarred ligamentum flavum.  I then used the Kerrison  punch to perform wide foraminotomies about the bilateral L3, L4, L5, and  S1 nerve roots.  This completed the decompression.   We now turned our attention to the arthrodesis.  We exposed the  bilateral L3-4, L4-5, L5-S1 intervertebral disks, incised these  intervertebral disks with a #15 blade scalpel and performed a bilateral  diskectomy using the pituitary forceps.  We then prepared the vertebral  endplates  for the fusion by using curettes to clear soft tissues.  We  used trial spacers and determined to use a 12 x 26 interbody spacer at  L3-4, a 14 x 26 at L4-5, and a 10 x 26 at S1.  We then prefilled this  prosthesis with a combination of bone morphogenic protein soaked  collagen sponges, Vitoss, and Actifuse as well as local autograft bone  we obtained during the decompression.  We inserted these prostheses into  the interspace of course after retracting neural structures out of  harm's way.  There was a good snug fit of his prosthesis at each level.  I should also mention we filled laterally and medially and eventually to  the prosthesis with local autograft bone, Vitoss, and Actifuse bone  graft extenders.  This completed the posterior lumbar interbody fusion  and placement of the interbody prosthesis at L3-4, L4-5, and L5-S1.   We now turned out attention to the instrumentation under fluoroscopic  guidance.  We cannulated the bilateral L3, 4, 5, and S1 pedicles with  bone probe.  We tapped pedicles with a 6.5-mm tap.  We then probed  inside the tapped pedicles to rule out cortical breaches.  We inserted  7.5 x 15 mm pedicle screws bilaterally under fluoroscopic guidance at  L3, L4, and L5.  We inserted 7.5 x 40 bilaterally at S1.  We got good  bony purchase.  We then palpated along the medial aspect of the pedicles  and noticed no cortical breaches and the nerve roots were not injured.  We then connected the unilateral pedicle screws with a lordotic rod.  We  then secured the rod in place with the caps, which we tightened  appropriately.  This completed the instrumentation from L3-S1  bilaterally.   We now turned our attention to the posterolateral arthrodesis.  I used a  high-speed drill to decorticate the remainder of the L3-4, L4-5 and L5-  S1 facets, pars, transverse process, upper sacrum, etc. and then laid a  combination of local morselized autograft bone, Actifuse, Vitoss, and   bone morphogenic protein over these decorticated posterolateral  structures completing the posterolateral arthrodesis.   We then inspected the thecal sac and bilateral L3, 4, 5, and S1 nerve  roots and noted the neural structures well decompressed.  We obtained  hemostasis using bipolar electrocautery and Gelfoam.   Of note, the patient did ooze quite a bit throughout the surgery.  We  obtained hemostasis with bipolar electrocautery and Gelfoam.  Because  the patient was oozing a bit, we placed a 10-mm flat Jackson-Pratt drain  in the epidural space.  We tunneled out through a separate stab wound.  We then removed the retractors and then reapproximated the patient's  thoracolumbar fascia with interrupted #1 Vicryl suture, subcutaneous  tissue  with interrupted 2-0 Vicryl suture, and the skin with Steri-  Strips and benzoin.  We secured the drain at the exit sites with a 3-0  nylon suture.  A  sterile dressing was applied.  Drapes were removed.  The patient was  subsequently returned to supine position where he was extubated by the  anesthesia team and transported to the Postanesthesia Care Unit in  stable condition.  All sponge, instrument, and needle counts were  correct at the end of this case.      Cristi Loron, M.D.  Electronically Signed     JDJ/MEDQ  D:  11/13/2008  T:  11/14/2008  Job:  [id number]

## 2011-01-19 NOTE — Assessment & Plan Note (Signed)
[name]                            CARDIOLOGY OFFICE NOTE   [name]                        MRN:          [id number]  DATE:12/06/2007                            DOB:          11/13/1965    [name] is a 45 year old gentleman whom I have seen recently for  chest pain, palpitations, shortness breath, history of sarcoid, history  of neurologic sarcoid, hypertension, hyperlipidemia as well as  obstructive sleep apnea.  When I last saw him, we scheduled him to have  an echocardiogram, which was performed on October 31, 2007.  His LV  function was vigorous.  There was focal basal septal hypertrophy, but  there was no evidence of a left ventricular outflow tract obstruction.  There was mild left atrial enlargement.  Note:  He did have a Myoview  performed on October 20, 2007.  The ejection fraction was 68%.  There  was a question of ischemia in the basal lateral wall; however, I did  review this and felt that it was most likely a normal study.  We also  did a CardioNet monitor for palpitations.  He did have occasional PVCs,  but no other sustained arrhythmias were noted.  He continues to have  significant dyspnea on exertion, which has been a chronic problem.  He  also continues to have chest pain; this can occur either with exertion  or at rest.  He also continues to feel his heart racing.Marland Kitchen   MEDICATIONS:  1. Lorazepam 0.5 mg p.o. daily.  2. Vitamin B12.  3. Prednisone 20 mg p.o. daily.  4. Cymbalta.  5. Atenolol 100 mg p.o. b.i.d.  6. Wellbutrin 150 mg p.o. daily.  7. Neurontin 900-mg tablets four times a day.  8. Altace 5 mg daily p.o. daily.  9. Lipitor 40 mg p.o. daily.  10.Flomax.  11.Clonidine 0.3 mg p.o. daily.  12.Prevacid.  13.He also takes Mucinex b.i.d.  14.Restasis.  15.Aspirin.  16.Multivitamin.  17.Azathioprine.   PHYSICAL EXAM:  His physical exam today shows a blood pressure of 130/80  and his pulse of 78.  He  weighs 267 pounds.  HEENT:  Normal.  NECK:  Supple.  CHEST:  Clear.  CARDIOVASCULAR:  Exam reveals a regular rate and rhythm.  ABDOMEN:  Exam shows no tenderness.  EXTREMITIES:  Show no edema.   DIAGNOSES:  1. Continued atypical chest pain -- [name] has had an extensive      evaluation and we have not determined a cardiac etiology.  I will      not pursue this further.  2. Question history of hypertrophic obstructive cardiomyopathy -- his      most recent echocardiogram did not demonstrate this and there is no      evidence of it on physical examination or electrocardiogram.  3. Palpitations -- this appears to related to premature ventricular      contractions and he will continue his atenolol.  4. Sarcoid.  5. History of depression.  6. Sleep apnea.  7. Hyperlipidemia -- this is being managed by his primary care  physician.  8. Hypertension -- his blood pressure is adequately controlled.   We will see him back on an as-needed basis.     Madolyn Frieze Jens Som, MD, [facility]  Electronically Signed    BSC/MedQ  DD: 12/06/2007  DT: 12/06/2007  Job #: [id number]

## 2011-01-19 NOTE — Consult Note (Signed)
NAMEGREGROY, DOMBKOWSKI NO.:  [id number]   MEDICAL RECORD NO.:  [id number]          PATIENT TYPE:  INP   LOCATION:  3738                         FACILITY:  [facility]   PHYSICIAN:  Madolyn Frieze. Jens Som, MD, FACCDATE OF BIRTH:  04-15-1966   DATE OF CONSULTATION:  03/26/2007  DATE OF DISCHARGE:                                 CONSULTATION   HISTORY OF PRESENT ILLNESS:  Mr. Duffy Rhody is a 45 year old male with past  medical history of sarcoid including neurosarcoid, hypertension,  hyperlipidemia, obstructive sleep apnea, gastroesophageal reflux  disease, who I am asked to evaluate for chest pain.  Note the patient  has had intermittent chest pain for years.  He apparently had a cardiac  catheterization in 2003 per a previous consult note.  I do not have  those records available, but apparently he had normal LV function, no  renal artery stenosis and normal coronary arteries.  He also had an  echocardiogram last in February 2005 that showed an ejection fraction of  70% and mild proximal septal thickening.  A Myoview was performed in  January 2007 as well as January 2008.  There was no ischemia noted.  Friday morning, he awoke and noted that he had mild nausea and  diaphoresis.  He also had a pain in the left chest area that was  described as a sharp sensation and radiated to his neck.  There was  shortness of breath and diaphoresis as well as nausea.  Throughout the  day the pain worsened.  It increased with lying flat, deep inspiration  and also with activities.  Because the pain continued, he came the  emergency room was admitted by the primary care service.  We were asked  to evaluate for possibility for cardiac involvement with sarcoid.  Note  his pain has now been present continuously for 2-3 days.   PRESENT MEDICATIONS:  1. Atenolol 1 mg p.o. b.i.d.  2. Cymbalta 90 mg p.o. daily.  3 . Wellbutrin 450 mg daily.  1. Neurontin 900 mg p.o. b.i.d.  2. Lipitor 40 mg p.o.  daily.  3. Guaifenesin 600 mg p.o. b.i.d.  4. Prednisone 20 mg p.o. daily.  5. Ativan 1 mg p.o. q.h.s.  6. Protonix 40 mg daily.  7. Enoxaparin and p.r.n. medications.  8. Altace 5 mg p.o. q. day.   ALLERGIES:  NO KNOWN DRUG ALLERGIES.   SOCIAL HISTORY:  Remote tobacco use but has not smoked in 15 years.  He  does not consume alcohol.   FAMILY HISTORY:  Significant for congestive heart failure in his mother.  She also had renal insufficiency.   PAST MEDICAL HISTORY:  1. There is no diabetes mellitus.  2. There is hypertension and hyperlipidemia.  3. He has a history of obstructive sleep apnea.  4. He has sarcoidosis including neurosarcoid.  5. He has a history of gastroesophageal reflux disease.  6. Also has arthritis.  7. He has a history of chronic pain as well as spinal stenosis.  8. He has had previous back surgery.  9. He has also had previous kidney surgery.  10.He  has had a previous tonsillectomy as well.   REVIEW OF SYSTEMS:  He has not had headaches.  He had diaphoresis but he  denies any fevers.  He has not had for productive cough.  There has been  occasional hemoptysis in the past by his report.  There is no dysphagia,  odynophagia, melena or hematochezia present.  There is no dysuria after  fevers or seizure activity.  There is no orthopnea or PND but he does  have pedal edema occasionally.  He also has fatigue.  Remaining systems  are negative.   PHYSICAL EXAMINATION:  VITAL SIGNS:  Blood pressure 114/73 and pulse is  60, he was afebrile.  GENERAL:  He is well-developed and somewhat obese.  No acute distress.  Skin is warm and dry.  Not depressed.  There is no peripheral clubbing.  His back is status post surgery.  HEENT:  Normal with normal eyelids.  NECK:  Supple with a normal upstroke bilaterally.  I cannot appreciate  bruits.  There is no jugular distension.  No thyromegaly noted.  CHEST:  Clear system expansion.  CARDIOVASCULAR:  Regular rhythm.  Normal  S1-S2.  No murmurs, rubs or  gallops noted.  I cannot palpate a PMI.  ABDOMEN:  Nontender, nondistended.  Positive bowel sounds.  No  hepatosplenomegaly.  No mass appreciated.  There is no abdominal bruit.  He has 2+ femoral pulses bilaterally.  No bruits.  EXTREMITIES:  Show no edema.  I can palpate no cords.  NEUROLOGIC:  Exam is grossly intact.   LABORATORY DATA:  Laboratories show a sodium of 141, potassium 4.2.  BUN  and creatinine are 7 and 1.18, respectively.  His CBC shows hemoglobin  of 14, hematocrit 41.6.  TSH is normal.  Cardiac markers are normal.  There is mention of a troponin of 0.07 with a follow-up of 0.06, which I  feel is normal.  He did have a chest CT on July 19 that showed no  pulmonary embolus.  There was evidence of adenopathy related to his  sarcoid.  Electrocardiogram shows normal sinus rhythm with no ST  changes.   DIAGNOSES:  1. Atypical chest pain - Mr. Bua symptoms are extremely unusual      for cardiac ischemia.  I doubt cardiac etiology.  Note his      electrocardiogram is normal, he has negative enzymes despite 3 days      of continuous chest pain.  He has also had previous cardiac      catheterization in 2003 as well as recent Myoview.  I do not think      we need to pursue further ischemia evaluation.  We will check an      echocardiogram to quantify his LV function and to rule out sarcoid      involvement of his heart.  We will also schedule him a thallium as      an outpatient to further evaluate for sarcoid involvement.  If      these are negative, I do not think we need to pursue further      cardiac evaluation.  2. Hypertension - blood pressure is well-controlled his present      medications.  3. Sarcoid - per his primary care team.  4. Hyperlipidemia - per his primary care team.      Madolyn Frieze. Jens Som, MD, [facility]  Electronically Signed     BSC/MEDQ  D:  03/26/2007  T:  03/27/2007  Job:  [id number]

## 2011-01-19 NOTE — H&P (Signed)
[name], [name]               ACCOUNT NO.:  [id number]   MEDICAL RECORD NO.:  [id number]          PATIENT TYPE:  INP   LOCATION:  3738                         FACILITY:  [facility]   PHYSICIAN:  Melissa L. Ladona Ridgel, MD  DATE OF BIRTH:  02/03/1966   DATE OF ADMISSION:  03/24/2007  DATE OF DISCHARGE:                              HISTORY & PHYSICAL   CHIEF COMPLAINT:  Chest pain.   PRIMARY CARE PHYSICIAN:  Dr. Amador Cunas.  The patient has seen Dr.  Algie Coffer in the past and has seen the [place] [facility] in the past for  cardiac issues, and is requesting to see [place] for his cardiology  issues.  The patient also sees Dr. Trudie Buckler in [place] for his  sarcoidosis.   HISTORY OF PRESENT ILLNESS:  The patient is a 45 year old African-  American male who felt ill like he had a viral illness coming on.  He  complained of weakness and mild chest pain which was worse with  ambulation and with activity.  He had no fever but did have nausea and  sweating associated with the pain.  The pain has worsened over time and  with walking and denies any change in the pain with change in position.  He has been more short of breath and recently may be more exacerbated  with his breathing.  The pain was relieved only by morphine in the  emergency room.  The pain is located in a central area and is not  radiating.   REVIEW OF SYSTEMS:  He had some weight gain recently with decreased  appetite.  He states that his feet have been swelling.  He complains of  some right knee pain and some left side pain that comes and goes.  Otherwise, per HPI and all else is negative.   PAST MEDICAL HISTORY:  Sarcoidosis, listed in some of his notes as  neurologic but it appears to involve his lungs and eyes.  Sleep apnea,  arthritis, hypertension.  He denies diabetes.  He does have  dyslipidemia, spinal stenosis with chronic pain, GERD, and B12  deficiency.   PAST SURGICAL HISTORY:  Lumbar decompression, biopsy of the  leg muscles,  and neck surgery.   SOCIAL HISTORY:  He denies tobacco, ethanol.  He was a Technical sales engineer.  He is married with three children.   FAMILY HISTORY:  Mom had kidney failure and heart disease as well as  hypertension.  Dad had hypertension and prostate cancer.  Both are  living to the best of my knowledge.   VITAL SIGNS:  Temperature is 98.5, blood pressure 138/75, pulse 106,  respirations 20, saturation 100%.  GENERAL:  Mild distress secondary to chest discomfort.  Pupils are  equal, round, reactive to light.  Extraocular muscles are intact.  Mucous membranes are dry.  He does have a coated tongue.  CHEST:  Decreased with no rhonchi, rales or wheezes.  CARDIOVASCULAR:  Regular rate and rhythm; positive S1, S2; no S3, S4.  He does have a high-pitched 2/6 systolic ejection murmur best heard at  the left sternal border.  ABDOMEN:  Obese, nontender, nondistended, with left upper quadrant  tenderness, bowel sounds are present.  EXTREMITIES:  Showed trace to 1+ edema with 2+ pulses.  NEUROLOGIC:  He is awake, alert, and oriented.  Cranial nerves II-XII  are intact.  Power is 5/5.  DTRs are 2/6.   Recent Myoview in January revealed normal function with an EF of 61%.  CT of the chest shows no acute PE or dissection or aneurysm.  He has  stable lymphadenopathy, mild left perinephric stranding is unchanged  since his CT scan in June of this year.  Sodium is 139, potassium 4.6,  chloride 107, CO2 24, BUN is 9, creatinine 0.9, glucose is 124.  His  cardiac markers are negative.   ALLERGIES:  No known drug allergies.   The patient takes:  1. Nitroglycerin as needed.  2. Atenolol 100 mg twice daily.  3. Cymbalta 90 mg daily.  4. Wellbutrin 450 mg daily.  5. Neurontin 900 mg q.i.d.  6. Lipitor 40 mg daily.  7. Altace is taken but the dose is unknown.  8. Mucinex 600 mg b.i.d.  9. Prednisone 20 mg daily.  10.Ativan 1 mg in the p.m.   ASSESSMENT AND PLAN:  The  patient is a 45 year old African-American male  with multiple medical problems, the most significant of which is neuro  sarcoid, history of chest pain which is worsening, hypertension,  hyperlipidemia.  The patient presents with several days of nausea,  sweating and chest pain.  The symptoms are relieved by morphine and  exacerbated by walking.  CT of the chest is negative for PE and there is  no mention of pericardial fluid or effusion suggesting pericarditis.  The patient initially came in with sinus tachycardia but with pain  control and IV fluids he is now in normal sinus rhythm in the 80s.   1. Cardiovascular symptoms worrisome for cardiac source of pain.  Will      continue the morphine.  Will start him on anticoagulation, Lovenox      for ACS protocol.  Will continue his atenolol, Lipitor, and will      have to check on the Altace dose.  I would recommend a cardiology      consult in the morning.  The patient requests [place].  2. Depression and chronic pain.  Will continue his Cymbalta,      Wellbutrin, Neurontin and Ativan.  3. Sarcoidosis.  Will continue his prednisone and Mucinex for comfort.  4. GI:  Left upper quadrant pain with an area of perinephric stranding      on the left side of unclear significance and etiology.  The      question is, is there abscess or some worrisome issue there.  For      now, will check a urinalysis, culture and sensitivity to rule out      UTI, and will consider further imaging with the primary service.  5. GU:  No current issues but will check a UA to rule out infection      with the continuous edema being present.  6. Endocrine.  He is hypothyroid.  Will check a TSH.  7. Sleep apnea.  Will use his CPAP per protocol.   Please note that I have attempted to call cardiology x2 this morning.  I  suspect that they are currently in the catheterization laboratory with a  STEMI.  I will continue to try to obtain a cardiology consult for this   patient.  Melissa L. Ladona Ridgel, MD  Electronically Signed    MLT/MEDQ  D:  03/27/2007  T:  03/27/2007  Job:  [id number]   cc:   Gordy Savers, MD

## 2011-01-19 NOTE — Discharge Summary (Signed)
[name], Ferguson NO.:  [id number]   MEDICAL RECORD NO.:  [id number]          PATIENT TYPE:  INP   LOCATION:  3738                         FACILITY:  [facility]   PHYSICIAN:  Valerie A. Felicity Coyer, MDDATE OF BIRTH:  12/11/1965   DATE OF ADMISSION:  03/24/2007  DATE OF DISCHARGE:  03/28/2007                               DISCHARGE SUMMARY   DISCHARGE DIAGNOSES:  1. Atypical chest pain status post cardiac MRI noting left ventricular      ejection fraction of 53% and no evidence of sarcoid in the      myocardium.  2. History of sarcoidosis.  3. Depression/anxiety.  4. Chronic pain.  5. Obstructive sleep apnea on bedtime constant positive airway      pressure.  6. Arthritis.  7. Dyslipidemia.  8. Gastroesophageal reflux disease.  9. History of spinal stenosis with chronic pain.  10.History of B12 deficiency.   HISTORY OF PRESENT ILLNESS:  [name] is a 45 year old African  American male admitted on March 25, 2007, with chief complaint of chest  pain.  The patient noted mild chest pain which was worse with ambulation  and activity.  He denied any fever.  He did note some nausea and  sweating.  He was admitted for further evaluation and treatment.   PAST MEDICAL HISTORY:  1. Sarcoidosis.  2. Obstructive sleep apnea.  3. Arthritis.  4. Hypertension.  5. Spinal stenosis with chronic pain.  6. GERD.  7. B12 deficiency.  8. Dyslipidemia.   COURSE OF HOSPITALIZATION:  ATYPICAL CHEST PAIN.  The patient was  admitted and was placed on telemetry.  A cardiology consult was obtained  and the patient was seen by Dr. Jens Som.  Serial cardiac enzymes were  negative.  The patient underwent a cardiac MRI which showed a left  ventricular ejection fraction of 53% and no evidence of sarcoid in the  myocardium.  The patient's symptoms resolved.  Per Dr. Ludwig Clarks  recommendations, if the MRI was negative for sarcoid involvement no  further cardiac workup will be  planned.   MEDICATIONS AT TIME OF DISCHARGE:  1. Atenolol 100 mg p.o. b.i.d.  2. Duloxetine 90 mg p.o. daily.  3. Wellbutrin 450 mg p.o. daily.  4. Neurontin 900 mg p.o. q.i.d.  5. Lipitor 40 mg p.o. daily.  6. Guaifenesin 600 mg p.o. b.i.d.  7. Prednisone 20 mg p.o. daily.  8. Ativan 1 mg p.o. t.i.d. as needed.  9. Altace 5 mg p.o. daily.  10.Mucinex 600 mg p.o. b.i.d.  11.Cymbalta 90 mg p.o. daily.  12.Restasis eye drops twice daily as before.  13.Clonidine 0.1 mg at night.  14.Flomax 0.4 mg p.o. daily.  15.Azathioprine 100 mg p.o. b.i.d.   PERTINENT LABORATORIES AT TIME OF DISCHARGE:  BUN 7, creatinine 1.18.  hemoglobin 13.6, hematocrit 40.6.   DISPOSITION:  The patient will be discharged to home.   FOLLOWUP:  The patient is instructed to follow up with Dr. Eleonore Chiquito in 1-2 weeks and contact the office for an appointment.      Sandford Craze, NP      Raenette Rover.  Felicity Coyer, MD  Electronically Signed    MO/MEDQ  D:  03/28/2007  T:  03/28/2007  Job:  [id number]   cc:   Gordy Savers, MD

## 2011-01-19 NOTE — Assessment & Plan Note (Signed)
[facility]                            CARDIOLOGY OFFICE NOTE   [name], [name]                        MRN:          [id number]  DATE:10/30/2007                            DOB:          Apr 03, 1966    HISTORY OF PRESENT ILLNESS:  [name] is a 45 year old male with a  past medical history of sarcoid, including neurologic-sarcoid,  hypertension, hyperlipidemia, obstructive sleep apnea, gastroesophageal  reflux disease, and recurrent chest pain, who returns in followup. He  apparently had a cardiac catheterization in 2003, based on a previous  note. At that time, he had normal LV function and normal coronary  arteries but those records are not available. He has had a diagnosis of  hypertrophic obstructive cardiomyopathy in the past. I do have his most  recent echocardiogram dated October 10, 2003. At that time, he had  normal LV function. There was upper septal thickening per Dr. Myrtis Ser to  report but there was no SAM. There was no LVO2 gradient. The patient  also had a cardiac MRI in July of 2008. There was mild basal septal  hypertrophy and the ejection fraction was 52%. There were no regional  wall motion abnormalities. There was no evidence of scar tissue in the  heart when assessing for infiltration, sarcoid lesions, or scarring. The  patient has also had multiple Myoview's. He had 1 in January of 2008  that showed an ejection fraction of 61% and normal perfusion. There is  also one in January of 2007 that showed no inducible ischemia and an  ejection fraction of 65%. I also have a report from 2003 that showed no  significant abnormalities. Finally, he has also had CT angiograms. He  was recently admitted to [facility] with recurrent chest pain.  A CT angiogram of the chest on October 13, 2007 showed no embolus. There  was stable bilateral hilar adenopathy and stable small pulmonary  nodules. Note, he did rule out for myocardial  infarction with serial  enzymes. He had a followup Myoview on October 20, 2007. It was  interpreted by Dr. Diona Browner. Described as diaphragmatic attenuation. It  also describes mild decreased activity in the basal lateral wall with  partial reversibility on the comparison to the stress images. This was  felt to possibly be related to diaphragmatic attenuation. I did review  this and felt that the study was most likely normal. Note, the patient  continues to have chest pain. This appears to be a long, chronic problem  for him. The pain is in the substernal area. It can either occur at rest  or with activities. It can last up to 2 hours at a time. The pain can  occur with exertion and at rest. It can increase with deep inspiration.  It does not change with position. It is not related to food. He also has  dyspnea on exertion. He also is complaining of intermittent palpitations  that are described as his heart racing.   MEDICATIONS:  Include Lorazepam, vitamin B12, prednisone, Prevacid,  Cymbalta, Atenolol 100 mg p.o. b.i.d., Wellbutrin, Neurontin, Altace,  Lipitor 40 mg p.o. daily, Flomax and Clonidine. He is also taking  Mucinex, Restasis and aspirin,   PHYSICAL EXAMINATION:  VITAL SIGNS:  Blood pressure 120/90, pulse 65,  weight 267 pounds.  HEENT:  Unremarkable.  NECK:  Supple.  CHEST:  Clear.  CARDIOVASCULAR:  Regular rate and rhythm.  ABDOMEN:  No tenderness.  EXTREMITIES:  No edema.   LABORATORY DATA:  I do have an electrocardiogram  from when he had his  Myoview on October 20, 2007. There was a normal sinus rhythm with no ST  changes.   DIAGNOSIS:  1. Continuing atypical chest pain. I have reviewed his Myoview and      feel that it is most likely normal. We will not proceed with      further ischemia evaluation at this point.  2. Question history of hypertrophy obstructive cardiomyopathy. We will      plan to repeat his echocardiogram, although he certainly does not       have an electrocardiogram or physical examination consistent with      hypertrophy obstructive cardiomyopathy.  3. Palpitations. We scheduled him to have a monitor to more fully      evaluate. He will continue on his Atenolol.  4. Sarcoid. This will be managed with his primary care physician.  5. History of depression.  6. Sleep apnea.  7. Hyperlipidemia. He will continue on his Statin and this will be      managed by his primary care physician.  8. Hypertension. We will follow this and adjust his regimen as      indicated.   FOLLOWUP:  We will see him back to review his monitor in 6 weeks.     Madolyn Frieze Jens Som, MD, [facility]  Electronically Signed    BSC/MedQ  DD: 10/30/2007  DT: 10/31/2007  Job #: [id number]

## 2011-01-19 NOTE — Discharge Summary (Signed)
[name], Ferguson NO.:  [id number]   MEDICAL RECORD NO.:  [id number]          PATIENT TYPE:  INP   LOCATION:  3735                         FACILITY:  [facility]   PHYSICIAN:  Valerie A. Felicity Ferguson, MDDATE OF BIRTH:  03/02/1966   DATE OF ADMISSION:  05/26/2008  DATE OF DISCHARGE:  05/28/2008                               DISCHARGE SUMMARY   DISCHARGE DIAGNOSES:  1. Recurrent atypical chest pain.  2. Depression/anxiety.  3. Hyperlipidemia.  4. Hypertension.  5. Obstructive sleep apnea, uses home CPAP.  6. History of spinal stenosis/chronic back pain.  7. History of B12 deficiency.  8. Gastroesophageal reflux disease.  9. History of palpitations.   HISTORY OF PRESENT ILLNESS:  [name] is a 45 year old male who was  admitted on May 26, 2008, with chief complaint of chest pain.  He  noticed chest pain may locate in the left anterior precordial area which  started earlier in the day in the daytime, gradually getting worse.  He  had a similar admission earlier this year, which was followed by an  outpatient cardiac consult.  He is admitted for further evaluation and  treatment.   PAST MEDICAL HISTORY:  1. Recurrent chest pain.  2. Question, history of hypertrophic obstructive cardiomyopathy.  3. Depression.  4. Sleep apnea.  5. Hyperlipidemia.  6. Palpitations, deemed to be PVCs  in the past.  7. Chronic back pain.  8. Spinal stenosis.  9. History B12 deficiency.  10.GERD.   COURSE OF HOSPITALIZATION:  Atypical chest pain.  The patient was  admitted.  He underwent serial cardiac enzymes which were negative.  BNP  was less than 30.  D-dimer was negative.  His Myoview which was  performed in March 2009 was negative for Dr. Ludwig Ferguson note and it was  felt that no further cardiac workup was deemed necessary.  His symptoms  were most likely due to sarcoidosis.  He did have some complaints of  dizziness and was noted mildly hypotensive and his blood  pressure  medications were decreased.  His dizziness has resolved.  His chest pain  is improved.  However, he is instructed to follow up with his  pulmonologist at [facility] for further discussion as it very well may be  that his sarcoid is contributing to his pain, but also consider  outpatient workup of cervical pain as his pain does seem to originate in  the neck region per the patient's primary MD.   MEDICATIONS AT TIME OF DISCHARGE:  1. Ultram 1 tablet p.o. 4 times daily.  2. Aspirin 81 mg p.o. daily.  3. Azathioprine 100 mg p.o. b.i.d.  4. Multivitamin 1 tablet p.o. daily.  5. Prevacid 30 mg p.o. daily.  6. Clonidine 0.1 mg p.o. b.i.d.  7. Flomax 0.4 mg p.o. daily.  8. Lipitor 40 mg p.o. daily.  9. Atenolol 100 mg tabs one-half tablet p.o. b.i.d., this is decreased      dose.  10.Neurontin 900 mg p.o. q.i.d.  11.Wellbutrin 450 mg p.o. daily.   PERTINENT LABORATORIES:  At the time of discharge; hemoglobin 13.6,  hematocrit 40.5, BUN 10,  creatinine 1.04, and INR 1.0.   DISPOSITION:  The patient will be discharged to home.   FOLLOW UP:  He was instructed to follow up with Dr. Eleonore Ferguson in  2 weeks and contact the office for an appointment.   Greater than 30 minutes spent on discharge planning.      John Craze, NP      John Rover. Felicity Coyer, MD  Electronically Signed    MO/MEDQ  D:  05/28/2008  T:  05/29/2008  Job:  [id number]   cc:   John Savers, MD

## 2011-01-21 ENCOUNTER — Encounter: Payer: Self-pay | Admitting: Internal Medicine

## 2011-01-21 ENCOUNTER — Ambulatory Visit (INDEPENDENT_AMBULATORY_CARE_PROVIDER_SITE_OTHER): Payer: Medicare Other | Admitting: Internal Medicine

## 2011-01-21 DIAGNOSIS — F3289 Other specified depressive episodes: Secondary | ICD-10-CM

## 2011-01-21 DIAGNOSIS — E7800 Pure hypercholesterolemia, unspecified: Secondary | ICD-10-CM

## 2011-01-21 DIAGNOSIS — I428 Other cardiomyopathies: Secondary | ICD-10-CM

## 2011-01-21 DIAGNOSIS — F329 Major depressive disorder, single episode, unspecified: Secondary | ICD-10-CM

## 2011-01-21 DIAGNOSIS — E739 Lactose intolerance, unspecified: Secondary | ICD-10-CM

## 2011-01-21 DIAGNOSIS — I10 Essential (primary) hypertension: Secondary | ICD-10-CM

## 2011-01-21 NOTE — Patient Instructions (Signed)
It is important that you exercise regularly, at least 20 minutes 3 to 4 times per week.  If you develop chest pain or shortness of breath seek  medical attention.  You need to lose weight.  Consider a lower calorie diet and regular exercise.  Please check your blood pressure on a regular basis.  If it is consistently greater than 150/90, please make an office appointment.

## 2011-01-21 NOTE — Progress Notes (Signed)
  Subjective:    Patient ID: [name], male    DOB: 08/04/1966, 45 y.o.   MRN: [id number]  HPI 45 year old patient who is seen today for followup. He has a history of hypertension and hypertrophic cardiomyopathy. He ran out of atenolol earlier and had the increase in blood pressure and worsening palpitations. He describes some significant dizziness when he gets out of bed during the night to angulated to the bathroom. He does take Catapres at bedtime. Symptoms do sound orthostatic. He has a history of dyslipidemia and also depression he has done the much better as far as his depression is losing weight and is considering returning to full-time employment. Presently he is working part-time and is concerned about losing benefits if he were to work full-time clinically he seems to doing quite well.    Review of Systems  Constitutional: Negative for fever, chills, appetite change and fatigue.  HENT: Negative for hearing loss, ear pain, congestion, sore throat, trouble swallowing, neck stiffness, dental problem, voice change and tinnitus.   Eyes: Negative for pain, discharge and visual disturbance.  Respiratory: Negative for cough, chest tightness, wheezing and stridor.   Cardiovascular: Negative for chest pain, palpitations and leg swelling.  Gastrointestinal: Negative for nausea, vomiting, abdominal pain, diarrhea, constipation, blood in stool and abdominal distention.  Genitourinary: Negative for urgency, hematuria, flank pain, discharge, difficulty urinating and genital sores.  Musculoskeletal: Negative for myalgias, back pain, joint swelling, arthralgias and gait problem.  Skin: Negative for rash.  Neurological: Positive for light-headedness. Negative for dizziness, syncope, speech difficulty, weakness, numbness and headaches.  Hematological: Negative for adenopathy. Does not bruise/bleed easily.  Psychiatric/Behavioral: Negative for behavioral problems and dysphoric mood. The patient is not  nervous/anxious.        Objective:   Physical Exam  Constitutional: He is oriented to person, place, and time. He appears well-developed.       Blood pressure 110/70  HENT:  Head: Normocephalic.  Right Ear: External ear normal.  Left Ear: External ear normal.  Eyes: Conjunctivae and EOM are normal.  Neck: Normal range of motion.  Cardiovascular: Normal rate, regular rhythm and normal heart sounds.   Pulmonary/Chest: Breath sounds normal.  Abdominal: Soft. Bowel sounds are normal. He exhibits no distension. There is no tenderness.  Musculoskeletal: Normal range of motion. He exhibits no edema and no tenderness.  Neurological: He is alert and oriented to person, place, and time.  Psychiatric: He has a normal mood and affect. His behavior is normal.          Assessment & Plan:   Hypertension stable. Sounds like he is developing nighttime orthostatic symptoms. We'll discontinue Catapres  depression stable will decrease Wellbutrin from 450-300 mg daily Hypertrophic cardiomyopathy Dyslipidemia  We'll schedule a complete physical in 4 months

## 2011-01-22 NOTE — Discharge Summary (Signed)
[name], EKHOLM NO.:  [id number]   MEDICAL RECORD NO.:  [id number]          PATIENT TYPE:  INP   LOCATION:  3014                         FACILITY:  [facility]   PHYSICIAN:  Cristi Loron, M.D.DATE OF BIRTH:  06/13/1966   DATE OF ADMISSION:  12/15/2006  DATE OF DISCHARGE:  12/19/2006                               DISCHARGE SUMMARY   BRIEF HISTORY:  The patient is a 45 year old black male who has suffered  from back and bilateral leg pain.  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  The patient's  symptoms are more consistent with neurogenic claudication.  He failed  medical management.  I discussed the various treatment options with the  patient including a decompressive laminectomy.  The patient has weighed  the risks, benefits and alternatives of surgery, and decided to proceed  with a decompressive laminectomy at L3, L4 and L5.   For further details of this admission, please refer to our typed history  and physical.   HOSPITAL COURSE:  I admitted the patient to [facility]  on 12/15/2006 with a diagnosis of lumbar spinal stenosis.  On the day of  admission, I performed an L3, 4 and 5 laminectomy.  The surgery went  well (for full details of this operation, please refer to the typed  operative note).   POSTOPERATIVE COURSE:  The patient's postoperative course was  unremarkable and he was discharged home on postop day #4, i.e., on December 19, 2006.   DISCHARGE INSTRUCTIONS:  The patient was given written discharge  instructions and instructed to follow up with me in four weeks.   DISCHARGE PRESCRIPTIONS:  Percocet 10/325, #100, one p.o. every 4 hours  as needed for pain; Valium 5 mg, #50, one p.o. every 6 hours as needed  for muscle spasm.   DISCHARGE DIAGNOSIS:  L3-L4, L4-L5 and L5-S1 spinal stenosis, epidural  lipomatosis, lumbago, lumbar radiculopathy.   PROCEDURES PERFORMED:  L3, L4 and L5 laminectomy and bilateral  foraminotomies to treat the spinal stenosis at L3-L4, L4-L5 and L5-S1,  using microdissection.      Cristi Loron, M.D.  Electronically Signed     JDJ/MEDQ  D:  01/19/2007  T:  01/19/2007  Job:  [id number]

## 2011-01-22 NOTE — Discharge Summary (Signed)
NAMEHOLLIS, [name] NO.:  [id number]   MEDICAL RECORD NO.:  [id number]          PATIENT TYPE:  INP   LOCATION:  4740                         FACILITY:  [facility]   PHYSICIAN:  Ricki Rodriguez, M.D.  DATE OF BIRTH:  14-Aug-1966   DATE OF ADMISSION:  09/24/2005  DATE OF DISCHARGE:  09/27/2005                                 DISCHARGE SUMMARY   PRINCIPAL DIAGNOSES:  1.  Chest pain.  2.  Hypertension.  3.  Sarcoidosis.  4.  Anxiety and depression.   DISCHARGE MEDICATIONS:  1.  Cymbalta 50 mg one daily.  2.  Atenolol 100 mg twice daily.  3.  Clonidine 0.1 mg twice daily.  4.  Prevacid 30 mg one twice daily.  5.  Prednisone 20 mg one daily.  6.  Neurontin 300 mg three times daily.  7.  Percocet 5/325 mg two tablets three times daily as stated.  8.  Lorazepam 1 mg at bedtime.  9.  Altace 2.5 mg one daily.  10. Lipitor 40 mg one daily.   DIET:  Low fat, low salt diet.   ACTIVITY:  The patient to increase activity slowly.   FOLLOW UP:  By Dr. Ricki Rodriguez in two weeks.  The patient to call [phone] for appointment.   CONDITION ON DISCHARGE:  Improved.   HISTORY:  This 45 year old black male complained of sharp pain occasionally  as a heaviness radiating to the left arm along with some sweating.  The  patient was recently diagnosed with sarcoidosis and was told he may have a  heart involvement.   PHYSICAL EXAMINATION:  VITAL SIGNS:  Pulse 80, respirations 20, blood  pressure 135/76, height 5 feet 9 inches, weight 260 pounds.  GENERAL:  The patient is alert and oriented x3.  HEENT:  The patient is normocephalic, atraumatic.  Eyes:  Manson Passey.  Pupils are  equal and reacting to light.  Extraocular movements intact.  Ears, nose and  throat:  Grossly intact.  NECK:  No JVD. No carotid bruits.  LUNGS:  Clear bilaterally.  HEART:  Normal. S1 and S2.  ABDOMEN:  Soft, nontender.  EXTREMITIES:  No edema, cyanosis or clubbing.  CNS:  Cranial nerves grossly intact.   Bilateral equal grip.  The patient is  right-handed.   LABORATORY DATA:  Normal electrolytes, BUN, creatinine and glucose.  Normal  CK-MB.  Slightly elevated troponin of 0.06.  Cholesterol 183, triglycerides  125, HDL cholesterol 62, LDL cholesterol 96.  Normal WBC, hemoglobin,  hematocrit, and platelet count.  Chest x-ray:  No acute cardiac or pulmonary disease with prominent  peribronchial soft tissue.  EKG shows normal sinus rhythm.   HOSPITAL COURSE:  The patient was admitted to telemetry unit.  Myocardial  infarction was mostly ruled out with slightly borderline troponin-I level.  He underwent nuclear stress test that failed to show any reversible  ischemia.  The patient also had normal cardiac catherization few years ago.  Hence, he was discharged home in satisfactory condition with followup by me  in two weeks.      Ricki Rodriguez,  M.D.  Electronically Signed     ASK/MEDQ  D:  09/27/2005  T:  09/27/2005  Job:  [id number]

## 2011-01-22 NOTE — Discharge Summary (Signed)
NAMETATE, ZAGAL NO.:  [id number]   MEDICAL RECORD NO.:  [id number]                   PATIENT TYPE:  INP   LOCATION:  2018                                 FACILITY:  [facility]   PHYSICIAN:  Gordy Savers, M.D. [facility]      DATE OF BIRTH:  10/30/1965   DATE OF ADMISSION:  04/16/2002  DATE OF DISCHARGE:  04/19/2002                                 DISCHARGE SUMMARY   FINAL DIAGNOSES:  Palpitations.   ADDITIONAL DIAGNOSES:  1. Hypertension.  2. Hypertrophic cardiomyopathy.  3. Possible anxiety disorder.   PROCEDURE:  A 2-D echocardiogram.   HISTORY OF PRESENT ILLNESS:  The patient is a 45 year old, black gentleman  who presented with a chief complaint of left neck, shoulder, and chest  discomfort with fluttering intermittently for 3 weeks.  At times he felt  dizzy and had some dyspnea on exertion.  Two days prior to admission, he  noted left shoulder pain with radiation to the neck associated with the  heart fluttering.   MEDICATIONS PRIOR TO ADMISSION:  Included Atenolol 100 mg daily and Cartia  unclear dose daily.   LABORATORY DATA AND HOSPITAL COURSE:  The patient was admitted to the  telemetry area where he was monitored.  Electrocardiogram on admission  revealed normal sinus rhythm and was normal.  During the hospital period, he  was seen in consultation by cardiology.  He was well known to the service,  having had a heart catheterization approximately two years ago.  During the  hospital period, a 2-D echocardiogram, as well as Adenosine stress test were  obtained.  A 2-D echocardiogram revealed normal LV size with ejection  fraction of 70%.  There is normal left systolic function.  There is a  suggestion of a possible bicuspid aortic valve, but there is no definite  systolic anterior motion of the mitral valve.  During the hospital period,  he also had cerebral vascular evaluation that included carotid artery  Doppler evaluation.   This revealed antegraded arterial flow and no  significant plaque noted.  The Cardiolite stress test on 04/17/2002 was  negative for ischemia.  During the hospital period, he was noted to have  some sinus bradycardia and his Atenolol and Diltiazem were discontinued.  Labetalol, Diovan, and Norvasc were substituted with good blood pressure  control.   LABORATORY STUDIES:  Unremarkable.  This included a normal TSH and normal  cardiac enzymes.   DISPOSITION:  The patient will be discharged today on the following regimen:  Norvasc 5 mg daily, Diovan 80 mg daily, Labetalol 100 mg b.i.d., Prozac 20  mg daily, Aspirin 325 daily, Nexium 40 mg daily.  The patient has been asked  to return to the office in three weeks for follow up.  Gordy Savers, M.D. [facility]   PFK/MEDQ  D:  04/19/2002  T:  04/23/2002  Job:  [id number]

## 2011-01-22 NOTE — Discharge Summary (Signed)
NAMEDIOR, John NO.:  [id number]   MEDICAL RECORD NO.:  [id number]          PATIENT TYPE:  INP   LOCATION:  3031                         FACILITY:  [facility]   PHYSICIAN:  [name], M.D.DATE OF BIRTH:  09/15/1965   DATE OF ADMISSION:  11/13/2008  DATE OF DISCHARGE:  11/18/2008                               DISCHARGE SUMMARY   BRIEF HISTORY:  The patient is a 45 year old black male who suffered  from chronic back and leg pain.  He has previously been diagnosed with  spinal stenosis from predominant use of spinal epidural lipomatosis.  I  performed a decompressive laminectomy on him a few years ago, and he  initially improved.  More recently the patient had chronic debilitating  back pain.  He has failed successive nonsurgical management and was  worked up with a lumbar MRI with lumbar diskogram, etc., which  demonstrated the patient had disk degeneration and reported pain at L3-  4, L4-5, L5-S1.  I have discussed the various treatment options with the  patient including surgery.  The patient has weighed the risks, benefits,  and alternatives of surgery, and decided to proceed with L3-4, L4-5, L5-  S1 decompression, instrumentation, and fusion.   For further details of this operation please refer to typed history and  physical.   HOSPITAL COURSE:  I admitted the patient to [facility] on November 13, 2008.  On day of admission, I performed L3-4, L4-5, L5-S1  decompression, instrumentation, and fusion.  The surgery went well (for  full details of this operation please refer to operative note).   POSTOPERATIVE COURSE:  The patient's postoperative course was  unremarkable.  He was discharged to home on November 18, 2008.   DISCHARGE INSTRUCTIONS:  The patient was given written discharge  instructions to follow up with me in 4 weeks.   DISCHARGE PRESCRIPTIONS:  Percocet 10/325 #100 one p.o. q.4 h. p.r.n.  for pain, Valium 5 mg #50 one p.o. q.6 hours  p.r.n. for muscle spasm.   FINAL DIAGNOSES:  L3-4, L4-5, L5-S1 disk degeneration, lumbago, spinal  stenosis, lumbar radiculopathy.   PROCEDURES PERFORMED:  1. Redo L3, L4, and L5 laminectomy.  2. Decompression of bilateral L3, L4, L5, and S1 nerve roots; L3-4, L4-      5, L5-S1 posterior lumbar interbody fusion with local morselized      autograft bone and Actifuse/Vitoss bone graft extender with bone      morphogenic protein; insertion of L3-4, L4-5, L5-S1 interbody      prosthesis (Capstone PEEK interbody prosthesis); posterior      segmental instrumentation at [facility] titanium pedicle      screws and rods; L3-4, L4-5, L5-S1 posterolateral arthrodesis with      local morselized autograft bone and bone morphogenic protein and      Actifuse/Vitoss bone graft extenders.      [name], M.D.  Electronically Signed     JDJ/MEDQ  D:  12/12/2008  T:  12/13/2008  Job:  [id number]

## 2011-01-22 NOTE — Letter (Signed)
October 28, 2006     RE:  AIRRION, OTTING  MRN:  [id number]  /  DOB:  09/13/1965   To Whom It May Concern:   Mr. Reg Bircher is a patient followed in our internal medicine  practice. This 45 year old African-American male has multiple medical  problems and is followed by multiple consultants.   Medical problems include neurosarcoid, hypertension,  hypercholesterolemia. He is on chronic immunosuppressant treatment and  also requires medications for a number of other medical problems  including hypertension, dyslipidemia, and chronic pain. He has a B12  deficiency, peripheral neuropathy, sleep apnea, and gastroesophageal  reflux disease.   Due to his multiple medical problems, he required daily prescriptions in  excess of 11. These have been evaluated critically and are all medically  appropriate.    Sincerely,      Gordy Savers, MD  Electronically Signed    PFK/MedQ  DD: 10/28/2006  DT: 10/28/2006  Job #: [id number]

## 2011-01-22 NOTE — Op Note (Signed)
[name], SINDELAR NO.:  [id number]   MEDICAL RECORD NO.:  [id number]          PATIENT TYPE:  INP   LOCATION:  3014                         FACILITY:  [facility]   PHYSICIAN:  Cristi Loron, M.D.DATE OF BIRTH:  10/01/1965   DATE OF PROCEDURE:  12/15/2006  DATE OF DISCHARGE:                               OPERATIVE REPORT   BRIEF HISTORY:  The patient is a 45 year old black male who had suffered  from back and bilateral leg pain.  He failed medical management, was  worked up with a lumbar MRI which demonstrated the patient had severe  epidural lipomatosis and L3-4, L4-5 and L5-S1.  The patient's symptoms  were consistent with neurogenic claudication.  He failed medical  management.  I discussed the various treatment options with the patient  including a decompressive laminectomy.  The patient has weighed the  risks, benefits and alternatives to surgery, decided to proceed with a  decompressive laminectomy and L-3, L-4 and L-5.   PREOPERATIVE DIAGNOSIS:  L3-4, L4-5, L5-S1 spinal stenosis, epidural  lipomatosis, lumbago, lumbar radiculopathy.   POSTOPERATIVE DIAGNOSES:  L3-4, L4-5, L5-S1 spinal stenosis, epidural  lipomatosis, lumbago, lumbar radiculopathy.   PROCEDURE:  L-3, L-4, and L-5 laminectomy with bilateral foraminotomy  and removal of the epidural lipomatosis.   SURGEON:  Cristi Loron, M.D.   ASSISTANT:  Clydene Fake, M.D.   ANESTHESIA:  General endotracheal.   ESTIMATED BLOOD LOSS:  500 mL.   SPECIMENS:  None.   DRAINS:  None.   COMPLICATIONS:  None.   PROCEDURE:  The patient was brought to the operating room by the  anesthesia team.  General endotracheal anesthesia was induced.  The  patient was turned in a prone position on the Wilson frame.  His  lumbosacral region was then prepared with Betadine scrub and Betadine  solution.  Sterile drapes were applied.  I then injected the area to be  incised with Marcaine with epinephrine  solution.  I used a scalpel to  make a linear midline incision over the L3-4, L4-5 and L5-S1  interspaces.  I used electrocautery to perform a bilateral subperiosteal  dissection exposing spinous process of lamina of L-3, L-4 and L-5  bilaterally.  We obtained intraoperative radiograph to confirm our  location.  We then inserted the cerebellar retractor for exposure.  We  began by incising the interspinous ligament at L2-3, L3-4, L4-5 and L5-  S1 with the scalpel.  We then used Leksell rongeur to remove the spinous  process of L3, L4 and L5.  We then brought the operative microscope into  the field and under image magnification and illumination we completed  the microdissection/decompression.  We used a high-speed drill to  perform a bilateral L-5, L-4, L-3 laminotomy.  We completed a  laminectomy at L-3, L-4 and L-5 with a Kerrison punch, removing the  ligamentum flavum at L2-3, L3-4 and L4-5 and L5-S1.  We encountered  severe epidural lipomatosis.  We used microdissection to free up the  epidural tissue from the underlying dura and then removed the excess  fatty tissue using the Kerrison  punches.  We sent off some epidural  tissue to pathology for identification but it looked like typical fatty  tissue.  I then used a Kerrison punch to perform bilateral  foraminotomies about the bilateral L-4, L-5 and S-1 nerve roots, again  removing quite a bit of excess fatty tissue.  At this point we had good  decompression of the thecal sac and bilateral L-4, L-5 and S-1 nerve  roots.  We did encounter upon removing the fatty tissue quite a bit of  epidural bleeding.  This was easily controlled with bipolar  electrocautery and Gelfoam.  At this point we had a good decompression.  We irrigated the wound out with bacitracin solution, removed the  retractors and then reapproximated the patient's thoracolumbar fascia  with interrupted #1 Vicryl suture, subcutaneous tissue with interrupted  2-0 Vicryl  suture and skin with Steri-Strips and Benzoin.  The wound was  then coated with bacitracin ointment, sterile dressing applied.  The  drapes were removed and the patient was subsequently returned to supine  position where he was extubated by anesthesia team and transported to  the post anesthesia care unit in stable condition.  All sponge,  instrument and needle counts correct.      Cristi Loron, M.D.  Electronically Signed     JDJ/MEDQ  D:  12/15/2006  T:  12/16/2006  Job:  [id number]

## 2011-01-22 NOTE — Consult Note (Signed)
NAME:  [name], [name] NO.:  [id number]   MEDICAL RECORD NO.:  [id number]                   PATIENT TYPE:  INP   LOCATION:  4705                                 FACILITY:  [facility]   PHYSICIAN:  [name], M.D.                DATE OF BIRTH:  11/23/1965   DATE OF CONSULTATION:  10/10/2003  DATE OF DISCHARGE:                                   CONSULTATION   CARDIOLOGY CONSULTATION   PRIMARY CARE [name]:  [name], M.D. [facility]   CARDIOLOGIST:  John does not have a cardiologist.   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  John was admitted yesterday for chest discomfort with exertion.  John states  that John was evaluated a number of years ago by a cardiologist here in  [place] and was told that John had asymmetric septal hypertrophy with  hypertrophic obstructive cardiomyopathy.  John was evaluated, at that time,  with an echocardiogram.  There was no history of hypertension at that time  and since that time John has had many, many years of exertional chest  discomfort. Yesterday John had the worst episode John has had in many years  associated with nausea, vomiting and diaphoresis.  His primary care [name]  told him that his blood pressure was 205/100 with heart rates in the 130s.  John was told to call EMS and was sent to the emergency department.  There in  the emergency department John was found to be normotensive, but mildly  tachycardic.  John was admitted for rule out myocardial infarction.   PAST MEDICAL HISTORY:  His past medical history is significant for  hypertension and the hypertrophic cardiomyopathy.  John had a heart  catheterization done on Halloween of 2003 which showed no coronary artery  disease, no renal artery stenosis, and normal left ventricular systolic  function.  There is no comment on this as to whether or not John had  assessment for left ventricular outflow obstruction, whether any dynamic  evaluation was done.  John had an echocardiogram, again, prior to this in  August 2003 which showed normal left ventricular systolic function.  No wall  motion abnormalities and mild left ventricular hypertrophy.  No mention is  made of hypertrophic obstructive cardiomyopathy, and the day after that John  had a stress Cardiolite which showed normal left ventricular systolic  function with no evidence of ischemia. John also has a history of anxiety  disorder, gastroesophageal reflux disease, chronic low back pain, question  of sleep apnea with obstructive sleep apnea.  John is on CPAP for this.  John  had a cervical fusion back in 1985, a diskectomy in 2000.   MEDICATIONS:  1. Nexium 40 mg a day.  2. Diovan 80 mg a day.  3. Aspirin 325 mg a day.  4.  Prozac 20 mg a day.  5. Vitamin B12 1000 mg every month.  6. John is taking injection to his back for his back pain.   SOCIAL HISTORY:  John lives in [place] with his wife. John works as a Education officer, environmental.  John has 3 children.  No grandchildren.  John used to smoke cigarettes, but does  not any more and has not for the last 4 years.  John does not drink any  alcohol.  John does not use any drugs.   FAMILY HISTORY:  His mother is alive at age 48 with hypertension and a  question of congestive heart failure.  She had a heart attack early in 2004.  His father passed age at age 9 of prostate cancer and John had hypertension.  John has 2 brothers with hypertension and 2 sisters with heart problems. John is  not sure exactly what their problems are.   REVIEW OF SYSTEMS:  His review of systems is significant for constipation  which is limited.  John has occasional headaches with some nasal discharge  without any significant skin rash.  John does occasionally have arthralgia and  primarily back pain. John has no other significant review of systems.   PHYSICAL EXAMINATION:  GENERAL:  On physical exam John is a  well-developed,  well-nourished, mildly obese, black male in no apparent distress.  John is  alert and oriented x4.  Very pleasant, and a very good historian.  VITAL SIGNS:  His temperature is 98.0, a pulse of 64 and regular,  respirations were 20, and his blood pressure is 101/58.  Saturating at 99%.  HEENT:  Examination of the head, eyes, ears, nose, and throat is  unremarkable.  NECK:  The neck is supple.  There is no jugular venous distention or carotid  bruits.  CHEST:  The chest is clear to auscultation bilaterally.  CARDIAC:  His cardiovascular exam reveals a nondisplaced point of maximal  impulse with no lifts or thrills.  First and second heart sounds are normal.  There is are no third or fourth heart sounds.  There is a soft 2/6 systolic  murmur noted.  There is no change with the murmur with Valsalva noted.  His  pulses are 2+ throughout.  ABDOMEN:  Abdomen is soft, nontender, normoactive bowel sounds.  GENITOURINARY AND RECTAL:  Exams are deferred.  EXTREMITIES:  Extremities were without clubbing, cyanosis, or edema.  MUSCULOSKELETAL AND NEUROLOGICAL: Exams are unremarkable.   CHEST X-RAY:  Shows no acute disease.   ELECTROCARDIOGRAM:  The electrocardiogram today shows normal sinus rhythm  with no left ventricular hypertrophy.  Intervals are normal.  Axes are  normal.  There are no ST-T wave changes seen.   LABORATORIES:  White blood cell count was 4.9; H&H was 14 and 40; platelet  count was 301.  Sodium of 136, potassium 3.9, chloride 105, bicarb 26, BUN  11, creatinine 0.9, with blood sugar 109.  John has had 2 sets of cardiac  enzymes which are not consistent with acute myocardial infarction.  His D-  dimer was 0.27, His TSH was 2.1 and his alcohol was less than 5.  His  fasting cholesterol shows a total cholesterol of 192 with triglycerides 65,  HDL of 43, LDL of 136 on no medications.  ECHOCARDIOGRAM:  His echocardiogram, of which there is a record in the  chart, shows  normal left ventricular systolic function with mild concentric  left ventricular hypertrophy, but no evidence of hypertrophic  cardiomyopathy.   ASSESSMENT:  This is a gentleman with chest pain which is very atypical for  coronary disease, and also atypical for hypertrophic cardiomyopathy.  John has  no cardiomyopathy on echocardiogram, and yet John carries that diagnosis.  John  does have hypertension and there is a history of left ventricular  hypertrophy which is concentric and one wonders if perhaps this first  diagnosis was just an overcall of hypertensive heart disease.  I think, at  this time it is probably reasonable to pursue his chest discomfort more  aggressively and I would recommend a stress Cardiolite be done.  An  alternative to this which might be even more illustrative is to do a stress  echocardiogram to look and see if John has dynamic outflow obstruction when John  exercises which would be useful, I think in the situation a stress  echocardiogram can be performed.  I would recommend that this be done as an  outpatient in our lab at [facility] under the direction of the cardiologist who can actually observe the  echocardiogram as they were being performed.  Beyond that, I think that  there is little more to do here in the hospital and I would recommend for  him to be discharged.                                               [name], M.D.    JH/MEDQ  D:  10/10/2003  T:  10/11/2003  Job:  [id number]

## 2011-01-22 NOTE — Discharge Summary (Signed)
NAMEWALDRON, GERRY NO.:  [id number]   MEDICAL RECORD NO.:  [id number]                   PATIENT TYPE:  INP   LOCATION:  4705                                 FACILITY:  [facility]   PHYSICIAN:  Rene Paci, M.D. [facility]          DATE OF BIRTH:  08-Aug-1966   DATE OF ADMISSION:  10/09/2003  DATE OF DISCHARGE:  10/11/2003                                 DISCHARGE SUMMARY   DISCHARGE DIAGNOSIS:  Atypical chest pain.   HISTORY OF PRESENT ILLNESS:  Mr. Carden is a 45 year old African American  male who had been diagnosed with hypertrophic obstructive cardiomyopathy  about 12 years prior to this admission.  The patient presented with onset of  left chest pain and arm tingling.  Duration was about 30 minutes.  This  occurred at rest and resolved without any intervention.  He had recurrent  pain on the day of admission while moving furniture.  This was associated  with nausea, vomiting, dizziness, and diaphoresis.  This last approximately  60-90 minutes.  He had no relief with nitroglycerin.   PAST MEDICAL HISTORY:  1. Hypertension.  2. Hypertrophic obstructive cardiomyopathy diagnosed 12 years ago,     previously followed by Dr. Algie Coffer.  3. Gastroesophageal reflux disease.  4. Hiatal hernia.  5. Osteoarthritis.  6. History of heart palpitations.  7. Uses CPAP at night.  8. Depression and anxiety.  9. __________ back pain, status post steroid injections.  10.      History of rheumatologic work up at [facility] in the past year.     Results are not known.   HOSPITAL COURSE:  Problem #1.  Atypical chest pain.  The patient was  admitted to rule out myocardial infarction.  Serial cardiac enzymes were  negative.  D-dimer was normal.  The patient had a history of __________.  Therefore, we did order a 2-D echo.  This was performed on October 10, 2003.  Showed preserved left ventricular function without any wall motion  abnormalities.  Left atrium was mildly  dilated.  There is vigorous left  ventricular function.  There was some upper septal thickening but there was  no SAN of the mitral valve. There was no LVOT gradient.  Per echo there was  no evidence of HOCM.  The patient was evaluated by cardiology.  They  concurred that there was no clinical or echocardiographic evidence of HOCM.  They did feel the patient would benefit from outpatient stress Cardiolite  and this has been arranged.   LABORATORY DATA AT DISCHARGE:  BMET was normal.  CBC with differential was  normal.  TSH was 2.130.  Cholesterol 192, triglycerides 65.   DISCHARGE MEDICATIONS:  He is to resume his home medications including:  1. Nexium 40 mg p.o. daily.  2. Diovan 80 mg daily.  3. Aspirin 325 mg daily.  4. Prozac 20 mg daily.  5. Ativan p.r.n.   FOLLOW UP:  The patient is to follow up for a stress Cardiolite on  Wednesday, February 9, at 7:30 a.m.  He should follow up with Dr.  Amador Cunas after that.      Cornell Barman, P.A. [facility]                  Rene Paci, M.D. [facility]    LC/MEDQ  D:  10/11/2003  T:  10/12/2003  Job:  [id number]   cc:   Vida Roller, M.D.  Fax: [phone]   Gordy Savers, M.D. [facility]

## 2011-01-22 NOTE — Discharge Summary (Signed)
[name], [name] NO.:  [id number]   MEDICAL RECORD NO.:  [id number]          PATIENT TYPE:  OBV   LOCATION:  3737                         FACILITY:  [facility]   PHYSICIAN:  Valerie A. Felicity Coyer, MDDATE OF BIRTH:  [birth year]   DATE OF ADMISSION:  10/12/2007  DATE OF DISCHARGE:  10/13/2007                               DISCHARGE SUMMARY   DISCHARGE DIAGNOSES:  1. Chest pain with associated shortness of breath, most likely      secondary to history of sarcoidosis.  2. History of sarcoidosis.  3. Depression.  4. Chronic back pain.  5. Obstructive sleep apnea.  6. Spinal stenosis.  7. B12 deficiency.  8. Dyslipidemia.  9. Arthritis.  10.Gastroesophageal reflux disease.   HISTORY OF PRESENT ILLNESS:  [name] is a 45 year old white male who  was admitted on October 12, 2007, with a chief complaint of chest pain.  He apparently has had extensive workup of chest pain in the past and  noted that at rest he started having pain in the left breast which was  aching and was associated with some shortness of breath.  He also noted  some dizziness and lightheadedness as well as mild nausea.  The symptoms  persisted.  He came to the emergency department and was admitted for  further evaluation and treatment.   COURSE OF HOSPITALIZATION:  Chest pain:  The patient was admitted and  underwent serial cardiac enzymes.  A chest x-ray showed no acute  findings.  The patient did undergo a CT angio of the chest on February 6  which showed no PE,  did note stable bilateral hilar adenopathy and  stable small pulmonary nodules compatible with the patient's known  history of sarcoidosis.  As his inpatient workup was negative, the  patient was discharged home with plans for outpatient stress test.   MEDICATIONS AT THE TIME OF DISCHARGE:  1. Altace 5 mg p.o. daily.  2. Atenolol 100 mg p.o. b.i.d.  3. Azathioprine 100 mg 2 tabs p.o. daily.  4. Catapres 0.1 mg p.o. b.i.d.  5.  Cymbalta 30-mg tabs 3 tablets p.o. daily.  6. Lipitor 40 mg p.o. daily.  7. Neurontin 300 mg 3 tablets p.o. daily.  8. OxyContin 300 mg p.o. daily.  9. Prednisone 20 mg p.o. daily.  10.Prevacid 30 mg p.o. b.i.d.  11.Wellbutrin XL 300 mg p.o. daily.  12.Flomax 0.4 mg p.o. daily.  13.Ativan 1-mg tab, 2 tabs every 6 hours as needed.  14.Promethazine 25 mg p.o. q.6 h as needed.  15.Ultram 50 mg p.o. q.6 h as needed for pain.   PERTINENT LABORATORIES AT THE TIME OF DISCHARGE:  BUN 16, creatinine  1.0, hemoglobin 14.6, hematocrit 43.5.  Serial cardiac enzymes negative.   FOLLOWUP:  The patient is instructed to follow up with Dr. Eleonore Chiquito on February 20 at 11:15 a.m.      Sandford Craze, NP      Raenette Rover. Felicity Coyer, MD  Electronically Signed    MO/MEDQ  D:  11/06/2007  T:  11/06/2007  Job:  [id number]

## 2011-01-22 NOTE — H&P (Signed)
[name], Ferguson NO.:  [id number]   MEDICAL RECORD NO.:  [id number]                   PATIENT TYPE:  INP   LOCATION:  2018                                 FACILITY:  [facility]   PHYSICIAN:  Governor Specking, N.P.           DATE OF BIRTH:  10-Oct-1965   DATE OF ADMISSION:  04/16/2002  DATE OF DISCHARGE:                                HISTORY & PHYSICAL   PRIMARY CARE PHYSICIAN:  Gordy Savers, M.D.   PRIMARY CARDIOLOGIST:  Ricki Rodriguez, M.D.   CHIEF COMPLAINT:  The patient states he has had pain in his left neck, left  shoulder, nausea, vomiting, chest fluttering x3 weeks.   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  He states that he rested that afternoon and  felt better.  The following day, he had shortness of breath with minimal  exertion and again he felt a fluttering in his chest.  He felt presyncopal  but never did pass out.  Again, he rested in bed and felt improved.  On  August 9, he began to feel increasing left shoulder pain progressing into  his neck.  He also felt that his chest was fluttering during this time.  Between August 10 and August 11, he had increased neck and shoulder pain,  chest fluttering, shortness of breath, and this time with associated nausea  and vomiting.  He had an appointment with Dr. Amador Cunas this week;  however, he called the office with these symptoms and was instructed to come  to the emergency room.  He arrived in the emergency room by private vehicle.   PAST MEDICAL HISTORY:  1. Cervical fusion, Dr. Jordan Likes.  2. Back pain secondary to facet disease, Dr. Ethelene Hal.  3. Hypertrophic obstructive cardiomyopathy, Dr. Algie Coffer.  4. History of diaphragmatic hernia.  5. History of duodenitis.  6. History of hypertension.   ALLERGIES:  No known drug allergies.   MEDICATIONS:  1. Atenolol 100 mg q.d. which was recently decreased from b.i.d.  to q.d.  2. Cartia, the dose is unknown.  The office has been called so that we may     receive the exact amount given.  3. Nexium 40 mg p.o. q.d.   SOCIAL HISTORY:  He is married, three young children.  He is a Network engineer at [employer].  He is also a Consulting civil engineer.  He  states that both jobs are very high stress positions.  Tobacco:  He quit 12  years ago.  ETOH:  He quit 12 years ago.   FAMILY HISTORY:  Father deceased prostate cancer and hypertension.  Mother  is alive, 10 years old, and she has an unclear cardiac history, may have CHF  and hypertension.  He has two brothers, one of which has hypertension.  He  has two sisters, one of which  has cardiac disease.  He does not know the  exact type.   HEALTH MAINTENANCE:  He had his yearly physical January 2003 with Dr.  Amador Cunas.   REVIEW OF SYSTEMS:  He has had a 25-pound voluntary weight loss over the  past six months.  He has had increasing fatigue for three months associated  with shortness of breath and coughing, which is nonproductive.  Otherwise,  his review of systems is as per the past medical history.   EMERGENCY ROOM COURSE:  The patient was admitted, given nitroglycerin which  provided minimal relief, then was given MSO4 at 4 mg which is providing  relief from his neck and shoulder pain.  The patient has not complained of  chest pain.  He describes it as fluttering.   LABORATORY DATA:  WBC is 3.6, H&H 14.7 and 43.1.  His monos are at 15%.  PT  and INR are at 12.7 and 0.9.  Urine macro is negative.  Troponin is 0.02.  CK total is 151, CK-MB 1.8, and relative index was 1.1.  The patient was  placed on oxygen, which he states made him feel much better.   Diagnostic EKG was normal, read by Dr. Ignacia Palma.   PHYSICAL EXAMINATION:  VITAL SIGNS:  Temperature 98.2, 124/65, 72, 20, and  96% on O2 at 2 L.  GENERAL:  He is groggy.  He was recently given MSO4 at 4 mg.  He is  cooperative.  He awakens  as the exam progresses.  NEUROLOGIC:  PERRLA is sluggish.  He is oriented x3.  CN II-XII intact with  pain in the left shoulder with his shrug.  HEART:  Regular rate and rhythm.  LUNGS:  He has a right inspiratory crackle in the mid-lobe region.  Otherwise, no wheezing and clear auscultation.  ABDOMEN:  Large, soft, nontender, positive bowel sounds.  EXTREMITIES:  No edema.  SKIN:  Intact.  HEENT:  He has no palpable adenopathy or thyromegaly.  He swallows.  MUSCULOSKELETAL:  Left shoulder pain is reproducible with movement.  Lower  extremities are without deficit.   PLAN:  1. Cardiovascular.  Since the patient is complaining of fluttering and     shortness of breath and nausea and vomiting, we will consult Dr. Algie Coffer,     who has seen him in the past, for assistance in this case.  It is very     likely that his symptoms may be related to musculoskeletal pain; however,     in this individual with IHHS, we must exclude a cardiac source.  2. Musculoskeletal pain.  We will consider reconsulting Dr. Ethelene Hal to assist     [place] with management in this case or possibly have him follow up with Dr.     Ethelene Hal on an outpatient basis.                                               Governor Specking, N.P.    ZO/XWRU  D:  04/16/2002  T:  04/19/2002  Job:  [id number]

## 2011-01-22 NOTE — H&P (Signed)
John, CORREA NO.:  Ferguson   MEDICAL RECORD NO.:  [id number]          PATIENT TYPE:  INP   LOCATION:  4740                         FACILITY:  [facility]   PHYSICIAN:  Ricki Rodriguez, M.D.  DATE OF BIRTH:  10/28/1965   DATE OF ADMISSION:  09/24/2005  DATE OF DISCHARGE:                                HISTORY & PHYSICAL   REFERRING PHYSICIAN:  Dr. Eleonore Chiquito   CHIEF COMPLAINT:  Chest pain.   HISTORY OF PRESENT ILLNESS:  This 45 year old black male complains of sharp  chest pain occasionally heavy radiating to left arm associated with some  sweating and has elevated blood pressures.  The patient has been diagnosed  with sarcoidosis and was told he may have heart involvement.   PAST MEDICAL HISTORY:  Negative for diabetes.  Positive for hypertension.  No history of smoking, alcohol use, drug use, elevated cholesterol level,  myocardial infarction.  Positive obesity.  Negative exercise.   FAMILY HISTORY:  Negative family history of premature coronary artery  disease.   PAST SURGICAL HISTORY:  Biopsy on both leg muscles in 2004.  Neck surgery  for cervical spine fusion in 1997.   MEDICATIONS:  1.  Cymbalta 60 mg one daily.  2.  Atenolol 100 mg twice daily.  3.  Clonidine 0.2 mg one twice daily.  4.  Prevacid 30 mg one twice daily.  5.  Prednisone 20 mg one twice daily since 3 months for neurosarcoidosis.  6.  Neurontin 300 mg three times daily.  7.  Percocet 7.5/325 mg two tablets three times daily.  8.  Lorazepam 1 mg at bedtime.   ALLERGIES:  None.   PERSONAL HISTORY:  The patient is married.  Wife's name is Darl Pikes; she is 71  years old and healthy.  The patient has two daughters and one son; the son  has allergies and reflux disease.  The patient is currently out of work for  4 months; he used to [employer].   FAMILY HISTORY:  Mother is living at age 84 with hypertension and heart  trouble, also has blood clots and renal disease.   Father died of cancer of  the prostate at age 24.  The patient has two brothers living and well and  two sisters one with hypertension and one with sarcoidosis.   REVIEW OF SYSTEMS:  Positive weight gain of 20 pounds in 6 months.  Positive  vision change.  Wears glasses.  Negative contacts.  Negative eye surgery.  Negative hearing loss, tinnitus, rhinorrhea or denture use.  Also negative  cough, hemoptysis, COPD, pneumonias.  Positive asthma, dyspnea, chest pain,  leg claudication, GI bleed and skin rash and joint pains.  Negative stomach  ulcer, hepatitis, blood transfusion, kidney stones, stroke, seizures,  psychiatric admissions.  The patient had flu shot 6 months ago.  No history  of pneumonia shot.   PHYSICAL EXAMINATION:  VITAL SIGNS:  Pulse 86, respirations 20, blood  pressure 135/76, height 5 feet 9 inches, weight 260 pounds.  GENERAL:  The patient is alert, oriented x3.  HEENT:  The patient is  normocephalic, atraumatic.  EYES:  Manson Passey, pupils equal and reactive to light, extraocular movements  intact.  EARS/NOSE AND THROAT:  Mucous membranes pink and moist.  NECK:  No JVD.  No carotid bruit.  LUNGS:  Clear bilaterally.  HEART:  Normal S1, S2.  ABDOMEN:  Soft and nontender.  EXTREMITIES:  No edema, cyanosis, or clubbing.  CNS:  Cranial nerves grossly intact.  The patient has bilateral equal grips  and is right handed.   LABORATORY DATA:  Pending.   ELECTROCARDIOGRAM:  EKG done at office shows normal sinus rhythm with  possible left atrial enlargement and possible old lateral wall infarct with  poor R wave progression.   ASSESSMENT:  1.  Chest pain rule out myocardial infarction.  2.  Hypertension.  3.  Sarcoidosis.  4.  Anxiety and depression.   RECOMMENDATIONS:  Admit the patient to telemetry unit, rule out myocardial  infarction, consider nuclear stress test and continue home medications.      Ricki Rodriguez, M.D.  Electronically Signed     ASK/MEDQ  D:   09/24/2005  T:  09/25/2005  Job:  [id number]

## 2011-02-25 ENCOUNTER — Other Ambulatory Visit: Payer: Self-pay | Admitting: Internal Medicine

## 2011-04-18 ENCOUNTER — Other Ambulatory Visit: Payer: Self-pay | Admitting: Internal Medicine

## 2011-05-02 ENCOUNTER — Other Ambulatory Visit: Payer: Self-pay | Admitting: Internal Medicine

## 2011-05-25 ENCOUNTER — Ambulatory Visit (INDEPENDENT_AMBULATORY_CARE_PROVIDER_SITE_OTHER): Payer: Medicare Other | Admitting: Internal Medicine

## 2011-05-25 DIAGNOSIS — Z0000 Encounter for general adult medical examination without abnormal findings: Secondary | ICD-10-CM

## 2011-05-25 DIAGNOSIS — K219 Gastro-esophageal reflux disease without esophagitis: Secondary | ICD-10-CM

## 2011-05-25 DIAGNOSIS — G894 Chronic pain syndrome: Secondary | ICD-10-CM

## 2011-05-25 LAB — LIPID PANEL
Cholesterol: 141 mg/dL (ref 0–200)
HDL: 55.7 mg/dL (ref >39.00)
LDL Cholesterol: 72 mg/dL (ref 0–99)
Total CHOL/HDL Ratio: 3
Triglycerides: 69 mg/dL (ref 0.0–149.0)
VLDL: 13.8 mg/dL (ref 0.0–40.0)

## 2011-05-25 LAB — TSH: TSH: 1.67 u[IU]/mL (ref 0.35–5.50)

## 2011-05-25 LAB — HEPATIC FUNCTION PANEL
ALT: 17 U/L (ref 0–53)
AST: 22 U/L (ref 0–37)
Albumin: 4.4 g/dL (ref 3.5–5.2)
Alkaline Phosphatase: 35 U/L — ABNORMAL LOW (ref 39–117)
Bilirubin, Direct: 0 mg/dL (ref 0.0–0.3)
Total Bilirubin: 0.5 mg/dL (ref 0.3–1.2)
Total Protein: 7.2 g/dL (ref 6.0–8.3)

## 2011-05-25 LAB — BASIC METABOLIC PANEL
BUN: 15 mg/dL (ref 6–23)
CO2: 26 mEq/L (ref 19–32)
Calcium: 9.2 mg/dL (ref 8.4–10.5)
Chloride: 106 mEq/L (ref 96–112)
Creatinine, Ser: 1.1 mg/dL (ref 0.4–1.5)
GFR: 95.65 mL/min (ref >60.00)
Glucose, Bld: 85 mg/dL (ref 70–99)
Potassium: 4.2 mEq/L (ref 3.5–5.1)
Sodium: 139 mEq/L (ref 135–145)

## 2011-05-26 LAB — CBC WITH DIFFERENTIAL/PLATELET
Basophils Absolute: 0 10*3/uL (ref 0.0–0.1)
Basophils Relative: 0.7 % (ref 0.0–3.0)
Eosinophils Absolute: 0.1 10*3/uL (ref 0.0–0.7)
Eosinophils Relative: 2.8 % (ref 0.0–5.0)
HCT: 40.5 % (ref 39.0–52.0)
Hemoglobin: 13.5 g/dL (ref 13.0–17.0)
Lymphocytes Relative: 25.9 % (ref 12.0–46.0)
Lymphs Abs: 0.8 10*3/uL (ref 0.7–4.0)
MCHC: 33.2 g/dL (ref 30.0–36.0)
MCV: 89.9 fl (ref 78.0–100.0)
Monocytes Absolute: 0.6 10*3/uL (ref 0.1–1.0)
Monocytes Relative: 20.2 % — ABNORMAL HIGH (ref 3.0–12.0)
Neutro Abs: 1.5 10*3/uL (ref 1.4–7.7)
Neutrophils Relative %: 50.4 % (ref 43.0–77.0)
Platelets: 313 10*3/uL (ref 150.0–400.0)
RBC: 4.51 Mil/uL (ref 4.22–5.81)
RDW: 14.9 % — ABNORMAL HIGH (ref 11.5–14.6)
WBC: 3 10*3/uL — ABNORMAL LOW (ref 4.5–10.5)

## 2011-05-27 NOTE — Progress Notes (Signed)
  Subjective:    Patient ID: [name], male    DOB: 08/23/1966, 45 y.o.   MRN: [id number]  HPI    Review of Systems     Objective:   Physical Exam        Assessment & Plan:  As a short stay seconds it is low ones that we did on the EMR was down to her

## 2011-05-28 LAB — CBC
HCT: 43.5
Hemoglobin: 14.6
MCHC: 33.6
MCV: 87.1
Platelets: 366
RBC: 4.99
RDW: 14.5
WBC: 7.3

## 2011-05-28 LAB — DIFFERENTIAL
Basophils Absolute: 0
Basophils Relative: 0
Eosinophils Absolute: 0.1
Eosinophils Relative: 1
Lymphocytes Relative: 8 — ABNORMAL LOW
Lymphs Abs: 0.6 — ABNORMAL LOW
Monocytes Absolute: 0.9
Monocytes Relative: 13 — ABNORMAL HIGH
Neutro Abs: 5.6
Neutrophils Relative %: 78 — ABNORMAL HIGH

## 2011-05-28 LAB — TROPONIN I
Troponin I: 0.01
Troponin I: <0.01

## 2011-05-28 LAB — I-STAT 8, (EC8 V) (CONVERTED LAB)
Acid-base deficit: 2
BUN: 16
Bicarbonate: 22.8
Chloride: 108
Glucose, Bld: 113 — ABNORMAL HIGH
HCT: 46
Hemoglobin: 15.6
Operator id: 257131
Potassium: 4.2
Sodium: 140
TCO2: 24
pCO2, Ven: 38.4 — ABNORMAL LOW
pH, Ven: 7.381 — ABNORMAL HIGH

## 2011-05-28 LAB — POCT I-STAT CREATININE
Creatinine, Ser: 1
Operator id: 257131

## 2011-05-28 LAB — POCT CARDIAC MARKERS
CKMB, poc: <1 — ABNORMAL LOW
Myoglobin, poc: 30.4
Operator id: 257131
Troponin i, poc: <0.05

## 2011-05-28 LAB — CK TOTAL AND CKMB (NOT AT ARMC)
CK, MB: 1.3
CK, MB: 1.6
Relative Index: INVALID
Relative Index: INVALID
Total CK: 79
Total CK: 85

## 2011-06-07 LAB — CARDIAC PANEL(CRET KIN+CKTOT+MB+TROPI)
CK, MB: 1.4
CK, MB: 1.8
CK, MB: 1.9
Relative Index: 0.7
Relative Index: 1
Relative Index: 1
Total CK: 138
Total CK: 185
Total CK: 270 — ABNORMAL HIGH
Troponin I: 0.01
Troponin I: 0.01
Troponin I: <0.01

## 2011-06-07 LAB — CBC
HCT: 40.5
HCT: 42.8
Hemoglobin: 13.6
Hemoglobin: 14.6
MCHC: 33.6
MCHC: 34.2
MCV: 86
MCV: 87.7
Platelets: 302
Platelets: 341
RBC: 4.62
RBC: 4.97
RDW: 13.9
RDW: 14.2
WBC: 5.9
WBC: 6.6

## 2011-06-07 LAB — POCT I-STAT, CHEM 8
BUN: 13
Calcium, Ion: 1.18
Chloride: 104
Creatinine, Ser: 1.2
Glucose, Bld: 98
HCT: 44
Hemoglobin: 15
Potassium: 4.3
Sodium: 138
TCO2: 26

## 2011-06-07 LAB — PROTIME-INR
INR: 1
Prothrombin Time: 13.5

## 2011-06-07 LAB — COMPREHENSIVE METABOLIC PANEL
ALT: 26
AST: 22
Albumin: 3.8
Alkaline Phosphatase: 40
BUN: 10
CO2: 28
Calcium: 9.1
Chloride: 101
Creatinine, Ser: 1.04
GFR calc Af Amer: >60
GFR calc non Af Amer: >60
Glucose, Bld: 97
Potassium: 4
Sodium: 135
Total Bilirubin: 1
Total Protein: 6.4

## 2011-06-07 LAB — POCT CARDIAC MARKERS
CKMB, poc: 2.8
Myoglobin, poc: 123
Troponin i, poc: <0.05

## 2011-06-07 LAB — D-DIMER, QUANTITATIVE: D-Dimer, Quant: <0.22

## 2011-06-07 LAB — DIFFERENTIAL
Basophils Absolute: 0
Basophils Relative: 0
Eosinophils Absolute: 0.2
Eosinophils Relative: 2
Lymphocytes Relative: 13
Lymphs Abs: 0.8
Monocytes Absolute: 0.8
Monocytes Relative: 13 — ABNORMAL HIGH
Neutro Abs: 4.7
Neutrophils Relative %: 72

## 2011-06-07 LAB — TSH: TSH: 0.759

## 2011-06-07 LAB — B-NATRIURETIC PEPTIDE (CONVERTED LAB): Pro B Natriuretic peptide (BNP): <30

## 2011-06-21 LAB — CBC
HCT: 39.8
HCT: 40.6
HCT: 41.6
Hemoglobin: 13.3
Hemoglobin: 13.6
Hemoglobin: 14
MCHC: 33.4
MCHC: 33.5
MCHC: 33.6
MCV: 85.1
MCV: 85.3
MCV: 85.6
Platelets: 361
Platelets: 375
Platelets: 389
RBC: 4.68
RBC: 4.76
RBC: 4.86
RDW: 14
RDW: 14.3 — ABNORMAL HIGH
RDW: 14.8 — ABNORMAL HIGH
WBC: 6.1
WBC: 6.6
WBC: 7.2

## 2011-06-21 LAB — CK TOTAL AND CKMB (NOT AT ARMC)
CK, MB: 2
Relative Index: INVALID
Total CK: 82

## 2011-06-21 LAB — BASIC METABOLIC PANEL
BUN: 7
BUN: 8
CO2: 26
CO2: 30
Calcium: 8.9
Calcium: 9.5
Chloride: 104
Chloride: 108
Creatinine, Ser: 0.93
Creatinine, Ser: 1.18
GFR calc Af Amer: >60
GFR calc Af Amer: >60
GFR calc non Af Amer: >60
GFR calc non Af Amer: >60
Glucose, Bld: 91
Glucose, Bld: 99
Potassium: 4
Potassium: 4.2
Sodium: 140
Sodium: 141

## 2011-06-21 LAB — URINE CULTURE: Colony Count: 3000

## 2011-06-21 LAB — I-STAT 8, (EC8 V) (CONVERTED LAB)
Acid-base deficit: 1
BUN: 9
Bicarbonate: 22.8
Chloride: 107
Glucose, Bld: 124 — ABNORMAL HIGH
HCT: 46
Hemoglobin: 15.6
Operator id: 189501
Potassium: 4.6
Sodium: 139
TCO2: 24
pCO2, Ven: 34.3 — ABNORMAL LOW
pH, Ven: 7.431 — ABNORMAL HIGH

## 2011-06-21 LAB — POCT I-STAT CREATININE
Creatinine, Ser: 0.9
Operator id: 189501

## 2011-06-21 LAB — PROTIME-INR
INR: 1
Prothrombin Time: 13.6

## 2011-06-21 LAB — URINALYSIS, ROUTINE W REFLEX MICROSCOPIC
Bilirubin Urine: NEGATIVE
Glucose, UA: NEGATIVE
Hgb urine dipstick: NEGATIVE
Ketones, ur: NEGATIVE
Nitrite: NEGATIVE
Protein, ur: NEGATIVE
Specific Gravity, Urine: 1.043 — ABNORMAL HIGH
Urobilinogen, UA: 1
pH: 6

## 2011-06-21 LAB — DIFFERENTIAL
Basophils Absolute: 0
Basophils Relative: 0
Eosinophils Absolute: 0.1
Eosinophils Relative: 1
Lymphocytes Relative: 19
Lymphs Abs: 1.2
Monocytes Absolute: 0.8 — ABNORMAL HIGH
Monocytes Relative: 12 — ABNORMAL HIGH
Neutro Abs: 4.1
Neutrophils Relative %: 67

## 2011-06-21 LAB — CARDIAC PANEL(CRET KIN+CKTOT+MB+TROPI)
CK, MB: 2.3
Relative Index: INVALID
Total CK: 95
Troponin I: 0.06

## 2011-06-21 LAB — POCT CARDIAC MARKERS
CKMB, poc: <1 — ABNORMAL LOW
Myoglobin, poc: 59.4
Operator id: 189501
Troponin i, poc: <0.05

## 2011-06-21 LAB — APTT: aPTT: 38 — ABNORMAL HIGH

## 2011-06-21 LAB — TSH: TSH: 1.409

## 2011-06-21 LAB — TROPONIN I: Troponin I: 0.07 — ABNORMAL HIGH

## 2011-06-22 LAB — URINALYSIS, ROUTINE W REFLEX MICROSCOPIC
Bilirubin Urine: NEGATIVE
Glucose, UA: NEGATIVE
Hgb urine dipstick: NEGATIVE
Ketones, ur: NEGATIVE
Nitrite: NEGATIVE
Protein, ur: NEGATIVE
Specific Gravity, Urine: 1.007
Urobilinogen, UA: 0.2
pH: 6

## 2011-07-01 ENCOUNTER — Other Ambulatory Visit: Payer: Self-pay | Admitting: Internal Medicine

## 2011-07-12 ENCOUNTER — Other Ambulatory Visit: Payer: Self-pay | Admitting: Neurosurgery

## 2011-07-12 DIAGNOSIS — M542 Cervicalgia: Secondary | ICD-10-CM

## 2011-07-16 ENCOUNTER — Ambulatory Visit
Admission: RE | Admit: 2011-07-16 | Discharge: 2011-07-16 | Disposition: A | Discharge status: Home / Self Care | Payer: Medicare Other | Source: Ambulatory Visit | Attending: Neurosurgery | Admitting: Neurosurgery

## 2011-07-16 DIAGNOSIS — M542 Cervicalgia: Secondary | ICD-10-CM

## 2011-07-24 ENCOUNTER — Other Ambulatory Visit: Payer: Self-pay | Admitting: Internal Medicine

## 2011-08-09 ENCOUNTER — Encounter: Payer: Self-pay | Admitting: Internal Medicine

## 2011-08-09 ENCOUNTER — Ambulatory Visit (INDEPENDENT_AMBULATORY_CARE_PROVIDER_SITE_OTHER): Payer: Medicare Other | Admitting: Internal Medicine

## 2011-08-09 DIAGNOSIS — I428 Other cardiomyopathies: Secondary | ICD-10-CM

## 2011-08-09 DIAGNOSIS — L989 Disorder of the skin and subcutaneous tissue, unspecified: Secondary | ICD-10-CM

## 2011-08-09 DIAGNOSIS — I10 Essential (primary) hypertension: Secondary | ICD-10-CM

## 2011-08-09 NOTE — Progress Notes (Signed)
  Subjective:    Patient ID: [name], male    DOB: 02-15-1966, 45 y.o.   MRN: [id number]  HPI  45 year old patient who has multiple medical problems. This includes hypertension and hypertrophic cardiomyopathy. He generally has been stable. His chief complaint today is a lesion involving his left forearm region. This has been present for a number of months. It is slightly tender. There's been no drainage;  no obvious history of a foreign body    Review of Systems  Skin: Positive for wound.       Objective:   Physical Exam  Constitutional: He is oriented to person, place, and time. He appears well-developed.       Overweight  Weight 229  Blood pressure 110/68  HENT:  Head: Normocephalic.  Right Ear: External ear normal.  Left Ear: External ear normal.  Eyes: Conjunctivae and EOM are normal.  Neck: Normal range of motion.  Cardiovascular: Normal rate and regular rhythm.   Murmur heard.      Grade 2-3/6 high-pitched systolic murmur  Pulmonary/Chest: Breath sounds normal.  Abdominal: Bowel sounds are normal.  Musculoskeletal: Normal range of motion. He exhibits no edema and no tenderness.  Neurological: He is alert and oriented to person, place, and time.  Skin:       An approximate 8 mm ulcerated nodule present involving the outer forearm just distal to the elbow; This appears to be inflammatory but BCE cannot be excluded.  Psychiatric: He has a normal mood and affect. His behavior is normal.          Assessment & Plan:   Ulcerated nodule left forearm 8 mm. This appears to be inflammatory but cannot exclude a BCE. We'll set up for excision  Hypertension stable Hypertrophic myopathy stable  We'll recheck in 6 months

## 2011-08-10 ENCOUNTER — Other Ambulatory Visit: Payer: Self-pay | Admitting: Internal Medicine

## 2011-08-16 ENCOUNTER — Encounter: Payer: Self-pay | Admitting: Family Medicine

## 2011-08-16 ENCOUNTER — Ambulatory Visit (INDEPENDENT_AMBULATORY_CARE_PROVIDER_SITE_OTHER): Payer: Medicare Other | Admitting: Family Medicine

## 2011-08-16 DIAGNOSIS — L720 Epidermal cyst: Secondary | ICD-10-CM | POA: Insufficient documentation

## 2011-08-16 DIAGNOSIS — L723 Sebaceous cyst: Secondary | ICD-10-CM

## 2011-08-16 NOTE — Progress Notes (Signed)
  Subjective:    Patient ID: [name], male    DOB: 07/02/1966, 45 y.o.   MRN: [id number]  HPI [name] is a 45 year old male, who has a history of numerous medical problems, including sarcoidosis, who comes in today on referral from Dr. Kirtland Bouchard. For me to remove a lesion from his left forearm.  The patient states that about 6 months ago.  He noticed a bump on his left forearm and its increasingly gotten bigger over the last 6 months.  Dr. Kirtland Bouchard has advised him to have it removed.  No previous history of skin cancer.  After informed consent, the lesion was cleaned with alcohol and anesthetized with 1% Xylocaine with epinephrine.  The lesion measured 15 x 15 mm it was excised in toto.  The base was cauterized.  Band-Aid was applied.  It was sent for pathologic analysis.  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

## 2011-08-16 NOTE — Patient Instructions (Signed)
We remove the Band-Aid tomorrow.  If we do not call you with the path report.  Call us in two weeks

## 2011-08-17 ENCOUNTER — Encounter (HOSPITAL_COMMUNITY): Payer: Self-pay | Admitting: Emergency Medicine

## 2011-08-17 ENCOUNTER — Inpatient Hospital Stay (HOSPITAL_COMMUNITY)
Admission: EM | Admit: 2011-08-17 | Discharge: 2011-08-19 | DRG: 866 | Disposition: A | Discharge status: Home / Self Care | Payer: Medicare Other | Source: Ambulatory Visit | Attending: Internal Medicine | Admitting: Internal Medicine

## 2011-08-17 ENCOUNTER — Other Ambulatory Visit: Payer: Self-pay

## 2011-08-17 ENCOUNTER — Emergency Department (HOSPITAL_COMMUNITY): Payer: Medicare Other

## 2011-08-17 DIAGNOSIS — E785 Hyperlipidemia, unspecified: Secondary | ICD-10-CM

## 2011-08-17 DIAGNOSIS — K219 Gastro-esophageal reflux disease without esophagitis: Secondary | ICD-10-CM

## 2011-08-17 DIAGNOSIS — D869 Sarcoidosis, unspecified: Secondary | ICD-10-CM

## 2011-08-17 DIAGNOSIS — G894 Chronic pain syndrome: Secondary | ICD-10-CM

## 2011-08-17 DIAGNOSIS — I422 Other hypertrophic cardiomyopathy: Secondary | ICD-10-CM | POA: Diagnosis present

## 2011-08-17 DIAGNOSIS — F329 Major depressive disorder, single episode, unspecified: Secondary | ICD-10-CM

## 2011-08-17 DIAGNOSIS — R002 Palpitations: Secondary | ICD-10-CM

## 2011-08-17 DIAGNOSIS — E538 Deficiency of other specified B group vitamins: Secondary | ICD-10-CM

## 2011-08-17 DIAGNOSIS — Z87891 Personal history of nicotine dependence: Secondary | ICD-10-CM

## 2011-08-17 DIAGNOSIS — L720 Epidermal cyst: Secondary | ICD-10-CM

## 2011-08-17 DIAGNOSIS — E739 Lactose intolerance, unspecified: Secondary | ICD-10-CM

## 2011-08-17 DIAGNOSIS — Z87442 Personal history of urinary calculi: Secondary | ICD-10-CM

## 2011-08-17 DIAGNOSIS — R079 Chest pain, unspecified: Secondary | ICD-10-CM | POA: Diagnosis present

## 2011-08-17 DIAGNOSIS — I10 Essential (primary) hypertension: Secondary | ICD-10-CM

## 2011-08-17 DIAGNOSIS — G4730 Sleep apnea, unspecified: Secondary | ICD-10-CM

## 2011-08-17 DIAGNOSIS — R509 Fever, unspecified: Secondary | ICD-10-CM

## 2011-08-17 DIAGNOSIS — J111 Influenza due to unidentified influenza virus with other respiratory manifestations: Secondary | ICD-10-CM

## 2011-08-17 DIAGNOSIS — IMO0002 Reserved for concepts with insufficient information to code with codable children: Secondary | ICD-10-CM

## 2011-08-17 DIAGNOSIS — M1990 Unspecified osteoarthritis, unspecified site: Secondary | ICD-10-CM

## 2011-08-17 DIAGNOSIS — Z981 Arthrodesis status: Secondary | ICD-10-CM

## 2011-08-17 DIAGNOSIS — E7800 Pure hypercholesterolemia, unspecified: Secondary | ICD-10-CM

## 2011-08-17 DIAGNOSIS — J09X9 Influenza due to identified novel influenza A virus with other manifestations: Principal | ICD-10-CM | POA: Diagnosis present

## 2011-08-17 DIAGNOSIS — F3289 Other specified depressive episodes: Secondary | ICD-10-CM

## 2011-08-17 DIAGNOSIS — Z79899 Other long term (current) drug therapy: Secondary | ICD-10-CM

## 2011-08-17 DIAGNOSIS — I428 Other cardiomyopathies: Secondary | ICD-10-CM

## 2011-08-17 LAB — POCT I-STAT, CHEM 8
BUN: 5 mg/dL — ABNORMAL LOW (ref 6–23)
Calcium, Ion: 1.18 mmol/L (ref 1.12–1.32)
Chloride: 103 mEq/L (ref 96–112)
Creatinine, Ser: 1.2 mg/dL (ref 0.50–1.35)
Glucose, Bld: 90 mg/dL (ref 70–99)
HCT: 43 % (ref 39.0–52.0)
Hemoglobin: 14.6 g/dL (ref 13.0–17.0)
Potassium: 4.1 mEq/L (ref 3.5–5.1)
Sodium: 142 mEq/L (ref 135–145)
TCO2: 27 mmol/L (ref 0–100)

## 2011-08-17 LAB — DIFFERENTIAL
Basophils Absolute: 0 10*3/uL (ref 0.0–0.1)
Basophils Relative: 1 % (ref 0–1)
Eosinophils Absolute: 0.1 10*3/uL (ref 0.0–0.7)
Eosinophils Relative: 3 % (ref 0–5)
Lymphocytes Relative: 13 % (ref 12–46)
Lymphs Abs: 0.5 10*3/uL — ABNORMAL LOW (ref 0.7–4.0)
Monocytes Absolute: 0.9 10*3/uL (ref 0.1–1.0)
Monocytes Relative: 24 % — ABNORMAL HIGH (ref 3–12)
Neutro Abs: 2.1 10*3/uL (ref 1.7–7.7)
Neutrophils Relative %: 60 % (ref 43–77)

## 2011-08-17 LAB — CBC
HCT: 41 % (ref 39.0–52.0)
Hemoglobin: 14.1 g/dL (ref 13.0–17.0)
MCH: 30.1 pg (ref 26.0–34.0)
MCHC: 34.4 g/dL (ref 30.0–36.0)
MCV: 87.6 fL (ref 78.0–100.0)
Platelets: 224 10*3/uL (ref 150–400)
RBC: 4.68 MIL/uL (ref 4.22–5.81)
RDW: 13.8 % (ref 11.5–15.5)
WBC: 3.6 10*3/uL — ABNORMAL LOW (ref 4.0–10.5)

## 2011-08-17 LAB — POCT I-STAT TROPONIN I: Troponin i, poc: 0 ng/mL (ref 0.00–0.08)

## 2011-08-17 MED ORDER — NITROGLYCERIN 0.4 MG SL SUBL
0.4000 mg | SUBLINGUAL_TABLET | SUBLINGUAL | Status: DC | PRN
  Administered 2011-08-17: 0.4 mg via SUBLINGUAL

## 2011-08-17 NOTE — ED Provider Notes (Signed)
History     CSN: [id number] Arrival date & time: 08/17/2011  9:59 PM   First MD Initiated Contact with Patient 08/17/11 2256      No chief complaint on file.   (Consider location/radiation/quality/duration/timing/severity/associated sxs/prior treatment) HPI Comments: Chief Complaint: Chest Pain  [name] 45 y.o. male  History obtained from: patient and spouse/SO  The patient complains of pain in the  mid chest,The pain is described as 8 /10. Quality is heaviness, Onset was gradual, Timing of pain is intermittent, Course is and more persistent over last few hours Radiation - the pain radiates to the left neck/jaw, Exacerbating factors:  The pain is worse with deep inspiration and walking, Aleviating factors:  The pain improves with rest, Associated symptoms include shortness of breath, mild cough which has started overnight as well as some runny nose and some headache.  Does not include diaphoresis, nausea, near syncope, palpitations and syncope, The patient does not have a history of cardiac disease. 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    Past Medical History  Diagnosis Date  . B12 DEFICIENCY 03/06/2007  . CARDIOMYOPATHY, IDIOPATHIC HYPERTROPHIC 03/06/2007  . DEPRESSION 03/06/2007  . GERD 06/01/2007  . HYPERCHOLESTEROLEMIA 03/06/2007  . HYPERLIPIDEMIA 11/08/2008  . HYPERTENSION 03/06/2007  . IMPAIRED GLUCOSE TOLERANCE 11/08/2008  . NEPHROLITHIASIS, HX OF 04/07/2010  . 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 OSTEOARTHRITIS 06/01/2007  . Palpitations 10/23/2007  . SLEEP APNEA 03/06/2007  . SYNDROME, CHRONIC PAIN 03/06/2007    Past Surgical History  Procedure Date  . Decompression and fusion     cervicothoracic  . Partial nephrectomy   . Cardiac catheterization   . Lumbar fusion   . Prostate surgery    robotic prostatectomy    Family History  Problem Relation Age of Onset  . Heart disease Neg Hx     family hx CHF  . Kidney disease Neg Hx     family hx   . Cancer Neg Hx     family hx prostate    History  Substance Use Topics  . Smoking status: Former Smoker    Quit date: 09/07/1983  . Smokeless tobacco: Never Used  . Alcohol Use: No      Review of Systems  All other systems reviewed and are negative.    Allergies  Review of patient's allergies indicates no known allergies.  Home Medications   Current Outpatient Rx  Name Route Sig Dispense Refill  . ATENOLOL 50 MG PO TABS  TAKE 1 TABLET BY MOUTH TWICE DAILY 180 tablet 3    CYCLE FILL MEDICATION. Authorization is required f ...  . ATORVASTATIN CALCIUM 40 MG PO TABS  TAKE ONE TABLET BY MOUTH DAILY 90 tablet 3    CYCLE FILL MEDICATION. Authorization is required f ...  . AZASAN 100 MG PO TABS  TAKE ONE TABLET BY MOUTH TWICE DAILY 180 tablet 1    CYCLE FILL MEDICATION. Authorization is required f ...  . AZATHIOPRINE 50 MG PO TABS Oral Take 50 mg by mouth 2 (two) times daily.      . BUPROPION HCL ER (XL) 300 MG PO TB24  TAKE ONE TABLET BY MOUTH DAILY 90 tablet 1    CYCLE FILL MEDICATION. Authorization is required f ...  . CYANOCOBALAMIN 1000 MCG/ML IJ SOLN Intramuscular Inject 1,000  mcg into the muscle every 30 (thirty) days.      . CYCLOSPORINE 0.05 % OP EMUL  1 drop 2 (two) times daily.     . CYMBALTA 30 MG PO CPEP  TAKE ONE CAPSULE BY MOUTH DAILY 90 each 3    CYCLE FILL MEDICATION. Authorization is required f ...  . DIAZEPAM 5 MG PO TABS Oral Take 5 mg by mouth 2 (two) times daily as needed.      Marland Kitchen DICYCLOMINE HCL 10 MG PO CAPS  TAKE 1 CAPSULE BY MOUTH 4 TIMES DAILY 360 capsule 3    CYCLE FILL MEDICATION. Authorization is required f ...  . ESOMEPRAZOLE MAGNESIUM 40 MG PO CPDR Oral Take 40 mg by mouth 2 (two) times daily.     Marland Kitchen GABAPENTIN 300 MG PO CAPS  TAKE 3 CAPSULES BY MOUTH FOUR TIMES DAILY 360 capsule 2    CYCLE  FILL MEDICATION. Authorization is required f ...  . GLUCOSE BLOOD VI STRP Other 1 each by Other route 5 (five) times daily. Use as instructed     . OXYBUTYNIN CHLORIDE ER 5 MG PO TB24 Oral Take 5 mg by mouth 2 (two) times daily.      . OXYCODONE-ACETAMINOPHEN 10-325 MG PO TABS Oral Take 1 tablet by mouth every 6 (six) hours as needed.      Marland Kitchen POTASSIUM CHLORIDE 10 MEQ PO TBCR Oral Take 20 mEq by mouth 2 (two) times daily.      Marland Kitchen PREDNISONE 5 MG PO TABS Oral Take 5 mg by mouth daily.      Marland Kitchen PROMETHAZINE HCL 25 MG PO TABS Oral Take 25 mg by mouth every 6 (six) hours as needed.      Marland Kitchen TAMSULOSIN HCL 0.4 MG PO CAPS  TAKE 1 CAPSULE BY MOUTH DAILY 90 capsule 3    CYCLE FILL MEDICATION. Authorization is required f ...  . TRAMADOL HCL 50 MG PO TABS  TAKE ONE TABLET BY MOUTH EVERY 6 HOURS AS NEEDED FOR PAIN 180 tablet 1    CYCLE FILL MEDICATION. Authorization is required f ...    BP 111/47  Pulse 83  Temp(Src) 100.1 F (37.8 C) (Oral)  Resp 14  SpO2 97%  Physical Exam  Nursing note and vitals reviewed. Constitutional: He appears well-developed and well-nourished. No distress.  HENT:  Head: Normocephalic and atraumatic.  Mouth/Throat: Oropharynx is clear and moist. No oropharyngeal exudate.  Eyes: Conjunctivae and EOM are normal. Pupils are equal, round, and reactive to light. Right eye exhibits no discharge. Left eye exhibits no discharge. No scleral icterus.  Neck: Normal range of motion. Neck supple. No JVD present. No thyromegaly present.  Cardiovascular: Normal rate, regular rhythm, normal heart sounds and intact distal pulses.  Exam reveals no gallop and no friction rub.   No murmur heard. Pulmonary/Chest: Effort normal and breath sounds normal. No respiratory distress. He has no wheezes. He has no rales. He exhibits no tenderness.  Abdominal: Soft. Bowel sounds are normal. He exhibits no distension and no mass. There is no tenderness.  Musculoskeletal: Normal range of motion. He exhibits  no edema and no tenderness.  Lymphadenopathy:    He has no cervical adenopathy.  Neurological: He is alert. Coordination normal.  Skin: Skin is warm and dry. No rash noted. No erythema.  Psychiatric: He has a normal mood and affect. His behavior is normal.    ED Course  Procedures (including critical care time)  Labs Reviewed  CBC - Abnormal; Notable for the  following:    WBC 3.6 (*)    All other components within normal limits  DIFFERENTIAL - Abnormal; Notable for the following:    Lymphs Abs 0.5 (*)    Monocytes Relative 24 (*)    All other components within normal limits  POCT I-STAT, CHEM 8 - Abnormal; Notable for the following:    BUN 5 (*)    All other components within normal limits  POCT I-STAT TROPONIN I  APTT  PROTIME-INR  POCT I-STAT TROPONIN I  I-STAT, CHEM 8  I-STAT TROPONIN I  I-STAT TROPONIN I   Dg Chest 2 View  08/17/2011  *RADIOLOGY REPORT*  Clinical Data: And fever.  CHEST - 2 VIEW  Comparison: 03/16/2010  Findings: The lungs are clear without focal infiltrate, edema, pneumothorax or pleural effusion. The cardiopericardial silhouette is within normal limits for size. Interstitial markings are diffusely coarsened with chronic features. Imaged bony structures of the thorax are intact.  IMPRESSION: Mild chronic interstitial coarsening.  No acute cardiopulmonary process.  Original Report Authenticated By: ERIC A. MANSELL, M.D.   Ct Angio Chest W/cm &/or Wo Cm  08/18/2011  *RADIOLOGY REPORT*  Clinical Data:  Chest pain with cough.  History of clot in the right knee.  CT ANGIOGRAPHY CHEST WITH CONTRAST  Technique:  Multidetector CT imaging of the chest was performed using the standard protocol during bolus administration of intravenous contrast.  Multiplanar CT image reconstructions including MIPs were obtained to evaluate the vascular anatomy.  Contrast: 80mL OMNIPAQUE IOHEXOL 300 MG/ML IV SOLN  Comparison:  10/13/2007  Findings:  Technically adequate study with  relatively good opacification of the central and proximal segmental pulmonary arteries.  No focal filling defects.  No evidence of significant pulmonary embolus.  Diffuse bilateral hilar and mediastinal lymph node prominence is again demonstrated with enlarged left infrahilar node measuring about 2.6 x 1.5 cm.  This represents enlargement since the previous study.  Right hilar lymph nodes are also enlarged mildly in the interval.  Right paratracheal lymph node measures about 9 mm short axis dimension.  Axillary lymph nodes are not significantly enlarged.  To nodules in the right middle lung, each measuring about 5 mm diameter.  These are stable since the prior study.  Atelectasis in the lung bases.  Review of the MIP images confirms the above findings.  IMPRESSION: No evidence of significant pulmonary embolus.  Mediastinal and hilar lymphadenopathy, slightly increased since previous study. Right middle lobe nodules, stable.  Changes are consistent with history of sarcoidosis. Atelectasis in the lung bases.  Original Report Authenticated By: Marlon Pel, M.D.     1. Chest pain   2. Hypotension   3. Fever       MDM  Patient is well appearing, has vital signs which are unremarkable other than a fever of 102.4 documented in the last hour. He has some upper respiratory symptoms including runny nose, headache and a developing cough overnight. This is consistent with a slightly worsening heaviness in his chest with a cough. There is no signs of pulmonary embolism including no tachycardia no asymmetry, recent surgery or hormone therapy. He is on multiple medications for other medical problems and does have some cardiac risk factors. EKG is negative for acute ischemia and in fact is unchanged from 03/16/2010. Versed of cardiac markers is negative. Plan is to discuss with cardiology, treat as infectious most likely  ED ECG REPORT   Date: 08/18/2011   Rate: 81  Rhythm: normal sinus rhythm  QRS Axis:  normal  Intervals: normal  ST/T Wave abnormalities: normal  Conduction Disutrbances:none  Narrative Interpretation:   Old EKG Reviewed: unchanged from 03/16/2010   The troponin was normal, CBC without leukocytosis and electrolytes normal - BMP is normal.  CXR without acute findings.  Discussed the patient's care with Dr. Mayford Knife of cardiology who agrees with observational admission to the CDU for rule out. The patient has not improved with nitroglycerin, Toradol ordered.   CT scan negative for pulmonary embolism or a moderate infiltrate. Patient developed hypotension with his fever and concern for sepsis. There is no elevated leukocytosis, normal cardiac enzymes and clear lungs. Hypotension has been treated with IV fluids with some success and currently the blood pressure has improved from 85 systolic to 100 systolic. Case discussed with hospitalist who will admit.  Vida Roller, MD 08/18/11 [phone]

## 2011-08-17 NOTE — ED Notes (Signed)
RETURNED FROM X-RAY , WAITING FOR EDP/PA EVALUATION , FAMILY AT BEDSIDE , IV SALINE LOCK UNREMARKABLE , NO CHEST PAIN AT THIS TIME.

## 2011-08-18 ENCOUNTER — Emergency Department (HOSPITAL_COMMUNITY): Payer: Medicare Other

## 2011-08-18 DIAGNOSIS — R079 Chest pain, unspecified: Secondary | ICD-10-CM | POA: Diagnosis present

## 2011-08-18 DIAGNOSIS — R509 Fever, unspecified: Secondary | ICD-10-CM | POA: Diagnosis present

## 2011-08-18 DIAGNOSIS — D869 Sarcoidosis, unspecified: Secondary | ICD-10-CM | POA: Diagnosis present

## 2011-08-18 DIAGNOSIS — J111 Influenza due to unidentified influenza virus with other respiratory manifestations: Secondary | ICD-10-CM | POA: Diagnosis present

## 2011-08-18 LAB — POCT I-STAT TROPONIN I: Troponin i, poc: 0 ng/mL (ref 0.00–0.08)

## 2011-08-18 LAB — APTT: aPTT: 36 seconds (ref 24–37)

## 2011-08-18 LAB — PROTIME-INR
INR: 1.09 (ref 0.00–1.49)
Prothrombin Time: 14.3 seconds (ref 11.6–15.2)

## 2011-08-18 MED ORDER — DIAZEPAM 5 MG PO TABS: 5.0000 mg | ORAL_TABLET | Freq: Two times a day (BID) | ORAL | Status: DC | PRN

## 2011-08-18 MED ORDER — TRAZODONE HCL 50 MG PO TABS
50.0000 mg | ORAL_TABLET | Freq: Every evening | ORAL | Status: DC | PRN
  Filled 2011-08-18: qty 1

## 2011-08-18 MED ORDER — ACETAMINOPHEN 500 MG PO TABS
1000.0000 mg | ORAL_TABLET | Freq: Four times a day (QID) | ORAL | Status: DC | PRN
  Administered 2011-08-18 – 2011-08-19 (×2): 1000 mg via ORAL
  Filled 2011-08-18 (×2): qty 2

## 2011-08-18 MED ORDER — OXYCODONE HCL 5 MG PO TABS: 5.0000 mg | ORAL_TABLET | Freq: Four times a day (QID) | ORAL | Status: DC | PRN

## 2011-08-18 MED ORDER — ONDANSETRON HCL 4 MG/2ML IJ SOLN: 4.0000 mg | Freq: Four times a day (QID) | INTRAMUSCULAR | Status: DC | PRN

## 2011-08-18 MED ORDER — ATENOLOL 50 MG PO TABS
50.0000 mg | ORAL_TABLET | Freq: Every day | ORAL | Status: DC
  Administered 2011-08-18 – 2011-08-19 (×2): 50 mg via ORAL
  Filled 2011-08-18 (×2): qty 1

## 2011-08-18 MED ORDER — SIMVASTATIN 20 MG PO TABS
20.0000 mg | ORAL_TABLET | Freq: Every day | ORAL | Status: DC
  Administered 2011-08-18: 20 mg via ORAL
  Filled 2011-08-18 (×3): qty 1

## 2011-08-18 MED ORDER — OSELTAMIVIR PHOSPHATE 75 MG PO CAPS
75.0000 mg | ORAL_CAPSULE | Freq: Every day | ORAL | Status: DC
  Administered 2011-08-18 – 2011-08-19 (×2): 75 mg via ORAL
  Filled 2011-08-18 (×2): qty 1

## 2011-08-18 MED ORDER — ALBUTEROL SULFATE (5 MG/ML) 0.5% IN NEBU: 2.5000 mg | INHALATION_SOLUTION | RESPIRATORY_TRACT | Status: DC | PRN

## 2011-08-18 MED ORDER — OXYCODONE-ACETAMINOPHEN 10-325 MG PO TABS: 1.0000 | ORAL_TABLET | Freq: Four times a day (QID) | ORAL | Status: DC | PRN

## 2011-08-18 MED ORDER — GUAIFENESIN ER 600 MG PO TB12
600.0000 mg | ORAL_TABLET | Freq: Two times a day (BID) | ORAL | Status: DC
  Administered 2011-08-18 – 2011-08-19 (×3): 600 mg via ORAL
  Filled 2011-08-18 (×5): qty 1

## 2011-08-18 MED ORDER — MORPHINE SULFATE 4 MG/ML IJ SOLN
4.0000 mg | Freq: Once | INTRAMUSCULAR | Status: AC
  Administered 2011-08-18: 4 mg via INTRAVENOUS
  Filled 2011-08-18: qty 1

## 2011-08-18 MED ORDER — CYCLOSPORINE 0.05 % OP EMUL
1.0000 [drp] | Freq: Two times a day (BID) | OPHTHALMIC | Status: DC
  Administered 2011-08-18 (×2): 1 [drp] via OPHTHALMIC
  Filled 2011-08-18 (×6): qty 1

## 2011-08-18 MED ORDER — AZATHIOPRINE 50 MG PO TABS
50.0000 mg | ORAL_TABLET | Freq: Two times a day (BID) | ORAL | Status: DC
  Administered 2011-08-18 – 2011-08-19 (×3): 50 mg via ORAL
  Filled 2011-08-18 (×5): qty 1

## 2011-08-18 MED ORDER — MORPHINE SULFATE 4 MG/ML IJ SOLN
4.0000 mg | INTRAMUSCULAR | Status: DC | PRN
  Administered 2011-08-18 (×4): 4 mg via INTRAVENOUS
  Filled 2011-08-18 (×4): qty 1

## 2011-08-18 MED ORDER — DULOXETINE HCL 20 MG PO CPEP
40.0000 mg | ORAL_CAPSULE | Freq: Every day | ORAL | Status: DC
  Administered 2011-08-18 – 2011-08-19 (×2): 40 mg via ORAL
  Filled 2011-08-18 (×2): qty 2

## 2011-08-18 MED ORDER — ACETAMINOPHEN 325 MG PO TABS
ORAL_TABLET | ORAL | Status: AC
  Administered 2011-08-18: 975 mg
  Filled 2011-08-18: qty 3

## 2011-08-18 MED ORDER — PANTOPRAZOLE SODIUM 40 MG PO TBEC
40.0000 mg | DELAYED_RELEASE_TABLET | Freq: Every day | ORAL | Status: DC
  Administered 2011-08-18: 40 mg via ORAL
  Filled 2011-08-18: qty 1

## 2011-08-18 MED ORDER — CYANOCOBALAMIN 1000 MCG/ML IJ SOLN: 1000.0000 ug | INTRAMUSCULAR | Status: DC

## 2011-08-18 MED ORDER — ONDANSETRON HCL 4 MG PO TABS: 4.0000 mg | ORAL_TABLET | Freq: Four times a day (QID) | ORAL | Status: DC | PRN

## 2011-08-18 MED ORDER — IOHEXOL 300 MG/ML  SOLN
80.0000 mL | Freq: Once | INTRAMUSCULAR | Status: AC | PRN
  Administered 2011-08-18: 80 mL via INTRAVENOUS

## 2011-08-18 MED ORDER — PREDNISONE 5 MG PO TABS
5.0000 mg | ORAL_TABLET | Freq: Every day | ORAL | Status: DC
  Administered 2011-08-18 – 2011-08-19 (×2): 5 mg via ORAL
  Filled 2011-08-18 (×2): qty 1

## 2011-08-18 MED ORDER — GABAPENTIN 300 MG PO CAPS
300.0000 mg | ORAL_CAPSULE | Freq: Two times a day (BID) | ORAL | Status: DC
  Administered 2011-08-18 – 2011-08-19 (×3): 300 mg via ORAL
  Filled 2011-08-18 (×6): qty 1

## 2011-08-18 MED ORDER — SENNOSIDES-DOCUSATE SODIUM 8.6-50 MG PO TABS
1.0000 | ORAL_TABLET | Freq: Every evening | ORAL | Status: DC | PRN
  Filled 2011-08-18: qty 1

## 2011-08-18 MED ORDER — ALBUTEROL SULFATE (5 MG/ML) 0.5% IN NEBU
2.5000 mg | INHALATION_SOLUTION | Freq: Four times a day (QID) | RESPIRATORY_TRACT | Status: DC
  Administered 2011-08-18 – 2011-08-19 (×5): 2.5 mg via RESPIRATORY_TRACT
  Filled 2011-08-18 (×5): qty 0.5

## 2011-08-18 MED ORDER — IBUPROFEN 800 MG PO TABS
800.0000 mg | ORAL_TABLET | Freq: Three times a day (TID) | ORAL | Status: DC
  Administered 2011-08-18 – 2011-08-19 (×2): 800 mg via ORAL
  Filled 2011-08-18 (×4): qty 1

## 2011-08-18 MED ORDER — POTASSIUM CHLORIDE CRYS ER 20 MEQ PO TBCR
20.0000 meq | EXTENDED_RELEASE_TABLET | Freq: Every day | ORAL | Status: DC
  Administered 2011-08-18 – 2011-08-19 (×2): 20 meq via ORAL
  Filled 2011-08-18 (×2): qty 1

## 2011-08-18 MED ORDER — OXYCODONE-ACETAMINOPHEN 5-325 MG PO TABS
1.0000 | ORAL_TABLET | Freq: Four times a day (QID) | ORAL | Status: DC | PRN
  Administered 2011-08-18: 1 via ORAL
  Filled 2011-08-18: qty 1

## 2011-08-18 MED ORDER — IPRATROPIUM BROMIDE 0.02 % IN SOLN
0.5000 mg | Freq: Four times a day (QID) | RESPIRATORY_TRACT | Status: DC
  Administered 2011-08-18 – 2011-08-19 (×5): 0.5 mg via RESPIRATORY_TRACT
  Filled 2011-08-18 (×5): qty 2.5

## 2011-08-18 MED ORDER — SODIUM CHLORIDE 0.9 % IV SOLN: INTRAVENOUS | Status: DC

## 2011-08-18 MED ORDER — SODIUM CHLORIDE 0.9 % IV BOLUS (SEPSIS)
1000.0000 mL | Freq: Once | INTRAVENOUS | Status: AC
  Administered 2011-08-18: 1000 mL via INTRAVENOUS

## 2011-08-18 MED ORDER — OXYBUTYNIN CHLORIDE ER 5 MG PO TB24
5.0000 mg | ORAL_TABLET | Freq: Two times a day (BID) | ORAL | Status: DC
  Administered 2011-08-18 – 2011-08-19 (×3): 5 mg via ORAL
  Filled 2011-08-18 (×5): qty 1

## 2011-08-18 MED ORDER — PHENOL 1.4 % MT LIQD
1.0000 | OROMUCOSAL | Status: DC | PRN
  Filled 2011-08-18: qty 177

## 2011-08-18 MED ORDER — MENTHOL 3 MG MT LOZG
1.0000 | LOZENGE | OROMUCOSAL | Status: DC | PRN
  Filled 2011-08-18: qty 9

## 2011-08-18 MED ORDER — BUPROPION HCL ER (XL) 300 MG PO TB24
300.0000 mg | ORAL_TABLET | Freq: Every day | ORAL | Status: DC
  Administered 2011-08-18 – 2011-08-19 (×2): 300 mg via ORAL
  Filled 2011-08-18 (×2): qty 1

## 2011-08-18 MED ORDER — KETOROLAC TROMETHAMINE 30 MG/ML IJ SOLN
30.0000 mg | Freq: Once | INTRAMUSCULAR | Status: AC
  Administered 2011-08-18: 30 mg via INTRAVENOUS
  Filled 2011-08-18: qty 1

## 2011-08-18 NOTE — ED Notes (Signed)
CALLED FLOOR TO GIVE REPORT, NURSE UNABLE TO RECEIVE REPORT AT THIS TIME - BUSY WITH PT.

## 2011-08-18 NOTE — ED Notes (Signed)
Pt is alert and oriented and reports cough and pressure in chest for 4 days.  Pt is running a temperature and he has had decrease in pain with morphine.  Pt is awaiting a private room for droplet precautions.  NSR on the monitor.  Lungs clear

## 2011-08-18 NOTE — ED Notes (Signed)
Pt asleep at this time

## 2011-08-18 NOTE — ED Notes (Signed)
Patient transported to CT 

## 2011-08-18 NOTE — H&P (Signed)
Hospital Admission Note Date: 08/18/2011  Patient name: [name] Medical record number: [id number] Date of birth: 12/12/1965 Age: 45 y.o. Gender: male PCP: Rogelia Boga, MD  Chief Complaint: chest pain  History of Present Illness: 45 yo with history of sarcoid presents to ED with complaints of chest discomfort which he describes as a heaviness.  It is associated with shortness of breath.  Some wheezing.  The pain has been intermitant for weeks, but today it lasted for 8 hours.  Subjective fevers and cough,too  Meds: Medications Prior to Admission  Medication Dose Route Frequency Provider Last Rate Last Dose  . iohexol (OMNIPAQUE) 300 MG/ML solution 80 mL  80 mL Intravenous Once PRN Medication Radiologist   80 mL at 08/18/11 0323  . ketorolac (TORADOL) 30 MG/ML injection 30 mg  30 mg Intravenous Once Vida Roller, MD   30 mg at 08/18/11 0036  . morphine 4 MG/ML injection 4 mg  4 mg Intravenous Once Vida Roller, MD   4 mg at 08/18/11 0318  . morphine 4 MG/ML injection 4 mg  4 mg Intravenous Q4H PRN Buena Irish, MD      . nitroGLYCERIN (NITROSTAT) SL tablet 0.4 mg  0.4 mg Sublingual Q5 Min x 3 PRN Vida Roller, MD   0.4 mg at 08/17/11 2347  . sodium chloride 0.9 % bolus 1,000 mL  1,000 mL Intravenous Once Vida Roller, MD   1,000 mL at 08/18/11 8657   Medications Prior to Admission  Medication Sig Dispense Refill  . atenolol (TENORMIN) 50 MG tablet TAKE 1 TABLET BY MOUTH TWICE DAILY  180 tablet  3  . atorvastatin (LIPITOR) 40 MG tablet TAKE ONE TABLET BY MOUTH DAILY  90 tablet  3  . AZASAN 100 MG tablet TAKE ONE TABLET BY MOUTH TWICE DAILY  180 tablet  1  . azaTHIOprine (IMURAN) 50 MG tablet Take 50 mg by mouth 2 (two) times daily.        Marland Kitchen buPROPion (WELLBUTRIN XL) 300 MG 24 hr tablet TAKE ONE TABLET BY MOUTH DAILY  90 tablet  1  . cyanocobalamin (COBAL-1000) 1000 MCG/ML injection Inject 1,000 mcg into the muscle every 30 (thirty) days.        .  cycloSPORINE (RESTASIS) 0.05 % ophthalmic emulsion 1 drop 2 (two) times daily.       . CYMBALTA 30 MG capsule TAKE ONE CAPSULE BY MOUTH DAILY  90 each  3  . diazepam (VALIUM) 5 MG tablet Take 5 mg by mouth 2 (two) times daily as needed.        . dicyclomine (BENTYL) 10 MG capsule TAKE 1 CAPSULE BY MOUTH 4 TIMES DAILY  360 capsule  3  . esomeprazole (NEXIUM) 40 MG capsule Take 40 mg by mouth 2 (two) times daily.       Marland Kitchen gabapentin (NEURONTIN) 300 MG capsule TAKE 3 CAPSULES BY MOUTH FOUR TIMES DAILY  360 capsule  2  . glucose blood test strip 1 each by Other route 5 (five) times daily. Use as instructed       . oxybutynin (DITROPAN-XL) 5 MG 24 hr tablet Take 5 mg by mouth 2 (two) times daily.        Marland Kitchen oxyCODONE-acetaminophen (PERCOCET) 10-325 MG per tablet Take 1 tablet by mouth every 6 (six) hours as needed.        . potassium chloride (KLOR-CON) 10 MEQ CR tablet Take 20 mEq by mouth 2 (two) times daily.        Marland Kitchen  predniSONE (DELTASONE) 5 MG tablet Take 5 mg by mouth daily.        . promethazine (PHENERGAN) 25 MG tablet Take 25 mg by mouth every 6 (six) hours as needed.        . Tamsulosin HCl (FLOMAX) 0.4 MG CAPS TAKE 1 CAPSULE BY MOUTH DAILY  90 capsule  3  . traMADol (ULTRAM) 50 MG tablet TAKE ONE TABLET BY MOUTH EVERY 6 HOURS AS NEEDED FOR PAIN  180 tablet  1    Allergies: Review of patient's allergies indicates no known allergies. Past Medical History  Diagnosis Date  . B12 DEFICIENCY 03/06/2007  . CARDIOMYOPATHY, IDIOPATHIC HYPERTROPHIC 03/06/2007  . DEPRESSION 03/06/2007  . GERD 06/01/2007  . HYPERCHOLESTEROLEMIA 03/06/2007  . HYPERLIPIDEMIA 11/08/2008  . HYPERTENSION 03/06/2007  . IMPAIRED GLUCOSE TOLERANCE 11/08/2008  . NEPHROLITHIASIS, HX OF 04/07/2010  . 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 OSTEOARTHRITIS 06/01/2007  . Palpitations 10/23/2007  . SLEEP APNEA 03/06/2007  . SYNDROME, CHRONIC PAIN 03/06/2007   Past Surgical History  Procedure Date  . Decompression and fusion     cervicothoracic    . Partial nephrectomy   . Cardiac catheterization   . Lumbar fusion   . Prostate surgery     robotic prostatectomy   Family History  Problem Relation Age of Onset  . Heart disease Neg Hx     family hx CHF  . Kidney disease Neg Hx     family hx   . Cancer Neg Hx     family hx prostate   History   Social History  . Marital Status: Married    Spouse Name: N/A    Number of Children: N/A  . Years of Education: N/A   Occupational History  . Not on file.   Social History Main Topics  . Smoking status: Former Smoker    Quit date: 09/07/1983  . Smokeless tobacco: Never Used  . Alcohol Use: No  . Drug Use: No  . Sexually Active: Not on file   Other Topics Concern  . Not on file   Social History Narrative  . No narrative on file    Review of Systems: Review of Systems  Constitutional: Positive for fever, chills and weight loss.       5olbs in 6 mo  HENT: Negative for hearing loss, ear pain, congestion and sore throat.   Eyes: Negative for discharge.  Respiratory: Positive for sputum production, shortness of breath and wheezing. Negative for cough, hemoptysis and stridor.   Cardiovascular: Negative for palpitations, claudication, leg swelling and PND.  Gastrointestinal: Positive for nausea. Negative for heartburn, diarrhea, constipation, blood in stool and melena.  Genitourinary: Negative for dysuria, urgency, frequency, hematuria and flank pain.  Musculoskeletal: Negative for myalgias.  Skin: Negative for itching and rash.  Neurological: Negative for dizziness, tingling, tremors, speech change, focal weakness, seizures, loss of consciousness and headaches.  Endo/Heme/Allergies: Negative for polydipsia.     Filed Vitals:   08/18/11 0115 08/18/11 0213 08/18/11 0311 08/18/11 0504  BP: 99/55 94/48 111/47 112/71  Pulse: 87 82 83 81  Temp:   100.1 F (37.8 C) 100.3 F (37.9 C)  TempSrc:   Oral   Resp: 15 14  16   SpO2: 96% 97% 97% 97%    Physical Exam: Physical  Exam  Constitutional: He is oriented to person, place, and time. He appears well-developed and well-nourished. No distress.  HENT:  Head: Normocephalic and atraumatic.  Mouth/Throat: No oropharyngeal exudate.  Eyes: Conjunctivae and EOM are normal. Pupils  are equal, round, and reactive to light. Right eye exhibits no discharge. Left eye exhibits no discharge. No scleral icterus.  Neck: No JVD present. No tracheal deviation present. No thyromegaly present.  Cardiovascular: Normal rate.  Exam reveals no gallop and no friction rub.   No murmur heard. Pulmonary/Chest: No stridor. No respiratory distress. He has no wheezes. He has no rales. He exhibits no tenderness.  Abdominal: Soft. Bowel sounds are normal. He exhibits no distension and no mass. There is no tenderness. There is no rebound and no guarding.  Musculoskeletal: He exhibits no edema and no tenderness.  Lymphadenopathy:    He has no cervical adenopathy.  Neurological: He is alert and oriented to person, place, and time. He displays normal reflexes. No cranial nerve deficit. He exhibits abnormal muscle tone. Coordination normal.  Skin: Skin is dry. Rash noted. He is not diaphoretic. There is erythema. There is pallor.     Lab results: Results for orders placed during the hospital encounter of 08/17/11  CBC      Component Value Range   WBC 3.6 (*) 4.0 - 10.5 (K/uL)   RBC 4.68  4.22 - 5.81 (MIL/uL)   Hemoglobin 14.1  13.0 - 17.0 (g/dL)   HCT 16.1  09.6 - 04.5 (%)   MCV 87.6  78.0 - 100.0 (fL)   MCH 30.1  26.0 - 34.0 (pg)   MCHC 34.4  30.0 - 36.0 (g/dL)   RDW 40.9  81.1 - 91.4 (%)   Platelets 224  150 - 400 (K/uL)  DIFFERENTIAL      Component Value Range   Neutrophils Relative 60  43 - 77 (%)   Neutro Abs 2.1  1.7 - 7.7 (K/uL)   Lymphocytes Relative 13  12 - 46 (%)   Lymphs Abs 0.5 (*) 0.7 - 4.0 (K/uL)   Monocytes Relative 24 (*) 3 - 12 (%)   Monocytes Absolute 0.9  0.1 - 1.0 (K/uL)   Eosinophils Relative 3  0 - 5 (%)    Eosinophils Absolute 0.1  0.0 - 0.7 (K/uL)   Basophils Relative 1  0 - 1 (%)   Basophils Absolute 0.0  0.0 - 0.1 (K/uL)  POCT I-STAT, CHEM 8      Component Value Range   Sodium 142  135 - 145 (mEq/L)   Potassium 4.1  3.5 - 5.1 (mEq/L)   Chloride 103  96 - 112 (mEq/L)   BUN 5 (*) 6 - 23 (mg/dL)   Creatinine, Ser 7.82  0.50 - 1.35 (mg/dL)   Glucose, Bld 90  70 - 99 (mg/dL)   Calcium, Ion 9.56  2.13 - 1.32 (mmol/L)   TCO2 27  0 - 100 (mmol/L)   Hemoglobin 14.6  13.0 - 17.0 (g/dL)   HCT 08.6  57.8 - 46.9 (%)  POCT I-STAT TROPONIN I      Component Value Range   Troponin i, poc 0.00  0.00 - 0.08 (ng/mL)   Comment 3           APTT      Component Value Range   aPTT 36  24 - 37 (seconds)  PROTIME-INR      Component Value Range   Prothrombin Time 14.3  11.6 - 15.2 (seconds)   INR 1.09  0.00 - 1.49   POCT I-STAT TROPONIN I      Component Value Range   Troponin i, poc 0.00  0.00 - 0.08 (ng/mL)   Comment 3  Imaging results:  Dg Chest 2 View  08/17/2011  *RADIOLOGY REPORT*  Clinical Data: And fever.  CHEST - 2 VIEW  Comparison: 03/16/2010  Findings: The lungs are clear without focal infiltrate, edema, pneumothorax or pleural effusion. The cardiopericardial silhouette is within normal limits for size. Interstitial markings are diffusely coarsened with chronic features. Imaged bony structures of the thorax are intact.  IMPRESSION: Mild chronic interstitial coarsening.  No acute cardiopulmonary process.  Original Report Authenticated By: ERIC A. MANSELL, M.D.   Ct Angio Chest W/cm &/or Wo Cm  08/18/2011  *RADIOLOGY REPORT*  Clinical Data:  Chest pain with cough.  History of clot in the right knee.  CT ANGIOGRAPHY CHEST WITH CONTRAST  Technique:  Multidetector CT imaging of the chest was performed using the standard protocol during bolus administration of intravenous contrast.  Multiplanar CT image reconstructions including MIPs were obtained to evaluate the vascular anatomy.  Contrast:  80mL OMNIPAQUE IOHEXOL 300 MG/ML IV SOLN  Comparison:  10/13/2007  Findings:  Technically adequate study with relatively good opacification of the central and proximal segmental pulmonary arteries.  No focal filling defects.  No evidence of significant pulmonary embolus.  Diffuse bilateral hilar and mediastinal lymph node prominence is again demonstrated with enlarged left infrahilar node measuring about 2.6 x 1.5 cm.  This represents enlargement since the previous study.  Right hilar lymph nodes are also enlarged mildly in the interval.  Right paratracheal lymph node measures about 9 mm short axis dimension.  Axillary lymph nodes are not significantly enlarged.  To nodules in the right middle lung, each measuring about 5 mm diameter.  These are stable since the prior study.  Atelectasis in the lung bases.  Review of the MIP images confirms the above findings.  IMPRESSION: No evidence of significant pulmonary embolus.  Mediastinal and hilar lymphadenopathy, slightly increased since previous study. Right middle lobe nodules, stable.  Changes are consistent with history of sarcoidosis. Atelectasis in the lung bases.  Original Report Authenticated By: Marlon Pel, M.D.   Other results: Normal sinus rhythm/ I personally reviewed the EKG.  Assessment & Plan by Problem:  #1.  Chest pain, atypical- Patient hurt for at least 8 hours prior to pain being relieved by Nitro.  Rule out MI by cardiac enzymes.  No other testing planned at this point.  #2.  Fever of 102/bronchitis.  Likely Influenza like illness.    #3.  Sarcoid-  Patient has never been found th have pulmonary involvement before.  He should see his primary doctor for repeat scan.     Patient Active Problem List  Diagnoses  . NEUROSARCOIDOSIS  . B12 DEFICIENCY  . IMPAIRED GLUCOSE TOLERANCE  . HYPERCHOLESTEROLEMIA  . HYPERLIPIDEMIA  . DEPRESSION  . SYNDROME, CHRONIC PAIN  . HYPERTENSION  . CARDIOMYOPATHY, IDIOPATHIC HYPERTROPHIC  .  GERD  . OSTEOARTHRITIS  . SLEEP APNEA  . PALPITATIONS  . NEPHROLITHIASIS, HX OF  . Dermoid inclusion cyst  . Fever  . Flu  . Chest pain  . Sarcoid     Code status:  full  Greater than 70 minutes was spent on direct patient care and coordination of care.  Daelen Belvedere 08/18/2011, 5:59 AM

## 2011-08-18 NOTE — ED Notes (Signed)
Carb mod diet ordered from service response center per pt request for breakfast.

## 2011-08-18 NOTE — ED Notes (Signed)
Called RT to come and give albuterol and atrovent txs that are ordered for 8am. She sts it will be a little while as she is giving txs on the floor.

## 2011-08-18 NOTE — ED Notes (Signed)
Called to tell Christiana Fuchs, RN that pt was given 975mg  of tylenol per ed protocol for fever. Request for phyisican to be called done, but no call back yet.

## 2011-08-18 NOTE — H&P (Signed)
PCP:   Rogelia Boga, MD   Chief Complaint:  sick  HPI:45 yo man coming in with severe muscle aches, high fevers and headache. He also has a cardiac history.     Review of Systems:  The patient denies anorexia,  weight loss, vision loss, decreased hearing, hoarseness, syncope, dyspnea on exertion, peripheral edema, balance deficits, hemoptysis, abdominal pain, melena, hematochezia, severe indigestion/heartburn, hematuria, incontinence, genital sores, muscle weakness, suspicious skin lesions, transient blindness, difficulty walking, depression, unusual weight change, abnormal bleeding, enlarged lymph nodes, angioedema, and breast masses.  Past Medical History: Past Medical History  Diagnosis Date  . B12 DEFICIENCY 03/06/2007  . CARDIOMYOPATHY, IDIOPATHIC HYPERTROPHIC 03/06/2007  . DEPRESSION 03/06/2007  . GERD 06/01/2007  . HYPERCHOLESTEROLEMIA 03/06/2007  . HYPERLIPIDEMIA 11/08/2008  . HYPERTENSION 03/06/2007  . IMPAIRED GLUCOSE TOLERANCE 11/08/2008  . NEPHROLITHIASIS, HX OF 04/07/2010  . 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 OSTEOARTHRITIS 06/01/2007  . Palpitations 10/23/2007  . SLEEP APNEA 03/06/2007  . SYNDROME, CHRONIC PAIN 03/06/2007   Past Surgical History  Procedure Date  . Decompression and fusion     cervicothoracic  . Partial nephrectomy   . Cardiac catheterization   . Lumbar fusion   . Prostate surgery     robotic prostatectomy    Medications: Prior to Admission medications   Medication Sig Start Date End Date Taking? Authorizing Provider  atenolol (TENORMIN) 50 MG tablet TAKE 1 TABLET BY MOUTH TWICE DAILY 01/05/11  Yes Mirian Mo Kwiatkowski  atorvastatin (LIPITOR) 40 MG tablet TAKE ONE TABLET BY MOUTH DAILY 12/07/10  Yes Rogelia Boga  AZASAN 100 MG tablet TAKE ONE TABLET BY MOUTH TWICE DAILY 07/24/11  Yes Rogelia Boga  azaTHIOprine (IMURAN) 50 MG tablet Take 50 mg by mouth 2 (two) times daily.     Yes Historical Provider, MD  buPROPion (WELLBUTRIN  XL) 300 MG 24 hr tablet TAKE ONE TABLET BY MOUTH DAILY 02/25/11  Yes Rogelia Boga  cyanocobalamin (COBAL-1000) 1000 MCG/ML injection Inject 1,000 mcg into the muscle every 30 (thirty) days.    Yes Historical Provider, MD  cycloSPORINE (RESTASIS) 0.05 % ophthalmic emulsion 1 drop 2 (two) times daily.    Yes Historical Provider, MD  CYMBALTA 30 MG capsule TAKE ONE CAPSULE BY MOUTH DAILY 01/05/11  Yes Rogelia Boga  diazepam (VALIUM) 5 MG tablet Take 5 mg by mouth 2 (two) times daily as needed.     Yes Historical Provider, MD  dicyclomine (BENTYL) 10 MG capsule TAKE 1 CAPSULE BY MOUTH 4 TIMES DAILY 07/01/11  Yes Rogelia Boga  esomeprazole (NEXIUM) 40 MG capsule Take 40 mg by mouth 2 (two) times daily.    Yes Historical Provider, MD  gabapentin (NEURONTIN) 300 MG capsule TAKE 3 CAPSULES BY MOUTH FOUR TIMES DAILY 07/24/11  Yes Mirian Mo Kwiatkowski  glucose blood test strip 1 each by Other route 5 (five) times daily. Use as instructed    Yes Historical Provider, MD  oxybutynin (DITROPAN-XL) 5 MG 24 hr tablet Take 5 mg by mouth 2 (two) times daily.     Yes Historical Provider, MD  oxyCODONE-acetaminophen (PERCOCET) 10-325 MG per tablet Take 1 tablet by mouth every 6 (six) hours as needed.     Yes Historical Provider, MD  potassium chloride (KLOR-CON) 10 MEQ CR tablet Take 20 mEq by mouth 2 (two) times daily.     Yes Historical Provider, MD  predniSONE (DELTASONE) 5 MG tablet Take 5 mg by mouth daily.     Yes Historical Provider, MD  promethazine (  PHENERGAN) 25 MG tablet Take 25 mg by mouth every 6 (six) hours as needed.     Yes Historical Provider, MD  Tamsulosin HCl (FLOMAX) 0.4 MG CAPS TAKE 1 CAPSULE BY MOUTH DAILY 08/10/11  Yes Rogelia Boga  traMADol (ULTRAM) 50 MG tablet TAKE ONE TABLET BY MOUTH EVERY 6 HOURS AS NEEDED FOR PAIN 08/10/11  Yes Rogelia Boga    Allergies:  No Known Allergies  Social History:  reports that he quit smoking about 27  years ago. He has never used smokeless tobacco. He reports that he does not drink alcohol or use illicit drugs.  History   Social History Narrative  . No narrative on file     Family History: Family History  Problem Relation Age of Onset  . Heart disease Neg Hx     family hx CHF  . Kidney disease Neg Hx     family hx   . Cancer Neg Hx     family hx prostate    Physical Exam: Filed Vitals:   08/18/11 1053 08/18/11 1337 08/18/11 1502 08/18/11 1550  BP: 101/58 117/70 127/72   Pulse: 84 89 94   Temp: 101 F (38.3 C) 103 F (39.4 C) 103 F (39.4 C)   TempSrc: Oral Oral Oral   Resp: 21 18 18    Height:   5\' 10"  (1.778 m)   Weight:   101.5 kg (223 lb 12.3 oz)   SpO2: 95% 98% 94% 94%   General appearance: alert, cooperative and appears stated age Head: Normocephalic, without obvious abnormality, atraumatic Eyes: conjunctivae/corneas clear. PERRL, EOM's intact. Fundi benign. Throat: lips, mucosa, and tongue normal; teeth and gums normal Neck: no adenopathy, no carotid bruit, no JVD, supple, symmetrical, trachea midline and thyroid not enlarged, symmetric, no tenderness/mass/nodules Back: symmetric, no curvature. ROM normal. No CVA tenderness. Resp: clear to auscultation bilaterally Chest wall: no tenderness GI: soft, non-tender; bowel sounds normal; no masses,  no organomegaly Extremities: extremities normal, atraumatic, no cyanosis or edema Skin: Skin color, texture, turgor normal. No rashes or lesions   Labs on Admission:   [facility] 08/17/11 2225  NA 142  K 4.1  CL 103  CO2 --  GLUCOSE 90  BUN 5*  CREATININE 1.20  CALCIUM --  MG --  PHOS --   No results found for this basename: AST:2,ALT:2,ALKPHOS:2,BILITOT:2,PROT:2,ALBUMIN:2 in the last 72 hours No results found for this basename: LIPASE:2,AMYLASE:2 in the last 72 hours  Basename 08/17/11 2225 08/17/11 2207  WBC -- 3.6*  NEUTROABS -- 2.1  HGB 14.6 14.1  HCT 43.0 41.0  MCV -- 87.6  PLT -- 224   No  results found for this basename: CKTOTAL:3,CKMB:3,CKMBINDEX:3,TROPONINI:3 in the last 72 hours No results found for this basename: TSH,T4TOTAL,FREET3,T3FREE,THYROIDAB in the last 72 hours No results found for this basename: VITAMINB12:2,FOLATE:2,FERRITIN:2,TIBC:2,IRON:2,RETICCTPCT:2 in the last 72 hours  Radiological Exams on Admission: Dg Chest 2 View  08/17/2011  *RADIOLOGY REPORT*  Clinical Data: And fever.  CHEST - 2 VIEW  Comparison: 03/16/2010  Findings: The lungs are clear without focal infiltrate, edema, pneumothorax or pleural effusion. The cardiopericardial silhouette is within normal limits for size. Interstitial markings are diffusely coarsened with chronic features. Imaged bony structures of the thorax are intact.  IMPRESSION: Mild chronic interstitial coarsening.  No acute cardiopulmonary process.  Original Report Authenticated By: ERIC A. MANSELL, M.D.   Ct Angio Chest W/cm &/or Wo Cm  08/18/2011  *RADIOLOGY REPORT*  Clinical Data:  Chest pain with cough.  History of clot  in the right knee.  CT ANGIOGRAPHY CHEST WITH CONTRAST  Technique:  Multidetector CT imaging of the chest was performed using the standard protocol during bolus administration of intravenous contrast.  Multiplanar CT image reconstructions including MIPs were obtained to evaluate the vascular anatomy.  Contrast: 80mL OMNIPAQUE IOHEXOL 300 MG/ML IV SOLN  Comparison:  10/13/2007  Findings:  Technically adequate study with relatively good opacification of the central and proximal segmental pulmonary arteries.  No focal filling defects.  No evidence of significant pulmonary embolus.  Diffuse bilateral hilar and mediastinal lymph node prominence is again demonstrated with enlarged left infrahilar node measuring about 2.6 x 1.5 cm.  This represents enlargement since the previous study.  Right hilar lymph nodes are also enlarged mildly in the interval.  Right paratracheal lymph node measures about 9 mm short axis dimension.   Axillary lymph nodes are not significantly enlarged.  To nodules in the right middle lung, each measuring about 5 mm diameter.  These are stable since the prior study.  Atelectasis in the lung bases.  Review of the MIP images confirms the above findings.  IMPRESSION: No evidence of significant pulmonary embolus.  Mediastinal and hilar lymphadenopathy, slightly increased since previous study. Right middle lobe nodules, stable.  Changes are consistent with history of sarcoidosis. Atelectasis in the lung bases.  Original Report Authenticated By: Marlon Pel, M.D.    Assessment/Plan Present on Admission:  .Fever .Flu .Chest pain .Sarcoid Probable influenza. Will treat symptomatically and with tamiflu.  Sava Proby 08/18/2011, 5:48 PM

## 2011-08-19 LAB — BASIC METABOLIC PANEL
BUN: 8 mg/dL (ref 6–23)
CO2: 26 mEq/L (ref 19–32)
Calcium: 8.8 mg/dL (ref 8.4–10.5)
Chloride: 104 mEq/L (ref 96–112)
Creatinine, Ser: 1.03 mg/dL (ref 0.50–1.35)
GFR calc Af Amer: >90 mL/min (ref >90)
GFR calc non Af Amer: 85 mL/min — ABNORMAL LOW (ref >90)
Glucose, Bld: 96 mg/dL (ref 70–99)
Potassium: 3.7 mEq/L (ref 3.5–5.1)
Sodium: 137 mEq/L (ref 135–145)

## 2011-08-19 LAB — CBC
HCT: 40.7 % (ref 39.0–52.0)
Hemoglobin: 13.3 g/dL (ref 13.0–17.0)
MCH: 28.9 pg (ref 26.0–34.0)
MCHC: 32.7 g/dL (ref 30.0–36.0)
MCV: 88.5 fL (ref 78.0–100.0)
Platelets: 199 10*3/uL (ref 150–400)
RBC: 4.6 MIL/uL (ref 4.22–5.81)
RDW: 14 % (ref 11.5–15.5)
WBC: 3.2 10*3/uL — ABNORMAL LOW (ref 4.0–10.5)

## 2011-08-19 MED ORDER — OXYCODONE-ACETAMINOPHEN 5-325 MG PO TABS: 1.0000 | ORAL_TABLET | Freq: Four times a day (QID) | ORAL | Status: AC | PRN

## 2011-08-19 MED ORDER — ALBUTEROL 90 MCG/ACT IN AERS: 2.0000 | INHALATION_SPRAY | Freq: Four times a day (QID) | RESPIRATORY_TRACT | Status: DC | PRN

## 2011-08-19 MED ORDER — IBUPROFEN 800 MG PO TABS: 800.0000 mg | ORAL_TABLET | Freq: Three times a day (TID) | ORAL | Status: AC | PRN

## 2011-08-19 MED ORDER — OSELTAMIVIR PHOSPHATE 75 MG PO CAPS: 75.0000 mg | ORAL_CAPSULE | Freq: Every day | ORAL | Status: AC

## 2011-08-20 NOTE — Discharge Summary (Signed)
Patient ID: [name] MRN: [id number] DOB/AGE: 46/21/1967 46 y.o. Primary Care Physician:KWIATKOWSKI,PETER Homero Fellers, MD Admit date: 08/17/2011 Discharge date: 08/20/2011    Discharge Diagnoses:  Influenza type A with severe pleuritic chest pain Sarcoidosis    Medication List  As of 08/20/2011  7:12 AM   START taking these medications         albuterol 90 MCG/ACT inhaler   Commonly known as: PROVENTIL,VENTOLIN   Inhale 2 puffs into the lungs every 6 (six) hours as needed for wheezing.      ibuprofen 800 MG tablet   Commonly known as: ADVIL,MOTRIN   Take 1 tablet (800 mg total) by mouth every 8 (eight) hours as needed for pain or fever.      oseltamivir 75 MG capsule   Commonly known as: TAMIFLU   Take 1 capsule (75 mg total) by mouth daily.      * oxyCODONE-acetaminophen 5-325 MG per tablet   Commonly known as: PERCOCET   Take 1 tablet by mouth every 6 (six) hours as needed (Use with Oxy IR 5mg  to make Percocet 10/325mg ).     * Notice: This list has 1 medication(s) that are the same as other medications prescribed for you. Read the directions carefully, and ask your doctor or other care provider to review them with you.       CONTINUE taking these medications         atenolol 50 MG tablet   Commonly known as: TENORMIN   TAKE 1 TABLET BY MOUTH TWICE DAILY      atorvastatin 40 MG tablet   Commonly known as: LIPITOR   TAKE ONE TABLET BY MOUTH DAILY      * azaTHIOprine 50 MG tablet   Commonly known as: IMURAN      * AZASAN 100 MG tablet   Generic drug: azathioprine   TAKE ONE TABLET BY MOUTH TWICE DAILY      buPROPion 300 MG 24 hr tablet   Commonly known as: WELLBUTRIN XL   TAKE ONE TABLET BY MOUTH DAILY      COBAL-1000 1000 MCG/ML injection   Generic drug: cyanocobalamin      cycloSPORINE 0.05 % ophthalmic emulsion   Commonly known as: RESTASIS      CYMBALTA 30 MG capsule   Generic drug: DULoxetine   TAKE ONE CAPSULE BY MOUTH DAILY      diazepam 5 MG  tablet   Commonly known as: VALIUM      dicyclomine 10 MG capsule   Commonly known as: BENTYL   TAKE 1 CAPSULE BY MOUTH 4 TIMES DAILY      esomeprazole 40 MG capsule   Commonly known as: NEXIUM      gabapentin 300 MG capsule   Commonly known as: NEURONTIN   TAKE 3 CAPSULES BY MOUTH FOUR TIMES DAILY      glucose blood test strip      oxybutynin 5 MG 24 hr tablet   Commonly known as: DITROPAN-XL      * oxyCODONE-acetaminophen 10-325 MG per tablet   Commonly known as: PERCOCET      potassium chloride 10 MEQ CR tablet   Commonly known as: KLOR-CON      predniSONE 5 MG tablet   Commonly known as: DELTASONE      promethazine 25 MG tablet   Commonly known as: PHENERGAN      Tamsulosin HCl 0.4 MG Caps   Commonly known as: FLOMAX   TAKE 1 CAPSULE BY MOUTH DAILY  traMADol 50 MG tablet   Commonly known as: ULTRAM   TAKE ONE TABLET BY MOUTH EVERY 6 HOURS AS NEEDED FOR PAIN     * Notice: This list has 3 medication(s) that are the same as other medications prescribed for you. Read the directions carefully, and ask your doctor or other care provider to review them with you.        Where to get your medications    These are the prescriptions that you need to pick up.   You may get these medications from any pharmacy.         albuterol 90 MCG/ACT inhaler   ibuprofen 800 MG tablet   oseltamivir 75 MG capsule   oxyCODONE-acetaminophen 5-325 MG per tablet            Discharged Condition: Good    Consults: None  Significant Diagnostic Studies: Dg Chest 2 View  08/17/2011  *RADIOLOGY REPORT*  Clinical Data: And fever.  CHEST - 2 VIEW  Comparison: 03/16/2010  Findings: The lungs are clear without focal infiltrate, edema, pneumothorax or pleural effusion. The cardiopericardial silhouette is within normal limits for size. Interstitial markings are diffusely coarsened with chronic features. Imaged bony structures of the thorax are intact.  IMPRESSION: Mild chronic  interstitial coarsening.  No acute cardiopulmonary process.  Original Report Authenticated By: ERIC A. MANSELL, M.D.   Ct Angio Chest W/cm &/or Wo Cm  08/18/2011  *RADIOLOGY REPORT*  Clinical Data:  Chest pain with cough.  History of clot in the right knee.  CT ANGIOGRAPHY CHEST WITH CONTRAST  Technique:  Multidetector CT imaging of the chest was performed using the standard protocol during bolus administration of intravenous contrast.  Multiplanar CT image reconstructions including MIPs were obtained to evaluate the vascular anatomy.  Contrast: 80mL OMNIPAQUE IOHEXOL 300 MG/ML IV SOLN  Comparison:  10/13/2007  Findings:  Technically adequate study with relatively good opacification of the central and proximal segmental pulmonary arteries.  No focal filling defects.  No evidence of significant pulmonary embolus.  Diffuse bilateral hilar and mediastinal lymph node prominence is again demonstrated with enlarged left infrahilar node measuring about 2.6 x 1.5 cm.  This represents enlargement since the previous study.  Right hilar lymph nodes are also enlarged mildly in the interval.  Right paratracheal lymph node measures about 9 mm short axis dimension.  Axillary lymph nodes are not significantly enlarged.  To nodules in the right middle lung, each measuring about 5 mm diameter.  These are stable since the prior study.  Atelectasis in the lung bases.  Review of the MIP images confirms the above findings.  IMPRESSION: No evidence of significant pulmonary embolus.  Mediastinal and hilar lymphadenopathy, slightly increased since previous study. Right middle lobe nodules, stable.  Changes are consistent with history of sarcoidosis. Atelectasis in the lung bases.  Original Report Authenticated By: Marlon Pel, M.D.    Lab Results: Results for orders placed during the hospital encounter of 08/17/11 (from the past 48 hour(s))  BASIC METABOLIC PANEL     Status: Abnormal   Collection Time   08/19/11  6:12 AM       Component Value Range Comment   Sodium 137  135 - 145 (mEq/L)    Potassium 3.7  3.5 - 5.1 (mEq/L)    Chloride 104  96 - 112 (mEq/L)    CO2 26  19 - 32 (mEq/L)    Glucose, Bld 96  70 - 99 (mg/dL)    BUN 8  6 -  23 (mg/dL)    Creatinine, Ser 4.54  0.50 - 1.35 (mg/dL)    Calcium 8.8  8.4 - 10.5 (mg/dL)    GFR calc non Af Amer 85 (*) >90 (mL/min)    GFR calc Af Amer >90  >90 (mL/min)   CBC     Status: Abnormal   Collection Time   08/19/11  6:12 AM      Component Value Range Comment   WBC 3.2 (*) 4.0 - 10.5 (K/uL)    RBC 4.60  4.22 - 5.81 (MIL/uL)    Hemoglobin 13.3  13.0 - 17.0 (g/dL)    HCT 09.8  11.9 - 14.7 (%)    MCV 88.5  78.0 - 100.0 (fL)    MCH 28.9  26.0 - 34.0 (pg)    MCHC 32.7  30.0 - 36.0 (g/dL)    RDW 82.9  56.2 - 13.0 (%)    Platelets 199  150 - 400 (K/uL)    No results found for this or any previous visit (from the past 240 hour(s)).   Hospital Course:  Mr. Kurt a 45 year old gentleman with known sarcoidosis was admitted to the hospital on August 18, 2011 for severe chest pain high fevers. He was diagnosed with influenza and was treated symptomatically. He was also started on Tamiflu. He was monitored for 24 hours without any signs of respiratory distress or sepsis. By hospital day #2 the patient's symptoms have improved and he was discharged home to continue his treatment for influenza and his house.  Discharge Exam: Blood pressure 110/70, pulse 81, temperature 100 F (37.8 C), temperature source Oral, resp. rate 20, height 5\' 10"  (1.778 m), weight 101.5 kg (223 lb 12.3 oz), SpO2 97.00%. Alert oriented x3 Chest clear to auscultation  Disposition: home  Discharge Orders    Future Appointments: Provider: Department: Dept Phone: Center:   02/08/2012 8:30 AM Rogelia Boga [facility] [phone]     Future Orders Please Complete By Expires   Diet - low sodium heart healthy      Diet general      Increase activity slowly          Follow-up Information    Follow up with Rogelia Boga. Make an appointment in 1 week.         Signed: Miarose Lippert 08/20/2011, 7:13 AM

## 2011-08-24 NOTE — Progress Notes (Signed)
Quick Note:  Pt aware ______ 

## 2011-09-06 ENCOUNTER — Other Ambulatory Visit: Payer: Self-pay | Admitting: Internal Medicine

## 2011-09-12 ENCOUNTER — Other Ambulatory Visit: Payer: Self-pay | Admitting: Internal Medicine

## 2011-09-15 DIAGNOSIS — E669 Obesity, unspecified: Secondary | ICD-10-CM | POA: Diagnosis not present

## 2011-09-15 DIAGNOSIS — Z79899 Other long term (current) drug therapy: Secondary | ICD-10-CM | POA: Diagnosis not present

## 2011-09-15 DIAGNOSIS — I119 Hypertensive heart disease without heart failure: Secondary | ICD-10-CM | POA: Diagnosis not present

## 2011-09-15 DIAGNOSIS — D869 Sarcoidosis, unspecified: Secondary | ICD-10-CM | POA: Diagnosis not present

## 2011-09-15 DIAGNOSIS — F329 Major depressive disorder, single episode, unspecified: Secondary | ICD-10-CM | POA: Diagnosis not present

## 2011-09-15 DIAGNOSIS — N281 Cyst of kidney, acquired: Secondary | ICD-10-CM | POA: Diagnosis not present

## 2011-09-15 DIAGNOSIS — I517 Cardiomegaly: Secondary | ICD-10-CM | POA: Diagnosis not present

## 2011-09-15 DIAGNOSIS — I498 Other specified cardiac arrhythmias: Secondary | ICD-10-CM | POA: Diagnosis not present

## 2011-09-15 DIAGNOSIS — I43 Cardiomyopathy in diseases classified elsewhere: Secondary | ICD-10-CM | POA: Diagnosis not present

## 2011-09-15 DIAGNOSIS — G4733 Obstructive sleep apnea (adult) (pediatric): Secondary | ICD-10-CM | POA: Diagnosis not present

## 2011-09-15 DIAGNOSIS — K219 Gastro-esophageal reflux disease without esophagitis: Secondary | ICD-10-CM | POA: Diagnosis not present

## 2011-09-15 DIAGNOSIS — I10 Essential (primary) hypertension: Secondary | ICD-10-CM | POA: Diagnosis not present

## 2011-09-15 DIAGNOSIS — E785 Hyperlipidemia, unspecified: Secondary | ICD-10-CM | POA: Diagnosis not present

## 2011-09-15 DIAGNOSIS — I2510 Atherosclerotic heart disease of native coronary artery without angina pectoris: Secondary | ICD-10-CM | POA: Diagnosis not present

## 2011-09-21 ENCOUNTER — Other Ambulatory Visit: Payer: Self-pay | Admitting: Internal Medicine

## 2011-09-22 DIAGNOSIS — M722 Plantar fascial fibromatosis: Secondary | ICD-10-CM | POA: Diagnosis not present

## 2011-09-22 DIAGNOSIS — S93409A Sprain of unspecified ligament of unspecified ankle, initial encounter: Secondary | ICD-10-CM | POA: Diagnosis not present

## 2011-10-07 DIAGNOSIS — F329 Major depressive disorder, single episode, unspecified: Secondary | ICD-10-CM | POA: Diagnosis not present

## 2011-10-07 DIAGNOSIS — I10 Essential (primary) hypertension: Secondary | ICD-10-CM | POA: Diagnosis not present

## 2011-10-07 DIAGNOSIS — I951 Orthostatic hypotension: Secondary | ICD-10-CM | POA: Diagnosis not present

## 2011-10-07 DIAGNOSIS — J111 Influenza due to unidentified influenza virus with other respiratory manifestations: Secondary | ICD-10-CM | POA: Diagnosis not present

## 2011-10-07 DIAGNOSIS — E785 Hyperlipidemia, unspecified: Secondary | ICD-10-CM | POA: Insufficient documentation

## 2011-10-07 DIAGNOSIS — D869 Sarcoidosis, unspecified: Secondary | ICD-10-CM | POA: Diagnosis not present

## 2011-10-21 ENCOUNTER — Ambulatory Visit (INDEPENDENT_AMBULATORY_CARE_PROVIDER_SITE_OTHER): Payer: Medicare Other | Admitting: Internal Medicine

## 2011-10-21 ENCOUNTER — Encounter: Payer: Self-pay | Admitting: Internal Medicine

## 2011-10-21 DIAGNOSIS — I428 Other cardiomyopathies: Secondary | ICD-10-CM

## 2011-10-21 DIAGNOSIS — R7302 Impaired glucose tolerance (oral): Secondary | ICD-10-CM

## 2011-10-21 DIAGNOSIS — I10 Essential (primary) hypertension: Secondary | ICD-10-CM | POA: Diagnosis not present

## 2011-10-21 DIAGNOSIS — R7309 Other abnormal glucose: Secondary | ICD-10-CM

## 2011-10-21 DIAGNOSIS — D869 Sarcoidosis, unspecified: Secondary | ICD-10-CM | POA: Diagnosis not present

## 2011-10-21 DIAGNOSIS — E7800 Pure hypercholesterolemia, unspecified: Secondary | ICD-10-CM

## 2011-10-21 DIAGNOSIS — E538 Deficiency of other specified B group vitamins: Secondary | ICD-10-CM | POA: Diagnosis not present

## 2011-10-21 DIAGNOSIS — R972 Elevated prostate specific antigen [PSA]: Secondary | ICD-10-CM | POA: Diagnosis not present

## 2011-10-21 LAB — PSA: PSA: 0.03 ng/mL — ABNORMAL LOW (ref 0.10–4.00)

## 2011-10-21 LAB — HEMOGLOBIN A1C: Hgb A1c MFr Bld: 5.4 % (ref 4.6–6.5)

## 2011-10-21 MED ORDER — ATENOLOL 25 MG PO TABS: ORAL_TABLET | ORAL | Status: DC

## 2011-10-21 NOTE — Patient Instructions (Signed)
Limit your sodium (Salt) intake    It is important that you exercise regularly, at least 20 minutes 3 to 4 times per week.  If you develop chest pain or shortness of breath seek  medical attention.  Return in 3 months for follow-up  

## 2011-10-21 NOTE — Progress Notes (Signed)
  Subjective:    Patient ID: [name], male    DOB: 05/27/1966, 46 y.o.   MRN: [id number]  HPI  46 year old patient who is seen today for followup. He has multiple medical problems including hypertension he has been on atenolol 50 mg twice a day he has a history of hypertrophic heart myopathy as well as palpitations and chest pain. For the past several days he has had orthostatic weakness this is especially bothersome at night when he uses the bathroom to void. Denies any syncope He was hospitalized in December and was noted to have pulmonary sarcoid on chest CT evaluation. He has done remarkably well and has lost weight and feels in general much better. He does have B12 deficiency history of glucose intolerance and also dyslipidemia    Review of Systems  Constitutional: Positive for fatigue. Negative for fever, chills and appetite change.  HENT: Negative for hearing loss, ear pain, congestion, sore throat, trouble swallowing, neck stiffness, dental problem, voice change and tinnitus.   Eyes: Negative for pain, discharge and visual disturbance.  Respiratory: Negative for cough, chest tightness, wheezing and stridor.   Cardiovascular: Negative for chest pain, palpitations and leg swelling.  Gastrointestinal: Negative for nausea, vomiting, abdominal pain, diarrhea, constipation, blood in stool and abdominal distention.  Genitourinary: Negative for urgency, hematuria, flank pain, discharge, difficulty urinating and genital sores.  Musculoskeletal: Negative for myalgias, back pain, joint swelling, arthralgias and gait problem.  Skin: Negative for rash.  Neurological: Positive for light-headedness. Negative for dizziness, syncope, speech difficulty, weakness, numbness and headaches.  Hematological: Negative for adenopathy. Does not bruise/bleed easily.  Psychiatric/Behavioral: Negative for behavioral problems and dysphoric mood. The patient is not nervous/anxious.        Objective:   Physical Exam  Constitutional: He is oriented to person, place, and time. He appears well-developed.       108/70  HENT:  Head: Normocephalic.  Right Ear: External ear normal.  Left Ear: External ear normal.  Eyes: Conjunctivae and EOM are normal.  Neck: Normal range of motion.  Cardiovascular: Normal rate and normal heart sounds.   Pulmonary/Chest: Effort normal and breath sounds normal. No respiratory distress. He has no rales.  Abdominal: Bowel sounds are normal.  Musculoskeletal: Normal range of motion. He exhibits no edema and no tenderness.  Neurological: He is alert and oriented to person, place, and time.  Psychiatric: He has a normal mood and affect. His behavior is normal.          Assessment & Plan:   Hypertension. We'll decrease atenolol to 25 mg twice a day Hypertrophic cardiomyopathy Impaired glucose tolerance Palpitations History of chest pain stable  Low salt diet  recommended Recheck 3 months. We'll check a hemoglobin A1c at that time

## 2011-10-26 DIAGNOSIS — N2889 Other specified disorders of kidney and ureter: Secondary | ICD-10-CM | POA: Insufficient documentation

## 2011-10-26 DIAGNOSIS — N289 Disorder of kidney and ureter, unspecified: Secondary | ICD-10-CM | POA: Diagnosis not present

## 2011-10-26 DIAGNOSIS — C61 Malignant neoplasm of prostate: Secondary | ICD-10-CM | POA: Diagnosis not present

## 2011-10-26 DIAGNOSIS — N401 Enlarged prostate with lower urinary tract symptoms: Secondary | ICD-10-CM | POA: Insufficient documentation

## 2011-10-26 DIAGNOSIS — N138 Other obstructive and reflux uropathy: Secondary | ICD-10-CM | POA: Diagnosis not present

## 2011-10-26 DIAGNOSIS — N529 Male erectile dysfunction, unspecified: Secondary | ICD-10-CM | POA: Diagnosis not present

## 2011-11-23 DIAGNOSIS — R0602 Shortness of breath: Secondary | ICD-10-CM | POA: Diagnosis not present

## 2011-11-23 DIAGNOSIS — G4733 Obstructive sleep apnea (adult) (pediatric): Secondary | ICD-10-CM | POA: Insufficient documentation

## 2011-11-23 DIAGNOSIS — D869 Sarcoidosis, unspecified: Secondary | ICD-10-CM | POA: Diagnosis not present

## 2011-11-23 DIAGNOSIS — R5381 Other malaise: Secondary | ICD-10-CM | POA: Diagnosis not present

## 2011-11-23 DIAGNOSIS — F329 Major depressive disorder, single episode, unspecified: Secondary | ICD-10-CM | POA: Diagnosis not present

## 2011-11-23 DIAGNOSIS — R5383 Other fatigue: Secondary | ICD-10-CM | POA: Insufficient documentation

## 2011-11-25 DIAGNOSIS — R0602 Shortness of breath: Secondary | ICD-10-CM | POA: Insufficient documentation

## 2011-12-10 ENCOUNTER — Other Ambulatory Visit: Payer: Self-pay

## 2011-12-10 NOTE — Telephone Encounter (Signed)
Notify patient  That  Dr. Norma Fredrickson should refill this medication

## 2011-12-10 NOTE — Telephone Encounter (Signed)
Fax refill request from [facility] [employer] for prednisone 5mg  qd This has never been written by you that I can see in the medications past or present record  It was written fro # 90 09/02/10 last filled 07/24/2011 written by. Dr. Harlen Labs  Please advise

## 2011-12-10 NOTE — Telephone Encounter (Signed)
Faxed denial back to [employer] - will need tobe seen - never rx'd by this office

## 2011-12-23 DIAGNOSIS — D869 Sarcoidosis, unspecified: Secondary | ICD-10-CM | POA: Diagnosis not present

## 2011-12-29 ENCOUNTER — Other Ambulatory Visit: Payer: Self-pay

## 2011-12-29 ENCOUNTER — Inpatient Hospital Stay (HOSPITAL_COMMUNITY)
Admission: EM | Admit: 2011-12-29 | Discharge: 2011-12-30 | DRG: 204 | Disposition: A | Discharge status: Home / Self Care | Payer: Medicare Other | Attending: Internal Medicine | Admitting: Internal Medicine

## 2011-12-29 ENCOUNTER — Encounter (HOSPITAL_COMMUNITY): Payer: Self-pay | Admitting: *Deleted

## 2011-12-29 ENCOUNTER — Emergency Department (HOSPITAL_COMMUNITY): Payer: Medicare Other

## 2011-12-29 DIAGNOSIS — R7309 Other abnormal glucose: Secondary | ICD-10-CM | POA: Diagnosis present

## 2011-12-29 DIAGNOSIS — R509 Fever, unspecified: Secondary | ICD-10-CM

## 2011-12-29 DIAGNOSIS — R071 Chest pain on breathing: Principal | ICD-10-CM | POA: Diagnosis present

## 2011-12-29 DIAGNOSIS — F329 Major depressive disorder, single episode, unspecified: Secondary | ICD-10-CM | POA: Diagnosis not present

## 2011-12-29 DIAGNOSIS — L720 Epidermal cyst: Secondary | ICD-10-CM

## 2011-12-29 DIAGNOSIS — I428 Other cardiomyopathies: Secondary | ICD-10-CM

## 2011-12-29 DIAGNOSIS — D869 Sarcoidosis, unspecified: Secondary | ICD-10-CM | POA: Diagnosis not present

## 2011-12-29 DIAGNOSIS — G894 Chronic pain syndrome: Secondary | ICD-10-CM | POA: Diagnosis not present

## 2011-12-29 DIAGNOSIS — R002 Palpitations: Secondary | ICD-10-CM | POA: Diagnosis present

## 2011-12-29 DIAGNOSIS — E538 Deficiency of other specified B group vitamins: Secondary | ICD-10-CM | POA: Diagnosis present

## 2011-12-29 DIAGNOSIS — R079 Chest pain, unspecified: Secondary | ICD-10-CM | POA: Diagnosis present

## 2011-12-29 DIAGNOSIS — M1990 Unspecified osteoarthritis, unspecified site: Secondary | ICD-10-CM | POA: Diagnosis present

## 2011-12-29 DIAGNOSIS — I421 Obstructive hypertrophic cardiomyopathy: Secondary | ICD-10-CM | POA: Diagnosis present

## 2011-12-29 DIAGNOSIS — F3289 Other specified depressive episodes: Secondary | ICD-10-CM | POA: Diagnosis not present

## 2011-12-29 DIAGNOSIS — E739 Lactose intolerance, unspecified: Secondary | ICD-10-CM | POA: Diagnosis present

## 2011-12-29 DIAGNOSIS — Z87891 Personal history of nicotine dependence: Secondary | ICD-10-CM | POA: Diagnosis not present

## 2011-12-29 DIAGNOSIS — G4730 Sleep apnea, unspecified: Secondary | ICD-10-CM

## 2011-12-29 DIAGNOSIS — E785 Hyperlipidemia, unspecified: Secondary | ICD-10-CM

## 2011-12-29 DIAGNOSIS — Z87442 Personal history of urinary calculi: Secondary | ICD-10-CM

## 2011-12-29 DIAGNOSIS — E669 Obesity, unspecified: Secondary | ICD-10-CM | POA: Diagnosis present

## 2011-12-29 DIAGNOSIS — R972 Elevated prostate specific antigen [PSA]: Secondary | ICD-10-CM

## 2011-12-29 DIAGNOSIS — E7800 Pure hypercholesterolemia, unspecified: Secondary | ICD-10-CM | POA: Diagnosis present

## 2011-12-29 DIAGNOSIS — I10 Essential (primary) hypertension: Secondary | ICD-10-CM | POA: Diagnosis present

## 2011-12-29 DIAGNOSIS — J111 Influenza due to unidentified influenza virus with other respiratory manifestations: Secondary | ICD-10-CM

## 2011-12-29 DIAGNOSIS — K219 Gastro-esophageal reflux disease without esophagitis: Secondary | ICD-10-CM

## 2011-12-29 DIAGNOSIS — I517 Cardiomegaly: Secondary | ICD-10-CM | POA: Diagnosis not present

## 2011-12-29 HISTORY — DX: Malignant (primary) neoplasm, unspecified: C80.1

## 2011-12-29 HISTORY — DX: Shortness of breath: R06.02

## 2011-12-29 LAB — LIPID PANEL
Cholesterol: 130 mg/dL (ref 0–200)
HDL: 56 mg/dL (ref >39)
LDL Cholesterol: 66 mg/dL (ref 0–99)
Total CHOL/HDL Ratio: 2.3 RATIO
Triglycerides: 38 mg/dL (ref <150)
VLDL: 8 mg/dL (ref 0–40)

## 2011-12-29 LAB — BASIC METABOLIC PANEL WITH GFR
BUN: 12 mg/dL (ref 6–23)
CO2: 24 meq/L (ref 19–32)
Calcium: 9.3 mg/dL (ref 8.4–10.5)
Chloride: 104 meq/L (ref 96–112)
Creatinine, Ser: 1.01 mg/dL (ref 0.50–1.35)
GFR calc Af Amer: >90 mL/min
GFR calc non Af Amer: 87 mL/min — ABNORMAL LOW
Glucose, Bld: 92 mg/dL (ref 70–99)
Potassium: 3.8 meq/L (ref 3.5–5.1)
Sodium: 139 meq/L (ref 135–145)

## 2011-12-29 LAB — HEMOGLOBIN A1C
Hgb A1c MFr Bld: 5.5 % (ref <5.7)
Mean Plasma Glucose: 111 mg/dL (ref <117)

## 2011-12-29 LAB — CBC
HCT: 41.4 % (ref 39.0–52.0)
Hemoglobin: 14 g/dL (ref 13.0–17.0)
MCH: 29.4 pg (ref 26.0–34.0)
MCHC: 33.8 g/dL (ref 30.0–36.0)
MCV: 86.8 fL (ref 78.0–100.0)
Platelets: 278 10*3/uL (ref 150–400)
RBC: 4.77 MIL/uL (ref 4.22–5.81)
RDW: 13.6 % (ref 11.5–15.5)
WBC: 4.1 10*3/uL (ref 4.0–10.5)

## 2011-12-29 LAB — DIFFERENTIAL
Basophils Absolute: 0 10*3/uL (ref 0.0–0.1)
Basophils Relative: 1 % (ref 0–1)
Eosinophils Absolute: 0.1 10*3/uL (ref 0.0–0.7)
Eosinophils Relative: 3 % (ref 0–5)
Lymphocytes Relative: 30 % (ref 12–46)
Lymphs Abs: 1.2 10*3/uL (ref 0.7–4.0)
Monocytes Absolute: 0.7 10*3/uL (ref 0.1–1.0)
Monocytes Relative: 18 % — ABNORMAL HIGH (ref 3–12)
Neutro Abs: 2 10*3/uL (ref 1.7–7.7)
Neutrophils Relative %: 49 % (ref 43–77)

## 2011-12-29 LAB — CK TOTAL AND CKMB (NOT AT ARMC)
CK, MB: 3 ng/mL (ref 0.3–4.0)
Relative Index: 2.1 (ref 0.0–2.5)
Total CK: 146 U/L (ref 7–232)

## 2011-12-29 LAB — MRSA PCR SCREENING: MRSA by PCR: NEGATIVE

## 2011-12-29 LAB — TROPONIN I: Troponin I: <0.3 ng/mL

## 2011-12-29 LAB — CARDIAC PANEL(CRET KIN+CKTOT+MB+TROPI)
CK, MB: 2.8 ng/mL (ref 0.3–4.0)
Relative Index: 2.2 (ref 0.0–2.5)
Total CK: 125 U/L (ref 7–232)
Troponin I: <0.3 ng/mL (ref <0.30)

## 2011-12-29 MED ORDER — POLYVINYL ALCOHOL 1.4 % OP SOLN
1.0000 [drp] | Freq: Every day | OPHTHALMIC | Status: DC
  Administered 2011-12-29: 1 [drp] via OPHTHALMIC
  Filled 2011-12-29: qty 15

## 2011-12-29 MED ORDER — ASPIRIN 325 MG PO TABS
325.0000 mg | ORAL_TABLET | Freq: Every day | ORAL | Status: DC
  Administered 2011-12-29 – 2011-12-30 (×2): 325 mg via ORAL
  Filled 2011-12-29 (×2): qty 1

## 2011-12-29 MED ORDER — SODIUM CHLORIDE 0.9 % IV SOLN
INTRAVENOUS | Status: DC
  Administered 2011-12-29: 125 mL/h via INTRAVENOUS
  Administered 2011-12-29 – 2011-12-30 (×4): via INTRAVENOUS

## 2011-12-29 MED ORDER — DIAZEPAM 5 MG PO TABS
5.0000 mg | ORAL_TABLET | Freq: Two times a day (BID) | ORAL | Status: DC | PRN
Start: 2011-12-29 — End: 2011-12-30

## 2011-12-29 MED ORDER — METOPROLOL TARTRATE 25 MG PO TABS
25.0000 mg | ORAL_TABLET | Freq: Two times a day (BID) | ORAL | Status: DC
  Administered 2011-12-29 – 2011-12-30 (×3): 25 mg via ORAL
  Filled 2011-12-29 (×4): qty 1

## 2011-12-29 MED ORDER — OXYCODONE-ACETAMINOPHEN 5-325 MG PO TABS
1.0000 | ORAL_TABLET | Freq: Four times a day (QID) | ORAL | Status: DC | PRN
  Administered 2011-12-30 (×2): 1 via ORAL
  Filled 2011-12-29 (×2): qty 1

## 2011-12-29 MED ORDER — PANTOPRAZOLE SODIUM 40 MG PO TBEC
40.0000 mg | DELAYED_RELEASE_TABLET | Freq: Every day | ORAL | Status: DC
  Administered 2011-12-29 – 2011-12-30 (×2): 40 mg via ORAL
  Filled 2011-12-29 (×2): qty 1

## 2011-12-29 MED ORDER — SODIUM CHLORIDE 0.9 % IJ SOLN: 3.0000 mL | INTRAMUSCULAR | Status: DC | PRN

## 2011-12-29 MED ORDER — OXYCODONE-ACETAMINOPHEN 10-325 MG PO TABS: 1.0000 | ORAL_TABLET | Freq: Four times a day (QID) | ORAL | Status: DC | PRN

## 2011-12-29 MED ORDER — ENOXAPARIN SODIUM 40 MG/0.4ML ~~LOC~~ SOLN
40.0000 mg | SUBCUTANEOUS | Status: DC
  Administered 2011-12-29 – 2011-12-30 (×2): 40 mg via SUBCUTANEOUS
  Filled 2011-12-29 (×2): qty 0.4

## 2011-12-29 MED ORDER — ARTIFICIAL TEAR OP GEL: 1.0000 [drp] | Freq: Every day | OPHTHALMIC | Status: DC

## 2011-12-29 MED ORDER — GUAIFENESIN ER 600 MG PO TB12
1200.0000 mg | ORAL_TABLET | Freq: Two times a day (BID) | ORAL | Status: DC
  Administered 2011-12-29 – 2011-12-30 (×2): 1200 mg via ORAL
  Filled 2011-12-29 (×3): qty 2

## 2011-12-29 MED ORDER — PREDNISONE 10 MG PO TABS
10.0000 mg | ORAL_TABLET | Freq: Every day | ORAL | Status: DC
  Administered 2011-12-30: 10 mg via ORAL
  Filled 2011-12-29 (×2): qty 1

## 2011-12-29 MED ORDER — CYCLOSPORINE 0.05 % OP EMUL
1.0000 [drp] | Freq: Two times a day (BID) | OPHTHALMIC | Status: DC
  Administered 2011-12-29 – 2011-12-30 (×3): 1 [drp] via OPHTHALMIC
  Filled 2011-12-29 (×4): qty 1

## 2011-12-29 MED ORDER — SODIUM CHLORIDE 0.9 % IV BOLUS (SEPSIS)
500.0000 mL | Freq: Once | INTRAVENOUS | Status: AC
  Administered 2011-12-29: 500 mL via INTRAVENOUS

## 2011-12-29 MED ORDER — GABAPENTIN 300 MG PO CAPS
300.0000 mg | ORAL_CAPSULE | Freq: Four times a day (QID) | ORAL | Status: DC
  Administered 2011-12-29 – 2011-12-30 (×5): 300 mg via ORAL
  Filled 2011-12-29 (×6): qty 1

## 2011-12-29 MED ORDER — DULOXETINE HCL 60 MG PO CPEP
60.0000 mg | ORAL_CAPSULE | Freq: Every day | ORAL | Status: DC
  Administered 2011-12-29 – 2011-12-30 (×2): 60 mg via ORAL
  Filled 2011-12-29 (×2): qty 1

## 2011-12-29 MED ORDER — NITROGLYCERIN IN D5W 200-5 MCG/ML-% IV SOLN
5.0000 ug/min | INTRAVENOUS | Status: DC
  Administered 2011-12-29: 5 ug/min via INTRAVENOUS
  Filled 2011-12-29: qty 250

## 2011-12-29 MED ORDER — OXYCODONE HCL 5 MG PO TABS
5.0000 mg | ORAL_TABLET | Freq: Four times a day (QID) | ORAL | Status: DC | PRN
  Administered 2011-12-30: 5 mg via ORAL
  Filled 2011-12-29: qty 1

## 2011-12-29 MED ORDER — PNEUMOCOCCAL VAC POLYVALENT 25 MCG/0.5ML IJ INJ
0.5000 mL | INJECTION | INTRAMUSCULAR | Status: AC
  Administered 2011-12-29: 0.5 mL via INTRAMUSCULAR
  Filled 2011-12-29 (×2): qty 0.5

## 2011-12-29 MED ORDER — SODIUM CHLORIDE 0.9 % IV SOLN: 250.0000 mL | INTRAVENOUS | Status: DC | PRN

## 2011-12-29 MED ORDER — OXYBUTYNIN CHLORIDE 5 MG PO TABS
5.0000 mg | ORAL_TABLET | Freq: Every day | ORAL | Status: DC
  Administered 2011-12-29: 5 mg via ORAL
  Filled 2011-12-29 (×2): qty 1

## 2011-12-29 MED ORDER — SODIUM CHLORIDE 0.9 % IJ SOLN
3.0000 mL | Freq: Two times a day (BID) | INTRAMUSCULAR | Status: DC
  Administered 2011-12-29: 3 mL via INTRAVENOUS

## 2011-12-29 MED ORDER — BUPROPION HCL ER (XL) 300 MG PO TB24
300.0000 mg | ORAL_TABLET | Freq: Every day | ORAL | Status: DC
  Administered 2011-12-29 – 2011-12-30 (×2): 300 mg via ORAL
  Filled 2011-12-29 (×2): qty 1

## 2011-12-29 MED ORDER — ALBUTEROL 90 MCG/ACT IN AERS: 2.0000 | INHALATION_SPRAY | Freq: Four times a day (QID) | RESPIRATORY_TRACT | Status: DC | PRN

## 2011-12-29 MED ORDER — MORPHINE SULFATE 4 MG/ML IJ SOLN
4.0000 mg | Freq: Once | INTRAMUSCULAR | Status: AC
  Administered 2011-12-29: 4 mg via INTRAVENOUS
  Filled 2011-12-29: qty 1

## 2011-12-29 MED ORDER — ATORVASTATIN CALCIUM 40 MG PO TABS
40.0000 mg | ORAL_TABLET | Freq: Every day | ORAL | Status: DC
  Administered 2011-12-29 – 2011-12-30 (×2): 40 mg via ORAL
  Filled 2011-12-29 (×2): qty 1

## 2011-12-29 MED ORDER — PROMETHAZINE HCL 25 MG PO TABS
25.0000 mg | ORAL_TABLET | Freq: Four times a day (QID) | ORAL | Status: DC | PRN
  Administered 2011-12-29 – 2011-12-30 (×3): 25 mg via ORAL
  Filled 2011-12-29 (×3): qty 1

## 2011-12-29 MED ORDER — ONDANSETRON HCL 4 MG/2ML IJ SOLN
4.0000 mg | Freq: Once | INTRAMUSCULAR | Status: AC
  Administered 2011-12-29: 4 mg via INTRAVENOUS

## 2011-12-29 MED ORDER — MORPHINE SULFATE 2 MG/ML IJ SOLN
2.0000 mg | INTRAMUSCULAR | Status: DC | PRN
  Administered 2011-12-29 – 2011-12-30 (×2): 2 mg via INTRAVENOUS
  Filled 2011-12-29 (×2): qty 1

## 2011-12-29 MED ORDER — OXYBUTYNIN CHLORIDE ER 5 MG PO TB24: 5.0000 mg | ORAL_TABLET | Freq: Two times a day (BID) | ORAL | Status: DC

## 2011-12-29 MED ORDER — AZATHIOPRINE 50 MG PO TABS
100.0000 mg | ORAL_TABLET | Freq: Two times a day (BID) | ORAL | Status: DC
  Administered 2011-12-29 – 2011-12-30 (×2): 100 mg via ORAL
  Filled 2011-12-29 (×4): qty 2

## 2011-12-29 MED ORDER — AZATHIOPRINE 100 MG PO TABS: 100.0000 mg | ORAL_TABLET | Freq: Two times a day (BID) | ORAL | Status: DC

## 2011-12-29 MED ORDER — ASPIRIN EC 81 MG PO TBEC: 81.0000 mg | DELAYED_RELEASE_TABLET | Freq: Every day | ORAL | Status: DC

## 2011-12-29 MED ORDER — ALBUTEROL SULFATE HFA 108 (90 BASE) MCG/ACT IN AERS: 2.0000 | INHALATION_SPRAY | Freq: Four times a day (QID) | RESPIRATORY_TRACT | Status: DC | PRN

## 2011-12-29 MED ORDER — POTASSIUM CHLORIDE CRYS ER 20 MEQ PO TBCR
20.0000 meq | EXTENDED_RELEASE_TABLET | Freq: Every day | ORAL | Status: DC
  Administered 2011-12-29 – 2011-12-30 (×2): 20 meq via ORAL
  Filled 2011-12-29 (×2): qty 1

## 2011-12-29 MED ORDER — DICYCLOMINE HCL 10 MG PO CAPS
10.0000 mg | ORAL_CAPSULE | Freq: Three times a day (TID) | ORAL | Status: DC
  Administered 2011-12-29 – 2011-12-30 (×5): 10 mg via ORAL
  Filled 2011-12-29 (×7): qty 1

## 2011-12-29 MED ORDER — ONDANSETRON HCL 4 MG/2ML IJ SOLN
4.0000 mg | Freq: Once | INTRAMUSCULAR | Status: AC
  Administered 2011-12-29: 4 mg via INTRAVENOUS
  Filled 2011-12-29: qty 2

## 2011-12-29 MED ORDER — ONDANSETRON HCL 4 MG/2ML IJ SOLN
INTRAMUSCULAR | Status: AC
  Administered 2011-12-29: 4 mg via INTRAVENOUS
  Filled 2011-12-29: qty 2

## 2011-12-29 MED ORDER — CYANOCOBALAMIN 1000 MCG/ML IJ SOLN
1000.0000 ug | INTRAMUSCULAR | Status: DC
  Filled 2011-12-29: qty 1

## 2011-12-29 MED ORDER — NITROGLYCERIN 0.4 MG SL SUBL
0.4000 mg | SUBLINGUAL_TABLET | SUBLINGUAL | Status: DC | PRN
  Administered 2011-12-29 (×2): 0.4 mg via SUBLINGUAL
  Filled 2011-12-29: qty 25

## 2011-12-29 NOTE — ED Notes (Signed)
Spoke with physician concerning pt condition after administering NTG x 1 and BP 99/73.  Order received for NS bolus 500 ml. To determine next step after bolus completed.

## 2011-12-29 NOTE — ED Notes (Signed)
Attempted to call for report for patient with intermittent chest pain. Nurse on floor unable to take report at this time because pt's pain is not completely gone.

## 2011-12-29 NOTE — ED Notes (Signed)
Hospitalist at bedside 

## 2011-12-29 NOTE — ED Notes (Signed)
2925-01 Ready 

## 2011-12-29 NOTE — ED Notes (Signed)
Spoke to Dr. Gonzella Lex concerning patient status for 3700 for third time. Pt reports pain at 3/10 in chest and constant pain in neck and left arm. Physician to change bed to stepdown.

## 2011-12-29 NOTE — ED Notes (Signed)
Floor unable to take report at this time. Room just assigned. This RN to call back in five minutes.

## 2011-12-29 NOTE — ED Provider Notes (Signed)
History     CSN: [id number]  Arrival date & time 12/29/11  7829   First MD Initiated Contact with Patient 12/29/11 0701      Chief Complaint  Patient presents with  . Chest Pain    (Consider location/radiation/quality/duration/timing/severity/associated sxs/prior treatment) Patient is a 46 y.o. male presenting with chest pain. The history is provided by the patient.  Chest Pain Primary symptoms include nausea. Pertinent negatives for primary symptoms include no shortness of breath, no abdominal pain and no vomiting.  Pertinent negatives for associated symptoms include no numbness and no weakness.    patient woke with left-sided chest pain today. He states that there is a sharp pain in his left chest, but there is also a dull pain in his left neck left chest and left arm. It is constant. It has not changed since he awoke. No fevers. No cough. He is a history of sarcoidosis. He has some nauseousness no vomiting. No fevers. No cough. He's not had pain over. He has not eaten today. No vomiting.  Past Medical History  Diagnosis Date  . B12 DEFICIENCY 03/06/2007  . CARDIOMYOPATHY, IDIOPATHIC HYPERTROPHIC 03/06/2007  . DEPRESSION 03/06/2007  . GERD 06/01/2007  . HYPERCHOLESTEROLEMIA 03/06/2007  . HYPERLIPIDEMIA 11/08/2008  . HYPERTENSION 03/06/2007  . IMPAIRED GLUCOSE TOLERANCE 11/08/2008  . NEPHROLITHIASIS, HX OF 04/07/2010  . 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 OSTEOARTHRITIS 06/01/2007  . Palpitations 10/23/2007  . SLEEP APNEA 03/06/2007  . SYNDROME, CHRONIC PAIN 03/06/2007    Past Surgical History  Procedure Date  . Decompression and fusion     cervicothoracic  . Partial nephrectomy   . Cardiac catheterization   . Lumbar fusion   . Prostate surgery     robotic prostatectomy    Family History  Problem Relation Age of Onset  . Heart disease Neg Hx     family hx CHF  . Kidney disease Neg Hx     family hx   . Cancer Neg Hx     family hx prostate    History  Substance Use Topics  .  Smoking status: Former Smoker    Quit date: 09/07/1983  . Smokeless tobacco: Never Used  . Alcohol Use: No      Review of Systems  Constitutional: Negative for activity change and appetite change.  HENT: Negative for neck stiffness.   Eyes: Negative for pain.  Respiratory: Negative for chest tightness and shortness of breath.   Cardiovascular: Positive for chest pain. Negative for leg swelling.  Gastrointestinal: Positive for nausea. Negative for vomiting, abdominal pain and diarrhea.  Genitourinary: Negative for flank pain.  Musculoskeletal: Negative for back pain.  Skin: Negative for rash.  Neurological: Negative for weakness, numbness and headaches.  Psychiatric/Behavioral: Negative for behavioral problems.    Allergies  Review of patient's allergies indicates no known allergies.  Home Medications   Current Outpatient Rx  Name Route Sig Dispense Refill  . ALBUTEROL 90 MCG/ACT IN AERS Inhalation Inhale 2 puffs into the lungs every 6 (six) hours as needed for wheezing. 17 g 12  . ARTIFICIAL TEAR OP GEL Ophthalmic Apply 1 drop to eye at bedtime. For dry eye    . ATENOLOL 25 MG PO TABS  1 tablet twice daily 180 tablet 3  . ATORVASTATIN CALCIUM 40 MG PO TABS  TAKE ONE TABLET BY MOUTH DAILY 90 tablet 3    CYCLE FILL MEDICATION. Authorization is required f ...  . AZASAN 100 MG PO TABS  TAKE ONE TABLET BY MOUTH TWICE DAILY  180 tablet 1    CYCLE FILL MEDICATION. Authorization is required f ...  . AZATHIOPRINE 50 MG PO TABS Oral Take 50 mg by mouth 2 (two) times daily.      . BUPROPION HCL ER (XL) 300 MG PO TB24  TAKE ONE TABLET BY MOUTH DAILY 90 tablet 1    CYCLE FILL MEDICATION. Authorization is required f ...  . CYANOCOBALAMIN 1000 MCG/ML IJ SOLN Intramuscular Inject 1,000 mcg into the muscle every 30 (thirty) days.     . CYCLOSPORINE 0.05 % OP EMUL  1 drop 2 (two) times daily.     . CYMBALTA 30 MG PO CPEP  TAKE ONE CAPSULE BY MOUTH DAILY 90 each 3    CYCLE FILL MEDICATION.  Authorization is required f ...  . CYMBALTA 60 MG PO CPEP  TAKE ONE CAPSULE BY MOUTH DAILY 90 each 2    CYCLE FILL MEDICATION. Authorization is required f ...  . DIAZEPAM 5 MG PO TABS Oral Take 5 mg by mouth 2 (two) times daily as needed.      Marland Kitchen DICYCLOMINE HCL 10 MG PO CAPS  TAKE 1 CAPSULE BY MOUTH 4 TIMES DAILY 360 capsule 3    CYCLE FILL MEDICATION. Authorization is required f ...  . ESOMEPRAZOLE MAGNESIUM 40 MG PO CPDR Oral Take 40 mg by mouth 2 (two) times daily.     Marland Kitchen GABAPENTIN 300 MG PO CAPS  TAKE 3 CAPSULES BY MOUTH FOUR TIMES DAILY 360 capsule 2    CYCLE FILL MEDICATION. Authorization is required f ...  . GUAIFENESIN ER 600 MG PO TB12 Oral Take 1,200 mg by mouth 2 (two) times daily.    . OXYBUTYNIN CHLORIDE ER 5 MG PO TB24 Oral Take 5 mg by mouth 2 (two) times daily.      . OXYCODONE-ACETAMINOPHEN 10-325 MG PO TABS Oral Take 1 tablet by mouth every 6 (six) hours as needed.      Marland Kitchen POTASSIUM CHLORIDE 10 MEQ PO TBCR Oral Take 20 mEq by mouth 2 (two) times daily.      Marland Kitchen PREDNISONE 5 MG PO TABS Oral Take 5-10 mg by mouth daily.     Marland Kitchen PROMETHAZINE HCL 25 MG PO TABS  TAKE ONE TABLET EVERY 6 HOURS AS NEEDED FOR NAUSEA 30 tablet 0    CYCLE FILL MEDICATION. Authorization is required f ...  . TRAMADOL HCL 50 MG PO TABS  TAKE ONE TABLET BY MOUTH EVERY 6 HOURS AS NEEDED FOR PAIN 180 tablet 1    CYCLE FILL MEDICATION. Authorization is required f ...  . GLUCOSE BLOOD VI STRP Other 1 each by Other route 5 (five) times daily. Use as instructed       BP 109/55  Pulse 61  Temp(Src) 98.4 F (36.9 C) (Oral)  Resp 15  SpO2 100%  Physical Exam  Nursing note and vitals reviewed. Constitutional: He is oriented to person, place, and time. He appears well-developed and well-nourished.  HENT:  Head: Normocephalic and atraumatic.  Neck: Neck supple.  Cardiovascular: Normal rate, regular rhythm and normal heart sounds.   No murmur heard. Pulmonary/Chest: Effort normal and breath sounds normal.    Abdominal: Soft. Bowel sounds are normal. He exhibits no distension and no mass. There is no tenderness. There is no rebound and no guarding.  Musculoskeletal: Normal range of motion. He exhibits no edema.  Neurological: He is alert and oriented to person, place, and time. No cranial nerve deficit.  Skin: Skin is warm and dry.  Psychiatric:  He has a normal mood and affect.    ED Course  Procedures (including critical care time)  Labs Reviewed  DIFFERENTIAL - Abnormal; Notable for the following:    Monocytes Relative 18 (*)    All other components within normal limits  BASIC METABOLIC PANEL - Abnormal; Notable for the following:    GFR calc non Af Amer 87 (*)    All other components within normal limits  CBC  TROPONIN I   Dg Chest 2 View  12/29/2011  *RADIOLOGY REPORT*  Clinical Data: Chest pain.  CHEST - 2 VIEW  Comparison: Plain films of the chest 08/17/2011 and CT chest 08/18/2011.  Findings: Lungs are clear.  Heart size is normal.  Cardiac and mediastinal contours are unchanged.  No pneumothorax or effusion.  IMPRESSION: No acute finding.  Stable compared to prior exam.  Original Report Authenticated By: Bernadene Bell. D'ALESSIO, M.D.     1. Chest pain   2. Sarcoid      Date: 12/29/2011  Rate: 70  Rhythm: normal sinus rhythm  QRS Axis: normal  Intervals: normal  ST/T Wave abnormalities: normal  Conduction Disutrbances:none  Narrative Interpretation:   Old EKG Reviewed: unchanged    MDM  Patient with left-sided chest pain. He has a history of sarcoid. EKG is reassuring. He also is a history of cardiomyopathy. X-ray lab work is stable. The patient admitted for further evaluation.        Juliet Rude. Rubin Payor, MD 12/29/11 1027

## 2011-12-29 NOTE — Progress Notes (Signed)
CARDIOLOGY CONSULT NOTE   Patient ID: [name] MRN: [id number] DOB/AGE: 47/21/1967 47 y.o.  Admit date: 12/29/2011  Primary Physician   Rogelia Boga, MD, MD Primary Cardiologist  Dr Glori Luis, at [facility] saw in 2009) Reason for Consultation   Chest pain  [name] is a 46 y.o. male with no history of CAD.  He has been followed at [facility] for cardiology and for his sarcoid. He has not had a recent stress test and has not had a heart catheterization since 1998. He does not usually get chest pain.  [name] was awakened this morning by upper left-sided chest pain at 5:30 AM. The pain went up into his neck and down his left shoulder and into his left arm. There were some paresthesias associated with this. The pain was worse with deep inspiration. It was a 7 or 8/10. He describes it as a pressure feeling and also some stabbing pain. It was associated with nausea but no diaphoresis. There was no real shortness of breath. He did not take any medications for the pain. When his symptoms did not resolve, he woke his wife and she brought him to the emergency room. In the emergency room he received sublingual nitroglycerin, Zofran, aspirin and a total of 8 mg of morphine. With the medications, his pain decreased to a 3/10 but has increased and is now a 7/10. He does not member having this pain before. The only other recent medical problem was night sweats which woke him for nights ago. That is the only time he has ever had night sweats. In general, he feels that he is doing fairly well. His chest wall is not tender to palpation.   Past Medical History  Diagnosis Date  . B12 DEFICIENCY 03/06/2007  . CARDIOMYOPATHY, IDIOPATHIC HYPERTROPHIC 03/06/2007  . DEPRESSION 03/06/2007  . GERD 06/01/2007  . HYPERCHOLESTEROLEMIA 03/06/2007  . HYPERLIPIDEMIA 11/08/2008  . HYPERTENSION 03/06/2007  . IMPAIRED GLUCOSE TOLERANCE 11/08/2008  . NEPHROLITHIASIS, HX OF 04/07/2010    . 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 OSTEOARTHRITIS 06/01/2007  . Palpitations 10/23/2007  . SLEEP APNEA 03/06/2007  . SYNDROME, CHRONIC PAIN 03/06/2007  . Shortness of breath   . Cancer     prostate ca     Past Surgical History  Procedure Date  . Decompression and fusion     cervicothoracic  . Partial nephrectomy   . Cardiac catheterization   . Lumbar fusion   . Prostate surgery     robotic prostatectomy    No Known Allergies  I have reviewed the patient's current medications    . aspirin  325 mg Oral Daily  .  morphine injection  4 mg Intravenous Once  .  morphine injection  4 mg Intravenous Once  . ondansetron (ZOFRAN) IV  4 mg Intravenous Once  . ondansetron (ZOFRAN) IV  4 mg Intravenous Once  . pneumococcal 23 valent vaccine  0.5 mL Intramuscular To ER  . sodium chloride  500 mL Intravenous Once      . sodium chloride 125 mL/hr at 12/29/11 1337   nitroGLYCERIN, promethazine  No current facility-administered medications on file prior to encounter.   Current Outpatient Prescriptions on File Prior to Encounter  Medication Sig Dispense Refill  . albuterol (PROVENTIL,VENTOLIN) 90 MCG/ACT inhaler Inhale 2 puffs into the lungs every 6 (six) hours as needed for wheezing.  17 g  12  . atenolol (TENORMIN) 25 MG tablet 1 tablet twice daily  180 tablet  3  .  atorvastatin (LIPITOR) 40 MG tablet TAKE ONE TABLET BY MOUTH DAILY  90 tablet  3  . AZASAN 100 MG tablet TAKE ONE TABLET BY MOUTH TWICE DAILY  180 tablet  1  . azaTHIOprine (IMURAN) 50 MG tablet Take 50 mg by mouth 2 (two) times daily.        Marland Kitchen buPROPion (WELLBUTRIN XL) 300 MG 24 hr tablet TAKE ONE TABLET BY MOUTH DAILY  90 tablet  1  . cyanocobalamin (COBAL-1000) 1000 MCG/ML injection Inject 1,000 mcg into the muscle every 30 (thirty) days.       . cycloSPORINE (RESTASIS) 0.05 % ophthalmic emulsion 1 drop 2 (two) times daily.       . CYMBALTA 30 MG capsule TAKE ONE CAPSULE BY MOUTH DAILY  90 each  3  . CYMBALTA 60 MG  capsule TAKE ONE CAPSULE BY MOUTH DAILY  90 each  2  . diazepam (VALIUM) 5 MG tablet Take 5 mg by mouth 2 (two) times daily as needed.        . dicyclomine (BENTYL) 10 MG capsule TAKE 1 CAPSULE BY MOUTH 4 TIMES DAILY  360 capsule  3  . esomeprazole (NEXIUM) 40 MG capsule Take 40 mg by mouth 2 (two) times daily.       Marland Kitchen gabapentin (NEURONTIN) 300 MG capsule TAKE 3 CAPSULES BY MOUTH FOUR TIMES DAILY  360 capsule  2  . oxybutynin (DITROPAN-XL) 5 MG 24 hr tablet Take 5 mg by mouth 2 (two) times daily.        Marland Kitchen oxyCODONE-acetaminophen (PERCOCET) 10-325 MG per tablet Take 1 tablet by mouth every 6 (six) hours as needed.        . potassium chloride (KLOR-CON) 10 MEQ CR tablet Take 20 mEq by mouth 2 (two) times daily.        . predniSONE (DELTASONE) 5 MG tablet Take 5-10 mg by mouth daily.       . promethazine (PHENERGAN) 25 MG tablet TAKE ONE TABLET EVERY 6 HOURS AS NEEDED FOR NAUSEA  30 tablet  0  . traMADol (ULTRAM) 50 MG tablet TAKE ONE TABLET BY MOUTH EVERY 6 HOURS AS NEEDED FOR PAIN  180 tablet  1  . glucose blood test strip 1 each by Other route 5 (five) times daily. Use as instructed           History   Social History  . Marital Status: Married    Spouse Name: N/A    Number of Children: N/A  . Years of Education: N/A   Occupational History  .  works at a funeral home    Social History Main Topics  . Smoking status: Former Smoker    Quit date: 09/07/1983  . Smokeless tobacco: Never Used  . Alcohol Use: No  . Drug Use: No  . Sexually Active: Yes   Family History  Problem Relation Age of Onset  . Heart disease Neg Hx     family hx CHF  . Kidney disease Neg Hx     family hx   . Cancer Neg Hx     family hx prostate     ROS: He has chronic musculoskeletal aches and pains. He was very ill when he had the flu and pleurisy in February but feels that he slowly recovered from this. He denies tonic orthopnea or PND and has not had had lower extremity edema. He denies melena. Full 14  point review of systems complete and found to be negative unless listed above.  Physical  Exam: Blood pressure 99/73, pulse 67, temperature 98.4 F (36.9 C), temperature source Oral, resp. rate 16, SpO2 100.00%.  General: Well developed, well nourished, male in no acute distress Head: Eyes PERRLA, No xanthomas.   Normocephalic and atraumatic, oropharynx without edema or exudate. Dentition good Lungs: Clear to auscultation Bilaterally Heart: HRRR S1 S2, no rub/gallop, no murmur. pulses are 2+ all 4 extrem.   Neck: No carotid bruit. No lymphadenopathy.  JVD not elevated. Abdomen: Bowel sounds present, abdomen soft and non-tender without masses or hernias noted. Msk:  No spine or cva tenderness. No weakness, no joint deformities or effusions. Extremities: No clubbing or cyanosis. No edema.  Neuro: Alert and oriented X 3. No focal deficits noted. Psych:  Good affect, responds appropriately Skin: No rashes or lesions noted.  Labs:   Lab Results  Component Value Date   WBC 4.1 12/29/2011   HGB 14.0 12/29/2011   HCT 41.4 12/29/2011   MCV 86.8 12/29/2011   PLT 278 12/29/2011     Lab 12/29/11 0720  NA 139  K 3.8  CL 104  CO2 24  BUN 12  CREATININE 1.01  CALCIUM 9.3  PROT --  BILITOT --  ALKPHOS --  ALT --  AST --  GLUCOSE 92    Basename 12/29/11 1152 12/29/11 0720  CKTOTAL 146 --  CKMB 3.0 --  TROPONINI -- <0.30   Echo: 10/31/2007 SUMMARY - Overall left ventricular systolic function was vigorous. Left ventricular ejection fraction was estimated , range being 65 % to 70 %. There were no left ventricular regional wall motion abnormalities. There was moderate focal basal septal hypertrophy. There was no systolic anterior motion of the mitral valve. Doppler parameters were consistent with abnormal left ventricular relaxation. - The left atrium was mildly dilated. - The pulmonary veins were grossly normal.  ECG:  SR, rate 70 with no acute ischemic changes  Radiology:  Dg  Chest 2 View 12/29/2011  *RADIOLOGY REPORT*  Clinical Data: Chest pain.  CHEST - 2 VIEW  Comparison: Plain films of the chest 08/17/2011 and CT chest 08/18/2011.  Findings: Lungs are clear.  Heart size is normal.  Cardiac and mediastinal contours are unchanged.  No pneumothorax or effusion.  IMPRESSION: No acute finding.  Stable compared to prior exam.  Original Report Authenticated By: Bernadene Bell. D'ALESSIO, M.D.    ASSESSMENT AND PLAN:   The patient was seen today by Dr Clifton James, the patient evaluated and the data reviewed.  Principal Problem:  *Chest pain at rest - initial cardiac enzymes are negative despite having continuous pain since 5:30 AM. However, his pain responded to nitroglycerin. Will add IV nitroglycerin and use this plus morphine for pain control as his blood pressure will allow. An echocardiogram has been ordered. If his cardiac enzymes elevate, heparin will be added. If his EF is normal with no wall motion abnormalities and his cardiac enzymes remain negative, consideration can be given to a Myoview versus followup with his primary cardiologist at [facility]. We will continue to follow him with you.  Otherwise, per primary M.D.:  NEUROSARCOIDOSIS  B12 DEFICIENCY  IMPAIRED GLUCOSE TOLERANCE  HYPERCHOLESTEROLEMIA  SYNDROME, CHRONIC PAIN  CARDIOMYOPATHY, IDIOPATHIC HYPERTROPHIC  OSTEOARTHRITIS  Palpitations   Signed: Theodore Demark 12/29/2011, 1:45 PM Co-Sign MD  I have personally seen and examined this patient with Theodore Demark, PA-C. I agree with the assessment and plan as outlined above. This patient has no known CAD. He has chronic pain syndrome and is on multiple pain control agents at  home. He tells me this is because of his neurosardoidosis. He has been having chest pain over the left chest wall with radiation into the left shoulder for the last 11 hours. The pain has been at least moderate for the entire day. First troponin is negative. EKG shows NSR with no ischemic  changes, unchanged from old EKG. I agree with monitoring in the step down ICU. Will cycle cardiac enzymes. It cardiac enzymes negative in am, can consider stress myoview. Will hold NPO at midnight for possible procedure in am. For now, morphine for pain control.   Zeriah Baysinger 4:35 PM 12/29/2011

## 2011-12-29 NOTE — H&P (Signed)
PCP:  Rogelia Boga, MD, MD   DOA:  12/29/2011  6:54 AM  Chief Complaint:  Chest pain x 1 day  HPI: 46 y/o obese male with hx of sarcoidosis with neuropathic pain, visual  symptoms , hx of cardiomyopathy ( followed with Dr Jens Som in past and now follows with cardiologist at [facility]), HTN, hx of palpitations, with hospitalization in February this year for influenza A and pleurisy presented to ED with chest pain since this am which woke him up from sleep. He describes the pain to be sharp squeezing located over left chest and radiating to the neck and left arm lasting for several minutes. The pain was 8/10 in intensity and aggravated mainly on moving. He informs having off and on chest pains in past but were usually substernal and never radiating like this. He also had worsening  Nausea with the pain but denies vomiting. Also had some shortness of breath with chest pain and palpitations. denies headache, fever, chills, abdominal pain, bowel or urinary symptoms. Also denies any chest trauma or heavy weight lifting. At baseline has a sedentary lifestyle. He informs having stress test done remotely.   in the ED he had an EKG done which was unremarkable and initial set of troponin were negative. [facility] hospitalist called for admission for r/o ACS.  Allergies: No Known Allergies  Prior to Admission medications   Medication Sig Start Date End Date Taking? Authorizing Provider  albuterol (PROVENTIL,VENTOLIN) 90 MCG/ACT inhaler Inhale 2 puffs into the lungs every 6 (six) hours as needed for wheezing. 08/19/11 08/13/12 Yes Sorin Luanne Bras, MD  Artificial Tear GEL Apply 1 drop to eye at bedtime. For dry eye   Yes Historical Provider, MD  atenolol (TENORMIN) 25 MG tablet 1 tablet twice daily 10/21/11  Yes Gordy Savers, MD  atorvastatin (LIPITOR) 40 MG tablet TAKE ONE TABLET BY MOUTH DAILY 12/07/10  Yes Gordy Savers, MD  AZASAN 100 MG tablet TAKE ONE TABLET BY MOUTH TWICE DAILY 07/24/11  Yes  Gordy Savers, MD  azaTHIOprine (IMURAN) 50 MG tablet Take 50 mg by mouth 2 (two) times daily.     Yes Historical Provider, MD  buPROPion (WELLBUTRIN XL) 300 MG 24 hr tablet TAKE ONE TABLET BY MOUTH DAILY 09/21/11  Yes Gordy Savers, MD  cyanocobalamin (COBAL-1000) 1000 MCG/ML injection Inject 1,000 mcg into the muscle every 30 (thirty) days.    Yes Historical Provider, MD  cycloSPORINE (RESTASIS) 0.05 % ophthalmic emulsion 1 drop 2 (two) times daily.    Yes Historical Provider, MD  CYMBALTA 30 MG capsule TAKE ONE CAPSULE BY MOUTH DAILY 01/05/11  Yes Gordy Savers, MD  CYMBALTA 60 MG capsule TAKE ONE CAPSULE BY MOUTH DAILY 09/06/11  Yes Gordy Savers, MD  diazepam (VALIUM) 5 MG tablet Take 5 mg by mouth 2 (two) times daily as needed.     Yes Historical Provider, MD  dicyclomine (BENTYL) 10 MG capsule TAKE 1 CAPSULE BY MOUTH 4 TIMES DAILY 07/01/11  Yes Gordy Savers, MD  esomeprazole (NEXIUM) 40 MG capsule Take 40 mg by mouth 2 (two) times daily.    Yes Historical Provider, MD  gabapentin (NEURONTIN) 300 MG capsule TAKE 3 CAPSULES BY MOUTH FOUR TIMES DAILY 07/24/11  Yes Gordy Savers, MD  guaiFENesin (MUCINEX) 600 MG 12 hr tablet Take 1,200 mg by mouth 2 (two) times daily.   Yes Historical Provider, MD  oxybutynin (DITROPAN-XL) 5 MG 24 hr tablet Take 5 mg by mouth 2 (two) times  daily.     Yes Historical Provider, MD  oxyCODONE-acetaminophen (PERCOCET) 10-325 MG per tablet Take 1 tablet by mouth every 6 (six) hours as needed.     Yes Historical Provider, MD  potassium chloride (KLOR-CON) 10 MEQ CR tablet Take 20 mEq by mouth 2 (two) times daily.     Yes Historical Provider, MD  predniSONE (DELTASONE) 5 MG tablet Take 5-10 mg by mouth daily.    Yes Historical Provider, MD  promethazine (PHENERGAN) 25 MG tablet TAKE ONE TABLET EVERY 6 HOURS AS NEEDED FOR NAUSEA 09/12/11  Yes Gordy Savers, MD  traMADol (ULTRAM) 50 MG tablet TAKE ONE TABLET BY MOUTH EVERY 6 HOURS AS  NEEDED FOR PAIN 08/10/11  Yes Gordy Savers, MD  glucose blood test strip 1 each by Other route 5 (five) times daily. Use as instructed     Historical Provider, MD    Past Medical History  Diagnosis Date  . B12 DEFICIENCY 03/06/2007  . CARDIOMYOPATHY, IDIOPATHIC HYPERTROPHIC 03/06/2007  . DEPRESSION 03/06/2007  . GERD 06/01/2007  . HYPERCHOLESTEROLEMIA 03/06/2007  . HYPERLIPIDEMIA 11/08/2008  . HYPERTENSION 03/06/2007  . IMPAIRED GLUCOSE TOLERANCE 11/08/2008  . NEPHROLITHIASIS, HX OF 04/07/2010  . 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 OSTEOARTHRITIS 06/01/2007  . Palpitations 10/23/2007  . SLEEP APNEA 03/06/2007  . SYNDROME, CHRONIC PAIN 03/06/2007    Past Surgical History  Procedure Date  . Decompression and fusion     cervicothoracic  . Partial nephrectomy   . Cardiac catheterization   . Lumbar fusion   . Prostate surgery     robotic prostatectomy    Social History:  reports that he quit smoking about 28 years ago. He has never used smokeless tobacco. He reports that he does not drink alcohol or use illicit drugs.  Family History  Problem Relation Age of Onset  . Heart disease Neg Hx     family hx CHF  . Kidney disease Neg Hx     family hx   . Cancer Neg Hx     family hx prostate    Review of Systems:  Constitutional: Denies fever, chills, diaphoresis, appetite change and fatigue.  HEENT: Denies photophobia, eye pain, does have chronic visual symptoms , denies  redness, hearing loss, ear pain, congestion, sore throat, rhinorrhea, sneezing, mouth sores, trouble swallowing, neck pain, neck stiffness and tinnitus.   Respiratory:  SOB with chest pain , denies cough,   and wheezing.   Cardiovascular:chest pain, palpitations , denies  leg swelling.  Gastrointestinal:  nausea++, denies vomiting, abdominal pain, diarrhea, constipation, blood in stool and abdominal distention.  Genitourinary: Denies dysuria, urgency, frequency, hematuria, flank pain and difficulty urinating.    Musculoskeletal: Denies myalgias, back pain, joint swelling, arthralgias and gait problem.  Skin: Denies pallor, rash and wound.  Neurological: Denies dizziness, seizures, syncope, weakness, light-headedness, numbness and headaches. chronic pain Hematological: Denies adenopathy. Easy bruising, personal or family bleeding history  Psychiatric/Behavioral: Denies suicidal ideation, mood changes, confusion, nervousness, sleep disturbance and agitation   Physical Exam:  Filed Vitals:   12/29/11 0657 12/29/11 0710 12/29/11 0900 12/29/11 1131  BP: 134/89 118/71 109/55 113/67  Pulse: 77 70 61 56  Temp: 98.4 F (36.9 C)     TempSrc: Oral     Resp: 22 18 15 18   SpO2: 93% 99% 100% 100%    Constitutional: Vital signs reviewed.  Patient is an obese male  in no acute distress and cooperative with exam. Alert and oriented x3.  Head: Normocephalic and atraumatic Ear: TM  normal bilaterally Mouth: no erythema or exudates, MMM Eyes: PERRL, EOMI, conjunctivae normal, No scleral icterus.  Neck: Supple, Trachea midline normal ROM, No JVD, mass, thyromegaly, or carotid bruit present.  Cardiovascular: RRR, S1 normal, S2 normal, no MRG, pulses symmetric and intact bilaterally, non reproducible pain on exam  Pulmonary/Chest: CTAB, no wheezes, rales, or rhonchi Abdominal: Soft. Non-tender, non-distended, bowel sounds are normal, no masses, organomegaly, or guarding present.  GU: no CVA tenderness Musculoskeletal: No joint deformities, erythema, or stiffness, ROM full and no nontender Ext: no edema and no cyanosis, pulses palpable bilaterally (DP and PT) Hematology: no cervical, inginal, or axillary adenopathy.  Neurological: A&O x3, Strenght is normal and symmetric bilaterally, cranial nerve II-XII are grossly intact, no focal motor deficit, sensory intact to light touch bilaterally.  Skin: Warm, dry and intact. No rash, cyanosis, or clubbing.  Psychiatric: Normal mood and affect. speech and behavior is  normal. Judgment and thought content normal. Cognition and memory are normal.   Labs on Admission:  Results for orders placed during the hospital encounter of 12/29/11 (from the past 48 hour(s))  CBC     Status: Normal   Collection Time   12/29/11  7:20 AM      Component Value Range Comment   WBC 4.1  4.0 - 10.5 (K/uL)    RBC 4.77  4.22 - 5.81 (MIL/uL)    Hemoglobin 14.0  13.0 - 17.0 (g/dL)    HCT 16.1  09.6 - 04.5 (%)    MCV 86.8  78.0 - 100.0 (fL)    MCH 29.4  26.0 - 34.0 (pg)    MCHC 33.8  30.0 - 36.0 (g/dL)    RDW 40.9  81.1 - 91.4 (%)    Platelets 278  150 - 400 (K/uL)   DIFFERENTIAL     Status: Abnormal   Collection Time   12/29/11  7:20 AM      Component Value Range Comment   Neutrophils Relative 49  43 - 77 (%)    Neutro Abs 2.0  1.7 - 7.7 (K/uL)    Lymphocytes Relative 30  12 - 46 (%)    Lymphs Abs 1.2  0.7 - 4.0 (K/uL)    Monocytes Relative 18 (*) 3 - 12 (%)    Monocytes Absolute 0.7  0.1 - 1.0 (K/uL)    Eosinophils Relative 3  0 - 5 (%)    Eosinophils Absolute 0.1  0.0 - 0.7 (K/uL)    Basophils Relative 1  0 - 1 (%)    Basophils Absolute 0.0  0.0 - 0.1 (K/uL)   BASIC METABOLIC PANEL     Status: Abnormal   Collection Time   12/29/11  7:20 AM      Component Value Range Comment   Sodium 139  135 - 145 (mEq/L)    Potassium 3.8  3.5 - 5.1 (mEq/L)    Chloride 104  96 - 112 (mEq/L)    CO2 24  19 - 32 (mEq/L)    Glucose, Bld 92  70 - 99 (mg/dL)    BUN 12  6 - 23 (mg/dL)    Creatinine, Ser 7.82  0.50 - 1.35 (mg/dL)    Calcium 9.3  8.4 - 10.5 (mg/dL)    GFR calc non Af Amer 87 (*) >90 (mL/min)    GFR calc Af Amer >90  >90 (mL/min)   TROPONIN I     Status: Normal   Collection Time   12/29/11  7:20 AM  Component Value Range Comment   Troponin I <0.30  <0.30 (ng/mL)     Radiological Exams on Admission: No results found.   EKG: NSR @70  Twave flattening in avL, no ST- T changes   Assessment/Plan 46 y/o AA male wth hx of HL, sarcoidosis, cardiomyopathy (  idiopathic hypertrophic), GERD, chronic pain syndrome presenting with chest pain at rest. Initial CE and EKG unremarkable. patient being admitted for r/o ACS   *Chest pain at rest History suggestive  of anginal symptoms Will admit to tele under observation  serial CE and EKGs to r/o ACS Ordered ASA 325 mg , S/L Nitro prn  switch atenolol to metoprolol  will get 2D echo Check lipid panel, A1C Will get cardiology eval  ( [facility]) . Will likely need inpatient stress test once chest pain resolves and ruled out for ACS. last stress test in 2005 and was negative  Active Problems:  NEUROSARCOIDOSIS  CARDIOMYOPATHY, IDIOPATHIC HYPERTROPHIC  Palpitations  B12 DEFICIENCY  IMPAIRED GLUCOSE TOLERANCE  HYPERCHOLESTEROLEMIA  SYNDROME, CHRONIC PAIN  OSTEOARTHRITIS  Continue remaining home meds  DVT prophylaxis  Cardiac diet  Full code   Plan discussed with patient. Time Spent on Admission: 60 minutes  Taevin Mcferran 12/29/2011, 11:53 AM

## 2011-12-30 ENCOUNTER — Inpatient Hospital Stay (HOSPITAL_COMMUNITY): Payer: Medicare Other

## 2011-12-30 DIAGNOSIS — I517 Cardiomegaly: Secondary | ICD-10-CM | POA: Diagnosis not present

## 2011-12-30 DIAGNOSIS — D869 Sarcoidosis, unspecified: Secondary | ICD-10-CM | POA: Diagnosis not present

## 2011-12-30 DIAGNOSIS — R079 Chest pain, unspecified: Secondary | ICD-10-CM

## 2011-12-30 DIAGNOSIS — E669 Obesity, unspecified: Secondary | ICD-10-CM | POA: Diagnosis not present

## 2011-12-30 LAB — CARDIAC PANEL(CRET KIN+CKTOT+MB+TROPI)
CK, MB: 2.5 ng/mL (ref 0.3–4.0)
CK, MB: 2.1 ng/mL (ref 0.3–4.0)
Relative Index: 2.4 (ref 0.0–2.5)
Relative Index: INVALID (ref 0.0–2.5)
Total CK: 104 U/L (ref 7–232)
Total CK: 90 U/L (ref 7–232)
Troponin I: <0.3 ng/mL (ref <0.30)
Troponin I: <0.3 ng/mL (ref <0.30)

## 2011-12-30 LAB — D-DIMER, QUANTITATIVE: D-Dimer, Quant: <0.22 ug/mL-FEU (ref 0.00–0.48)

## 2011-12-30 MED ORDER — TECHNETIUM TC 99M TETROFOSMIN IV KIT
10.0000 | PACK | Freq: Once | INTRAVENOUS | Status: AC | PRN
  Administered 2011-12-30: 10 via INTRAVENOUS

## 2011-12-30 MED ORDER — TECHNETIUM TC 99M TETROFOSMIN IV KIT
30.0000 | PACK | Freq: Once | INTRAVENOUS | Status: AC | PRN
  Administered 2011-12-30: 30 via INTRAVENOUS

## 2011-12-30 MED ORDER — REGADENOSON 0.4 MG/5ML IV SOLN
0.4000 mg | Freq: Once | INTRAVENOUS | Status: AC
  Administered 2011-12-30: 0.4 mg via INTRAVENOUS
  Filled 2011-12-30: qty 5

## 2011-12-30 NOTE — Progress Notes (Signed)
Lexiscan nuclear stress test performed. Slight TWI avL but otherwise no acute EKG changes. Patient tolerated well. Await interpretation of images - see Dr. Ludwig Clarks note from today.  Timothy Trudell PA-C

## 2011-12-30 NOTE — Progress Notes (Signed)
  [facility]   Subjective: Still having persistent left-sided chest discomfort that worsens with respiratory effort. 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 No dyspnea on exertion or other shortness of breath.  Objective: Vital signs in last 24 hours: Temp:  [97.7 F (36.5 C)-99.1 F (37.3 C)] 98.1 F (36.7 C) (04/25 0722) Pulse Rate:  [56-102] 97  (04/25 1137) Resp:  [13-19] 16  (04/25 0900) BP: (101-129)/(50-74) 118/65 mmHg (04/25 1137) SpO2:  [96 %-100 %] 96 % (04/25 0900) FiO2 (%):  [2 %] 2 % (04/24 1946) Weight:  [102.059 kg (225 lb)] 102.059 kg (225 lb) (04/24 1459) Weight change:  Last BM Date: 12/28/11  Intake/Output from previous day: 04/24 0701 - 04/25 0700 In: 2753.9 [P.O.:720; I.V.:2033.9] Out: 2675 [Urine:2675] Intake/Output this shift: Total I/O In: 312.4 [P.O.:60; I.V.:252.4] Out: 880 [Urine:880]  General appearance: alert, cooperative, appears stated age and no distress Resp: clear to auscultation bilaterally, 2 L nasal cannula oxygen with saturations 97% Cardio: regular rate and rhythm, S1, S2 normal, no murmur, click, rub or gallop, chest pain not reproducible with palpation of anterior chest wall GI: soft, non-tender; bowel sounds normal; no masses,  no organomegaly Extremities: extremities normal, atraumatic, no cyanosis or edema Neurologic: Grossly normal  Lab Results:  Basename 12/29/11 0720  WBC 4.1  HGB 14.0  HCT 41.4  PLT 278   BMET  Basename 12/29/11 0720  NA 139  K 3.8  CL 104  CO2 24  GLUCOSE 92  BUN 12  CREATININE 1.01  CALCIUM 9.3    Studies/Results: Dg Chest 2 View  12/29/2011  *RADIOLOGY REPORT*  Clinical Data: Chest pain.  CHEST - 2 VIEW  Comparison: Plain films of the chest 08/17/2011 and CT chest 08/18/2011.  Findings: Lungs are clear.  Heart size is normal.  Cardiac and mediastinal contours are unchanged.  No pneumothorax or effusion.  IMPRESSION: No acute finding.   Stable compared to prior exam.  Original Report Authenticated By: Bernadene Bell. Maricela Curet, M.D.    Medications: I have reviewed the patient's current medications.  Assessment/Plan:  Principal Problem:  *Chest pain at rest *Symptoms do not appear consistent with ischemia but workup in process *Appreciate cardiology evaluation *For nuclear medicine Myoview scan today *D-dimer negative *Chest pain does have pleuritic component or could be related to underlying neurosarcoidosis  Active Problems:  NEUROSARCOIDOSIS/ SYNDROME, CHRONIC PAIN *Has chronic pain but normally in the legs and feet *Will need to followup with physicians at [facility] once discharged *Continue usual home medication: Cyclosporine, prednisone, gabapentin and Imuran   CARDIOMYOPATHY, IDIOPATHIC HYPERTROPHIC *Compensated and as reported by patient no dyspnea on exertion   HYPERTENSION *Blood pressure controlled *Continue Lopressor   SLEEP APNEA   Palpitations *No definitive arrhythmia seen today   B12 DEFICIENCY *Resume injectable B12 after discharge   IMPAIRED GLUCOSE TOLERANCE *Hemoglobin A1c normal   HYPERCHOLESTEROLEMIA * continue atorvastatin   OSTEOARTHRITIS   Disposition *Myoview study negative likely will discharge later today    LOS: 1 day   Junious Silk, ANP pager [phone]  [facility] [email or web address] Password: [email or web address]  12/30/2011, 1:52 PM  I have examined the patient and reviewed the chart. I agree with the above note.   Calvert Cantor, MD [phone]

## 2011-12-30 NOTE — Progress Notes (Signed)
Utilization Review Completed.[name] T4/25/2013   

## 2011-12-30 NOTE — Discharge Summary (Signed)
DISCHARGE SUMMARY  [name]  MR#: [id number]  DOB:06-29-1966  Date of Admission: 12/29/2011 Date of Discharge: 12/30/2011  Attending Physician:Kileen Lange Butler Denmark, MD  Patient's WJX:BJYNWGNFAOZ,HYQMV Homero Fellers, MD, MD  Consults:Treatment Team:  [facility], MD/Dr. Olga Millers  Discharge Diagnoses: Principal Problem:  *Chest pain at rest Active Problems:  NEUROSARCOIDOSIS  B12 DEFICIENCY  IMPAIRED GLUCOSE TOLERANCE  HYPERCHOLESTEROLEMIA  SYNDROME, CHRONIC PAIN  HYPERTENSION  CARDIOMYOPATHY, IDIOPATHIC HYPERTROPHIC  OSTEOARTHRITIS  SLEEP APNEA  Palpitations   Radiology: Dg Chest 2 View  12/29/2011  *RADIOLOGY REPORT*  Clinical Data: Chest pain.  CHEST - 2 VIEW  Comparison: Plain films of the chest 08/17/2011 and CT chest 08/18/2011.  Findings: Lungs are clear.  Heart size is normal.  Cardiac and mediastinal contours are unchanged.  No pneumothorax or effusion.  IMPRESSION: No acute finding.  Stable compared to prior exam.  Original Report Authenticated By: Bernadene Bell. Maricela Curet, M.D.    Laboratory: Results for orders placed during the hospital encounter of 12/29/11 (from the past 48 hour(s))  CBC     Status: Normal   Collection Time   12/29/11  7:20 AM      Component Value Range Comment   WBC 4.1  4.0 - 10.5 (K/uL)    RBC 4.77  4.22 - 5.81 (MIL/uL)    Hemoglobin 14.0  13.0 - 17.0 (g/dL)    HCT 78.4  69.6 - 29.5 (%)    MCV 86.8  78.0 - 100.0 (fL)    MCH 29.4  26.0 - 34.0 (pg)    MCHC 33.8  30.0 - 36.0 (g/dL)    RDW 28.4  13.2 - 44.0 (%)    Platelets 278  150 - 400 (K/uL)   DIFFERENTIAL     Status: Abnormal   Collection Time   12/29/11  7:20 AM      Component Value Range Comment   Neutrophils Relative 49  43 - 77 (%)    Neutro Abs 2.0  1.7 - 7.7 (K/uL)    Lymphocytes Relative 30  12 - 46 (%)    Lymphs Abs 1.2  0.7 - 4.0 (K/uL)    Monocytes Relative 18 (*) 3 - 12 (%)    Monocytes Absolute 0.7  0.1 - 1.0 (K/uL)    Eosinophils Relative 3  0 - 5 (%)    Eosinophils Absolute 0.1  0.0 - 0.7 (K/uL)    Basophils Relative 1  0 - 1 (%)    Basophils Absolute 0.0  0.0 - 0.1 (K/uL)   BASIC METABOLIC PANEL     Status: Abnormal   Collection Time   12/29/11  7:20 AM      Component Value Range Comment   Sodium 139  135 - 145 (mEq/L)    Potassium 3.8  3.5 - 5.1 (mEq/L)    Chloride 104  96 - 112 (mEq/L)    CO2 24  19 - 32 (mEq/L)    Glucose, Bld 92  70 - 99 (mg/dL)    BUN 12  6 - 23 (mg/dL)    Creatinine, Ser 1.02  0.50 - 1.35 (mg/dL)    Calcium 9.3  8.4 - 10.5 (mg/dL)    GFR calc non Af Amer 87 (*) >90 (mL/min)    GFR calc Af Amer >90  >90 (mL/min)   TROPONIN I     Status: Normal   Collection Time   12/29/11  7:20 AM      Component Value Range Comment   Troponin I <0.30  <0.30 (ng/mL)  CK TOTAL AND CKMB     Status: Normal   Collection Time   12/29/11 11:52 AM      Component Value Range Comment   Total CK 146  7 - 232 (U/L)    CK, MB 3.0  0.3 - 4.0 (ng/mL)    Relative Index 2.1  0.0 - 2.5    HEMOGLOBIN A1C     Status: Normal   Collection Time   12/29/11 12:12 PM      Component Value Range Comment   Hemoglobin A1C 5.5  <5.7 (%)    Mean Plasma Glucose 111  <117 (mg/dL)   LIPID PANEL     Status: Normal   Collection Time   12/29/11 12:14 PM      Component Value Range Comment   Cholesterol 130  0 - 200 (mg/dL)    Triglycerides 38  <409 (mg/dL)    HDL 56  >81 (mg/dL)    Total CHOL/HDL Ratio 2.3      VLDL 8  0 - 40 (mg/dL)    LDL Cholesterol 66  0 - 99 (mg/dL)   CARDIAC PANEL(CRET KIN+CKTOT+MB+TROPI)     Status: Normal   Collection Time   12/29/11  4:22 PM      Component Value Range Comment   Total CK 125  7 - 232 (U/L)    CK, MB 2.8  0.3 - 4.0 (ng/mL)    Troponin I <0.30  <0.30 (ng/mL)    Relative Index 2.2  0.0 - 2.5    MRSA PCR SCREENING     Status: Normal   Collection Time   12/29/11  4:28 PM      Component Value Range Comment   MRSA by PCR NEGATIVE  NEGATIVE    CARDIAC PANEL(CRET KIN+CKTOT+MB+TROPI)     Status: Normal    Collection Time   12/29/11 11:16 PM      Component Value Range Comment   Total CK 104  7 - 232 (U/L)    CK, MB 2.5  0.3 - 4.0 (ng/mL)    Troponin I <0.30  <0.30 (ng/mL)    Relative Index 2.4  0.0 - 2.5    CARDIAC PANEL(CRET KIN+CKTOT+MB+TROPI)     Status: Normal   Collection Time   12/30/11  7:15 AM      Component Value Range Comment   Total CK 90  7 - 232 (U/L)    CK, MB 2.1  0.3 - 4.0 (ng/mL)    Troponin I <0.30  <0.30 (ng/mL)    Relative Index RELATIVE INDEX IS INVALID  0.0 - 2.5    D-DIMER, QUANTITATIVE     Status: Normal   Collection Time   12/30/11  8:00 AM      Component Value Range Comment   D-Dimer, Quant <0.22  0.00 - 0.48 (ug/mL-FEU)      Medication List  As of 12/30/2011  5:37 PM   TAKE these medications         albuterol 90 MCG/ACT inhaler   Commonly known as: PROVENTIL,VENTOLIN   Inhale 2 puffs into the lungs every 6 (six) hours as needed for wheezing.      Artificial Tear Gel   Apply 1 drop to eye at bedtime. For dry eye      atenolol 25 MG tablet   Commonly known as: TENORMIN   1 tablet twice daily      atorvastatin 40 MG tablet   Commonly known as: LIPITOR   TAKE ONE TABLET BY MOUTH DAILY  azaTHIOprine 50 MG tablet   Commonly known as: IMURAN   Take 50 mg by mouth 2 (two) times daily.      AZASAN 100 MG tablet   Generic drug: azathioprine   TAKE ONE TABLET BY MOUTH TWICE DAILY      buPROPion 300 MG 24 hr tablet   Commonly known as: WELLBUTRIN XL   TAKE ONE TABLET BY MOUTH DAILY      COBAL-1000 1000 MCG/ML injection   Generic drug: cyanocobalamin   Inject 1,000 mcg into the muscle every 30 (thirty) days.      cycloSPORINE 0.05 % ophthalmic emulsion   Commonly known as: RESTASIS   1 drop 2 (two) times daily.      CYMBALTA 30 MG capsule   Generic drug: DULoxetine   TAKE ONE CAPSULE BY MOUTH DAILY      CYMBALTA 60 MG capsule   Generic drug: DULoxetine   TAKE ONE CAPSULE BY MOUTH DAILY      diazepam 5 MG tablet   Commonly known as:  VALIUM   Take 5 mg by mouth 2 (two) times daily as needed.      dicyclomine 10 MG capsule   Commonly known as: BENTYL   TAKE 1 CAPSULE BY MOUTH 4 TIMES DAILY      esomeprazole 40 MG capsule   Commonly known as: NEXIUM   Take 40 mg by mouth 2 (two) times daily.      gabapentin 300 MG capsule   Commonly known as: NEURONTIN   TAKE 3 CAPSULES BY MOUTH FOUR TIMES DAILY      glucose blood test strip   1 each by Other route 5 (five) times daily. Use as instructed      guaiFENesin 600 MG 12 hr tablet   Commonly known as: MUCINEX   Take 1,200 mg by mouth 2 (two) times daily.      oxybutynin 5 MG tablet   Commonly known as: DITROPAN   Take 5 mg by mouth at bedtime.      oxyCODONE-acetaminophen 10-325 MG per tablet   Commonly known as: PERCOCET   Take 1 tablet by mouth every 6 (six) hours as needed.      potassium chloride 10 MEQ CR tablet   Commonly known as: KLOR-CON   Take 20 mEq by mouth 2 (two) times daily.      predniSONE 5 MG tablet   Commonly known as: DELTASONE   Take 5-10 mg by mouth daily.      promethazine 25 MG tablet   Commonly known as: PHENERGAN   TAKE ONE TABLET EVERY 6 HOURS AS NEEDED FOR NAUSEA      traMADol 50 MG tablet   Commonly known as: ULTRAM   TAKE ONE TABLET BY MOUTH EVERY 6 HOURS AS NEEDED FOR PAIN            History of present illness: 46 year old male patient with known sarcoidosis history of cardiomyopathy presented with left anterior chest pain radiating to the arm that awakened him from sleep. Described as pain level 8/10 and increase in with moving. He also endorses associated shortness of breath and palpitations. The initial EKG as well as cardiac isoenzymes in the ER were negative for acute ischemia. He was hemodynamically stable and afebrile and was not hypoxic at presentation. He was given IV nitroglycerin as well as morphine which diminished but did not resolve the pain. He was admitted for further evaluation and treatment.   Hospital  Course: Principal Problem:  *Chest  pain at rest Active Problems:  NEUROSARCOIDOSIS  B12 DEFICIENCY  IMPAIRED GLUCOSE TOLERANCE  HYPERCHOLESTEROLEMIA  SYNDROME, CHRONIC PAIN  HYPERTENSION  CARDIOMYOPATHY, IDIOPATHIC HYPERTROPHIC  OSTEOARTHRITIS  SLEEP APNEA  Palpitations  Chest pain Patient was monitored on the step down telemetry unit. Cardiac isoenzymes as well as EKG remained stable without any evidence of acute ischemia. Patient had persistent chest discomfort that appeared to be more musculoskeletal and pleuritic in nature. A d-dimer was checked and this was normal. He was not hypoxic and did not have any other findings consistent with acute pneumonia/bronchitis pulmonary embolism. Radiology was consulted and patient underwent a nuclear medicine Myoview scan which is negative for acute reversible ischemia. It was felt that likely the patient's pain was related to a different presentation of his underlying neurosarcoidosis. Primarily his neurosarcoidosis symptoms are in his lower extremities.  Day of Discharge BP 116/61  Pulse 75  Temp(Src) 98.3 F (36.8 C) (Oral)  Resp 17  Ht 5\' 9"  (1.753 m)  Wt 102.059 kg (225 lb)  BMI 33.23 kg/m2  SpO2 95%  General appearance: alert, cooperative, appears stated age and no distress  Resp: clear to auscultation bilaterally, 2 L nasal cannula oxygen with saturations 97%  Cardio: regular rate and rhythm, S1, S2 normal, no murmur, click, rub or gallop, chest pain not reproducible with palpation of anterior chest wall  GI: soft, non-tender; bowel sounds normal; no masses, no organomegaly  Extremities: extremities normal, atraumatic, no cyanosis or edema  Follow-up: With your family doctor- Dr Amador Cunas in 1-2 days and your sarcoidosis doctor as needed.   Junious Silk, ANP pager [phone]   I have reviewed the chart and examined the patient. I agree with the above d/c summary.   Calvert Cantor, MD [phone]

## 2011-12-30 NOTE — Progress Notes (Signed)
@   Subjective:  Patient continues with left upper chest pain and LUE pain; some increase with inspiration.   Objective:  Filed Vitals:   12/30/11 0500 12/30/11 0600 12/30/11 0700 12/30/11 0722  BP: 108/72 107/66 107/64   Pulse:      Temp:    98.1 F (36.7 C)  TempSrc:    Oral  Resp: 16 15 15    Height:      Weight:      SpO2:        Intake/Output from previous day:  Intake/Output Summary (Last 24 hours) at 12/30/11 0747 Last data filed at 12/30/11 0700  Gross per 24 hour  Intake 2753.9 ml  Output   2675 ml  Net   78.9 ml    Physical Exam: Physical exam: Well-developed well-nourished in no acute distress.  Skin is warm and dry.  HEENT is normal.  Neck is supple. Chest is clear to auscultation with normal expansion.  Cardiovascular exam is regular rate and rhythm.  Abdominal exam nontender or distended. No masses palpated. Extremities show no edema. neuro grossly intact    Lab Results: Basic Metabolic Panel:  Basename 12/29/11 0720  NA 139  K 3.8  CL 104  CO2 24  GLUCOSE 92  BUN 12  CREATININE 1.01  CALCIUM 9.3  MG --  PHOS --   CBC:  Basename 12/29/11 0720  WBC 4.1  NEUTROABS 2.0  HGB 14.0  HCT 41.4  MCV 86.8  PLT 278   Cardiac Enzymes:  Basename 12/29/11 2316 12/29/11 1622 12/29/11 1152 12/29/11 0720  CKTOTAL 104 125 146 --  CKMB 2.5 2.8 3.0 --  CKMBINDEX -- -- -- --  TROPONINI <0.30 <0.30 -- <0.30     Assessment/Plan:  1) Chest pain-symptoms very atypical; enzymes neg; ECG with no ST changes; plan myoview; if negative, patient can be dced and fu with his physician at [facility]. Check DDimer given pleuritic component. DC NTG 2) Sarcoid - Management per primary service. 3) Hyperlipidemia - continue statin 4) Hypertension - continue preadmission BP meds at DC  [facility] 12/30/2011, 7:47 AM

## 2011-12-30 NOTE — Progress Notes (Signed)
Pt given d/c instructions, voiced understanding. SL IV d/c'd. No s/sx of complications. VSS. Pt ambulated out with family to private awaiting vehicle.

## 2012-01-04 ENCOUNTER — Other Ambulatory Visit: Payer: Self-pay | Admitting: Internal Medicine

## 2012-01-05 ENCOUNTER — Other Ambulatory Visit: Payer: Self-pay

## 2012-01-05 MED ORDER — OXYBUTYNIN CHLORIDE 5 MG PO TABS: 5.0000 mg | ORAL_TABLET | Freq: Every day | ORAL | Status: DC

## 2012-01-14 DIAGNOSIS — M545 Low back pain, unspecified: Secondary | ICD-10-CM | POA: Diagnosis not present

## 2012-01-14 DIAGNOSIS — IMO0002 Reserved for concepts with insufficient information to code with codable children: Secondary | ICD-10-CM | POA: Diagnosis not present

## 2012-01-18 ENCOUNTER — Ambulatory Visit: Payer: Medicare Other | Admitting: Internal Medicine

## 2012-01-18 DIAGNOSIS — H01009 Unspecified blepharitis unspecified eye, unspecified eyelid: Secondary | ICD-10-CM | POA: Diagnosis not present

## 2012-01-21 ENCOUNTER — Other Ambulatory Visit: Payer: Self-pay | Admitting: Neurosurgery

## 2012-01-21 DIAGNOSIS — M549 Dorsalgia, unspecified: Secondary | ICD-10-CM

## 2012-01-25 ENCOUNTER — Ambulatory Visit
Admission: RE | Admit: 2012-01-25 | Discharge: 2012-01-25 | Disposition: A | Discharge status: Home / Self Care | Payer: Medicare Other | Source: Ambulatory Visit | Attending: Neurosurgery | Admitting: Neurosurgery

## 2012-01-25 DIAGNOSIS — M79609 Pain in unspecified limb: Secondary | ICD-10-CM | POA: Diagnosis not present

## 2012-01-25 DIAGNOSIS — M549 Dorsalgia, unspecified: Secondary | ICD-10-CM

## 2012-01-25 DIAGNOSIS — Z8546 Personal history of malignant neoplasm of prostate: Secondary | ICD-10-CM | POA: Diagnosis not present

## 2012-01-25 DIAGNOSIS — M545 Low back pain, unspecified: Secondary | ICD-10-CM | POA: Diagnosis not present

## 2012-01-25 MED ORDER — GADOBENATE DIMEGLUMINE 529 MG/ML IV SOLN
20.0000 mL | Freq: Once | INTRAVENOUS | Status: AC | PRN
  Administered 2012-01-25: 20 mL via INTRAVENOUS

## 2012-02-04 ENCOUNTER — Other Ambulatory Visit: Payer: Self-pay | Admitting: Internal Medicine

## 2012-02-08 ENCOUNTER — Ambulatory Visit: Payer: Medicare Other | Admitting: Internal Medicine

## 2012-02-08 DIAGNOSIS — Z0289 Encounter for other administrative examinations: Secondary | ICD-10-CM

## 2012-02-08 DIAGNOSIS — IMO0002 Reserved for concepts with insufficient information to code with codable children: Secondary | ICD-10-CM | POA: Diagnosis not present

## 2012-02-15 DIAGNOSIS — R209 Unspecified disturbances of skin sensation: Secondary | ICD-10-CM | POA: Diagnosis not present

## 2012-02-15 DIAGNOSIS — M79609 Pain in unspecified limb: Secondary | ICD-10-CM | POA: Diagnosis not present

## 2012-02-15 DIAGNOSIS — IMO0002 Reserved for concepts with insufficient information to code with codable children: Secondary | ICD-10-CM | POA: Diagnosis not present

## 2012-03-02 ENCOUNTER — Encounter: Payer: Self-pay | Admitting: Internal Medicine

## 2012-03-02 ENCOUNTER — Ambulatory Visit (INDEPENDENT_AMBULATORY_CARE_PROVIDER_SITE_OTHER): Payer: Medicare Other | Admitting: Internal Medicine

## 2012-03-02 VITALS — BP 122/94 | Temp 98.0°F | Wt 229.0 lb

## 2012-03-02 DIAGNOSIS — R002 Palpitations: Secondary | ICD-10-CM

## 2012-03-02 DIAGNOSIS — I428 Other cardiomyopathies: Secondary | ICD-10-CM

## 2012-03-02 DIAGNOSIS — G894 Chronic pain syndrome: Secondary | ICD-10-CM | POA: Diagnosis not present

## 2012-03-02 DIAGNOSIS — E785 Hyperlipidemia, unspecified: Secondary | ICD-10-CM

## 2012-03-02 DIAGNOSIS — K219 Gastro-esophageal reflux disease without esophagitis: Secondary | ICD-10-CM

## 2012-03-02 DIAGNOSIS — E739 Lactose intolerance, unspecified: Secondary | ICD-10-CM

## 2012-03-02 DIAGNOSIS — I10 Essential (primary) hypertension: Secondary | ICD-10-CM | POA: Diagnosis not present

## 2012-03-02 DIAGNOSIS — R079 Chest pain, unspecified: Secondary | ICD-10-CM

## 2012-03-02 NOTE — Progress Notes (Signed)
  Subjective:    Patient ID: [name], male    DOB: 12/22/1965, 46 y.o.   MRN: [id number]  HPI  46 year old patient who is seen today for followup. He has a history of mild hypertension and also hypertrophic cardiomyopathy with palpitations at the present time he is on low-dose atenolol twice daily. This dose was down titrated last visit do to hypotension and some orthostatic symptoms. He states at times his diastolic reading is a bit high. He still has some occasional dizziness but this is more when he bends over. He has a history of chronic pain and was evaluated in the ED 2 months ago for recurrent chest pain. Today he is doing quite well. He has a history of dyslipidemia as well as gastro-a Salter reflux disease. He has depression which has been stable. He does have a health examination certificate was in [place] [employer] and requires completion  BP Readings from Last 3 Encounters:  03/02/12 122/94  12/30/11 107/55  10/21/11 100/70   Wt Readings from Last 3 Encounters:  03/02/12 229 lb (103.874 kg)  12/29/11 225 lb (102.059 kg)  10/21/11 226 lb (102.513 kg)    Review of Systems  Constitutional: Negative for fever, chills, appetite change and fatigue.  HENT: Negative for hearing loss, ear pain, congestion, sore throat, trouble swallowing, neck stiffness, dental problem, voice change and tinnitus.   Eyes: Negative for pain, discharge and visual disturbance.  Respiratory: Negative for cough, chest tightness, wheezing and stridor.   Cardiovascular: Positive for chest pain. Negative for palpitations and leg swelling.  Gastrointestinal: Negative for nausea, vomiting, abdominal pain, diarrhea, constipation, blood in stool and abdominal distention.  Genitourinary: Negative for urgency, hematuria, flank pain, discharge, difficulty urinating and genital sores.  Musculoskeletal: Positive for back pain. Negative for myalgias, joint swelling, arthralgias and gait problem.    Skin: Negative for rash.  Neurological: Positive for light-headedness. Negative for dizziness, syncope, speech difficulty, weakness, numbness and headaches.  Hematological: Negative for adenopathy. Does not bruise/bleed easily.  Psychiatric/Behavioral: Negative for behavioral problems and dysphoric mood. The patient is not nervous/anxious.        Objective:   Physical Exam  Constitutional: He is oriented to person, place, and time. He appears well-developed.       Weight 229 Blood pressure 120/80 on the left and slightly lower on the right  HENT:  Head: Normocephalic.  Right Ear: External ear normal.  Left Ear: External ear normal.  Eyes: Conjunctivae and EOM are normal.  Neck: Normal range of motion.  Cardiovascular: Normal rate and normal heart sounds.   Pulmonary/Chest: Effort normal and breath sounds normal.  Abdominal: Bowel sounds are normal.  Musculoskeletal: Normal range of motion. He exhibits no edema and no tenderness.  Neurological: He is alert and oriented to person, place, and time.  Psychiatric: He has a normal mood and affect. His behavior is normal.          Assessment & Plan:   Hypertension. We'll continue present regimen Hypertrophic are myopathy. Will continue atenolol 25 twice a day Dyslipidemia we'll continue statin therapy Chronic pain syndrome Depression stable  Medications refilled Health examination certificate completed Recheck 3 months

## 2012-03-02 NOTE — Patient Instructions (Signed)
Limit your sodium (Salt) intake    It is important that you exercise regularly, at least 20 minutes 3 to 4 times per week.  If you develop chest pain or shortness of breath seek  medical attention.  You need to lose weight.  Consider a lower calorie diet and regular exercise. 

## 2012-03-08 ENCOUNTER — Other Ambulatory Visit: Payer: Self-pay | Admitting: Internal Medicine

## 2012-03-13 DIAGNOSIS — R0602 Shortness of breath: Secondary | ICD-10-CM | POA: Diagnosis not present

## 2012-03-13 DIAGNOSIS — D869 Sarcoidosis, unspecified: Secondary | ICD-10-CM | POA: Diagnosis not present

## 2012-03-13 DIAGNOSIS — I10 Essential (primary) hypertension: Secondary | ICD-10-CM | POA: Diagnosis not present

## 2012-03-13 DIAGNOSIS — E785 Hyperlipidemia, unspecified: Secondary | ICD-10-CM | POA: Diagnosis not present

## 2012-03-29 DIAGNOSIS — M722 Plantar fascial fibromatosis: Secondary | ICD-10-CM | POA: Diagnosis not present

## 2012-04-05 ENCOUNTER — Other Ambulatory Visit: Payer: Self-pay | Admitting: Internal Medicine

## 2012-04-06 ENCOUNTER — Other Ambulatory Visit: Payer: Self-pay

## 2012-04-06 MED ORDER — TRAMADOL HCL 50 MG PO TABS: 50.0000 mg | ORAL_TABLET | Freq: Four times a day (QID) | ORAL | Status: DC | PRN

## 2012-04-06 MED ORDER — BUPROPION HCL ER (XL) 300 MG PO TB24: 300.0000 mg | ORAL_TABLET | ORAL | Status: DC

## 2012-04-07 ENCOUNTER — Other Ambulatory Visit: Payer: Self-pay | Admitting: Internal Medicine

## 2012-04-07 DIAGNOSIS — M25549 Pain in joints of unspecified hand: Secondary | ICD-10-CM | POA: Diagnosis not present

## 2012-04-21 DIAGNOSIS — M25549 Pain in joints of unspecified hand: Secondary | ICD-10-CM | POA: Diagnosis not present

## 2012-04-28 ENCOUNTER — Other Ambulatory Visit: Payer: Self-pay | Admitting: Internal Medicine

## 2012-05-19 DIAGNOSIS — M545 Low back pain, unspecified: Secondary | ICD-10-CM | POA: Diagnosis not present

## 2012-05-24 ENCOUNTER — Emergency Department (HOSPITAL_COMMUNITY): Payer: Medicare Other

## 2012-05-24 ENCOUNTER — Encounter (HOSPITAL_COMMUNITY): Payer: Self-pay | Admitting: Emergency Medicine

## 2012-05-24 ENCOUNTER — Observation Stay (HOSPITAL_COMMUNITY)
Admission: EM | Admit: 2012-05-24 | Discharge: 2012-05-25 | Disposition: A | Discharge status: Home / Self Care | Payer: Medicare Other | Attending: Emergency Medicine | Admitting: Emergency Medicine

## 2012-05-24 DIAGNOSIS — M549 Dorsalgia, unspecified: Secondary | ICD-10-CM | POA: Insufficient documentation

## 2012-05-24 DIAGNOSIS — I10 Essential (primary) hypertension: Secondary | ICD-10-CM | POA: Diagnosis not present

## 2012-05-24 DIAGNOSIS — M25559 Pain in unspecified hip: Secondary | ICD-10-CM | POA: Diagnosis not present

## 2012-05-24 DIAGNOSIS — K219 Gastro-esophageal reflux disease without esophagitis: Secondary | ICD-10-CM | POA: Insufficient documentation

## 2012-05-24 DIAGNOSIS — E7800 Pure hypercholesterolemia, unspecified: Secondary | ICD-10-CM | POA: Insufficient documentation

## 2012-05-24 DIAGNOSIS — M545 Low back pain, unspecified: Secondary | ICD-10-CM | POA: Diagnosis not present

## 2012-05-24 DIAGNOSIS — Z79899 Other long term (current) drug therapy: Secondary | ICD-10-CM | POA: Insufficient documentation

## 2012-05-24 DIAGNOSIS — Z981 Arthrodesis status: Secondary | ICD-10-CM | POA: Insufficient documentation

## 2012-05-24 DIAGNOSIS — E785 Hyperlipidemia, unspecified: Secondary | ICD-10-CM | POA: Diagnosis not present

## 2012-05-24 DIAGNOSIS — IMO0002 Reserved for concepts with insufficient information to code with codable children: Secondary | ICD-10-CM | POA: Diagnosis not present

## 2012-05-24 DIAGNOSIS — G8929 Other chronic pain: Secondary | ICD-10-CM | POA: Diagnosis not present

## 2012-05-24 DIAGNOSIS — R7309 Other abnormal glucose: Secondary | ICD-10-CM | POA: Insufficient documentation

## 2012-05-24 DIAGNOSIS — M5416 Radiculopathy, lumbar region: Secondary | ICD-10-CM

## 2012-05-24 DIAGNOSIS — Z7902 Long term (current) use of antithrombotics/antiplatelets: Secondary | ICD-10-CM | POA: Insufficient documentation

## 2012-05-24 HISTORY — DX: Malignant neoplasm of prostate: C61

## 2012-05-24 NOTE — ED Notes (Signed)
C/o R low back pain, radiates down R leg to knee, had a passing episode of "bottom of R foot felt hot", h/o back surgery (Dr. Lovell Sheehan, [facility]) ~ 3 yrs ago for deteriorating discs, "had similar sx then", also reports "h/o neurosarcoidosis and has been on prednisone for this", "concern for deteriorating bone also", "feels like bone on bone in R hip". CMS intact & equal in BLE, generally equally weak in BLE. No meds PTA. (Denies: nvd, fever, loss of control of bowel or bladder, urinary sx or other sx)

## 2012-05-24 NOTE — ED Notes (Signed)
PT. REPORTS WOKE UP THIS MORNING WITH RIGHT LOW BACK PAIN RADIATING TO RIGHT LEG , DENIES FALL OR INJURY , STATES HISTORY OF KIDNEY STONES/BACK SURGERY .

## 2012-05-24 NOTE — ED Notes (Signed)
Pt changing into gown, given urinal. Family member at bedside.

## 2012-05-25 ENCOUNTER — Emergency Department (HOSPITAL_COMMUNITY): Payer: Medicare Other

## 2012-05-25 LAB — BASIC METABOLIC PANEL
BUN: 13 mg/dL (ref 6–23)
CO2: 25 mEq/L (ref 19–32)
Calcium: 9.4 mg/dL (ref 8.4–10.5)
Chloride: 102 mEq/L (ref 96–112)
Creatinine, Ser: 1.01 mg/dL (ref 0.50–1.35)
GFR calc Af Amer: >90 mL/min (ref >90)
GFR calc non Af Amer: 87 mL/min — ABNORMAL LOW (ref >90)
Glucose, Bld: 108 mg/dL — ABNORMAL HIGH (ref 70–99)
Potassium: 3.9 mEq/L (ref 3.5–5.1)
Sodium: 136 mEq/L (ref 135–145)

## 2012-05-25 LAB — URINALYSIS, ROUTINE W REFLEX MICROSCOPIC
Glucose, UA: NEGATIVE mg/dL
Hgb urine dipstick: NEGATIVE
Ketones, ur: NEGATIVE mg/dL
Nitrite: NEGATIVE
Protein, ur: NEGATIVE mg/dL
Specific Gravity, Urine: 1.026 (ref 1.005–1.030)
Urobilinogen, UA: 1 mg/dL (ref 0.0–1.0)
pH: 5.5 (ref 5.0–8.0)

## 2012-05-25 LAB — CBC
HCT: 39.5 % (ref 39.0–52.0)
Hemoglobin: 13.8 g/dL (ref 13.0–17.0)
MCH: 30.2 pg (ref 26.0–34.0)
MCHC: 34.9 g/dL (ref 30.0–36.0)
MCV: 86.4 fL (ref 78.0–100.0)
Platelets: 271 10*3/uL (ref 150–400)
RBC: 4.57 MIL/uL (ref 4.22–5.81)
RDW: 13.7 % (ref 11.5–15.5)
WBC: 4.6 10*3/uL (ref 4.0–10.5)

## 2012-05-25 LAB — URINE MICROSCOPIC-ADD ON

## 2012-05-25 LAB — SEDIMENTATION RATE: Sed Rate: 5 mm/hr (ref 0–16)

## 2012-05-25 MED ORDER — SODIUM CHLORIDE 0.9 % IV SOLN
20.0000 mL | INTRAVENOUS | Status: DC
  Administered 2012-05-25: 20 mL via INTRAVENOUS

## 2012-05-25 MED ORDER — METHYLPREDNISOLONE SODIUM SUCC 125 MG IJ SOLR
125.0000 mg | Freq: Once | INTRAMUSCULAR | Status: AC
  Administered 2012-05-25: 125 mg via INTRAVENOUS
  Filled 2012-05-25: qty 2

## 2012-05-25 MED ORDER — KETOROLAC TROMETHAMINE 60 MG/2ML IM SOLN
60.0000 mg | Freq: Once | INTRAMUSCULAR | Status: AC
  Administered 2012-05-25: 60 mg via INTRAMUSCULAR
  Filled 2012-05-25: qty 2

## 2012-05-25 MED ORDER — ACETAMINOPHEN 325 MG PO TABS
650.0000 mg | ORAL_TABLET | ORAL | Status: DC | PRN
  Administered 2012-05-25: 650 mg via ORAL
  Filled 2012-05-25: qty 2

## 2012-05-25 MED ORDER — ONDANSETRON 4 MG PO TBDP
8.0000 mg | ORAL_TABLET | Freq: Once | ORAL | Status: AC
  Administered 2012-05-25: 8 mg via ORAL
  Filled 2012-05-25: qty 2

## 2012-05-25 MED ORDER — DIAZEPAM 5 MG PO TABS
5.0000 mg | ORAL_TABLET | Freq: Four times a day (QID) | ORAL | Status: DC | PRN
  Administered 2012-05-25 (×3): 5 mg via ORAL
  Filled 2012-05-25 (×3): qty 1

## 2012-05-25 MED ORDER — HYDROMORPHONE HCL PF 1 MG/ML IJ SOLN
1.0000 mg | INTRAMUSCULAR | Status: DC | PRN
  Administered 2012-05-25 (×4): 1 mg via INTRAVENOUS
  Filled 2012-05-25 (×4): qty 1

## 2012-05-25 MED ORDER — KETOROLAC TROMETHAMINE 30 MG/ML IJ SOLN
30.0000 mg | Freq: Four times a day (QID) | INTRAMUSCULAR | Status: DC | PRN
  Administered 2012-05-25: 30 mg via INTRAVENOUS
  Filled 2012-05-25: qty 1

## 2012-05-25 MED ORDER — HYDROMORPHONE HCL PF 1 MG/ML IJ SOLN
2.0000 mg | Freq: Once | INTRAMUSCULAR | Status: AC
  Administered 2012-05-25: 2 mg via INTRAMUSCULAR
  Filled 2012-05-25: qty 2

## 2012-05-25 MED ORDER — ONDANSETRON HCL 4 MG/2ML IJ SOLN
4.0000 mg | Freq: Four times a day (QID) | INTRAMUSCULAR | Status: DC | PRN
  Administered 2012-05-25 (×2): 4 mg via INTRAVENOUS
  Filled 2012-05-25 (×2): qty 2

## 2012-05-25 MED ORDER — HYDROMORPHONE HCL 4 MG PO TABS: 4.0000 mg | ORAL_TABLET | ORAL | Status: DC | PRN

## 2012-05-25 MED ORDER — HYDROMORPHONE HCL PF 1 MG/ML IJ SOLN
1.0000 mg | Freq: Four times a day (QID) | INTRAMUSCULAR | Status: DC | PRN
  Administered 2012-05-25 (×2): 1 mg via INTRAVENOUS
  Filled 2012-05-25 (×2): qty 1

## 2012-05-25 NOTE — ED Notes (Signed)
meds given, no changes, NAD, alert, sleepy, interactive. Family at [facility].

## 2012-05-25 NOTE — ED Provider Notes (Signed)
History     CSN: [id number]  Arrival date & time 05/24/12  Barry Brunner   First MD Initiated Contact with Patient 05/24/12 2307      Chief Complaint  Patient presents with  . Back Pain    (Consider location/radiation/quality/duration/timing/severity/associated sxs/prior treatment) HPI Comments: [name]  presents with acute on chronic low back pain which has which has been present since he woke this morning.   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  He does have a history of chronic mild low back pain for which he takes ultram daily and prn percocet, his last dose of this medicine this am,  And did not relieve his pain.  He has a history of lumbar fusion L3-S1.  There is radiation into the right lower extremity to his posterior knee.  There has been no weakness or numbness in the lower extremities and no urinary or bowel retention or incontinence.  Patient has ahistory of prostate cancer and does not use IV drugs.  He also has a history of kidney stones,  But denies dysuria,  Increased frequency of urination or hematuria.  Pain is worse with movement and palpation of his right lower back.   The history is provided by the patient and the spouse.    Past Medical History  Diagnosis Date  . B12 DEFICIENCY 03/06/2007  . CARDIOMYOPATHY, IDIOPATHIC HYPERTROPHIC 03/06/2007  . DEPRESSION 03/06/2007  . GERD 06/01/2007  . HYPERCHOLESTEROLEMIA 03/06/2007  . HYPERLIPIDEMIA 11/08/2008  . HYPERTENSION 03/06/2007  . IMPAIRED GLUCOSE TOLERANCE 11/08/2008  . NEPHROLITHIASIS, HX OF 04/07/2010  . 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 OSTEOARTHRITIS 06/01/2007  . Palpitations 10/23/2007  . SLEEP APNEA 03/06/2007  . SYNDROME, CHRONIC PAIN 03/06/2007  . Shortness of breath   . Cancer     prostate ca  . Prostate cancer     Past Surgical History  Procedure Date  . Decompression and fusion     cervicothoracic  . Partial  nephrectomy   . Cardiac catheterization   . Lumbar fusion   . Prostate surgery     robotic prostatectomy  . Penile prosthesis implant     Family History  Problem Relation Age of Onset  . Heart disease Neg Hx     family hx CHF  . Kidney disease Neg Hx     family hx   . Cancer Neg Hx     family hx prostate    History  Substance Use Topics  . Smoking status: Former Smoker    Quit date: 09/07/1983  . Smokeless tobacco: Never Used  . Alcohol Use: No      Review of Systems  Constitutional: Negative for fever.  Respiratory: Negative for shortness of breath.   Cardiovascular: Negative for chest pain and leg swelling.  Gastrointestinal: Negative for abdominal pain, constipation and abdominal distention.  Genitourinary: Negative for dysuria, urgency, frequency, hematuria, flank pain and difficulty urinating.  Musculoskeletal: Positive for back pain. Negative for joint swelling and gait problem.  Skin: Negative for rash.  Neurological: Negative for weakness and numbness.    Allergies  Review of patient's allergies indicates no known allergies.  Home Medications   Current Outpatient Rx  Name Route Sig Dispense Refill  . ALBUTEROL 90 MCG/ACT IN AERS Inhalation Inhale 2 puffs into the lungs every 6 (six) hours as needed for wheezing. 17 g 12  . ARTIFICIAL TEAR OP GEL Ophthalmic Apply 1 drop to eye  at bedtime. For dry eye    . ATENOLOL 25 MG PO TABS Oral Take 25 mg by mouth 2 (two) times daily.    . ATORVASTATIN CALCIUM 40 MG PO TABS Oral Take 40 mg by mouth daily.    . AZATHIOPRINE 50 MG PO TABS Oral Take 50 mg by mouth 2 (two) times daily.     . BUPROPION HCL ER (XL) 300 MG PO TB24 Oral Take 1 tablet (300 mg total) by mouth every morning. 90 tablet 1    CYCLE FILL MEDICATION. Authorization is required f ...  . CYANOCOBALAMIN 1000 MCG/ML IJ SOLN Intramuscular Inject 1,000 mcg into the muscle every 30 (thirty) days.     . CYCLOSPORINE 0.05 % OP EMUL  1 drop 2 (two) times daily.      Marland Kitchen DICYCLOMINE HCL 10 MG PO CAPS Oral Take 10 mg by mouth 4 (four) times daily - after meals and at bedtime.    . DULOXETINE HCL 30 MG PO CPEP Oral Take 30 mg by mouth daily.    . DULOXETINE HCL 60 MG PO CPEP Oral Take 60 mg by mouth daily.    Marland Kitchen ESOMEPRAZOLE MAGNESIUM 40 MG PO CPDR Oral Take 40 mg by mouth 2 (two) times daily.    Marland Kitchen GABAPENTIN 300 MG PO CAPS Oral Take 900 mg by mouth 3 (three) times daily.    . GUAIFENESIN ER 600 MG PO TB12 Oral Take 1,200 mg by mouth 2 (two) times daily.    . OXYBUTYNIN CHLORIDE 5 MG PO TABS Oral Take 1 tablet (5 mg total) by mouth at bedtime. 90 tablet 1  . OXYCODONE-ACETAMINOPHEN 10-325 MG PO TABS Oral Take 1 tablet by mouth every 6 (six) hours as needed.      Marland Kitchen PREDNISONE 5 MG PO TABS Oral Take 5-10 mg by mouth daily.     Marland Kitchen PROMETHAZINE HCL 25 MG PO TABS Oral Take 25 mg by mouth every 6 (six) hours as needed. For nausea    . TRAMADOL HCL 50 MG PO TABS Oral Take 1 tablet (50 mg total) by mouth every 6 (six) hours as needed for pain. 180 tablet 0    CYCLE FILL MEDICATION. Authorization is required f ...    BP 142/52  Pulse 75  Temp 99 F (37.2 C) (Oral)  Resp 14  SpO2 97%  Physical Exam  Nursing note and vitals reviewed. Constitutional: He appears well-developed and well-nourished.  HENT:  Head: Normocephalic.  Eyes: Conjunctivae normal are normal.  Neck: Normal range of motion. Neck supple.  Cardiovascular: Normal rate and intact distal pulses.        Pedal pulses normal.  Pulmonary/Chest: Effort normal.  Abdominal: Soft. Bowel sounds are normal. He exhibits no distension and no mass.  Musculoskeletal: Normal range of motion. He exhibits no edema.       Lumbar back: He exhibits tenderness. He exhibits no swelling, no edema and no spasm.       Back:       Positive SLR right  Neurological: He is alert. He has normal strength. He displays no atrophy and no tremor. No sensory deficit. Gait abnormal.       No strength deficit noted in hip and  knee flexor and extensor muscle groups.  Ankle flexion and extension intact.  Antalgic gait,  Patient ambulates with lumbar forward flexion secondary to pain.    Skin: Skin is warm and dry.  Psychiatric: He has a normal mood and affect.    ED  Course  Procedures (including critical care time)  Labs Reviewed  URINALYSIS, ROUTINE W REFLEX MICROSCOPIC - Abnormal; Notable for the following:    Bilirubin Urine SMALL (*)     Leukocytes, UA TRACE (*)     All other components within normal limits  URINE MICROSCOPIC-ADD ON   Dg Lumbar Spine Complete  05/24/2012  *RADIOLOGY REPORT*  Clinical Data: Severe low back pain for 3 weeks.  History of prior surgery.  LUMBAR SPINE - COMPLETE 4+ VIEW  Comparison: Plain films lumbar spine 01/14/2012 and MRI 01/25/2012.  Findings: Again seen is postoperative change of L3-S1 fusion with pedicle screws and stabilization bars in place.  Hardware is intact.  Vertebral body height and alignment are maintained. Paraspinous structures are unremarkable.  IMPRESSION: No acute finding.  Status post L3-S1 fusion.   Original Report Authenticated By: Bernadene Bell. D'ALESSIO, M.D.    Dg Hip Complete Right  05/25/2012  *RADIOLOGY REPORT*  Clinical Data: Pain and stiffness.  RIGHT HIP - COMPLETE 2+ VIEW  Comparison: Plain films 06/20/2009.  Findings: Both hips are located.  No evidence of avascular necrosis is identified.  There is no fracture.  The patient is status post lower lumbar fusion.  Penile prosthesis is noted.  No notable degenerative change about the hips.  IMPRESSION: No acute finding or focal abnormality.   Original Report Authenticated By: Bernadene Bell. Maricela Curet, M.D.      No diagnosis found.  00:15  - pt received dilaudid 2 mg  IM with reduction in pain from 10/10 to 8/10 01:30 - repeated dilaudid 2 mg IM,  Added toradol 60 mg IM - patient pain now 9/10.  Great difficulty ambulating without severe pain.    MDM  Pt placed on cdu back pain protocol.  Dr. Dierdre Highman to follow  patient.        Burgess Amor, [place] 05/25/12 [phone]

## 2012-05-25 NOTE — ED Provider Notes (Signed)
Medical screening examination/treatment/procedure(s) were conducted as a shared visit with non-physician practitioner(s) and myself.  I personally evaluated the patient during the encounter  S- LBP no injury. Has h/o chronic back pain, feels similar. Previous lumbar surgery. No weakness/ numbness/ incontinence O-TTP R paralumbar region, no midline tenderness or deformity. No LE deficits with equal DTRs, strengths, and sensorium to light touch.  A- Acute on chronic LBP P- CDU OBS back pain protocol with scheduled medications and serial evaluations.    Sunnie Nielsen, MD 05/25/12 [phone]

## 2012-05-25 NOTE — ED Notes (Signed)
ATTEMPTED TO AMBULATE PT. PT UNABLE TO LEAVE ROOM DUE TO PAIN IN RIGHT LOWER BACK AND RIGHT LEG. STATES TOO PAINFUL TO WALK. PA IS AWARE

## 2012-05-25 NOTE — ED Notes (Signed)
To xray, no changes, alert, NAD, calm.

## 2012-05-25 NOTE — ED Notes (Signed)
The patient is AOx4 and comfortable with his discharge instructions.  The patient's ride home is present.

## 2012-05-25 NOTE — ED Provider Notes (Signed)
Patient in CDU under back pain protocol.  Patient has a history of chronic back pain, with the current episode worsening over the last few days.  No known injury or precipitating event.  Patient with intermittent relief of pain with medication, but pain tends to return with activity.  No neurologic deficits noted.   Patient is able to ambulate with some assistance, however walking aggravates the pain.  Patient states he has access to a walker for mobility assistance at home.    Patient is followed by Lovell Sheehan with neurosurgery and the pain management clinic.  Labs and radiology results reviewed and discussed with patient.  Patient is already on prednisone.  Will provide prescription for po dilaudid on the recommendation of Dr. Weldon Inches.  Patient to be discharged home will follow-up with Dr. Lovell Sheehan.  Jimmye Norman, NP 05/25/12 [phone]

## 2012-05-25 NOTE — ED Provider Notes (Signed)
Medical screening examination/treatment/procedure(s) were performed by non-physician practitioner and as supervising physician I was immediately available for consultation/collaboration.  Jolena Kittle, MD 05/25/12 2326 

## 2012-05-25 NOTE — Discharge Instructions (Signed)

## 2012-05-25 NOTE — Progress Notes (Signed)
Utilization review completed.  

## 2012-05-25 NOTE — ED Notes (Signed)
Pa has been in to address pt family questions.

## 2012-05-25 NOTE — ED Provider Notes (Signed)
Pt seen and examined by me in CDU. Pt with chronic back pain, here with increased pain over last two days. No injuries. Pain mainly in the right lower back radiating into right thigh. Pt on Back pain protocol. Pt denies urinary or fecal incontinence or retention. Denies fever.   Exam: pt in NAD. Regular HR and rhythm. Lungs clear bilaterally. Tender to palpation in right SI joint. Pain with right straight leg raise. 2+ patellar reflexes bilaterally.   Results for orders placed during the hospital encounter of 05/24/12  URINALYSIS, ROUTINE W REFLEX MICROSCOPIC      Component Value Range   Color, Urine YELLOW  YELLOW   APPearance CLEAR  CLEAR   Specific Gravity, Urine 1.026  1.005 - 1.030   pH 5.5  5.0 - 8.0   Glucose, UA NEGATIVE  NEGATIVE mg/dL   Hgb urine dipstick NEGATIVE  NEGATIVE   Bilirubin Urine SMALL (*) NEGATIVE   Ketones, ur NEGATIVE  NEGATIVE mg/dL   Protein, ur NEGATIVE  NEGATIVE mg/dL   Urobilinogen, UA 1.0  0.0 - 1.0 mg/dL   Nitrite NEGATIVE  NEGATIVE   Leukocytes, UA TRACE (*) NEGATIVE  URINE MICROSCOPIC-ADD ON      Component Value Range   Squamous Epithelial / LPF RARE  RARE   WBC, UA 0-2  <3 WBC/hpf   Bacteria, UA RARE  RARE  CBC      Component Value Range   WBC 4.6  4.0 - 10.5 K/uL   RBC 4.57  4.22 - 5.81 MIL/uL   Hemoglobin 13.8  13.0 - 17.0 g/dL   HCT 16.1  09.6 - 04.5 %   MCV 86.4  78.0 - 100.0 fL   MCH 30.2  26.0 - 34.0 pg   MCHC 34.9  30.0 - 36.0 g/dL   RDW 40.9  81.1 - 91.4 %   Platelets 271  150 - 400 K/uL  SEDIMENTATION RATE      Component Value Range   Sed Rate 5  0 - 16 mm/hr  BASIC METABOLIC PANEL      Component Value Range   Sodium 136  135 - 145 mEq/L   Potassium 3.9  3.5 - 5.1 mEq/L   Chloride 102  96 - 112 mEq/L   CO2 25  19 - 32 mEq/L   Glucose, Bld 108 (*) 70 - 99 mg/dL   BUN 13  6 - 23 mg/dL   Creatinine, Ser 7.82  0.50 - 1.35 mg/dL   Calcium 9.4  8.4 - 95.6 mg/dL   GFR calc non Af Amer 87 (*) >90 mL/min   GFR calc Af Amer >90  >90  mL/min   Dg Lumbar Spine Complete  05/24/2012  *RADIOLOGY REPORT*  Clinical Data: Severe low back pain for 3 weeks.  History of prior surgery.  LUMBAR SPINE - COMPLETE 4+ VIEW  Comparison: Plain films lumbar spine 01/14/2012 and MRI 01/25/2012.  Findings: Again seen is postoperative change of L3-S1 fusion with pedicle screws and stabilization bars in place.  Hardware is intact.  Vertebral body height and alignment are maintained. Paraspinous structures are unremarkable.  IMPRESSION: No acute finding.  Status post L3-S1 fusion.   Original Report Authenticated By: Bernadene Bell. D'ALESSIO, M.D.    Dg Hip Complete Right  05/25/2012  *RADIOLOGY REPORT*  Clinical Data: Pain and stiffness.  RIGHT HIP - COMPLETE 2+ VIEW  Comparison: Plain films 06/20/2009.  Findings: Both hips are located.  No evidence of avascular necrosis is identified.  There is no fracture.  The patient is status post lower lumbar fusion.  Penile prosthesis is noted.  No notable degenerative change about the hips.  IMPRESSION: No acute finding or focal abnormality.   Original Report Authenticated By: Bernadene Bell. D'ALESSIO, M.D.     1:33 PM Pt has been receiving pain medications. No improvement in pain. Unable to ambulate on his own at this time. Neurovascularly intact. Will increase pain meds.     Lottie Mussel, PA 05/25/12 1530

## 2012-05-27 ENCOUNTER — Observation Stay (HOSPITAL_COMMUNITY)
Admission: EM | Admit: 2012-05-27 | Discharge: 2012-05-30 | Disposition: A | Discharge status: Home / Self Care | Payer: Medicare Other | Attending: Internal Medicine | Admitting: Internal Medicine

## 2012-05-27 ENCOUNTER — Emergency Department (HOSPITAL_COMMUNITY): Payer: Medicare Other

## 2012-05-27 DIAGNOSIS — E7800 Pure hypercholesterolemia, unspecified: Secondary | ICD-10-CM

## 2012-05-27 DIAGNOSIS — IMO0002 Reserved for concepts with insufficient information to code with codable children: Secondary | ICD-10-CM | POA: Diagnosis not present

## 2012-05-27 DIAGNOSIS — Z23 Encounter for immunization: Secondary | ICD-10-CM | POA: Insufficient documentation

## 2012-05-27 DIAGNOSIS — I10 Essential (primary) hypertension: Secondary | ICD-10-CM

## 2012-05-27 DIAGNOSIS — M549 Dorsalgia, unspecified: Secondary | ICD-10-CM | POA: Insufficient documentation

## 2012-05-27 DIAGNOSIS — F329 Major depressive disorder, single episode, unspecified: Secondary | ICD-10-CM | POA: Insufficient documentation

## 2012-05-27 DIAGNOSIS — I428 Other cardiomyopathies: Secondary | ICD-10-CM

## 2012-05-27 DIAGNOSIS — L720 Epidermal cyst: Secondary | ICD-10-CM

## 2012-05-27 DIAGNOSIS — M47817 Spondylosis without myelopathy or radiculopathy, lumbosacral region: Secondary | ICD-10-CM | POA: Diagnosis not present

## 2012-05-27 DIAGNOSIS — M25559 Pain in unspecified hip: Secondary | ICD-10-CM | POA: Diagnosis not present

## 2012-05-27 DIAGNOSIS — M25551 Pain in right hip: Secondary | ICD-10-CM

## 2012-05-27 DIAGNOSIS — M1990 Unspecified osteoarthritis, unspecified site: Secondary | ICD-10-CM

## 2012-05-27 DIAGNOSIS — F411 Generalized anxiety disorder: Secondary | ICD-10-CM | POA: Diagnosis not present

## 2012-05-27 DIAGNOSIS — R972 Elevated prostate specific antigen [PSA]: Secondary | ICD-10-CM | POA: Diagnosis not present

## 2012-05-27 DIAGNOSIS — D869 Sarcoidosis, unspecified: Secondary | ICD-10-CM | POA: Insufficient documentation

## 2012-05-27 DIAGNOSIS — R52 Pain, unspecified: Secondary | ICD-10-CM

## 2012-05-27 DIAGNOSIS — E785 Hyperlipidemia, unspecified: Secondary | ICD-10-CM | POA: Diagnosis not present

## 2012-05-27 DIAGNOSIS — R079 Chest pain, unspecified: Secondary | ICD-10-CM | POA: Diagnosis not present

## 2012-05-27 DIAGNOSIS — R509 Fever, unspecified: Secondary | ICD-10-CM

## 2012-05-27 DIAGNOSIS — Z87442 Personal history of urinary calculi: Secondary | ICD-10-CM

## 2012-05-27 DIAGNOSIS — G894 Chronic pain syndrome: Secondary | ICD-10-CM

## 2012-05-27 DIAGNOSIS — M545 Low back pain, unspecified: Secondary | ICD-10-CM | POA: Diagnosis not present

## 2012-05-27 DIAGNOSIS — K219 Gastro-esophageal reflux disease without esophagitis: Secondary | ICD-10-CM

## 2012-05-27 DIAGNOSIS — E538 Deficiency of other specified B group vitamins: Secondary | ICD-10-CM

## 2012-05-27 DIAGNOSIS — R002 Palpitations: Secondary | ICD-10-CM | POA: Diagnosis not present

## 2012-05-27 DIAGNOSIS — F3289 Other specified depressive episodes: Secondary | ICD-10-CM | POA: Insufficient documentation

## 2012-05-27 DIAGNOSIS — J111 Influenza due to unidentified influenza virus with other respiratory manifestations: Secondary | ICD-10-CM

## 2012-05-27 DIAGNOSIS — E739 Lactose intolerance, unspecified: Secondary | ICD-10-CM

## 2012-05-27 DIAGNOSIS — G4730 Sleep apnea, unspecified: Secondary | ICD-10-CM

## 2012-05-27 LAB — CBC
HCT: 37.5 % — ABNORMAL LOW (ref 39.0–52.0)
Hemoglobin: 12.8 g/dL — ABNORMAL LOW (ref 13.0–17.0)
MCH: 29.9 pg (ref 26.0–34.0)
MCHC: 34.1 g/dL (ref 30.0–36.0)
MCV: 87.6 fL (ref 78.0–100.0)
Platelets: 249 10*3/uL (ref 150–400)
RBC: 4.28 MIL/uL (ref 4.22–5.81)
RDW: 13.9 % (ref 11.5–15.5)
WBC: 5.2 10*3/uL (ref 4.0–10.5)

## 2012-05-27 LAB — BASIC METABOLIC PANEL
BUN: 11 mg/dL (ref 6–23)
CO2: 28 mEq/L (ref 19–32)
Calcium: 9.2 mg/dL (ref 8.4–10.5)
Chloride: 99 mEq/L (ref 96–112)
Creatinine, Ser: 0.96 mg/dL (ref 0.50–1.35)
GFR calc Af Amer: >90 mL/min (ref >90)
GFR calc non Af Amer: >90 mL/min (ref >90)
Glucose, Bld: 90 mg/dL (ref 70–99)
Potassium: 3.7 mEq/L (ref 3.5–5.1)
Sodium: 134 mEq/L — ABNORMAL LOW (ref 135–145)

## 2012-05-27 LAB — CBC WITH DIFFERENTIAL/PLATELET
Basophils Absolute: 0 10*3/uL (ref 0.0–0.1)
Basophils Relative: 0 % (ref 0–1)
Eosinophils Absolute: 0.1 10*3/uL (ref 0.0–0.7)
Eosinophils Relative: 1 % (ref 0–5)
HCT: 38.8 % — ABNORMAL LOW (ref 39.0–52.0)
Hemoglobin: 13.2 g/dL (ref 13.0–17.0)
Lymphocytes Relative: 16 % (ref 12–46)
Lymphs Abs: 1 10*3/uL (ref 0.7–4.0)
MCH: 29.9 pg (ref 26.0–34.0)
MCHC: 34 g/dL (ref 30.0–36.0)
MCV: 88 fL (ref 78.0–100.0)
Monocytes Absolute: 1.1 10*3/uL — ABNORMAL HIGH (ref 0.1–1.0)
Monocytes Relative: 18 % — ABNORMAL HIGH (ref 3–12)
Neutro Abs: 3.9 10*3/uL (ref 1.7–7.7)
Neutrophils Relative %: 65 % (ref 43–77)
Platelets: 262 10*3/uL (ref 150–400)
RBC: 4.41 MIL/uL (ref 4.22–5.81)
RDW: 13.9 % (ref 11.5–15.5)
WBC: 6.1 10*3/uL (ref 4.0–10.5)

## 2012-05-27 LAB — CREATININE, SERUM
Creatinine, Ser: 0.93 mg/dL (ref 0.50–1.35)
GFR calc Af Amer: >90 mL/min (ref >90)
GFR calc non Af Amer: >90 mL/min (ref >90)

## 2012-05-27 MED ORDER — CYCLOSPORINE 0.05 % OP EMUL
1.0000 [drp] | Freq: Two times a day (BID) | OPHTHALMIC | Status: DC
  Administered 2012-05-28 – 2012-05-30 (×6): 1 [drp] via OPHTHALMIC
  Filled 2012-05-27 (×7): qty 1

## 2012-05-27 MED ORDER — HEPARIN SODIUM (PORCINE) 5000 UNIT/ML IJ SOLN
5000.0000 [IU] | Freq: Three times a day (TID) | INTRAMUSCULAR | Status: DC
  Administered 2012-05-27 – 2012-05-30 (×9): 5000 [IU] via SUBCUTANEOUS
  Filled 2012-05-27 (×11): qty 1

## 2012-05-27 MED ORDER — PROMETHAZINE HCL 25 MG PO TABS
25.0000 mg | ORAL_TABLET | Freq: Four times a day (QID) | ORAL | Status: DC | PRN
  Administered 2012-05-29: 25 mg via ORAL
  Filled 2012-05-27: qty 1

## 2012-05-27 MED ORDER — ALBUTEROL 90 MCG/ACT IN AERS: 2.0000 | INHALATION_SPRAY | Freq: Four times a day (QID) | RESPIRATORY_TRACT | Status: DC | PRN

## 2012-05-27 MED ORDER — SODIUM CHLORIDE 0.9 % IV SOLN
INTRAVENOUS | Status: DC
  Administered 2012-05-28: 20 mL/h via INTRAVENOUS
  Administered 2012-05-29: 14:00:00 via INTRAVENOUS

## 2012-05-27 MED ORDER — HYDROMORPHONE HCL PF 1 MG/ML IJ SOLN
2.0000 mg | Freq: Once | INTRAMUSCULAR | Status: AC
  Administered 2012-05-27: 1 mg via INTRAVENOUS
  Filled 2012-05-27: qty 1
  Filled 2012-05-27: qty 2

## 2012-05-27 MED ORDER — ATENOLOL 25 MG PO TABS
25.0000 mg | ORAL_TABLET | Freq: Two times a day (BID) | ORAL | Status: DC
  Administered 2012-05-27 – 2012-05-30 (×6): 25 mg via ORAL
  Filled 2012-05-27 (×7): qty 1

## 2012-05-27 MED ORDER — GUAIFENESIN ER 600 MG PO TB12
1200.0000 mg | ORAL_TABLET | Freq: Two times a day (BID) | ORAL | Status: DC
  Administered 2012-05-28 – 2012-05-30 (×5): 1200 mg via ORAL
  Filled 2012-05-27 (×6): qty 2

## 2012-05-27 MED ORDER — PREDNISONE 5 MG PO TABS
5.0000 mg | ORAL_TABLET | Freq: Every day | ORAL | Status: DC
  Administered 2012-05-28 – 2012-05-30 (×3): 5 mg via ORAL
  Filled 2012-05-27 (×4): qty 1

## 2012-05-27 MED ORDER — OXYCODONE-ACETAMINOPHEN 10-325 MG PO TABS: 1.0000 | ORAL_TABLET | Freq: Four times a day (QID) | ORAL | Status: DC | PRN

## 2012-05-27 MED ORDER — SODIUM CHLORIDE 0.9 % IV SOLN
INTRAVENOUS | Status: DC
  Administered 2012-05-27 (×4): via INTRAVENOUS

## 2012-05-27 MED ORDER — HYDROMORPHONE HCL 2 MG PO TABS
4.0000 mg | ORAL_TABLET | ORAL | Status: DC | PRN
  Administered 2012-05-27 – 2012-05-30 (×6): 4 mg via ORAL
  Filled 2012-05-27 (×6): qty 2

## 2012-05-27 MED ORDER — GADOBENATE DIMEGLUMINE 529 MG/ML IV SOLN
20.0000 mL | Freq: Once | INTRAVENOUS | Status: AC | PRN
  Administered 2012-05-27: 20 mL via INTRAVENOUS

## 2012-05-27 MED ORDER — DULOXETINE HCL 60 MG PO CPEP: 60.0000 mg | ORAL_CAPSULE | Freq: Every day | ORAL | Status: DC

## 2012-05-27 MED ORDER — HYDROMORPHONE HCL PF 1 MG/ML IJ SOLN
2.0000 mg | Freq: Once | INTRAMUSCULAR | Status: AC
  Administered 2012-05-27: 2 mg via INTRAVENOUS
  Filled 2012-05-27 (×2): qty 1

## 2012-05-27 MED ORDER — ONDANSETRON HCL 4 MG/2ML IJ SOLN
4.0000 mg | Freq: Once | INTRAMUSCULAR | Status: AC
  Administered 2012-05-27: 4 mg via INTRAVENOUS
  Filled 2012-05-27: qty 2

## 2012-05-27 MED ORDER — BUPROPION HCL ER (XL) 300 MG PO TB24
300.0000 mg | ORAL_TABLET | Freq: Every day | ORAL | Status: DC
  Administered 2012-05-28 – 2012-05-30 (×3): 300 mg via ORAL
  Filled 2012-05-27 (×3): qty 1

## 2012-05-27 MED ORDER — GABAPENTIN 300 MG PO CAPS
900.0000 mg | ORAL_CAPSULE | Freq: Four times a day (QID) | ORAL | Status: DC
  Administered 2012-05-28 – 2012-05-30 (×11): 900 mg via ORAL
  Filled 2012-05-27 (×14): qty 3

## 2012-05-27 MED ORDER — ATORVASTATIN CALCIUM 40 MG PO TABS
40.0000 mg | ORAL_TABLET | Freq: Every day | ORAL | Status: DC
  Administered 2012-05-28 – 2012-05-29 (×2): 40 mg via ORAL
  Filled 2012-05-27 (×3): qty 1

## 2012-05-27 MED ORDER — DICYCLOMINE HCL 10 MG PO CAPS
10.0000 mg | ORAL_CAPSULE | Freq: Three times a day (TID) | ORAL | Status: DC
Start: 2012-05-28 — End: 2012-05-30
  Administered 2012-05-28 – 2012-05-30 (×10): 10 mg via ORAL
  Filled 2012-05-27 (×14): qty 1

## 2012-05-27 MED ORDER — AZATHIOPRINE 50 MG PO TABS
50.0000 mg | ORAL_TABLET | Freq: Two times a day (BID) | ORAL | Status: DC
  Administered 2012-05-28 – 2012-05-30 (×5): 50 mg via ORAL
  Filled 2012-05-27 (×6): qty 1

## 2012-05-27 MED ORDER — DULOXETINE HCL 30 MG PO CPEP: 30.0000 mg | ORAL_CAPSULE | Freq: Every day | ORAL | Status: DC

## 2012-05-27 MED ORDER — KETOROLAC TROMETHAMINE 30 MG/ML IJ SOLN
30.0000 mg | Freq: Once | INTRAMUSCULAR | Status: AC
  Administered 2012-05-27: 30 mg via INTRAVENOUS
  Filled 2012-05-27: qty 1

## 2012-05-27 MED ORDER — PANTOPRAZOLE SODIUM 40 MG PO TBEC
80.0000 mg | DELAYED_RELEASE_TABLET | Freq: Every day | ORAL | Status: DC
  Administered 2012-05-28 – 2012-05-30 (×3): 80 mg via ORAL
  Filled 2012-05-27: qty 2
  Filled 2012-05-27 (×2): qty 1
  Filled 2012-05-27: qty 2

## 2012-05-27 MED ORDER — OXYBUTYNIN CHLORIDE 5 MG PO TABS
5.0000 mg | ORAL_TABLET | Freq: Every day | ORAL | Status: DC
  Administered 2012-05-28 – 2012-05-29 (×2): 5 mg via ORAL
  Filled 2012-05-27 (×3): qty 1

## 2012-05-27 MED ORDER — HYDROMORPHONE HCL PF 1 MG/ML IJ SOLN
2.0000 mg | Freq: Once | INTRAMUSCULAR | Status: AC
  Administered 2012-05-27: 2 mg via INTRAVENOUS
  Filled 2012-05-27: qty 2

## 2012-05-27 MED ORDER — CYANOCOBALAMIN 1000 MCG/ML IJ SOLN
1000.0000 ug | INTRAMUSCULAR | Status: DC
  Administered 2012-05-28: 1000 ug via INTRAMUSCULAR
  Filled 2012-05-27: qty 1

## 2012-05-27 MED ORDER — ARTIFICIAL TEAR OP GEL: 1.0000 [drp] | Freq: Every day | OPHTHALMIC | Status: DC

## 2012-05-27 MED ORDER — LORAZEPAM 2 MG/ML IJ SOLN
1.0000 mg | Freq: Once | INTRAMUSCULAR | Status: AC
  Administered 2012-05-27: 10:00:00 via INTRAVENOUS
  Filled 2012-05-27: qty 1

## 2012-05-27 NOTE — ED Notes (Signed)
Admitting physician at bedside

## 2012-05-27 NOTE — H&P (Signed)
[facility] History and Physical  [name] EAV:[id number] DOB: 1966-05-25 DOA: 05/27/2012  Referring physician: ED PCP: Rogelia Boga, MD   Chief Complaint: R hip pain  HPI: [name] is a 46 y.o. 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  He notes that bearing weight does not make it significantly worse but is unable to walk due to pain.  Symptoms became very severe on Wed of this week with fairly rapid onset and decline to his present state in the ED.  He describes the pain as a "bone on bone" sensation in his low back, outside of his hip and radiating down his thigh.  In the ED patient was evaluated by Neurosurgery and MRI did not demonstrate any nerve impingement (though he has been known to have radiculopathy in the past with lumbar fusion).  Unfortunately it is now too late in the day to order an MRI of his R hip itself so hospitalist service has been asked to admit the patient for pain control and the MRI tomorrow.   Review of Systems: 12 systems reviewed and negative except as per HPI.  Past Medical History  Diagnosis Date  . B12 DEFICIENCY 03/06/2007  . CARDIOMYOPATHY, IDIOPATHIC HYPERTROPHIC 03/06/2007  . DEPRESSION 03/06/2007  . GERD 06/01/2007  . HYPERCHOLESTEROLEMIA 03/06/2007  . HYPERLIPIDEMIA 11/08/2008  . HYPERTENSION 03/06/2007  . IMPAIRED GLUCOSE TOLERANCE 11/08/2008  . NEPHROLITHIASIS, HX OF 04/07/2010  . 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 OSTEOARTHRITIS 06/01/2007  . Palpitations 10/23/2007  . SLEEP APNEA 03/06/2007  . SYNDROME, CHRONIC PAIN 03/06/2007  . Shortness of breath   . Cancer     prostate ca  . Prostate cancer    Past Surgical History  Procedure Date  . Decompression and fusion     cervicothoracic  . Partial nephrectomy   . Cardiac catheterization   . Lumbar fusion   .  Prostate surgery     robotic prostatectomy  . Penile prosthesis implant    Social History:  reports that he quit smoking about 28 years ago. He has never used smokeless tobacco. He reports that he does not drink alcohol or use illicit drugs. Lives at home with wife, performs all ADLs  No Known Allergies  Family History  Problem Relation Age of Onset  . Heart disease Neg Hx     family hx CHF  . Kidney disease Neg Hx     family hx   . Cancer Neg Hx     family hx prostate     Prior to Admission medications   Medication Sig Start Date End Date Taking? Authorizing Provider  albuterol (PROVENTIL,VENTOLIN) 90 MCG/ACT inhaler Inhale 2 puffs into the lungs every 6 (six) hours as needed for wheezing. 08/19/11 08/13/12 Yes Sorin Luanne Bras, MD  Artificial Tear GEL Apply 1 drop to eye at bedtime. For dry eye   Yes Historical Provider, MD  atenolol (TENORMIN) 25 MG tablet Take 25 mg by mouth 2 (two) times daily.   Yes Historical Provider, MD  atorvastatin (LIPITOR) 40 MG tablet Take 40 mg by mouth daily.   Yes Historical Provider, MD  azaTHIOprine (IMURAN) 50 MG tablet Take 50 mg by mouth 2 (two) times daily.    Yes Historical Provider, MD  buPROPion (WELLBUTRIN XL) 300 MG 24 hr tablet Take 1 tablet (300 mg total) by mouth every  morning. 04/06/12  Yes Gordy Savers, MD  cyanocobalamin (COBAL-1000) 1000 MCG/ML injection Inject 1,000 mcg into the muscle every 30 (thirty) days.    Yes Historical Provider, MD  cycloSPORINE (RESTASIS) 0.05 % ophthalmic emulsion 1 drop 2 (two) times daily.    Yes Historical Provider, MD  dicyclomine (BENTYL) 10 MG capsule Take 10 mg by mouth 4 (four) times daily - after meals and at bedtime.   Yes Historical Provider, MD  DULoxetine (CYMBALTA) 30 MG capsule Take 30 mg by mouth daily.   Yes Historical Provider, MD  DULoxetine (CYMBALTA) 60 MG capsule Take 60 mg by mouth daily.   Yes Historical Provider, MD  esomeprazole (NEXIUM) 40 MG capsule Take 40 mg by mouth 2 (two)  times daily.   Yes Historical Provider, MD  gabapentin (NEURONTIN) 300 MG capsule Take 900 mg by mouth 4 (four) times daily.    Yes Historical Provider, MD  guaiFENesin (MUCINEX) 600 MG 12 hr tablet Take 1,200 mg by mouth 2 (two) times daily.   Yes Historical Provider, MD  HYDROmorphone (DILAUDID) 4 MG tablet Take 1 tablet (4 mg total) by mouth every 4 (four) hours as needed for pain. 05/25/12  Yes Jimmye Norman, NP  oxybutynin (DITROPAN) 5 MG tablet Take 1 tablet (5 mg total) by mouth at bedtime. 01/05/12  Yes Gordy Savers, MD  oxyCODONE-acetaminophen (PERCOCET) 10-325 MG per tablet Take 1 tablet by mouth every 6 (six) hours as needed. pain   Yes Historical Provider, MD  predniSONE (DELTASONE) 5 MG tablet Take 5 mg by mouth daily.    Yes Historical Provider, MD  promethazine (PHENERGAN) 25 MG tablet Take 25 mg by mouth every 6 (six) hours as needed. For nausea   Yes Historical Provider, MD  traMADol (ULTRAM) 50 MG tablet Take 1 tablet (50 mg total) by mouth every 6 (six) hours as needed for pain. 04/06/12  Yes Gordy Savers, MD   Physical Exam: Filed Vitals:   05/27/12 0728 05/27/12 1012 05/27/12 1602 05/27/12 2100  BP: 131/76 119/75 121/80 115/65  Pulse: 93 102  113  Temp: 98.5 F (36.9 C)     TempSrc: Oral     Resp: 16 18 18    SpO2: 96% 97% 97% 98%     General:  NAD, resting comfortably in hospital bed except when he tries to move his hip he has sudden onset of pain with certain movements  Eyes: PEERLA EOMI  ENT: moist mucous membranes  Neck: supple w/o JVD  Cardiovascular: RRR w/o MRG  Respiratory: CTA B  Abdomen: soft, nt, nd, bs+  Skin: no lesions or rashes  Musculoskeletal: Patient has pain to palpation of the external part of his hip joint, movement of the joint causes obvious pain, straight leg raise test is equivocal (pain is caused but at a much lower angle than i would expect for a positive straight leg raise)  Labs on Admission:  Basic Metabolic  Panel:  Lab 05/27/12 0943 05/25/12 0231  NA 134* 136  K 3.7 3.9  CL 99 102  CO2 28 25  GLUCOSE 90 108*  BUN 11 13  CREATININE 0.96 1.01  CALCIUM 9.2 9.4  MG -- --  PHOS -- --   Liver Function Tests: No results found for this basename: AST:5,ALT:5,ALKPHOS:5,BILITOT:5,PROT:5,ALBUMIN:5 in the last 168 hours No results found for this basename: LIPASE:5,AMYLASE:5 in the last 168 hours No results found for this basename: AMMONIA:5 in the last 168 hours CBC:  Lab 05/27/12 0943 05/25/12 0231  WBC 6.1 4.6  NEUTROABS 3.9 --  HGB 13.2 13.8  HCT 38.8* 39.5  MCV 88.0 86.4  PLT 262 271   Cardiac Enzymes: No results found for this basename: CKTOTAL:5,CKMB:5,CKMBINDEX:5,TROPONINI:5 in the last 168 hours  BNP (last 3 results) No results found for this basename: PROBNP:3 in the last 8760 hours CBG: No results found for this basename: GLUCAP:5 in the last 168 hours  Radiological Exams on Admission: Mr Lumbar Spine W Wo Contrast  05/27/2012  *RADIOLOGY REPORT*  Clinical Data: Prior lumbar surgery.  New right leg pain and buttock/hip pain.  MRI LUMBAR SPINE WITHOUT AND WITH CONTRAST  Technique:  Multiplanar and multiecho pulse sequences of the lumbar spine were obtained without and with intravenous contrast.  Contrast: 20mL MULTIHANCE GADOBENATE DIMEGLUMINE 529 MG/ML IV SOLN  Comparison: 05/24/2012 no plain film examination.  01/25/2012 MR.  Findings: Last fully open disc space is labeled L5-S1.  Present examination incorporates from T11-12 disc space through lower sacrum.  Penile prosthesis implant suspected with reservoir superior to the bladder.  Sub centimeter right renal cyst.  Conus T12 level.  3.5 mm sclerotic focus right ilium without change compared to the post myelogram CT on 07/18/2009.  T11-12 through L1-2 unremarkable.  L2-3:  Facet joint degenerative changes.  L3-4 through L5-S1 prior fusion without complication, significant spinal stenosis or foraminal narrowing noted.  IMPRESSION:   L3-4 through L5-S1 prior fusion without complication, significant spinal stenosis or foraminal narrowing noted.  L2-3 facet joint degenerative changes.  No evidence of right-sided disc herniation or nerve root compression noted  Tiny sclerotic focus right ilium stable.   Original Report Authenticated By: Fuller Canada, M.D.      Assessment/Plan Active Problems:  Hip pain, acute   1. R hip pain - differential includes 1. AVN of the femoral head - quite possible given the fact that this patient has been on chronic steroids, will need an MRI of his R hip to evaluate for this further. 2. Trochanteric bursitis - seems quite severe for this, but is also a possibility. 3. Sciatica - symptoms seem to be more with movement of his hip and not at rest, this is somewhat less likely but still a possibility. 4. Metastatic prostate cancer to the hip - MRI should evaluate this further, but will also order a PSA since it seems he is due for one of these anyhow.  MRI of L spine didn't demonstrate mets so doubtful referred pain from there. 5. Sarcoidosis - patient does have history of neurosarcoidosis but this has been very well controlled for years on prednisone, if sarcoidosis is effecting his hip I would expect to see some abnormality on MRI. 6. Septic arthritis of the hip - Doubtful given the lack of systemic illness, will continue to monitor for signs and symptoms if they develop. 2. Chronic medical problems including HTN, hyperlipidemia, GERD - plan to continue home meds for these for now.  Code Status: Full code Family Communication: spoke at length with patient and wife who are in room Disposition Plan: Admit to obs.  Time spent: 90 mins  Dearia Wilmouth M. [facility] Pager [phone]  If 7PM-7AM, please contact night-coverage [email or web address] Password [facility] 05/27/2012, 10:15 PM

## 2012-05-27 NOTE — ED Provider Notes (Signed)
4:50 PM Pt in CDU awaiting MRI results. Pt with increased lower back pain over 3 days. He denies any injuries, other then pulling a tarp over field, but denies pain at that time. Pt was seen here in ED at time of pain worsening, he was on back pain protocol. Was discharged home. Pt is back today with increased pain, unable to walk. States pain is in right lower back, radiating into right thigh. States pain and numbness in the right leg. Denies weakness. Denies abdominal pain. Pt was taking percocet at home with no improvement. Pt called his neurosurgeon, was told to come here. Pt just had MRI, pain meds given, he is more comfortable now. Will continue to monitor.   Results for orders placed during the hospital encounter of 05/27/12  CBC WITH DIFFERENTIAL      Component Value Range   WBC 6.1  4.0 - 10.5 K/uL   RBC 4.41  4.22 - 5.81 MIL/uL   Hemoglobin 13.2  13.0 - 17.0 g/dL   HCT 16.1 (*) 09.6 - 04.5 %   MCV 88.0  78.0 - 100.0 fL   MCH 29.9  26.0 - 34.0 pg   MCHC 34.0  30.0 - 36.0 g/dL   RDW 40.9  81.1 - 91.4 %   Platelets 262  150 - 400 K/uL   Neutrophils Relative 65  43 - 77 %   Neutro Abs 3.9  1.7 - 7.7 K/uL   Lymphocytes Relative 16  12 - 46 %   Lymphs Abs 1.0  0.7 - 4.0 K/uL   Monocytes Relative 18 (*) 3 - 12 %   Monocytes Absolute 1.1 (*) 0.1 - 1.0 K/uL   Eosinophils Relative 1  0 - 5 %   Eosinophils Absolute 0.1  0.0 - 0.7 K/uL   Basophils Relative 0  0 - 1 %   Basophils Absolute 0.0  0.0 - 0.1 K/uL  BASIC METABOLIC PANEL      Component Value Range   Sodium 134 (*) 135 - 145 mEq/L   Potassium 3.7  3.5 - 5.1 mEq/L   Chloride 99  96 - 112 mEq/L   CO2 28  19 - 32 mEq/L   Glucose, Bld 90  70 - 99 mg/dL   BUN 11  6 - 23 mg/dL   Creatinine, Ser 7.82  0.50 - 1.35 mg/dL   Calcium 9.2  8.4 - 95.6 mg/dL   GFR calc non Af Amer >90  >90 mL/min   GFR calc Af Amer >90  >90 mL/min   Dg Lumbar Spine Complete  05/24/2012  *RADIOLOGY REPORT*  Clinical Data: Severe low back pain for 3  weeks.  History of prior surgery.  LUMBAR SPINE - COMPLETE 4+ VIEW  Comparison: Plain films lumbar spine 01/14/2012 and MRI 01/25/2012.  Findings: Again seen is postoperative change of L3-S1 fusion with pedicle screws and stabilization bars in place.  Hardware is intact.  Vertebral body height and alignment are maintained. Paraspinous structures are unremarkable.  IMPRESSION: No acute finding.  Status post L3-S1 fusion.   Original Report Authenticated By: Bernadene Bell. D'ALESSIO, M.D.    Dg Hip Complete Right  05/25/2012  *RADIOLOGY REPORT*  Clinical Data: Pain and stiffness.  RIGHT HIP - COMPLETE 2+ VIEW  Comparison: Plain films 06/20/2009.  Findings: Both hips are located.  No evidence of avascular necrosis is identified.  There is no fracture.  The patient is status post lower lumbar fusion.  Penile prosthesis is noted.  No notable degenerative change  about the hips.  IMPRESSION: No acute finding or focal abnormality.   Original Report Authenticated By: Bernadene Bell. Maricela Curet, M.D.    Mr Lumbar Spine W Wo Contrast  05/27/2012  *RADIOLOGY REPORT*  Clinical Data: Prior lumbar surgery.  New right leg pain and buttock/hip pain.  MRI LUMBAR SPINE WITHOUT AND WITH CONTRAST  Technique:  Multiplanar and multiecho pulse sequences of the lumbar spine were obtained without and with intravenous contrast.  Contrast: 20mL MULTIHANCE GADOBENATE DIMEGLUMINE 529 MG/ML IV SOLN  Comparison: 05/24/2012 no plain film examination.  01/25/2012 MR.  Findings: Last fully open disc space is labeled L5-S1.  Present examination incorporates from T11-12 disc space through lower sacrum.  Penile prosthesis implant suspected with reservoir superior to the bladder.  Sub centimeter right renal cyst.  Conus T12 level.  3.5 mm sclerotic focus right ilium without change compared to the post myelogram CT on 07/18/2009.  T11-12 through L1-2 unremarkable.  L2-3:  Facet joint degenerative changes.  L3-4 through L5-S1 prior fusion without complication,  significant spinal stenosis or foraminal narrowing noted.  IMPRESSION:  L3-4 through L5-S1 prior fusion without complication, significant spinal stenosis or foraminal narrowing noted.  L2-3 facet joint degenerative changes.  No evidence of right-sided disc herniation or nerve root compression noted  Tiny sclerotic focus right ilium stable.   Original Report Authenticated By: Fuller Canada, M.D.    MR unremarkable, Dr. Danielle Dess to come see in ED.  7:46 PM Pt seen by Dr. Danielle Dess who recommended MR right hip. At this time, no MR techs in the hospital. PT still unable to ambulate for [place]. Will medically admit for pain management and MR in AM.   Spoke with Dr. Julian Reil with [facility], pt not a candidate for inpatient admission. Pt unable to get up or even sit in wheel chair. I spoke with Dr. Adriana Simas who discussed this with Dr. Julian Reil, and who agreed to admit pt.   Lottie Mussel, PA 05/27/12 2222

## 2012-05-27 NOTE — ED Notes (Signed)
Here this past wed for herniated disk in lumbar region. Given dilaudid, valium, phenergan and not able to sleep b/c no pain relief.

## 2012-05-27 NOTE — ED Notes (Signed)
Physician at bedside.

## 2012-05-27 NOTE — Consult Note (Signed)
Reason for Consult: Right hip pain status post back surgery several years ago by Dr. Delma Officer Referring Physician: Lorre Nick  [name] is an 46 y.o. male.  HPI: Patient is a 46 year old right-handed male who has been a patient of Dr. Delma Officer headache three-level lumbar fusion done a couple of years ago patient started developing back pain with pain preponderance in the right hip is had 2 visits to the emergency department now he cannot bear 8 on the right leg 2 days ago he had an x-ray of the right hip which did not reveal any abnormalities in the joint itself today is had an MRI of the back which shows that his fusion appears solid from the entire lumbar spine no evidence of any significant stenosis or nerve root impingement is identified. Patient is seen now because of the severity of the right hip pain. His been advised that he undergo an MRI of the right hip  Past Medical History  Diagnosis Date  . B12 DEFICIENCY 03/06/2007  . CARDIOMYOPATHY, IDIOPATHIC HYPERTROPHIC 03/06/2007  . DEPRESSION 03/06/2007  . GERD 06/01/2007  . HYPERCHOLESTEROLEMIA 03/06/2007  . HYPERLIPIDEMIA 11/08/2008  . HYPERTENSION 03/06/2007  . IMPAIRED GLUCOSE TOLERANCE 11/08/2008  . NEPHROLITHIASIS, HX OF 04/07/2010  . 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 OSTEOARTHRITIS 06/01/2007  . Palpitations 10/23/2007  . SLEEP APNEA 03/06/2007  . SYNDROME, CHRONIC PAIN 03/06/2007  . Shortness of breath   . Cancer     prostate ca  . Prostate cancer     Past Surgical History  Procedure Date  . Decompression and fusion     cervicothoracic  . Partial nephrectomy   . Cardiac catheterization   . Lumbar fusion   . Prostate surgery     robotic prostatectomy  . Penile prosthesis implant     Family History  Problem Relation Age of Onset  . Heart disease Neg Hx     family hx CHF  . Kidney disease Neg Hx     family hx   . Cancer Neg Hx     family hx prostate    Social History:  reports that he quit smoking about 28  years ago. He has never used smokeless tobacco. He reports that he does not drink alcohol or use illicit drugs.  Allergies: No Known Allergies  Medications: Not reviewed  Results for orders placed during the hospital encounter of 05/27/12 (from the past 48 hour(s))  CBC WITH DIFFERENTIAL     Status: Abnormal   Collection Time   05/27/12  9:43 AM      Component Value Range Comment   WBC 6.1  4.0 - 10.5 K/uL    RBC 4.41  4.22 - 5.81 MIL/uL    Hemoglobin 13.2  13.0 - 17.0 g/dL    HCT 09.8 (*) 11.9 - 52.0 %    MCV 88.0  78.0 - 100.0 fL    MCH 29.9  26.0 - 34.0 pg    MCHC 34.0  30.0 - 36.0 g/dL    RDW 14.7  82.9 - 56.2 %    Platelets 262  150 - 400 K/uL    Neutrophils Relative 65  43 - 77 %    Neutro Abs 3.9  1.7 - 7.7 K/uL    Lymphocytes Relative 16  12 - 46 %    Lymphs Abs 1.0  0.7 - 4.0 K/uL    Monocytes Relative 18 (*) 3 - 12 %    Monocytes Absolute 1.1 (*) 0.1 - 1.0 K/uL  Eosinophils Relative 1  0 - 5 %    Eosinophils Absolute 0.1  0.0 - 0.7 K/uL    Basophils Relative 0  0 - 1 %    Basophils Absolute 0.0  0.0 - 0.1 K/uL   BASIC METABOLIC PANEL     Status: Abnormal   Collection Time   05/27/12  9:43 AM      Component Value Range Comment   Sodium 134 (*) 135 - 145 mEq/L    Potassium 3.7  3.5 - 5.1 mEq/L    Chloride 99  96 - 112 mEq/L    CO2 28  19 - 32 mEq/L    Glucose, Bld 90  70 - 99 mg/dL    BUN 11  6 - 23 mg/dL    Creatinine, Ser 1.19  0.50 - 1.35 mg/dL    Calcium 9.2  8.4 - 14.7 mg/dL    GFR calc non Af Amer >90  >90 mL/min    GFR calc Af Amer >90  >90 mL/min     Mr Lumbar Spine W Wo Contrast  05/27/2012  *RADIOLOGY REPORT*  Clinical Data: Prior lumbar surgery.  New right leg pain and buttock/hip pain.  MRI LUMBAR SPINE WITHOUT AND WITH CONTRAST  Technique:  Multiplanar and multiecho pulse sequences of the lumbar spine were obtained without and with intravenous contrast.  Contrast: 20mL MULTIHANCE GADOBENATE DIMEGLUMINE 529 MG/ML IV SOLN  Comparison: 05/24/2012 no  plain film examination.  01/25/2012 MR.  Findings: Last fully open disc space is labeled L5-S1.  Present examination incorporates from T11-12 disc space through lower sacrum.  Penile prosthesis implant suspected with reservoir superior to the bladder.  Sub centimeter right renal cyst.  Conus T12 level.  3.5 mm sclerotic focus right ilium without change compared to the post myelogram CT on 07/18/2009.  T11-12 through L1-2 unremarkable.  L2-3:  Facet joint degenerative changes.  L3-4 through L5-S1 prior fusion without complication, significant spinal stenosis or foraminal narrowing noted.  IMPRESSION:  L3-4 through L5-S1 prior fusion without complication, significant spinal stenosis or foraminal narrowing noted.  L2-3 facet joint degenerative changes.  No evidence of right-sided disc herniation or nerve root compression noted  Tiny sclerotic focus right ilium stable.   Original Report Authenticated By: Fuller Canada, M.D.     Review of Systems  Constitutional: Negative.   HENT: Negative.   Eyes: Negative.   Respiratory: Negative.   Cardiovascular: Negative.   Gastrointestinal: Negative.   Genitourinary: Negative.   Musculoskeletal: Positive for joint pain.       Pain in right posterior region  Skin: Negative.   Endo/Heme/Allergies: Negative.    Blood pressure 121/80, pulse 102, temperature 98.5 F (36.9 C), temperature source Oral, resp. rate 18, SpO2 97.00%. Physical Exam  Constitutional: He is oriented to person, place, and time.  Neck: Normal range of motion. Neck supple.  Respiratory: Effort normal and breath sounds normal.  GI: Soft. Bowel sounds are normal.  Musculoskeletal:       Pain with external rotation of right hip pain and posterior aspect of trochanteric bursa  Neurological: He is alert and oriented to person, place, and time. He has normal reflexes.       Normal motor strength normal deep tendon reflexes in lower extremities  Skin: Skin is warm and dry.  Psychiatric: He has  a normal mood and affect. His behavior is normal. Judgment and thought content normal.    Assessment/Plan: Right posterior hip pain that is not likely related to his  lumbar spine suggests an MRI of the right hip and regards to location of his pain Toradol may be beneficial as the pain medication but at this time the patient cannot weight-bear on that right leg and suspicious of an intrinsic process in the hip itself. Despite normal x-ray on September 19  [name] J 05/27/2012, 7:29 PM

## 2012-05-27 NOTE — ED Notes (Signed)
Waiting for pt to arrive to exam room

## 2012-05-27 NOTE — ED Notes (Signed)
Pt in MRI - spouse at bedside

## 2012-05-27 NOTE — ED Provider Notes (Signed)
History     CSN: [id number]  Arrival date & time 05/27/12  7829   First MD Initiated Contact with Patient 05/27/12 [phone]      Chief Complaint  Patient presents with  . Back Pain    (Consider location/radiation/quality/duration/timing/severity/associated sxs/prior treatment) Patient is a 46 y.o. male presenting with back pain. The history is provided by the patient and the spouse.  Back Pain    patient here complaining of worsening chronic back pain for the past week. Seen in the Department 2 days ago for same placed on back pain protocol and given Iv hydromorphone. Patient called his neurosurgeon and complaint of worsening pain was told to come here. He did call the patient and some muscle relaxants which have not helped. He denies any change in his bowel or bladder function. Pain starts in his right flank and goes down his right leg. Denies dragging his foot at this time.  Past Medical History  Diagnosis Date  . B12 DEFICIENCY 03/06/2007  . CARDIOMYOPATHY, IDIOPATHIC HYPERTROPHIC 03/06/2007  . DEPRESSION 03/06/2007  . GERD 06/01/2007  . HYPERCHOLESTEROLEMIA 03/06/2007  . HYPERLIPIDEMIA 11/08/2008  . HYPERTENSION 03/06/2007  . IMPAIRED GLUCOSE TOLERANCE 11/08/2008  . NEPHROLITHIASIS, HX OF 04/07/2010  . 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 OSTEOARTHRITIS 06/01/2007  . Palpitations 10/23/2007  . SLEEP APNEA 03/06/2007  . SYNDROME, CHRONIC PAIN 03/06/2007  . Shortness of breath   . Cancer     prostate ca  . Prostate cancer     Past Surgical History  Procedure Date  . Decompression and fusion     cervicothoracic  . Partial nephrectomy   . Cardiac catheterization   . Lumbar fusion   . Prostate surgery     robotic prostatectomy  . Penile prosthesis implant     Family History  Problem Relation Age of Onset  . Heart disease Neg Hx     family hx CHF  . Kidney disease Neg Hx     family hx   . Cancer Neg Hx     family hx prostate    History  Substance Use Topics  . Smoking status:  Former Smoker    Quit date: 09/07/1983  . Smokeless tobacco: Never Used  . Alcohol Use: No      Review of Systems  Musculoskeletal: Positive for back pain.  All other systems reviewed and are negative.    Allergies  Review of patient's allergies indicates no known allergies.  Home Medications   Current Outpatient Rx  Name Route Sig Dispense Refill  . ALBUTEROL 90 MCG/ACT IN AERS Inhalation Inhale 2 puffs into the lungs every 6 (six) hours as needed for wheezing. 17 g 12  . ARTIFICIAL TEAR OP GEL Ophthalmic Apply 1 drop to eye at bedtime. For dry eye    . ATENOLOL 25 MG PO TABS Oral Take 25 mg by mouth 2 (two) times daily.    . ATORVASTATIN CALCIUM 40 MG PO TABS Oral Take 40 mg by mouth daily.    . AZATHIOPRINE 50 MG PO TABS Oral Take 50 mg by mouth 2 (two) times daily.     . BUPROPION HCL ER (XL) 300 MG PO TB24 Oral Take 1 tablet (300 mg total) by mouth every morning. 90 tablet 1    CYCLE FILL MEDICATION. Authorization is required f ...  . CYANOCOBALAMIN 1000 MCG/ML IJ SOLN Intramuscular Inject 1,000 mcg into the muscle every 30 (thirty) days.     . CYCLOSPORINE 0.05 % OP EMUL  1 drop 2 (two)  times daily.     Marland Kitchen DICYCLOMINE HCL 10 MG PO CAPS Oral Take 10 mg by mouth 4 (four) times daily - after meals and at bedtime.    . DULOXETINE HCL 30 MG PO CPEP Oral Take 30 mg by mouth daily.    . DULOXETINE HCL 60 MG PO CPEP Oral Take 60 mg by mouth daily.    Marland Kitchen ESOMEPRAZOLE MAGNESIUM 40 MG PO CPDR Oral Take 40 mg by mouth 2 (two) times daily.    Marland Kitchen GABAPENTIN 300 MG PO CAPS Oral Take 900 mg by mouth 4 (four) times daily.     . GUAIFENESIN ER 600 MG PO TB12 Oral Take 1,200 mg by mouth 2 (two) times daily.    Marland Kitchen HYDROMORPHONE HCL 4 MG PO TABS Oral Take 1 tablet (4 mg total) by mouth every 4 (four) hours as needed for pain. 30 tablet 0  . OXYBUTYNIN CHLORIDE 5 MG PO TABS Oral Take 1 tablet (5 mg total) by mouth at bedtime. 90 tablet 1  . OXYCODONE-ACETAMINOPHEN 10-325 MG PO TABS Oral Take 1  tablet by mouth every 6 (six) hours as needed. pain    . PREDNISONE 5 MG PO TABS Oral Take 5 mg by mouth daily.     Marland Kitchen PROMETHAZINE HCL 25 MG PO TABS Oral Take 25 mg by mouth every 6 (six) hours as needed. For nausea    . TRAMADOL HCL 50 MG PO TABS Oral Take 1 tablet (50 mg total) by mouth every 6 (six) hours as needed for pain. 180 tablet 0    CYCLE FILL MEDICATION. Authorization is required f ...    BP 131/76  Pulse 93  Temp 98.5 F (36.9 C) (Oral)  Resp 16  SpO2 96%  Physical Exam  Nursing note and vitals reviewed. Constitutional: He is oriented to person, place, and time. He appears well-developed and well-nourished.  Non-toxic appearance. No distress.  HENT:  Head: Normocephalic and atraumatic.  Eyes: Conjunctivae normal, EOM and lids are normal. Pupils are equal, round, and reactive to light.  Neck: Normal range of motion. Neck supple. No tracheal deviation present. No mass present.  Cardiovascular: Normal rate, regular rhythm and normal heart sounds.  Exam reveals no gallop.   No murmur heard. Pulmonary/Chest: Effort normal and breath sounds normal. No stridor. No respiratory distress. He has no decreased breath sounds. He has no wheezes. He has no rhonchi. He has no rales.  Abdominal: Soft. Normal appearance and bowel sounds are normal. He exhibits no distension. There is no tenderness. There is no rebound and no CVA tenderness.  Musculoskeletal: Normal range of motion. He exhibits no edema and no tenderness.       Arms: Neurological: He is alert and oriented to person, place, and time. He has normal strength. He displays normal reflexes. No cranial nerve deficit or sensory deficit. GCS eye subscore is 4. GCS verbal subscore is 5. GCS motor subscore is 6.  Reflex Scores:      Patellar reflexes are 1+ on the right side. Skin: Skin is warm and dry. No abrasion and no rash noted.  Psychiatric: He has a normal mood and affect. His speech is normal and behavior is normal.    ED  Course  Procedures (including critical care time)  Labs Reviewed - No data to display No results found.   No diagnosis found.    MDM  Patient given pain medication for his back pain. The plan is to repeat MRI and he will be sent  to the CDU and the CDU at level will speak with the neurosurgeon on call        Toy Baker, MD 05/27/12 1316

## 2012-05-27 NOTE — ED Notes (Signed)
Report received at 1340 - Spoke w/Jamal, MRI, who advised pt is on his way to MRI and will bring pt to CDU #10 when complete.

## 2012-05-27 NOTE — ED Notes (Signed)
Report given to Jody Oberthaler, RN 

## 2012-05-28 ENCOUNTER — Observation Stay (HOSPITAL_COMMUNITY): Payer: Medicare Other

## 2012-05-28 DIAGNOSIS — I10 Essential (primary) hypertension: Secondary | ICD-10-CM

## 2012-05-28 DIAGNOSIS — R52 Pain, unspecified: Secondary | ICD-10-CM

## 2012-05-28 DIAGNOSIS — M25459 Effusion, unspecified hip: Secondary | ICD-10-CM | POA: Diagnosis not present

## 2012-05-28 DIAGNOSIS — D869 Sarcoidosis, unspecified: Secondary | ICD-10-CM | POA: Diagnosis not present

## 2012-05-28 DIAGNOSIS — M25559 Pain in unspecified hip: Secondary | ICD-10-CM | POA: Diagnosis not present

## 2012-05-28 LAB — BASIC METABOLIC PANEL
BUN: 12 mg/dL (ref 6–23)
CO2: 28 mEq/L (ref 19–32)
Calcium: 9.4 mg/dL (ref 8.4–10.5)
Chloride: 101 mEq/L (ref 96–112)
Creatinine, Ser: 1 mg/dL (ref 0.50–1.35)
GFR calc Af Amer: >90 mL/min (ref >90)
GFR calc non Af Amer: 88 mL/min — ABNORMAL LOW (ref >90)
Glucose, Bld: 88 mg/dL (ref 70–99)
Potassium: 4.1 mEq/L (ref 3.5–5.1)
Sodium: 138 mEq/L (ref 135–145)

## 2012-05-28 LAB — PSA: PSA: <0.01 ng/mL — ABNORMAL LOW (ref <=4.00)

## 2012-05-28 LAB — CBC
HCT: 38.7 % — ABNORMAL LOW (ref 39.0–52.0)
Hemoglobin: 13 g/dL (ref 13.0–17.0)
MCH: 29.7 pg (ref 26.0–34.0)
MCHC: 33.6 g/dL (ref 30.0–36.0)
MCV: 88.4 fL (ref 78.0–100.0)
Platelets: 260 10*3/uL (ref 150–400)
RBC: 4.38 MIL/uL (ref 4.22–5.81)
RDW: 13.7 % (ref 11.5–15.5)
WBC: 4.8 10*3/uL (ref 4.0–10.5)

## 2012-05-28 LAB — GLUCOSE, CAPILLARY: Glucose-Capillary: 90 mg/dL (ref 70–99)

## 2012-05-28 MED ORDER — ARTIFICIAL TEARS OP OINT
TOPICAL_OINTMENT | Freq: Every day | OPHTHALMIC | Status: DC
  Administered 2012-05-28 – 2012-05-29 (×2): via OPHTHALMIC
  Filled 2012-05-28: qty 3.5

## 2012-05-28 MED ORDER — KETOROLAC TROMETHAMINE 30 MG/ML IJ SOLN
INTRAMUSCULAR | Status: AC
  Administered 2012-05-28: 15 mg
  Filled 2012-05-28: qty 1

## 2012-05-28 MED ORDER — OXYCODONE-ACETAMINOPHEN 5-325 MG PO TABS
1.0000 | ORAL_TABLET | Freq: Four times a day (QID) | ORAL | Status: DC | PRN
  Administered 2012-05-28 – 2012-05-30 (×7): 1 via ORAL
  Filled 2012-05-28 (×3): qty 1
  Filled 2012-05-28: qty 2
  Filled 2012-05-28 (×3): qty 1

## 2012-05-28 MED ORDER — DULOXETINE HCL 60 MG PO CPEP
90.0000 mg | ORAL_CAPSULE | Freq: Every day | ORAL | Status: DC
  Administered 2012-05-28 – 2012-05-30 (×3): 90 mg via ORAL
  Filled 2012-05-28 (×3): qty 1

## 2012-05-28 MED ORDER — SENNA 8.6 MG PO TABS
1.0000 | ORAL_TABLET | Freq: Two times a day (BID) | ORAL | Status: DC
  Administered 2012-05-28 – 2012-05-30 (×4): 8.6 mg via ORAL
  Filled 2012-05-28 (×7): qty 1

## 2012-05-28 MED ORDER — INFLUENZA VIRUS VACC SPLIT PF IM SUSP
0.5000 mL | INTRAMUSCULAR | Status: AC
  Filled 2012-05-28: qty 0.5

## 2012-05-28 MED ORDER — AMITRIPTYLINE HCL 25 MG PO TABS
25.0000 mg | ORAL_TABLET | Freq: Every day | ORAL | Status: DC
  Administered 2012-05-28 – 2012-05-29 (×2): 25 mg via ORAL
  Filled 2012-05-28 (×3): qty 1

## 2012-05-28 MED ORDER — KETOROLAC TROMETHAMINE 15 MG/ML IJ SOLN
15.0000 mg | Freq: Three times a day (TID) | INTRAMUSCULAR | Status: DC
  Administered 2012-05-28 – 2012-05-30 (×6): 15 mg via INTRAVENOUS
  Filled 2012-05-28 (×9): qty 1

## 2012-05-28 MED ORDER — ALBUTEROL SULFATE HFA 108 (90 BASE) MCG/ACT IN AERS
2.0000 | INHALATION_SPRAY | Freq: Four times a day (QID) | RESPIRATORY_TRACT | Status: DC | PRN
  Filled 2012-05-28: qty 6.7

## 2012-05-28 MED ORDER — HYDROMORPHONE HCL PF 1 MG/ML IJ SOLN
1.0000 mg | INTRAMUSCULAR | Status: DC | PRN
  Administered 2012-05-28 – 2012-05-30 (×11): 1 mg via INTRAVENOUS
  Filled 2012-05-28 (×11): qty 1

## 2012-05-28 MED ORDER — OXYCODONE HCL 5 MG PO TABS
5.0000 mg | ORAL_TABLET | Freq: Four times a day (QID) | ORAL | Status: DC | PRN
  Administered 2012-05-28 – 2012-05-30 (×5): 5 mg via ORAL
  Filled 2012-05-28 (×5): qty 1

## 2012-05-28 MED ORDER — DOCUSATE SODIUM 100 MG PO CAPS
100.0000 mg | ORAL_CAPSULE | Freq: Two times a day (BID) | ORAL | Status: DC
  Administered 2012-05-28 – 2012-05-30 (×5): 100 mg via ORAL
  Filled 2012-05-28 (×6): qty 1

## 2012-05-28 MED ORDER — DIAZEPAM 5 MG PO TABS
5.0000 mg | ORAL_TABLET | Freq: Once | ORAL | Status: AC
  Administered 2012-05-28: 5 mg via ORAL
  Filled 2012-05-28: qty 1

## 2012-05-28 NOTE — Progress Notes (Signed)
Discussed MRI results with Dr. Danielle Dess. MRI right hip reveals increased size and mild increased urination since 07/08/2009, small amount of fluid, and right trochanteric bursitis. Results were discussed with the patient and his wife at the bedside. I suggested an orthopedic consult, and the patient requested to be seen by Dr. Salvatore Marvel or Dr. Eulah Pont. I tried to page through the answering service x2. There was no return call. I told patient that I will try to call them again in the morning for an orthopedic opinion. Patient understands and agrees. Trial of Toradol.

## 2012-05-28 NOTE — ED Provider Notes (Signed)
Medical screening examination/treatment/procedure(s) were conducted as a shared visit with non-physician practitioner(s) and myself.  I personally evaluated the patient during the encounter.  Patient unable to ambulate. Tender over right lateral hip.  Admit to hospitalist  Donnetta Hutching, MD 05/28/12 1752

## 2012-05-28 NOTE — Progress Notes (Signed)
[facility] PROGRESS NOTE  OVIDE DUSEK ZOX:[id number] DOB: 09-02-1966 DOA: 05/27/2012 PCP: Rogelia Boga, MD  Assessment/Plan: Right hip pain -Pain appears to be out of proportion with physical examination findings -Concerned about avascular necrosis due to chronic prednisone use -Doubt bursitis versus septic arthritis -Cannot rule out metastatic disease from prostate -Pain is requiring intravenous opioids -Await MRI hip -Start intravenous hydromorphone Neurosarcoidosis -Continue Imuran, prednisone Hypertension -Continue the atenolol Depression/anxiety -Continue Wellbutrin, Cymbalta Hyperlipidemia -Continue Lipitor   Active Problems:  Abnormal PSA  Hip pain, acute    Family Communication:   Pt at beside Disposition Plan:   Home when medically stable      Procedures/Studies: Dg Lumbar Spine Complete  05/24/2012  *RADIOLOGY REPORT*  Clinical Data: Severe low back pain for 3 weeks.  History of prior surgery.  LUMBAR SPINE - COMPLETE 4+ VIEW  Comparison: Plain films lumbar spine 01/14/2012 and MRI 01/25/2012.  Findings: Again seen is postoperative change of L3-S1 fusion with pedicle screws and stabilization bars in place.  Hardware is intact.  Vertebral body height and alignment are maintained. Paraspinous structures are unremarkable.  IMPRESSION: No acute finding.  Status post L3-S1 fusion.   Original Report Authenticated By: Bernadene Bell. D'ALESSIO, M.D.    Dg Hip Complete Right  05/25/2012  *RADIOLOGY REPORT*  Clinical Data: Pain and stiffness.  RIGHT HIP - COMPLETE 2+ VIEW  Comparison: Plain films 06/20/2009.  Findings: Both hips are located.  No evidence of avascular necrosis is identified.  There is no fracture.  The patient is status post lower lumbar fusion.  Penile prosthesis is noted.  No notable degenerative change about the hips.  IMPRESSION: No acute finding or focal abnormality.   Original Report Authenticated By: Bernadene Bell. Maricela Curet, M.D.    Mr  Lumbar Spine W Wo Contrast  05/27/2012  *RADIOLOGY REPORT*  Clinical Data: Prior lumbar surgery.  New right leg pain and buttock/hip pain.  MRI LUMBAR SPINE WITHOUT AND WITH CONTRAST  Technique:  Multiplanar and multiecho pulse sequences of the lumbar spine were obtained without and with intravenous contrast.  Contrast: 20mL MULTIHANCE GADOBENATE DIMEGLUMINE 529 MG/ML IV SOLN  Comparison: 05/24/2012 no plain film examination.  01/25/2012 MR.  Findings: Last fully open disc space is labeled L5-S1.  Present examination incorporates from T11-12 disc space through lower sacrum.  Penile prosthesis implant suspected with reservoir superior to the bladder.  Sub centimeter right renal cyst.  Conus T12 level.  3.5 mm sclerotic focus right ilium without change compared to the post myelogram CT on 07/18/2009.  T11-12 through L1-2 unremarkable.  L2-3:  Facet joint degenerative changes.  L3-4 through L5-S1 prior fusion without complication, significant spinal stenosis or foraminal narrowing noted.  IMPRESSION:  L3-4 through L5-S1 prior fusion without complication, significant spinal stenosis or foraminal narrowing noted.  L2-3 facet joint degenerative changes.  No evidence of right-sided disc herniation or nerve root compression noted  Tiny sclerotic focus right ilium stable.   Original Report Authenticated By: Fuller Canada, M.D.          Subjective: Patient states that pain is not well-controlled with oral opioids. Requests intravenous opioids. Denies any headache, dizziness, chest pain, shortness breath, nausea, vomiting, diarrhea, abdominal pain, dysuria, fevers, chills.  Objective: Filed Vitals:   05/27/12 2100 05/27/12 2354 05/28/12 0013 05/28/12 0647  BP: 115/65 121/55 121/80 128/84  Pulse: 113 103 100 79  Temp:  98.4 F (36.9 C) 98.2 F (36.8 C) 98.9 F (37.2 C)  TempSrc:   Oral Oral  Resp:  8 14 18   Height:   5\' 9"  (1.753 m)   Weight:   104.4 kg (230 lb 2.6 oz)   SpO2: 98% 100% 99% 95%     Intake/Output Summary (Last 24 hours) at 05/28/12 0910 Last data filed at 05/28/12 0125  Gross per 24 hour  Intake   1480 ml  Output    500 ml  Net    980 ml   Weight change:  Exam:   General:  Pt is alert, follows commands appropriately, not in acute distress  HEENT: No icterus, No thrush, No neck mass, [place]/AT  Cardiovascular: RRR, S1/S2, no rubs, no gallops; 2/6 systolic murmur left upper sternal border  Respiratory: Scattered basilar rales. No wheezes. Good air movement.  Abdomen: Soft/+BS, non tender, non distended, no guarding  Extremities: No edema, No lymphangitis,  No rashes, no synovitis; right hip without any erythema, edema, warmth. Minimal tenderness to palpation of the greater trochanter on the right  Data Reviewed: Basic Metabolic Panel:  Lab 05/28/12 6962 05/27/12 2310 05/27/12 0943 05/25/12 0231  NA 138 -- 134* 136  K 4.1 -- 3.7 3.9  CL 101 -- 99 102  CO2 28 -- 28 25  GLUCOSE 88 -- 90 108*  BUN 12 -- 11 13  CREATININE 1.00 0.93 0.96 1.01  CALCIUM 9.4 -- 9.2 9.4  MG -- -- -- --  PHOS -- -- -- --   Liver Function Tests: No results found for this basename: AST:5,ALT:5,ALKPHOS:5,BILITOT:5,PROT:5,ALBUMIN:5 in the last 168 hours No results found for this basename: LIPASE:5,AMYLASE:5 in the last 168 hours No results found for this basename: AMMONIA:5 in the last 168 hours CBC:  Lab 05/28/12 0605 05/27/12 2310 05/27/12 0943 05/25/12 0231  WBC 4.8 5.2 6.1 4.6  NEUTROABS -- -- 3.9 --  HGB 13.0 12.8* 13.2 13.8  HCT 38.7* 37.5* 38.8* 39.5  MCV 88.4 87.6 88.0 86.4  PLT 260 249 262 271   Cardiac Enzymes: No results found for this basename: CKTOTAL:5,CKMB:5,CKMBINDEX:5,TROPONINI:5 in the last 168 hours BNP: No components found with this basename: POCBNP:5 CBG:  Lab 05/28/12 0702  GLUCAP 90    No results found for this or any previous visit (from the past 240 hour(s)).   Scheduled Meds:   . artificial tears   Both Eyes QHS  . atenolol  25 mg  Oral BID  . atorvastatin  40 mg Oral q1800  . azaTHIOprine  50 mg Oral BID  . buPROPion  300 mg Oral Daily  . cyanocobalamin  1,000 mcg Intramuscular Q30 days  . cycloSPORINE  1 drop Both Eyes BID  . diazepam  5 mg Oral Once  . dicyclomine  10 mg Oral TID PC & HS  . docusate sodium  100 mg Oral BID  . DULoxetine  90 mg Oral Daily  . gabapentin  900 mg Oral QID  . guaiFENesin  1,200 mg Oral BID  . heparin  5,000 Units Subcutaneous Q8H  . HYDROmorphone  2 mg Intravenous Once  . HYDROmorphone  2 mg Intravenous Once  . influenza  inactive virus vaccine  0.5 mL Intramuscular Tomorrow-1000  . ketorolac  30 mg Intravenous Once  . LORazepam  1 mg Intravenous Once  . ondansetron  4 mg Intravenous Once  . oxybutynin  5 mg Oral QHS  . pantoprazole  80 mg Oral Q1200  . predniSONE  5 mg Oral Q breakfast  . senna  1 tablet Oral BID  . DISCONTD: Artificial Tear  1 drop Ophthalmic QHS  .  DISCONTD: DULoxetine  30 mg Oral Daily  . DISCONTD: DULoxetine  60 mg Oral Daily   Continuous Infusions:   . sodium chloride 20 mL/hr (05/28/12 0125)  . DISCONTD: sodium chloride 125 mL/hr at 05/27/12 1948     Pinchas Reither, DO  [facility] Pager [phone]  If 7PM-7AM, please contact night-coverage [email or web address] Password [email or web address] 05/28/2012, 9:10 AM   LOS: 1 day

## 2012-05-29 DIAGNOSIS — F329 Major depressive disorder, single episode, unspecified: Secondary | ICD-10-CM | POA: Diagnosis not present

## 2012-05-29 DIAGNOSIS — D869 Sarcoidosis, unspecified: Secondary | ICD-10-CM | POA: Diagnosis not present

## 2012-05-29 DIAGNOSIS — G894 Chronic pain syndrome: Secondary | ICD-10-CM | POA: Diagnosis not present

## 2012-05-29 DIAGNOSIS — M549 Dorsalgia, unspecified: Secondary | ICD-10-CM | POA: Diagnosis not present

## 2012-05-29 DIAGNOSIS — F411 Generalized anxiety disorder: Secondary | ICD-10-CM | POA: Diagnosis not present

## 2012-05-29 DIAGNOSIS — I10 Essential (primary) hypertension: Secondary | ICD-10-CM | POA: Diagnosis not present

## 2012-05-29 DIAGNOSIS — R972 Elevated prostate specific antigen [PSA]: Secondary | ICD-10-CM | POA: Diagnosis not present

## 2012-05-29 DIAGNOSIS — Z23 Encounter for immunization: Secondary | ICD-10-CM | POA: Diagnosis not present

## 2012-05-29 DIAGNOSIS — M5430 Sciatica, unspecified side: Secondary | ICD-10-CM | POA: Diagnosis not present

## 2012-05-29 DIAGNOSIS — E785 Hyperlipidemia, unspecified: Secondary | ICD-10-CM | POA: Diagnosis not present

## 2012-05-29 DIAGNOSIS — M25559 Pain in unspecified hip: Secondary | ICD-10-CM | POA: Diagnosis not present

## 2012-05-29 DIAGNOSIS — IMO0002 Reserved for concepts with insufficient information to code with codable children: Secondary | ICD-10-CM | POA: Diagnosis not present

## 2012-05-29 LAB — CBC
HCT: 39 % (ref 39.0–52.0)
Hemoglobin: 13.5 g/dL (ref 13.0–17.0)
MCH: 29.7 pg (ref 26.0–34.0)
MCHC: 34.6 g/dL (ref 30.0–36.0)
MCV: 85.7 fL (ref 78.0–100.0)
Platelets: 276 10*3/uL (ref 150–400)
RBC: 4.55 MIL/uL (ref 4.22–5.81)
RDW: 13.3 % (ref 11.5–15.5)
WBC: 4 10*3/uL (ref 4.0–10.5)

## 2012-05-29 LAB — BASIC METABOLIC PANEL
BUN: 13 mg/dL (ref 6–23)
CO2: 25 mEq/L (ref 19–32)
Calcium: 9.5 mg/dL (ref 8.4–10.5)
Chloride: 101 mEq/L (ref 96–112)
Creatinine, Ser: 0.95 mg/dL (ref 0.50–1.35)
GFR calc Af Amer: >90 mL/min (ref >90)
GFR calc non Af Amer: >90 mL/min (ref >90)
Glucose, Bld: 95 mg/dL (ref 70–99)
Potassium: 3.9 mEq/L (ref 3.5–5.1)
Sodium: 136 mEq/L (ref 135–145)

## 2012-05-29 MED ORDER — METHYLPREDNISOLONE ACETATE 80 MG/ML IJ SUSP
80.0000 mg | Freq: Once | INTRAMUSCULAR | Status: DC
  Filled 2012-05-29: qty 1

## 2012-05-29 MED ORDER — BUPIVACAINE HCL (PF) 0.5 % IJ SOLN
6.0000 mL | Freq: Once | INTRAMUSCULAR | Status: DC
  Filled 2012-05-29: qty 10

## 2012-05-29 MED ORDER — LIDOCAINE HCL (PF) 1 % IJ SOLN
3.0000 mL | Freq: Once | INTRAMUSCULAR | Status: DC
  Filled 2012-05-29: qty 4

## 2012-05-29 NOTE — Progress Notes (Signed)
Nutrition Brief Note  Patient identified on the Malnutrition Screening Tool (MST) report for wt loss, generating a score of 3.   Body mass index is 33.99 kg/(m^2). Pt meets criteria for obese class 1 based on current BMI.   Current diet order is Regular, patient is consuming approximately 25-100% of meals at this time. Labs and medications reviewed.   Pt states that he has lost ~50 lbs intentionally over the past year.  Review of chart indicates pt has lost ~30 lbs over the past 3 years and wt has been stable at ~230 lbs over the past year.  No nutrition interventions warranted at this time. If nutrition issues arise, please consult RD.   Loyce Dys, MS RD LDN Clinical Inpatient Dietitian Pager: [phone] Weekend/After hours pager: [phone]

## 2012-05-29 NOTE — ED Provider Notes (Signed)
Medical screening examination/treatment/procedure(s) were performed by non-physician practitioner and as supervising physician I was immediately available for consultation/collaboration.   Latoi Giraldo E Malone Vanblarcom, MD 05/29/12 0841 

## 2012-05-29 NOTE — Progress Notes (Signed)
[facility] PROGRESS NOTE  [name] ZOX:[id number] DOB: 09/16/1965 DOA: 05/27/2012 PCP: John Boga, MD  Assessment/Plan:  Right hip pain  -Pain appears to be out of proportion with physical examination findings  -Discussed case with [name], orthopedics who did not feel any further intervention is needed for pt hip pain--did not feel bursitis was causing the pain -Patient appears comfortable during my exam and interview. In fact, patient nearly fell asleep during my conversation with his wife at the bedside.  -He is requesting PMR consult for possible TENS unit and other interventions -I suggested physical therapy, but he appears to be somewhat hesitant to participate  -Case was discussed with neurosurgery, [name], who did not feel any surgery is warranted at this time -Pain is requiring intravenous opioids  -MRI hip showed mild bursitis and mild erosion, it was discussed with [name]  -Start intravenous hydromorphone  -I offered to switch the patient from gabapentin to Lyrica. He states that it causes some type of problem with his other medications.  -Patient appears to be very fixated on wanting some type of surgical intervention rather than trying to participate in physical therapy Neurosarcoidosis  -Continue Imuran, prednisone  Hypertension  -Continue the atenolol  Depression/anxiety  -Continue Wellbutrin, Cymbalta  Hyperlipidemia  -Continue Lipitor     Family Communication:  Wife at beside Disposition Plan:   Home when medically stable       Procedures/Studies: Dg Lumbar Spine Complete  05/24/2012  *RADIOLOGY REPORT*  Clinical Data: Severe low back pain for 3 weeks.  History of prior surgery.  LUMBAR SPINE - COMPLETE 4+ VIEW  Comparison: Plain films lumbar spine 01/14/2012 and MRI 01/25/2012.  Findings: Again seen is postoperative change of L3-S1 fusion with pedicle screws and stabilization bars in place.  Hardware is intact.   Vertebral body height and alignment are maintained. Paraspinous structures are unremarkable.  IMPRESSION: No acute finding.  Status post L3-S1 fusion.   Original Report Authenticated By: [name], M.D.    Dg Hip Complete Right  05/25/2012  *RADIOLOGY REPORT*  Clinical Data: Pain and stiffness.  RIGHT HIP - COMPLETE 2+ VIEW  Comparison: Plain films 06/20/2009.  Findings: Both hips are located.  No evidence of avascular necrosis is identified.  There is no fracture.  The patient is status post lower lumbar fusion.  Penile prosthesis is noted.  No notable degenerative change about the hips.  IMPRESSION: No acute finding or focal abnormality.   Original Report Authenticated By: John Bell. [name], M.D.    Mr Lumbar Spine W Wo Contrast  05/27/2012  *RADIOLOGY REPORT*  Clinical Data: Prior lumbar surgery.  New right leg pain and buttock/hip pain.  MRI LUMBAR SPINE WITHOUT AND WITH CONTRAST  Technique:  Multiplanar and multiecho pulse sequences of the lumbar spine were obtained without and with intravenous contrast.  Contrast: 20mL MULTIHANCE GADOBENATE DIMEGLUMINE 529 MG/ML IV SOLN  Comparison: 05/24/2012 no plain film examination.  01/25/2012 MR.  Findings: Last fully open disc space is labeled L5-S1.  Present examination incorporates from T11-12 disc space through lower sacrum.  Penile prosthesis implant suspected with reservoir superior to the bladder.  Sub centimeter right renal cyst.  Conus T12 level.  3.5 mm sclerotic focus right ilium without change compared to the post myelogram CT on 07/18/2009.  T11-12 through L1-2 unremarkable.  L2-3:  Facet joint degenerative changes.  L3-4 through L5-S1 prior fusion without complication, significant spinal stenosis or foraminal narrowing noted.  IMPRESSION:  L3-4 through L5-S1  prior fusion without complication, significant spinal stenosis or foraminal narrowing noted.  L2-3 facet joint degenerative changes.  No evidence of right-sided disc herniation or  nerve root compression noted  Tiny sclerotic focus right ilium stable.   Original Report Authenticated By: [name], M.D.    Mr Hip Right Wo Contrast  05/28/2012  *RADIOLOGY REPORT*  Clinical Data: Right hip pain.  Low back pain.  MRI OF THE RIGHT HIP WITHOUT CONTRAST  Technique:  Multiplanar, multisequence MR imaging was performed. No intravenous contrast was administered.  Comparison: 05/25/2012; 07/08/2009  Findings: Degenerative or erosive appearing lesion along the lateral margin of the right femoral head observed, increased in size and conspicuity compared the prior exam.  Amount of fluid in both hip joints is in the upper normal range.  No sciatic notch impingement or asymmetric sciatic nerve edema.  No obturator impingement.  Hip adductor musculature appears normal and symmetric.  There is minimal right-sided trochanteric bursitis.  Interval prostatectomy noted.  Penile implant in place.  Right external iliac node measures 10 mm in short axis, borderline prominent.  On the prior exam from 2010 there is an appearance suggesting partial avulsion along the left hamstring origination site; this has resolved.  No free pelvic fluid.  Trace high signal noted along the pubic symphysis without adjacent aponeurotic detachment.  Oblique sagittal images demonstrate poor signal noise ratio.  IMPRESSION:  1.  Mild increase in size of degenerative or erosive lesion along the anterolateral femoral head compared to 07/08/2009. 2.  Amount of fluid in the hip joint is at the upper normal range. 3.  Trace right trochanteric bursitis. 4.  Prostatectomy and penile implant. 5.  No sciatic notch or obturator impingement observed. 6.  Borderline prominent right external iliac lymph node.  I discussed these findings by telephone with [name] at to 2:22 p.m. on 05/28/2012.   Original Report Authenticated By: [name], M.D.          Subjective:  patient appears comfortable. However he continues to  complain of right hip pain not controlled by the hydromorphone. However the patient fell asleep during my discussion with his wife at the bedside. He denies any chest pain, shortness breath, nausea, vomiting, diarrhea,, fevers, chills, dizziness.   Objective: Filed Vitals:   05/28/12 1412 05/28/12 2019 05/29/12 0539 05/29/12 1513  BP: 132/73 142/83 118/70 121/70  Pulse: 91 94 82 83  Temp: 98.6 F (37 C) 98 F (36.7 C) 98.2 F (36.8 C) 97.9 F (36.6 C)  TempSrc: Oral Oral Oral   Resp: 18 18 18 18   Height:      Weight:      SpO2: 97% 95% 97% 97%    Intake/Output Summary (Last 24 hours) at 05/29/12 1736 Last data filed at 05/29/12 1500  Gross per 24 hour  Intake 444.67 ml  Output   1525 ml  Net -1080.33 ml   Weight change:  Exam:   General:  Pt is alert, follows commands appropriately, not in acute distress    Cardiovascular: RRR, S1/S2, no rubs, no gallops  Respiratory: Clear to auscultation bilaterally, no wheezing, no crackles, no rhonchi  Abdomen: Soft/+BS, non tender, non distended, no guarding  Extremities:  right leg --No edema, No lymphangitis, No petechiae, No rashes, no synovitis  Data Reviewed: Basic Metabolic Panel:  Lab 05/29/12 8119 05/28/12 0605 05/27/12 2310 05/27/12 0943 05/25/12 0231  NA 136 138 -- 134* 136  K 3.9 4.1 -- 3.7 3.9  CL 101 101 --  99 102  CO2 25 28 -- 28 25  GLUCOSE 95 88 -- 90 108*  BUN 13 12 -- 11 13  CREATININE 0.95 1.00 0.93 0.96 1.01  CALCIUM 9.5 9.4 -- 9.2 9.4  MG -- -- -- -- --  PHOS -- -- -- -- --   Liver Function Tests: No results found for this basename: AST:5,ALT:5,ALKPHOS:5,BILITOT:5,PROT:5,ALBUMIN:5 in the last 168 hours No results found for this basename: LIPASE:5,AMYLASE:5 in the last 168 hours No results found for this basename: AMMONIA:5 in the last 168 hours CBC:  Lab 05/29/12 0500 05/28/12 0605 05/27/12 2310 05/27/12 0943 05/25/12 0231  WBC 4.0 4.8 5.2 6.1 4.6  NEUTROABS -- -- -- 3.9 --  HGB 13.5 13.0  12.8* 13.2 13.8  HCT 39.0 38.7* 37.5* 38.8* 39.5  MCV 85.7 88.4 87.6 88.0 86.4  PLT 276 260 249 262 271   Cardiac Enzymes: No results found for this basename: CKTOTAL:5,CKMB:5,CKMBINDEX:5,TROPONINI:5 in the last 168 hours BNP: No components found with this basename: POCBNP:5 CBG:  Lab 05/28/12 0702  GLUCAP 90    No results found for this or any previous visit (from the past 240 hour(s)).   Scheduled Meds:   . amitriptyline  25 mg Oral QHS  . artificial tears   Both Eyes QHS  . atenolol  25 mg Oral BID  . atorvastatin  40 mg Oral q1800  . azaTHIOprine  50 mg Oral BID  . bupivacaine  6 mL Infiltration Once  . buPROPion  300 mg Oral Daily  . cyanocobalamin  1,000 mcg Intramuscular Q30 days  . cycloSPORINE  1 drop Both Eyes BID  . dicyclomine  10 mg Oral TID PC & HS  . docusate sodium  100 mg Oral BID  . DULoxetine  90 mg Oral Daily  . gabapentin  900 mg Oral QID  . guaiFENesin  1,200 mg Oral BID  . heparin  5,000 Units Subcutaneous Q8H  . influenza  inactive virus vaccine  0.5 mL Intramuscular Tomorrow-1000  . ketorolac  15 mg Intravenous Q8H  . lidocaine  3 mL Other Once  . methylPREDNISolone acetate  80 mg Intra-articular Once  . oxybutynin  5 mg Oral QHS  . pantoprazole  80 mg Oral Q1200  . predniSONE  5 mg Oral Q breakfast  . senna  1 tablet Oral BID   Continuous Infusions:   . sodium chloride 20 mL/hr at 05/29/12 1415     Berdell Nevitt, DO  [facility] Pager [phone]  If 7PM-7AM, please contact night-coverage [email or web address] Password [facility] 05/29/2012, 5:36 PM   LOS: 2 days

## 2012-05-29 NOTE — Progress Notes (Signed)
Utilization review completed. Cashtyn Pouliot, RN, BSN. 

## 2012-05-30 DIAGNOSIS — M549 Dorsalgia, unspecified: Secondary | ICD-10-CM | POA: Diagnosis not present

## 2012-05-30 DIAGNOSIS — G894 Chronic pain syndrome: Secondary | ICD-10-CM | POA: Diagnosis not present

## 2012-05-30 DIAGNOSIS — D869 Sarcoidosis, unspecified: Secondary | ICD-10-CM | POA: Diagnosis not present

## 2012-05-30 DIAGNOSIS — M25559 Pain in unspecified hip: Secondary | ICD-10-CM | POA: Diagnosis not present

## 2012-05-30 MED ORDER — INFLUENZA VIRUS VACC SPLIT PF IM SUSP
0.5000 mL | Freq: Once | INTRAMUSCULAR | Status: AC
  Administered 2012-05-30: 0.5 mL via INTRAMUSCULAR
  Filled 2012-05-30: qty 0.5

## 2012-05-30 MED ORDER — AMITRIPTYLINE HCL 25 MG PO TABS: 25.0000 mg | ORAL_TABLET | Freq: Every day | ORAL | Status: DC

## 2012-05-30 NOTE — Progress Notes (Signed)
CARE MANAGEMENT NOTE 05/30/2012  Patient:  KYMIR, EMIGH   Account Number:  [id number]  Date Initiated:  05/30/2012  Documentation initiated by:  Vance Peper  Subjective/Objective Assessment:   46 yr old male admitted for right hip hip and back pain.     Action/Plan:   CM spoke with patient regarding home health and DME needs.   Anticipated DC Date:  05/30/2012   Anticipated DC Plan:  HOME W HOME HEALTH SERVICES      DC Planning Services  CM consult      PAC Choice  DURABLE MEDICAL EQUIPMENT  HOME HEALTH   Choice offered to / List presented to:  C-1 Patient   DME arranged  Levan Hurst      DME agency  [facility]     HH arranged  HH-2 PT  HH-4 NURSE'S AIDE      HH agency  [facility]   Status of service:  Completed, signed off Medicare Important Message given?   (If response is "NO", the following Medicare IM given date fields will be blank) Date Medicare IM given:   Date Additional Medicare IM given:    Discharge Disposition:  HOME W HOME HEALTH SERVICES  Per UR Regulation:    If discussed at Long Length of Stay Meetings, dates discussed:    Comments:

## 2012-05-30 NOTE — Discharge Summary (Signed)
Physician Discharge Summary  [name]:[id number] DOB: [birth year] DOA: 05/27/2012  PCP: Rogelia Boga, MD  Admit date: 05/27/2012 Discharge date: 05/30/2012  Recommendations for Outpatient Follow-up:  1. Pt will need to follow up with PCP in 2-3 weeks post discharge 2. Follow up Dr. Callie Fielding, pain management as outpt ASAP, patient to call for appt.  [phone]  Discharge Diagnoses:  Right hip pain  -Pain appears to be out of proportion with physical examination findings  -Discussed case with Dr. Mckinley Jewel, orthopedics who did not feel any further intervention is needed for pt hip pain--did not feel bursitis was causing the pain  -Patient appears comfortable during my exam and interview. In fact, patient nearly fell asleep during my conversation with his wife at the bedside.  -He is requesting PMR consult for possible internal spinal stimulator unit and other interventions  -I suggested physical therapy, but he appears to be somewhat hesitant to participate  -Case was discussed with neurosurgery, Dr. Danielle Dess, who did not feel any surgery is warranted at this time  -MRI hip showed mild bursitis and mild erosion, it was discussed with Dr. Eulah Pont  -Start intravenous hydromorphone  -I offered to switch the patient from gabapentin to Lyrica. He does not want this. -Patient appears to be very fixated on wanting some type of surgical intervention rather than trying to participate in physical therapy  -PM&R-saw the patient and felt that physical therapy was in his best interest at this time Neurosarcoidosis  -Continue Imuran, prednisone  Hypertension  -Continue the atenolol  Depression/anxiety  -Continue Wellbutrin, Cymbalta  Hyperlipidemia  -Continue Lipitor   Discharge Condition: stable  Disposition: Discharge home with home health and physical therapy  Diet: Cardiac Wt Readings from Last 3 Encounters:  05/28/12 104.4 kg (230 lb 2.6 oz)  03/02/12 103.874 kg  (229 lb)  12/29/11 102.059 kg (225 lb)    History of present illness:  46 y.o. 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 He notes that bearing weight does not make it significantly worse but is unable to walk due to pain. Symptoms became very severe on Wed of this week with fairly rapid onset and decline to his present state in the ED. He describes the pain as a "bone on bone" sensation in his low back, outside of his hip and radiating down his thigh.   Hospital Course:  The patient was admitted to the hospital and started on intravenous hydromorphone for pain control. Dr. Danielle Dess from neurosurgery was consulted. MRI of the lumbar spine did show previous L3-4 through L5-S1 fusion without any complications. There was no nerve root compression. Dr. Elbert Ewings. did not feel that the patient needed neurosurgical intervention at this time. Dr.  Eulah Pont, orthopedic surgery was consulted to see the patient. MRI of the right hip was obtained and showed increasing size of erosion with right trochanteric bursitis. Dr. Eulah Pont examined the patient and did not feel that surgical intervention was necessary. He did not feel that the patient's pain was due to the bursitis. He recommended the patient be treated with physical therapy and medications at this time. The patient requested a PM&R consultation and this was obtained. They recommended further aggressive physical therapy at this time. They did not recommend any additional intervention at this time. The patient will continue to have intermittent episodes of excruciating pain.  However in between the patient was seen to be very comfortable. On more than one occasion, the patient was noted to be drowsy and falling asleep when I spoke with his wife at the bedside. Physical therapy was consulted to work with the patient.  Options were provided to patient regarding skilled nursing facility and home health with physical therapy at home. The patient decided that he wanted to go home with physical therapy. This was set up with case management. A prescription for a rolling walker was provided and case management will help obtain this durable med equipment. The patient wanted to see a spine specialist, Dr.Cohen, but Dr. Noel Gerold usually goes to [facility]. The patient was instructed to followup with his pain management physician as soon as possible after discharge, Dr. Callie Fielding. The patient's vitals remained stable without any tachyarrhythmias or hypotension. The patient's wife was contacted and treatment options were also discussed with her. She was improved with the treatment plan and the patient was stable for discharge. A prescription for Elavil was provided to the patient to help him sleep.  Consultants: Dr. Danielle Dess Dr. Eulah Pont, orthopedics PM&R  Discharge Exam: Filed Vitals:   05/30/12 0642  BP: 119/72  Pulse: 77  Temp: 98.6 F (37 C)  Resp: 18   Filed Vitals:   05/29/12 0539 05/29/12 1513 05/29/12 2302 05/30/12 0642  BP: 118/70 121/70 138/77 119/72  Pulse: 82 83 81 77  Temp: 98.2 F (36.8 C) 97.9 F (36.6 C) 98.7 F (37.1 C) 98.6 F (37 C)  TempSrc: Oral     Resp: 18 18 18 18   Height:      Weight:      SpO2: 97% 97% 98% 100%   General: A&O x 3, NAD, pleasant, cooperative Cardiovascular: RRR, no rub, no gallop, no S3 Respiratory: CTAB, no wheeze, no rhonchi Abdomen:soft, nontender, nondistended, positive bowel sounds  Discharge Instructions      Discharge Orders    Future Appointments: Provider: Department: Dept Phone: Center:   06/02/2012 8:00 AM Gordy Savers, MD [facility] [phone] [facility]     Future Orders Please Complete By Expires   Diet - low sodium heart healthy      Increase activity slowly      Discharge instructions      Comments:   Follow up with  Dr. Callie Fielding, pain management ASAP [phone]       Medication List     As of 05/30/2012  1:01 PM    TAKE these medications         albuterol 90 MCG/ACT inhaler   Commonly known as: PROVENTIL,VENTOLIN   Inhale 2 puffs into the lungs every 6 (six) hours as needed for wheezing.      amitriptyline 25 MG tablet   Commonly known as: ELAVIL   Take 1 tablet (25 mg total) by mouth at bedtime.      Artificial Tear Gel   Apply 1 drop to eye at bedtime. For dry eye      atenolol 25 MG tablet   Commonly known as: TENORMIN   Take 25 mg by mouth 2 (two) times daily.      atorvastatin 40 MG tablet   Commonly known as: LIPITOR   Take 40 mg by mouth daily.      azaTHIOprine 50 MG tablet   Commonly known as: IMURAN   Take 50 mg by mouth 2 (two) times daily.      buPROPion 300 MG 24 hr tablet   Commonly  known as: WELLBUTRIN XL   Take 1 tablet (300 mg total) by mouth every morning.      COBAL-1000 1000 MCG/ML injection   Generic drug: cyanocobalamin   Inject 1,000 mcg into the muscle every 30 (thirty) days.      cycloSPORINE 0.05 % ophthalmic emulsion   Commonly known as: RESTASIS   1 drop 2 (two) times daily.      dicyclomine 10 MG capsule   Commonly known as: BENTYL   Take 10 mg by mouth 4 (four) times daily - after meals and at bedtime.      DULoxetine 30 MG capsule   Commonly known as: CYMBALTA   Take 30 mg by mouth daily.      DULoxetine 60 MG capsule   Commonly known as: CYMBALTA   Take 60 mg by mouth daily.      esomeprazole 40 MG capsule   Commonly known as: NEXIUM   Take 40 mg by mouth 2 (two) times daily.      gabapentin 300 MG capsule   Commonly known as: NEURONTIN   Take 900 mg by mouth 4 (four) times daily.      guaiFENesin 600 MG 12 hr tablet   Commonly known as: MUCINEX   Take 1,200 mg by mouth 2 (two) times daily.      HYDROmorphone 4 MG tablet   Commonly known as: DILAUDID   Take 1 tablet (4 mg total) by mouth every 4 (four) hours as needed  for pain.      oxybutynin 5 MG tablet   Commonly known as: DITROPAN   Take 1 tablet (5 mg total) by mouth at bedtime.      oxyCODONE-acetaminophen 10-325 MG per tablet   Commonly known as: PERCOCET   Take 1 tablet by mouth every 6 (six) hours as needed. pain      predniSONE 5 MG tablet   Commonly known as: DELTASONE   Take 5 mg by mouth daily.      promethazine 25 MG tablet   Commonly known as: PHENERGAN   Take 25 mg by mouth every 6 (six) hours as needed. For nausea      traMADol 50 MG tablet   Commonly known as: ULTRAM   Take 1 tablet (50 mg total) by mouth every 6 (six) hours as needed for pain.           The results of significant diagnostics from this hospitalization (including imaging, microbiology, ancillary and laboratory) are listed below for reference.    Significant Diagnostic Studies: Dg Lumbar Spine Complete  05/24/2012  *RADIOLOGY REPORT*  Clinical Data: Severe low back pain for 3 weeks.  History of prior surgery.  LUMBAR SPINE - COMPLETE 4+ VIEW  Comparison: Plain films lumbar spine 01/14/2012 and MRI 01/25/2012.  Findings: Again seen is postoperative change of L3-S1 fusion with pedicle screws and stabilization bars in place.  Hardware is intact.  Vertebral body height and alignment are maintained. Paraspinous structures are unremarkable.  IMPRESSION: No acute finding.  Status post L3-S1 fusion.   Original Report Authenticated By: Bernadene Bell. D'ALESSIO, M.D.    Dg Hip Complete Right  05/25/2012  *RADIOLOGY REPORT*  Clinical Data: Pain and stiffness.  RIGHT HIP - COMPLETE 2+ VIEW  Comparison: Plain films 06/20/2009.  Findings: Both hips are located.  No evidence of avascular necrosis is identified.  There is no fracture.  The patient is status post lower lumbar fusion.  Penile prosthesis is noted.  No notable degenerative change about  the hips.  IMPRESSION: No acute finding or focal abnormality.   Original Report Authenticated By: Bernadene Bell. Maricela Curet, M.D.    Mr  Lumbar Spine W Wo Contrast  05/27/2012  *RADIOLOGY REPORT*  Clinical Data: Prior lumbar surgery.  New right leg pain and buttock/hip pain.  MRI LUMBAR SPINE WITHOUT AND WITH CONTRAST  Technique:  Multiplanar and multiecho pulse sequences of the lumbar spine were obtained without and with intravenous contrast.  Contrast: 20mL MULTIHANCE GADOBENATE DIMEGLUMINE 529 MG/ML IV SOLN  Comparison: 05/24/2012 no plain film examination.  01/25/2012 MR.  Findings: Last fully open disc space is labeled L5-S1.  Present examination incorporates from T11-12 disc space through lower sacrum.  Penile prosthesis implant suspected with reservoir superior to the bladder.  Sub centimeter right renal cyst.  Conus T12 level.  3.5 mm sclerotic focus right ilium without change compared to the post myelogram CT on 07/18/2009.  T11-12 through L1-2 unremarkable.  L2-3:  Facet joint degenerative changes.  L3-4 through L5-S1 prior fusion without complication, significant spinal stenosis or foraminal narrowing noted.  IMPRESSION:  L3-4 through L5-S1 prior fusion without complication, significant spinal stenosis or foraminal narrowing noted.  L2-3 facet joint degenerative changes.  No evidence of right-sided disc herniation or nerve root compression noted  Tiny sclerotic focus right ilium stable.   Original Report Authenticated By: Fuller Canada, M.D.    Mr Hip Right Wo Contrast  05/28/2012  *RADIOLOGY REPORT*  Clinical Data: Right hip pain.  Low back pain.  MRI OF THE RIGHT HIP WITHOUT CONTRAST  Technique:  Multiplanar, multisequence MR imaging was performed. No intravenous contrast was administered.  Comparison: 05/25/2012; 07/08/2009  Findings: Degenerative or erosive appearing lesion along the lateral margin of the right femoral head observed, increased in size and conspicuity compared the prior exam.  Amount of fluid in both hip joints is in the upper normal range.  No sciatic notch impingement or asymmetric sciatic nerve edema.  No  obturator impingement.  Hip adductor musculature appears normal and symmetric.  There is minimal right-sided trochanteric bursitis.  Interval prostatectomy noted.  Penile implant in place.  Right external iliac node measures 10 mm in short axis, borderline prominent.  On the prior exam from 2010 there is an appearance suggesting partial avulsion along the left hamstring origination site; this has resolved.  No free pelvic fluid.  Trace high signal noted along the pubic symphysis without adjacent aponeurotic detachment.  Oblique sagittal images demonstrate poor signal noise ratio.  IMPRESSION:  1.  Mild increase in size of degenerative or erosive lesion along the anterolateral femoral head compared to 07/08/2009. 2.  Amount of fluid in the hip joint is at the upper normal range. 3.  Trace right trochanteric bursitis. 4.  Prostatectomy and penile implant. 5.  No sciatic notch or obturator impingement observed. 6.  Borderline prominent right external iliac lymph node.  I discussed these findings by telephone with Dr. Danielle Dess at to 2:22 p.m. on 05/28/2012.   Original Report Authenticated By: Dellia Cloud, M.D.      Microbiology: No results found for this or any previous visit (from the past 240 hour(s)).   Labs: Basic Metabolic Panel:  Lab 05/29/12 1610 05/28/12 0605 05/27/12 2310 05/27/12 0943 05/25/12 0231  NA 136 138 -- 134* 136  K 3.9 4.1 -- -- --  CL 101 101 -- 99 102  CO2 25 28 -- 28 25  GLUCOSE 95 88 -- 90 108*  BUN 13 12 -- 11 13  CREATININE  0.95 1.00 0.93 0.96 1.01  CALCIUM 9.5 9.4 -- 9.2 9.4  MG -- -- -- -- --  PHOS -- -- -- -- --   Liver Function Tests: No results found for this basename: AST:5,ALT:5,ALKPHOS:5,BILITOT:5,PROT:5,ALBUMIN:5 in the last 168 hours No results found for this basename: LIPASE:5,AMYLASE:5 in the last 168 hours No results found for this basename: AMMONIA:5 in the last 168 hours CBC:  Lab 05/29/12 0500 05/28/12 0605 05/27/12 2310 05/27/12 0943 05/25/12  0231  WBC 4.0 4.8 5.2 6.1 4.6  NEUTROABS -- -- -- 3.9 --  HGB 13.5 13.0 12.8* 13.2 13.8  HCT 39.0 38.7* 37.5* 38.8* 39.5  MCV 85.7 88.4 87.6 88.0 86.4  PLT 276 260 249 262 271   Cardiac Enzymes: No results found for this basename: CKTOTAL:5,CKMB:5,CKMBINDEX:5,TROPONINI:5 in the last 168 hours BNP: No components found with this basename: POCBNP:5 CBG:  Lab 05/28/12 0702  GLUCAP 90    Time coordinating discharge:  Greater than 30 minutes  Signed:  Araf Clugston, DO [facility] Pager: [phone] 05/30/2012, 1:01 PM

## 2012-05-30 NOTE — Evaluation (Signed)
Physical Therapy Evaluation Patient Details Name: [name] MRN: [id number] DOB: [birth year] Today's Date: 05/30/2012 Time: 9629-5284 PT Time Calculation (min): 30 min  PT Assessment / Plan / Recommendation Clinical Impression  Pt presents with R hip pain with history of neurosarcoidosis, lumbar spine fusion with residual ridiculopathy and prostate cancer.  MRI of hip shows some bursitis and mild erosion of hip with ortho consulted and per notes, no surgical intervention is warranted at this time.  Pt able to tolerate OOB and ambulation to/from restroom with RW, however with severe pain noted, causing pt to be a high fall risk when hip "spasms."  Attempted to perform therex with pt in recliner, however pt in too much pain to continue.  Pt will benefit from skilled PT in acute venue to address deficits.  PT recommends HHPT vs ST SNF/CIR pending progress and pain managment.     PT Assessment  Patient needs continued PT services    Follow Up Recommendations  Home health PT;Inpatient Rehab;Skilled nursing facility    Barriers to Discharge Decreased caregiver support Pts wife and daughter are available in afternoon only.     Equipment Recommendations  Rolling walker with 5" wheels;3 in 1 bedside comode    Recommendations for Other Services OT consult   Frequency Min 5X/week    Precautions / Restrictions Precautions Precautions: Fall Restrictions Weight Bearing Restrictions: No   Pertinent Vitals/Pain 9/10, repositioned      Mobility  Bed Mobility Bed Mobility: Supine to Sit;Sitting - Scoot to Edge of Bed Supine to Sit: 4: Min assist;HOB elevated Sitting - Scoot to [place] of Bed: 5: Supervision Details for Bed Mobility Assistance: Assist for RLE out of bed with cues for hand placement, LLE placement and technique to self assist to EOB.  Transfers Transfers: Sit to Stand;Stand to Sit Sit to Stand: 3: Mod assist;2: Max assist;From elevated surface;With upper extremity assist;From  bed Stand to Sit: 4: Min assist;With upper extremity assist;With armrests;To chair/3-in-1 Details for Transfer Assistance: Assist to rise and steady due to increased pain in R hip with max cues for hand placement, LE management and safety when sitting/standing.  Performed x 2 in order to use 3in1 in restroom.   Ambulation/Gait Ambulation/Gait Assistance: 4: Min assist;3: Mod assist Ambulation Distance (Feet): 12 Feet (then another 12') Assistive device: Rolling walker Ambulation/Gait Assistance Details: Cues for sequencing/technique with RW and to increase UE WB to decrease weight on R LE.   Gait Pattern: Step-to pattern;Decreased stance time - right;Decreased step length - left;Decreased stride length;Antalgic Gait velocity: decreased Stairs: No Wheelchair Mobility Wheelchair Mobility: No    Exercises     PT Diagnosis: Difficulty walking;Generalized weakness;Acute pain  PT Problem List: Decreased strength;Decreased range of motion;Decreased activity tolerance;Decreased balance;Decreased mobility;Decreased knowledge of use of DME;Pain;Decreased safety awareness PT Treatment Interventions: DME instruction;Gait training;Stair training;Functional mobility training;Therapeutic activities;Therapeutic exercise;Balance training;Patient/family education   PT Goals Acute Rehab PT Goals PT Goal Formulation: With patient Time For Goal Achievement: 06/06/12 Potential to Achieve Goals: Good Pt will go Supine/Side to Sit: with supervision PT Goal: Supine/Side to Sit - Progress: Goal set today Pt will go Sit to Supine/Side: with supervision PT Goal: Sit to Supine/Side - Progress: Goal set today Pt will go Sit to Stand: with supervision PT Goal: Sit to Stand - Progress: Goal set today Pt will go Stand to Sit: with supervision PT Goal: Stand to Sit - Progress: Goal set today Pt will Ambulate: 51 - 150 feet;with supervision;with least restrictive assistive device PT Goal: Ambulate -  Progress: Goal  set today Pt will Go Up / Down Stairs: Flight;with min assist;with least restrictive assistive device;with rail(s) (if home D/C) PT Goal: Up/Down Stairs - Progress: Goal set today  Visit Information  Last PT Received On: 05/30/12 Assistance Needed: +1    Subjective Data  Subjective: I went home then had to come back in an ambulance  Patient Stated Goal: to get pain under control   Prior Functioning  Home Living Lives With: Spouse;Family Available Help at Discharge: Family Type of Home: House Home Access: Stairs to enter Secretary/administrator of Steps: stoop Home Layout: Two level;Bed/bath upstairs Alternate Level Stairs-Number of Steps: 3 then platform then 10 more Alternate Level Stairs-Rails: Right Bathroom Shower/Tub: Tub/shower unit;Walk-in shower Bathroom Toilet: Handicapped height Prior Function Level of Independence: Independent Able to Take Stairs?: Yes Driving: No Vocation: Part time employment Communication Communication: No difficulties    Cognition  Overall Cognitive Status: Appears within functional limits for tasks assessed/performed Arousal/Alertness: Awake/alert Orientation Level: Appears intact for tasks assessed Behavior During Session: [facility] for tasks performed    Extremity/Trunk Assessment Right Lower Extremity Assessment RLE ROM/Strength/Tone: Deficits;Unable to fully assess;Due to pain RLE ROM/Strength/Tone Deficits: hip flex 3-/5, knee ext 3-/5, ankle motions WFL RLE Sensation: WFL - Light Touch Left Lower Extremity Assessment LLE ROM/Strength/Tone: WFL for tasks assessed LLE Sensation: WFL - Light Touch LLE Coordination: WFL - gross/fine motor Trunk Assessment Trunk Assessment: Normal   Balance    End of Session PT - End of Session Equipment Utilized During Treatment: Gait belt Activity Tolerance: Patient limited by pain Patient left: in chair;with call bell/phone within reach  GP Functional Assessment Tool Used: Clinical judgement.    Functional Limitation: Mobility: Walking and moving around Mobility: Walking and Moving Around Current Status [phone]): At least 40 percent but less than 60 percent impaired, limited or restricted Mobility: Walking and Moving Around Goal Status [phone]): At least 20 percent but less than 40 percent impaired, limited or restricted   Lessie Dings 05/30/2012, 10:19 AM

## 2012-05-30 NOTE — Progress Notes (Signed)
Thank you for consult on [name].  Chart reveiwed and work up currently consistent with bursitis. Pain management is major issue here and patient is fixated on surgical intervention. Recommend initiating physical therapy to help decide follow up treatment options.

## 2012-05-31 DIAGNOSIS — IMO0002 Reserved for concepts with insufficient information to code with codable children: Secondary | ICD-10-CM | POA: Diagnosis not present

## 2012-05-31 DIAGNOSIS — M169 Osteoarthritis of hip, unspecified: Secondary | ICD-10-CM | POA: Diagnosis not present

## 2012-05-31 DIAGNOSIS — M25559 Pain in unspecified hip: Secondary | ICD-10-CM | POA: Diagnosis not present

## 2012-05-31 DIAGNOSIS — M1610 Unilateral primary osteoarthritis, unspecified hip: Secondary | ICD-10-CM | POA: Diagnosis not present

## 2012-05-31 DIAGNOSIS — M549 Dorsalgia, unspecified: Secondary | ICD-10-CM | POA: Diagnosis not present

## 2012-05-31 DIAGNOSIS — M961 Postlaminectomy syndrome, not elsewhere classified: Secondary | ICD-10-CM | POA: Diagnosis not present

## 2012-05-31 DIAGNOSIS — F329 Major depressive disorder, single episode, unspecified: Secondary | ICD-10-CM | POA: Diagnosis not present

## 2012-05-31 DIAGNOSIS — I10 Essential (primary) hypertension: Secondary | ICD-10-CM | POA: Diagnosis not present

## 2012-05-31 DIAGNOSIS — R209 Unspecified disturbances of skin sensation: Secondary | ICD-10-CM | POA: Diagnosis not present

## 2012-06-02 ENCOUNTER — Ambulatory Visit: Payer: Medicare Other | Admitting: Internal Medicine

## 2012-06-02 DIAGNOSIS — M545 Low back pain, unspecified: Secondary | ICD-10-CM | POA: Diagnosis not present

## 2012-06-03 DIAGNOSIS — I10 Essential (primary) hypertension: Secondary | ICD-10-CM | POA: Diagnosis not present

## 2012-06-03 DIAGNOSIS — G4733 Obstructive sleep apnea (adult) (pediatric): Secondary | ICD-10-CM | POA: Diagnosis not present

## 2012-06-03 DIAGNOSIS — M79609 Pain in unspecified limb: Secondary | ICD-10-CM | POA: Diagnosis not present

## 2012-06-03 DIAGNOSIS — E7800 Pure hypercholesterolemia, unspecified: Secondary | ICD-10-CM | POA: Diagnosis not present

## 2012-06-03 DIAGNOSIS — K219 Gastro-esophageal reflux disease without esophagitis: Secondary | ICD-10-CM | POA: Diagnosis not present

## 2012-06-03 DIAGNOSIS — G894 Chronic pain syndrome: Secondary | ICD-10-CM | POA: Diagnosis not present

## 2012-06-04 ENCOUNTER — Other Ambulatory Visit: Payer: Self-pay | Admitting: Internal Medicine

## 2012-06-05 DIAGNOSIS — K219 Gastro-esophageal reflux disease without esophagitis: Secondary | ICD-10-CM | POA: Diagnosis not present

## 2012-06-05 DIAGNOSIS — E7800 Pure hypercholesterolemia, unspecified: Secondary | ICD-10-CM | POA: Diagnosis not present

## 2012-06-05 DIAGNOSIS — G4733 Obstructive sleep apnea (adult) (pediatric): Secondary | ICD-10-CM | POA: Diagnosis not present

## 2012-06-05 DIAGNOSIS — G894 Chronic pain syndrome: Secondary | ICD-10-CM | POA: Diagnosis not present

## 2012-06-05 DIAGNOSIS — I10 Essential (primary) hypertension: Secondary | ICD-10-CM | POA: Diagnosis not present

## 2012-06-05 DIAGNOSIS — M79609 Pain in unspecified limb: Secondary | ICD-10-CM | POA: Diagnosis not present

## 2012-06-07 DIAGNOSIS — E7800 Pure hypercholesterolemia, unspecified: Secondary | ICD-10-CM | POA: Diagnosis not present

## 2012-06-07 DIAGNOSIS — G4733 Obstructive sleep apnea (adult) (pediatric): Secondary | ICD-10-CM | POA: Diagnosis not present

## 2012-06-07 DIAGNOSIS — M79609 Pain in unspecified limb: Secondary | ICD-10-CM | POA: Diagnosis not present

## 2012-06-07 DIAGNOSIS — G894 Chronic pain syndrome: Secondary | ICD-10-CM | POA: Diagnosis not present

## 2012-06-07 DIAGNOSIS — I10 Essential (primary) hypertension: Secondary | ICD-10-CM | POA: Diagnosis not present

## 2012-06-07 DIAGNOSIS — K219 Gastro-esophageal reflux disease without esophagitis: Secondary | ICD-10-CM | POA: Diagnosis not present

## 2012-06-08 ENCOUNTER — Ambulatory Visit: Payer: Medicare Other | Admitting: Internal Medicine

## 2012-06-09 DIAGNOSIS — G4733 Obstructive sleep apnea (adult) (pediatric): Secondary | ICD-10-CM | POA: Diagnosis not present

## 2012-06-09 DIAGNOSIS — I10 Essential (primary) hypertension: Secondary | ICD-10-CM | POA: Diagnosis not present

## 2012-06-09 DIAGNOSIS — M79609 Pain in unspecified limb: Secondary | ICD-10-CM | POA: Diagnosis not present

## 2012-06-09 DIAGNOSIS — K219 Gastro-esophageal reflux disease without esophagitis: Secondary | ICD-10-CM | POA: Diagnosis not present

## 2012-06-09 DIAGNOSIS — G894 Chronic pain syndrome: Secondary | ICD-10-CM | POA: Diagnosis not present

## 2012-06-09 DIAGNOSIS — E7800 Pure hypercholesterolemia, unspecified: Secondary | ICD-10-CM | POA: Diagnosis not present

## 2012-06-14 DIAGNOSIS — M961 Postlaminectomy syndrome, not elsewhere classified: Secondary | ICD-10-CM | POA: Diagnosis not present

## 2012-06-14 DIAGNOSIS — IMO0002 Reserved for concepts with insufficient information to code with codable children: Secondary | ICD-10-CM | POA: Diagnosis not present

## 2012-06-26 ENCOUNTER — Other Ambulatory Visit: Payer: Self-pay | Admitting: Internal Medicine

## 2012-07-03 ENCOUNTER — Other Ambulatory Visit: Payer: Self-pay | Admitting: Internal Medicine

## 2012-07-11 ENCOUNTER — Other Ambulatory Visit: Payer: Self-pay | Admitting: Internal Medicine

## 2012-07-11 DIAGNOSIS — IMO0002 Reserved for concepts with insufficient information to code with codable children: Secondary | ICD-10-CM | POA: Diagnosis not present

## 2012-07-13 ENCOUNTER — Encounter: Payer: Self-pay | Admitting: Internal Medicine

## 2012-07-13 ENCOUNTER — Ambulatory Visit (INDEPENDENT_AMBULATORY_CARE_PROVIDER_SITE_OTHER): Payer: Medicare Other | Admitting: Internal Medicine

## 2012-07-13 ENCOUNTER — Other Ambulatory Visit: Payer: Self-pay | Admitting: Neurosurgery

## 2012-07-13 VITALS — BP 110/90 | Temp 98.2°F | Wt 222.0 lb

## 2012-07-13 DIAGNOSIS — M25559 Pain in unspecified hip: Secondary | ICD-10-CM | POA: Diagnosis not present

## 2012-07-13 DIAGNOSIS — K219 Gastro-esophageal reflux disease without esophagitis: Secondary | ICD-10-CM

## 2012-07-13 DIAGNOSIS — E7800 Pure hypercholesterolemia, unspecified: Secondary | ICD-10-CM | POA: Diagnosis not present

## 2012-07-13 DIAGNOSIS — R52 Pain, unspecified: Secondary | ICD-10-CM

## 2012-07-13 DIAGNOSIS — M1990 Unspecified osteoarthritis, unspecified site: Secondary | ICD-10-CM

## 2012-07-13 DIAGNOSIS — I428 Other cardiomyopathies: Secondary | ICD-10-CM

## 2012-07-13 DIAGNOSIS — M549 Dorsalgia, unspecified: Secondary | ICD-10-CM

## 2012-07-13 MED ORDER — ESOMEPRAZOLE MAGNESIUM 40 MG PO CPDR: 40.0000 mg | DELAYED_RELEASE_CAPSULE | Freq: Every day | ORAL | Status: DC

## 2012-07-13 NOTE — Progress Notes (Signed)
Subjective:    Patient ID: [name], male    DOB: 1966/06/17, 46 y.o.   MRN: [id number]  HPI  46 year old patient who is seen today for followup. He was discharged on hospital on September 24 due to  severe hip pain. He has a history of chronic back pain.  He was seen by advanced home care nursing services 3 times within the week post discharge. He received a spinal epidural and since that time has done quite well and has not required any further nursing services or home physical therapy. When the patient was discharged from the hospital he required a walker at the present time he is doing quite well. He continues to be followed by chronic pain management do to chronic back pain he remains on Percocet once or twice daily. He has a history of impaired glucose tolerance. Blood sugars during his hospital stay were fairly unremarkable. He has a history of hypertrophic cardiomyopathy and remains on low dose beta blocker therapy. He remains on atorvastatin for dyslipidemia.  Hospital records reviewed  Past Medical History  Diagnosis Date  . B12 DEFICIENCY 03/06/2007  . CARDIOMYOPATHY, IDIOPATHIC HYPERTROPHIC 03/06/2007  . DEPRESSION 03/06/2007  . GERD 06/01/2007  . HYPERCHOLESTEROLEMIA 03/06/2007  . HYPERLIPIDEMIA 11/08/2008  . HYPERTENSION 03/06/2007  . IMPAIRED GLUCOSE TOLERANCE 11/08/2008  . NEPHROLITHIASIS, HX OF 04/07/2010  . 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 OSTEOARTHRITIS 06/01/2007  . Palpitations 10/23/2007  . SLEEP APNEA 03/06/2007  . SYNDROME, CHRONIC PAIN 03/06/2007  . Shortness of breath   . Cancer     prostate ca  . Prostate cancer     History   Social History  . Marital Status: Married    Spouse Name: N/A    Number of Children: N/A  . 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 Smoking status: Former Smoker    Quit date: 09/07/1983  . Smokeless tobacco: Never Used  . Alcohol Use: No  . Drug Use: No  . Sexually Active: Yes    Other Topics Concern  . Not on file   Social History Narrative  . No narrative on file    Past Surgical History  Procedure Date  . Decompression and fusion     cervicothoracic  . Partial nephrectomy   . Cardiac catheterization   . Lumbar fusion   . Prostate surgery     robotic prostatectomy  . Penile prosthesis implant     Family History  Problem Relation Age of Onset  . Heart disease Neg Hx     family hx CHF  . Kidney disease Neg Hx     family hx   . Cancer Neg Hx     family hx prostate    No Known Allergies  Current Outpatient Prescriptions on File Prior to Visit  Medication Sig Dispense Refill  . albuterol (PROVENTIL,VENTOLIN) 90 MCG/ACT inhaler Inhale 2 puffs into the lungs every 6 (six) hours as needed for wheezing.  17 g  12  . amitriptyline (ELAVIL) 25 MG tablet Take 1 tablet (25 mg total) by mouth at bedtime.  30 tablet  0  . Artificial Tear GEL Apply 1 drop to eye at bedtime. For dry eye      . atenolol (TENORMIN) 25 MG tablet Take 25 mg by mouth 2 (two) times daily.      Marland Kitchen atorvastatin (LIPITOR) 40 MG tablet Take 40 mg by mouth daily.      Marland Kitchen  azaTHIOprine (IMURAN) 50 MG tablet Take 50 mg by mouth 2 (two) times daily.       Marland Kitchen buPROPion (WELLBUTRIN XL) 300 MG 24 hr tablet TAKE ONE TABLET BY EVERY MORNING  90 tablet  0  . cyanocobalamin (COBAL-1000) 1000 MCG/ML injection Inject 1,000 mcg into the muscle every 30 (thirty) days.       . cycloSPORINE (RESTASIS) 0.05 % ophthalmic emulsion 1 drop 2 (two) times daily.       . CYMBALTA 60 MG capsule TAKE ONE CAPSULE BY MOUTH DAILY  90 capsule  0  . dicyclomine (BENTYL) 10 MG capsule Take 10 mg by mouth 4 (four) times daily - after meals and at bedtime.      . DULoxetine (CYMBALTA) 30 MG capsule Take 30 mg by mouth daily.      . DULoxetine (CYMBALTA) 60 MG capsule Take 60 mg by mouth daily.      Marland Kitchen esomeprazole (NEXIUM) 40 MG capsule Take 40 mg by mouth 2 (two) times daily.      Marland Kitchen gabapentin (NEURONTIN) 300 MG capsule  Take 900 mg by mouth 4 (four) times daily.       Marland Kitchen guaiFENesin (MUCINEX) 600 MG 12 hr tablet Take 1,200 mg by mouth 2 (two) times daily.      Marland Kitchen HYDROmorphone (DILAUDID) 4 MG tablet Take 1 tablet (4 mg total) by mouth every 4 (four) hours as needed for pain.  30 tablet  0  . oxybutynin (DITROPAN) 5 MG tablet TAKE 1 TABLET BY MOUTH DAILY  90 tablet  0  . oxyCODONE-acetaminophen (PERCOCET) 10-325 MG per tablet Take 1 tablet by mouth every 6 (six) hours as needed. pain      . predniSONE (DELTASONE) 5 MG tablet Take 5 mg by mouth daily.       . promethazine (PHENERGAN) 25 MG tablet Take 25 mg by mouth every 6 (six) hours as needed. For nausea      . promethazine (PHENERGAN) 25 MG tablet TAKE ONE TABLET BY MOUTH EVERY 6 HOURS AS NEEDED FOR NAUSEA  15 tablet  0  . traMADol (ULTRAM) 50 MG tablet TAKE ONE TABLET BY MOUTH EVERY 6 HOURS AS NEEDED FOR PAIN  90 tablet  0    BP 110/90  Temp 98.2 F (36.8 C) (Oral)  Wt 222 lb (100.699 kg)       Review of Systems  Constitutional: Negative for fever, chills, appetite change and fatigue.  HENT: Negative for hearing loss, ear pain, congestion, sore throat, trouble swallowing, neck stiffness, dental problem, voice change and tinnitus.   Eyes: Negative for pain, discharge and visual disturbance.  Respiratory: Negative for cough, chest tightness, wheezing and stridor.   Cardiovascular: Negative for chest pain, palpitations and leg swelling.  Gastrointestinal: Negative for nausea, vomiting, abdominal pain, diarrhea, constipation, blood in stool and abdominal distention.  Genitourinary: Negative for urgency, hematuria, flank pain, discharge, difficulty urinating and genital sores.  Musculoskeletal: Positive for back pain. Negative for myalgias, joint swelling, arthralgias and gait problem.  Skin: Negative for rash.  Neurological: Negative for dizziness, syncope, speech difficulty, weakness, numbness and headaches.  Hematological: Negative for adenopathy.  Does not bruise/bleed easily.  Psychiatric/Behavioral: Negative for behavioral problems and dysphoric mood. The patient is not nervous/anxious.        Objective:   Physical Exam  Constitutional: He is oriented to person, place, and time. He appears well-developed.       Blood pressure 110/80  HENT:  Head: Normocephalic.  Right Ear: External  ear normal.  Left Ear: External ear normal.  Eyes: Conjunctivae normal and EOM are normal.  Neck: Normal range of motion.  Cardiovascular: Normal rate and normal heart sounds.   No murmur heard. Pulmonary/Chest: Breath sounds normal.  Abdominal: Bowel sounds are normal.  Musculoskeletal: Normal range of motion. He exhibits no edema and no tenderness.  Neurological: He is alert and oriented to person, place, and time.  Psychiatric: He has a normal mood and affect. His behavior is normal.          Assessment & Plan:   Hypertrophic cardiomyopathy stable. We'll continue low-dose beta blocker therapy Dyslipidemia. We'll controlled Chronic low back pain. Follow chronic pain management History of impaired glucose tolerance  Recheck 6 months Low-salt diet recommended Exercise regimen and modest weight loss encouraged

## 2012-07-13 NOTE — Patient Instructions (Signed)
Limit your sodium (Salt) intake    It is important that you exercise regularly, at least 20 minutes 3 to 4 times per week.  If you develop chest pain or shortness of breath seek  medical attention.  You need to lose weight.  Consider a lower calorie diet and regular exercise.  Return in 6 months for follow-up   

## 2012-07-17 ENCOUNTER — Other Ambulatory Visit: Payer: Medicare Other

## 2012-07-26 ENCOUNTER — Ambulatory Visit
Admission: RE | Admit: 2012-07-26 | Discharge: 2012-07-26 | Disposition: A | Discharge status: Home / Self Care | Payer: Medicare Other | Source: Ambulatory Visit | Attending: Neurosurgery | Admitting: Neurosurgery

## 2012-07-26 DIAGNOSIS — M545 Low back pain, unspecified: Secondary | ICD-10-CM | POA: Diagnosis not present

## 2012-07-26 DIAGNOSIS — M549 Dorsalgia, unspecified: Secondary | ICD-10-CM

## 2012-08-01 DIAGNOSIS — IMO0002 Reserved for concepts with insufficient information to code with codable children: Secondary | ICD-10-CM | POA: Diagnosis not present

## 2012-08-02 ENCOUNTER — Other Ambulatory Visit: Payer: Self-pay | Admitting: Internal Medicine

## 2012-08-02 DIAGNOSIS — IMO0002 Reserved for concepts with insufficient information to code with codable children: Secondary | ICD-10-CM | POA: Diagnosis not present

## 2012-08-09 ENCOUNTER — Other Ambulatory Visit: Payer: Self-pay | Admitting: Neurosurgery

## 2012-08-15 ENCOUNTER — Other Ambulatory Visit (HOSPITAL_COMMUNITY): Payer: Medicare Other

## 2012-08-18 ENCOUNTER — Encounter (HOSPITAL_COMMUNITY)
Admission: RE | Admit: 2012-08-18 | Discharge: 2012-08-18 | Disposition: A | Discharge status: Home / Self Care | Payer: Medicare Other | Source: Ambulatory Visit | Attending: Neurosurgery | Admitting: Neurosurgery

## 2012-08-18 ENCOUNTER — Encounter (HOSPITAL_COMMUNITY): Payer: Self-pay | Admitting: Pharmacy Technician

## 2012-08-18 ENCOUNTER — Encounter (HOSPITAL_COMMUNITY): Payer: Self-pay

## 2012-08-18 HISTORY — DX: Pneumonia, unspecified organism: J18.9

## 2012-08-18 HISTORY — DX: Cardiac arrhythmia, unspecified: I49.9

## 2012-08-18 LAB — CBC
HCT: 39.8 % (ref 39.0–52.0)
Hemoglobin: 13.5 g/dL (ref 13.0–17.0)
MCH: 29 pg (ref 26.0–34.0)
MCHC: 33.9 g/dL (ref 30.0–36.0)
MCV: 85.6 fL (ref 78.0–100.0)
Platelets: 289 10*3/uL (ref 150–400)
RBC: 4.65 MIL/uL (ref 4.22–5.81)
RDW: 13.5 % (ref 11.5–15.5)
WBC: 3.5 10*3/uL — ABNORMAL LOW (ref 4.0–10.5)

## 2012-08-18 LAB — SURGICAL PCR SCREEN
MRSA, PCR: NEGATIVE
Staphylococcus aureus: NEGATIVE

## 2012-08-18 LAB — BASIC METABOLIC PANEL
BUN: 13 mg/dL (ref 6–23)
CO2: 25 mEq/L (ref 19–32)
Calcium: 9.1 mg/dL (ref 8.4–10.5)
Chloride: 104 mEq/L (ref 96–112)
Creatinine, Ser: 0.96 mg/dL (ref 0.50–1.35)
GFR calc Af Amer: >90 mL/min (ref >90)
GFR calc non Af Amer: >90 mL/min (ref >90)
Glucose, Bld: 87 mg/dL (ref 70–99)
Potassium: 4.1 mEq/L (ref 3.5–5.1)
Sodium: 138 mEq/L (ref 135–145)

## 2012-08-18 LAB — TYPE AND SCREEN
ABO/RH(D): O POS
Antibody Screen: NEGATIVE

## 2012-08-18 NOTE — Progress Notes (Signed)
Contacted Dr. Randa Evens regarding patients cardiac/ pulmonary records.  Request chart to be reviewed by [facility] Monday 08/21/12 am.  Patients surgery is scheduled for 1550pm 08/21/12.

## 2012-08-18 NOTE — Progress Notes (Addendum)
Faxed request to Dr. Austin Miles office requesting last office visit notes and PFT's.  Faxed request to [facility] for cardiac cath.

## 2012-08-18 NOTE — Pre-Procedure Instructions (Signed)
20 MABRY TIFT  08/18/2012   Your procedure is scheduled on:  Monday August 21, 2012  Report to [facility] at 12:30PM.  Call this number if you have problems the morning of surgery: [phone]   Remember:   Do not eat food or drink:After Midnight.    Take these medicines the morning of surgery with A SIP OF WATER: albuterol (bring day of surgery), atenolol, imuran, wellbutrin, cymbalta, valium, cymbalta, nexium, gabapentin, oxycontin and (oxycodone or dilaudid), ditropan, prednisone,    Do not wear jewelry, make-up or nail polish.  Do not wear lotions, powders, or perfumes.  Do not shave 48 hours prior to surgery. Men may shave face and neck.  Do not bring valuables to the hospital.  Contacts, dentures or bridgework may not be worn into surgery.  Leave suitcase in the car. After surgery it may be brought to your room.  For patients admitted to the hospital, checkout time is 11:00 AM the day of discharge.   Patients discharged the day of surgery will not be allowed to drive home.  Name and phone number of your driver: family / friend  Special Instructions: Shower using CHG 2 nights before surgery and the night before surgery.  If you shower the day of surgery use CHG.  Use special wash - you have one bottle of CHG for all showers.  You should use approximately 1/3 of the bottle for each shower.   Please read over the following fact sheets that you were given: Pain Booklet, Coughing and Deep Breathing, Blood Transfusion Information, MRSA Information and Surgical Site Infection Prevention

## 2012-08-20 MED ORDER — CEFAZOLIN SODIUM-DEXTROSE 2-3 GM-% IV SOLR
2.0000 g | INTRAVENOUS | Status: AC
  Administered 2012-08-21: 2 g via INTRAVENOUS
  Filled 2012-08-20: qty 50

## 2012-08-21 ENCOUNTER — Inpatient Hospital Stay (HOSPITAL_COMMUNITY): Payer: Medicare Other

## 2012-08-21 ENCOUNTER — Encounter (HOSPITAL_COMMUNITY): Payer: Self-pay | Admitting: *Deleted

## 2012-08-21 ENCOUNTER — Encounter (HOSPITAL_COMMUNITY): Payer: Self-pay | Admitting: Vascular Surgery

## 2012-08-21 ENCOUNTER — Inpatient Hospital Stay (HOSPITAL_COMMUNITY): Payer: Medicare Other | Admitting: Vascular Surgery

## 2012-08-21 ENCOUNTER — Inpatient Hospital Stay (HOSPITAL_COMMUNITY)
Admission: RE | Admit: 2012-08-21 | Discharge: 2012-08-24 | DRG: 460 | Disposition: A | Discharge status: Home / Self Care | Payer: Medicare Other | Source: Ambulatory Visit | Attending: Neurosurgery | Admitting: Neurosurgery

## 2012-08-21 ENCOUNTER — Encounter (HOSPITAL_COMMUNITY)
Admission: RE | Disposition: A | Discharge status: Home / Self Care | Payer: Self-pay | Source: Ambulatory Visit | Attending: Neurosurgery

## 2012-08-21 DIAGNOSIS — F329 Major depressive disorder, single episode, unspecified: Secondary | ICD-10-CM | POA: Diagnosis present

## 2012-08-21 DIAGNOSIS — G894 Chronic pain syndrome: Secondary | ICD-10-CM | POA: Diagnosis present

## 2012-08-21 DIAGNOSIS — Z981 Arthrodesis status: Secondary | ICD-10-CM | POA: Diagnosis not present

## 2012-08-21 DIAGNOSIS — Z87891 Personal history of nicotine dependence: Secondary | ICD-10-CM | POA: Diagnosis not present

## 2012-08-21 DIAGNOSIS — E785 Hyperlipidemia, unspecified: Secondary | ICD-10-CM | POA: Diagnosis present

## 2012-08-21 DIAGNOSIS — K219 Gastro-esophageal reflux disease without esophagitis: Secondary | ICD-10-CM | POA: Diagnosis present

## 2012-08-21 DIAGNOSIS — I10 Essential (primary) hypertension: Secondary | ICD-10-CM | POA: Diagnosis present

## 2012-08-21 DIAGNOSIS — Z8546 Personal history of malignant neoplasm of prostate: Secondary | ICD-10-CM

## 2012-08-21 DIAGNOSIS — E7800 Pure hypercholesterolemia, unspecified: Secondary | ICD-10-CM | POA: Diagnosis present

## 2012-08-21 DIAGNOSIS — Y92009 Unspecified place in unspecified non-institutional (private) residence as the place of occurrence of the external cause: Secondary | ICD-10-CM

## 2012-08-21 DIAGNOSIS — T84498A Other mechanical complication of other internal orthopedic devices, implants and grafts, initial encounter: Secondary | ICD-10-CM | POA: Diagnosis not present

## 2012-08-21 DIAGNOSIS — Y831 Surgical operation with implant of artificial internal device as the cause of abnormal reaction of the patient, or of later complication, without mention of misadventure at the time of the procedure: Secondary | ICD-10-CM | POA: Diagnosis present

## 2012-08-21 DIAGNOSIS — Z01812 Encounter for preprocedural laboratory examination: Secondary | ICD-10-CM | POA: Diagnosis not present

## 2012-08-21 DIAGNOSIS — M545 Low back pain, unspecified: Secondary | ICD-10-CM | POA: Diagnosis not present

## 2012-08-21 DIAGNOSIS — IMO0002 Reserved for concepts with insufficient information to code with codable children: Secondary | ICD-10-CM | POA: Diagnosis not present

## 2012-08-21 DIAGNOSIS — F3289 Other specified depressive episodes: Secondary | ICD-10-CM | POA: Diagnosis present

## 2012-08-21 SURGERY — POSTERIOR LUMBAR FUSION 1 LEVEL
Anesthesia: General | Site: Back | Wound class: Clean

## 2012-08-21 MED ORDER — ARTIFICIAL TEARS OP OINT
TOPICAL_OINTMENT | OPHTHALMIC | Status: DC | PRN
  Administered 2012-08-21: 1 via OPHTHALMIC

## 2012-08-21 MED ORDER — LACTATED RINGERS IV SOLN
INTRAVENOUS | Status: DC | PRN
  Administered 2012-08-21 (×2): via INTRAVENOUS

## 2012-08-21 MED ORDER — DIPHENHYDRAMINE HCL 12.5 MG/5ML PO ELIX
12.5000 mg | ORAL_SOLUTION | Freq: Four times a day (QID) | ORAL | Status: DC | PRN
  Administered 2012-08-22 – 2012-08-23 (×4): 12.5 mg via ORAL
  Filled 2012-08-21 (×4): qty 10

## 2012-08-21 MED ORDER — BACITRACIN 50000 UNITS IM SOLR
INTRAMUSCULAR | Status: AC
  Filled 2012-08-21: qty 1

## 2012-08-21 MED ORDER — NEOSTIGMINE METHYLSULFATE 1 MG/ML IJ SOLN
INTRAMUSCULAR | Status: DC | PRN
  Administered 2012-08-21: 5 mg via INTRAVENOUS

## 2012-08-21 MED ORDER — OXYCODONE HCL 5 MG PO TABS: 5.0000 mg | ORAL_TABLET | Freq: Once | ORAL | Status: DC | PRN

## 2012-08-21 MED ORDER — 0.9 % SODIUM CHLORIDE (POUR BTL) OPTIME
TOPICAL | Status: DC | PRN
  Administered 2012-08-21: 1000 mL

## 2012-08-21 MED ORDER — SODIUM CHLORIDE 0.9 % IR SOLN
Status: DC | PRN
  Administered 2012-08-21: 17:00:00

## 2012-08-21 MED ORDER — HYDROMORPHONE HCL PF 1 MG/ML IJ SOLN
0.2500 mg | INTRAMUSCULAR | Status: DC | PRN
  Administered 2012-08-21 (×2): 0.5 mg via INTRAVENOUS

## 2012-08-21 MED ORDER — SODIUM CHLORIDE 0.9 % IJ SOLN
9.0000 mL | INTRAMUSCULAR | Status: DC | PRN
  Administered 2012-08-23: 9 mL via INTRAVENOUS

## 2012-08-21 MED ORDER — PROMETHAZINE HCL 25 MG/ML IJ SOLN
INTRAMUSCULAR | Status: AC
  Filled 2012-08-21: qty 1

## 2012-08-21 MED ORDER — BUPIVACAINE-EPINEPHRINE PF 0.5-1:200000 % IJ SOLN
INTRAMUSCULAR | Status: DC | PRN
  Administered 2012-08-21: 10 mL

## 2012-08-21 MED ORDER — GLYCOPYRROLATE 0.2 MG/ML IJ SOLN
INTRAMUSCULAR | Status: DC | PRN
  Administered 2012-08-21: .8 mg via INTRAVENOUS

## 2012-08-21 MED ORDER — MORPHINE SULFATE (PF) 1 MG/ML IV SOLN
INTRAVENOUS | Status: AC
  Filled 2012-08-21: qty 25

## 2012-08-21 MED ORDER — VECURONIUM BROMIDE 10 MG IV SOLR
INTRAVENOUS | Status: DC | PRN
  Administered 2012-08-21: 2 mg via INTRAVENOUS

## 2012-08-21 MED ORDER — PROPOFOL 10 MG/ML IV BOLUS
INTRAVENOUS | Status: DC | PRN
  Administered 2012-08-21: 200 mg via INTRAVENOUS

## 2012-08-21 MED ORDER — DEXAMETHASONE SODIUM PHOSPHATE 4 MG/ML IJ SOLN
INTRAMUSCULAR | Status: DC | PRN
  Administered 2012-08-21: 4 mg via INTRAVENOUS

## 2012-08-21 MED ORDER — PROMETHAZINE HCL 25 MG/ML IJ SOLN
6.2500 mg | INTRAMUSCULAR | Status: DC | PRN
  Administered 2012-08-21: 12.5 mg via INTRAVENOUS

## 2012-08-21 MED ORDER — DIPHENHYDRAMINE HCL 50 MG/ML IJ SOLN
12.5000 mg | Freq: Four times a day (QID) | INTRAMUSCULAR | Status: DC | PRN
  Administered 2012-08-22: 12.5 mg via INTRAVENOUS
  Filled 2012-08-21: qty 1

## 2012-08-21 MED ORDER — ONDANSETRON HCL 4 MG/2ML IJ SOLN
INTRAMUSCULAR | Status: DC | PRN
  Administered 2012-08-21 (×2): 4 mg via INTRAVENOUS

## 2012-08-21 MED ORDER — LIDOCAINE HCL (CARDIAC) 20 MG/ML IV SOLN
INTRAVENOUS | Status: DC | PRN
  Administered 2012-08-21: 70 mg via INTRAVENOUS

## 2012-08-21 MED ORDER — MIDAZOLAM HCL 5 MG/5ML IJ SOLN
INTRAMUSCULAR | Status: DC | PRN
  Administered 2012-08-21: 2 mg via INTRAVENOUS

## 2012-08-21 MED ORDER — ONDANSETRON HCL 4 MG/2ML IJ SOLN: 4.0000 mg | Freq: Four times a day (QID) | INTRAMUSCULAR | Status: DC | PRN

## 2012-08-21 MED ORDER — NALOXONE HCL 0.4 MG/ML IJ SOLN: 0.4000 mg | INTRAMUSCULAR | Status: DC | PRN

## 2012-08-21 MED ORDER — EPHEDRINE SULFATE 50 MG/ML IJ SOLN
INTRAMUSCULAR | Status: DC | PRN
  Administered 2012-08-21 (×4): 5 mg via INTRAVENOUS

## 2012-08-21 MED ORDER — ROCURONIUM BROMIDE 100 MG/10ML IV SOLN
INTRAVENOUS | Status: DC | PRN
  Administered 2012-08-21: 50 mg via INTRAVENOUS

## 2012-08-21 MED ORDER — HYDROMORPHONE HCL PF 1 MG/ML IJ SOLN
INTRAMUSCULAR | Status: AC
  Filled 2012-08-21: qty 1

## 2012-08-21 MED ORDER — SODIUM CHLORIDE 0.9 % IV SOLN
INTRAVENOUS | Status: AC
  Filled 2012-08-21: qty 500

## 2012-08-21 MED ORDER — PHENYLEPHRINE HCL 10 MG/ML IJ SOLN
INTRAMUSCULAR | Status: DC | PRN
  Administered 2012-08-21 (×2): 40 ug via INTRAVENOUS

## 2012-08-21 MED ORDER — FENTANYL CITRATE 0.05 MG/ML IJ SOLN: 50.0000 ug | Freq: Once | INTRAMUSCULAR | Status: DC

## 2012-08-21 MED ORDER — THROMBIN 20000 UNITS EX SOLR
CUTANEOUS | Status: DC | PRN
  Administered 2012-08-21: 17:00:00 via TOPICAL

## 2012-08-21 MED ORDER — MIDAZOLAM HCL 2 MG/2ML IJ SOLN: 1.0000 mg | INTRAMUSCULAR | Status: DC | PRN

## 2012-08-21 MED ORDER — MORPHINE SULFATE (PF) 1 MG/ML IV SOLN
INTRAVENOUS | Status: DC
  Administered 2012-08-21 – 2012-08-22 (×2): via INTRAVENOUS
  Administered 2012-08-22: 9.86 mg via INTRAVENOUS
  Administered 2012-08-22: 15 mg via INTRAVENOUS
  Administered 2012-08-22: 19.5 mg via INTRAVENOUS
  Administered 2012-08-22: 3 mg via INTRAVENOUS
  Administered 2012-08-22: 26.41 mg via INTRAVENOUS
  Administered 2012-08-22: 10.32 mg via INTRAVENOUS
  Administered 2012-08-22: 13.5 mg via INTRAVENOUS
  Administered 2012-08-23: 29.5 mg via INTRAVENOUS
  Administered 2012-08-23: 16.5 mg via INTRAVENOUS
  Administered 2012-08-23: 19.5 mg via INTRAVENOUS
  Administered 2012-08-23: 1.5 mg via INTRAVENOUS
  Filled 2012-08-21 (×7): qty 25

## 2012-08-21 MED ORDER — OXYCODONE HCL 5 MG/5ML PO SOLN: 5.0000 mg | Freq: Once | ORAL | Status: DC | PRN

## 2012-08-21 MED ORDER — FENTANYL CITRATE 0.05 MG/ML IJ SOLN
INTRAMUSCULAR | Status: DC | PRN
  Administered 2012-08-21: 25 ug via INTRAVENOUS
  Administered 2012-08-21: 100 ug via INTRAVENOUS

## 2012-08-21 SURGICAL SUPPLY — 71 items
BAG DECANTER FOR FLEXI CONT (MISCELLANEOUS) ×2 IMPLANT
BENZOIN TINCTURE PRP APPL 2/3 (GAUZE/BANDAGES/DRESSINGS) ×2 IMPLANT
BLADE SURG ROTATE 9660 (MISCELLANEOUS) ×2 IMPLANT
BRUSH SCRUB EZ PLAIN DRY (MISCELLANEOUS) ×2 IMPLANT
BUR ACORN 6.0 (BURR) ×2 IMPLANT
BUR MATCHSTICK NEURO 3.0 LAGG (BURR) ×2 IMPLANT
BUR ROUND FLUTED 5 RND (BURR) ×2 IMPLANT
CANISTER SUCTION 2500CC (MISCELLANEOUS) ×2 IMPLANT
CLOTH BEACON ORANGE TIMEOUT ST (SAFETY) ×2 IMPLANT
CONT SPEC 4OZ CLIKSEAL STRL BL (MISCELLANEOUS) ×2 IMPLANT
COVER BACK TABLE 24X17X13 BIG (DRAPES) IMPLANT
COVER TABLE BACK 60X90 (DRAPES) ×2 IMPLANT
CROSSLINK DANEK (Orthopedic Implant) ×1 IMPLANT
DRAPE C-ARM 42X72 X-RAY (DRAPES) ×4 IMPLANT
DRAPE LAPAROTOMY 100X72X124 (DRAPES) ×2 IMPLANT
DRAPE POUCH INSTRU U-SHP 10X18 (DRAPES) ×2 IMPLANT
DRAPE PROXIMA HALF (DRAPES) IMPLANT
DRAPE SURG 17X23 STRL (DRAPES) ×8 IMPLANT
ELECT BLADE 4.0 EZ CLEAN MEGAD (MISCELLANEOUS) ×2
ELECT REM PT RETURN 9FT ADLT (ELECTROSURGICAL) ×2
ELECTRODE BLDE 4.0 EZ CLN MEGD (MISCELLANEOUS) ×1 IMPLANT
ELECTRODE REM PT RTRN 9FT ADLT (ELECTROSURGICAL) ×1 IMPLANT
EVACUATOR 1/8 PVC DRAIN (DRAIN) ×2 IMPLANT
GAUZE SPONGE 4X4 16PLY XRAY LF (GAUZE/BANDAGES/DRESSINGS) IMPLANT
GLOVE BIO SURGEON STRL SZ8.5 (GLOVE) ×4 IMPLANT
GLOVE BIOGEL PI IND STRL 7.5 (GLOVE) ×2 IMPLANT
GLOVE BIOGEL PI INDICATOR 7.5 (GLOVE) ×2
GLOVE ECLIPSE 7.5 STRL STRAW (GLOVE) ×10 IMPLANT
GLOVE EXAM NITRILE LRG STRL (GLOVE) IMPLANT
GLOVE EXAM NITRILE MD LF STRL (GLOVE) ×2 IMPLANT
GLOVE EXAM NITRILE XL STR (GLOVE) IMPLANT
GLOVE EXAM NITRILE XS STR PU (GLOVE) IMPLANT
GLOVE INDICATOR 8.0 STRL GRN (GLOVE) ×2 IMPLANT
GLOVE SS BIOGEL STRL SZ 8 (GLOVE) ×2 IMPLANT
GLOVE SUPERSENSE BIOGEL SZ 8 (GLOVE) ×2
GOWN BRE IMP SLV AUR LG STRL (GOWN DISPOSABLE) ×4 IMPLANT
GOWN BRE IMP SLV AUR XL STRL (GOWN DISPOSABLE) ×4 IMPLANT
GOWN STRL REIN 2XL LVL4 (GOWN DISPOSABLE) IMPLANT
KIT BASIN OR (CUSTOM PROCEDURE TRAY) ×2 IMPLANT
KIT INFUSE X SMALL 1.4CC (Orthopedic Implant) ×2 IMPLANT
KIT ROOM TURNOVER OR (KITS) ×2 IMPLANT
MARKER SKIN DUAL TIP RULER LAB (MISCELLANEOUS) ×2 IMPLANT
NEEDLE HYPO 21X1.5 SAFETY (NEEDLE) IMPLANT
NEEDLE HYPO 25X1 1.5 SAFETY (NEEDLE) ×2 IMPLANT
NS IRRIG 1000ML POUR BTL (IV SOLUTION) ×2 IMPLANT
PACK LAMINECTOMY NEURO (CUSTOM PROCEDURE TRAY) ×2 IMPLANT
PAD ARMBOARD 7.5X6 YLW CONV (MISCELLANEOUS) ×6 IMPLANT
PATTIES SURGICAL .5 X1 (DISPOSABLE) IMPLANT
PLATE BN 1.75-2.15XLO BAR (Orthopedic Implant) ×1 IMPLANT
PUTTY 10ML ACTIFUSE ABX (Putty) ×2 IMPLANT
ROD 6.0 (Rod) ×1 IMPLANT
ROD SPNL STRG 500X6XNS (Rod) ×1 IMPLANT
SCREW SET BREAK OFF (Screw) ×12 IMPLANT
SCREW SET SOLERA (Screw) ×2 IMPLANT
SCREW SET SOLERA TI5.5 (Screw) ×2 IMPLANT
SCREW SOLERA 9.5X40 (Screw) ×4 IMPLANT
SPONGE GAUZE 4X4 12PLY (GAUZE/BANDAGES/DRESSINGS) ×2 IMPLANT
SPONGE LAP 4X18 X RAY DECT (DISPOSABLE) IMPLANT
SPONGE NEURO XRAY DETECT 1X3 (DISPOSABLE) IMPLANT
SPONGE SURGIFOAM ABS GEL 100 (HEMOSTASIS) ×2 IMPLANT
STRIP CLOSURE SKIN 1/2X4 (GAUZE/BANDAGES/DRESSINGS) ×2 IMPLANT
SUT VIC AB 1 CT1 18XBRD ANBCTR (SUTURE) ×2 IMPLANT
SUT VIC AB 1 CT1 8-18 (SUTURE) ×2
SUT VIC AB 2-0 CP2 18 (SUTURE) ×4 IMPLANT
SYR 20CC LL (SYRINGE) IMPLANT
SYR 20ML ECCENTRIC (SYRINGE) ×2 IMPLANT
TAPE CLOTH SURG 4X10 WHT LF (GAUZE/BANDAGES/DRESSINGS) ×2 IMPLANT
TOWEL OR 17X24 6PK STRL BLUE (TOWEL DISPOSABLE) ×2 IMPLANT
TOWEL OR 17X26 10 PK STRL BLUE (TOWEL DISPOSABLE) ×2 IMPLANT
TRAY FOLEY CATH 14FRSI W/METER (CATHETERS) IMPLANT
WATER STERILE IRR 1000ML POUR (IV SOLUTION) ×2 IMPLANT

## 2012-08-21 NOTE — H&P (Signed)
Subjective: The patient is a 46 year old black male who I performed a three-level lumbar fusion on years ago. He has had persistent back pain. He was worked up with a lumbar CT which demonstrated a possible pseudoarthrosis at L5-S1. I discussed the various treatment options with the patient including surgery. The patient has weighed the risks, benefits, and alternatives surgery and has decided to proceed with a redo L5-S1 fusion.   Past Medical History  Diagnosis Date  . B12 DEFICIENCY 03/06/2007  . CARDIOMYOPATHY, IDIOPATHIC HYPERTROPHIC 03/06/2007  . DEPRESSION 03/06/2007  . GERD 06/01/2007  . HYPERCHOLESTEROLEMIA 03/06/2007  . HYPERLIPIDEMIA 11/08/2008  . IMPAIRED GLUCOSE TOLERANCE 11/08/2008  . NEPHROLITHIASIS, HX OF 04/07/2010  . 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 OSTEOARTHRITIS 06/01/2007  . Palpitations 10/23/2007  . SYNDROME, CHRONIC PAIN 03/06/2007  . Shortness of breath   . Cancer     prostate ca  . Prostate cancer   . HYPERTENSION 03/06/2007    Dr. Amador Cunas  . Pneumonia     hx of  . SLEEP APNEA 03/06/2007    uses vpap, last sleep study 2011, Dr. Rhona Raider, at [facility]  . Kidney stones     hx of , had surgery for removal  . Dysrhythmia     sees Dr. Jens Som for "palpitations"    Past Surgical History  Procedure Date  . Decompression and fusion     cervicothoracic  . Partial nephrectomy   . Cardiac catheterization   . Lumbar fusion   . Prostate surgery     robotic prostatectomy  . Penile prosthesis implant   . Tonsillectomy   . Leg skin lesion  biopsy / excision     left leg    No Known Allergies  History  Substance Use Topics  . Smoking status: Former Smoker    Quit date: 09/07/1983  . Smokeless tobacco: Never Used  . Alcohol Use: No    Family History  Problem Relation Age of Onset  . Heart disease Neg Hx     family hx CHF  . Kidney disease Neg Hx     family hx   . Cancer Neg Hx     family hx prostate   Prior to Admission medications   Medication Sig Start  Date End Date Taking? Authorizing Provider  albuterol (PROVENTIL HFA;VENTOLIN HFA) 108 (90 BASE) MCG/ACT inhaler Inhale 2 puffs into the lungs every 6 (six) hours as needed. For shortness of breath   Yes Historical Provider, MD  amitriptyline (ELAVIL) 25 MG tablet Take 1 tablet (25 mg total) by mouth at bedtime. 05/30/12  Yes Catarina Hartshorn, MD  Artificial Tear GEL Place 1 drop into both eyes at bedtime. For dry eye   Yes Historical Provider, MD  ARTIFICIAL TEAR OP Place 1 drop into both eyes 2 (two) times daily. Preservative free   Yes Historical Provider, MD  atenolol (TENORMIN) 25 MG tablet Take 25 mg by mouth 2 (two) times daily.   Yes Historical Provider, MD  atorvastatin (LIPITOR) 40 MG tablet Take 40 mg by mouth daily.   Yes Historical Provider, MD  azaTHIOprine (IMURAN) 50 MG tablet Take 50 mg by mouth 2 (two) times daily.    Yes Historical Provider, MD  buPROPion (WELLBUTRIN XL) 300 MG 24 hr tablet Take 300 mg by mouth every morning.   Yes Historical Provider, MD  cyanocobalamin (COBAL-1000) 1000 MCG/ML injection Inject 1,000 mcg into the muscle every 30 (thirty) days.    Yes Historical Provider, MD  cycloSPORINE (RESTASIS) 0.05 % ophthalmic emulsion  Place 1 drop into both eyes 2 (two) times daily.    Yes Historical Provider, MD  diazepam (VALIUM) 5 MG tablet Take 5 mg by mouth 2 (two) times daily as needed. For anxiety 05/26/12  Yes Historical Provider, MD  dicyclomine (BENTYL) 10 MG capsule Take 10 mg by mouth 4 (four) times daily.   Yes Historical Provider, MD  DULoxetine (CYMBALTA) 30 MG capsule Take 30 mg by mouth daily. Along with 60mg  to=90mg    Yes Historical Provider, MD  DULoxetine (CYMBALTA) 60 MG capsule Take 60 mg by mouth daily. Along with 30mg  to =90mg    Yes Historical Provider, MD  esomeprazole (NEXIUM) 40 MG capsule Take 1 capsule (40 mg total) by mouth daily. 07/13/12  Yes Gordy Savers, MD  gabapentin (NEURONTIN) 300 MG capsule Take 900 mg by mouth 4 (four) times daily.     Yes Historical Provider, MD  guaiFENesin (MUCINEX) 600 MG 12 hr tablet Take 600 mg by mouth 2 (two) times daily.    Yes Historical Provider, MD  metaxalone (SKELAXIN) 800 MG tablet Take 800 mg by mouth 3 (three) times daily.  07/03/12  Yes Historical Provider, MD  oxybutynin (DITROPAN) 5 MG tablet Take 5 mg by mouth at bedtime.   Yes Historical Provider, MD  oxyCODONE-acetaminophen (PERCOCET) 10-325 MG per tablet Take 1 tablet by mouth 3 (three) times daily as needed. For pain   Yes Historical Provider, MD  predniSONE (DELTASONE) 5 MG tablet Take 5 mg by mouth daily.    Yes Historical Provider, MD  promethazine (PHENERGAN) 25 MG tablet Take 25 mg by mouth every 6 (six) hours as needed. For nausea   Yes Historical Provider, MD  traMADol (ULTRAM) 50 MG tablet Take 50 mg by mouth every 6 (six) hours as needed. For pain   Yes Historical Provider, MD     Review of Systems  Positive ROS: As above  All other systems have been reviewed and were otherwise negative with the exception of those mentioned in the HPI and as above.  Objective: Vital signs in last 24 hours: Temp:  [97.9 F (36.6 C)] 97.9 F (36.6 C) (12/16 1303) Pulse Rate:  [68] 68  (12/16 1303) Resp:  [20] 20  (12/16 1303) BP: (109)/(73) 109/73 mmHg (12/16 1303) SpO2:  [95 %] 95 % (12/16 1303)  General Appearance: Alert, cooperative, no distress, appears stated age Head: Normocephalic, without obvious abnormality, atraumatic Eyes: PERRL, conjunctiva/corneas clear, EOM's intact, fundi benign, both eyes      Ears: Normal TM's and external ear canals, both ears Throat: Lips, mucosa, and tongue normal; teeth and gums normal Neck: Supple, symmetrical, trachea midline, no adenopathy; thyroid: No enlargement/tenderness/nodules; no carotid bruit or JVD his incision is well-healed. Back: Symmetric, no curvature, ROM normal, no CVA tenderness Lungs: Clear to auscultation bilaterally, respirations unlabored Heart: Regular rate and rhythm,  S1 and S2 normal, no murmur, rub or gallop Abdomen: Soft, non-tender, bowel sounds active all four quadrants, no masses, no organomegaly Extremities: Extremities normal, atraumatic, no cyanosis or edema Pulses: 2+ and symmetric all extremities Skin: Skin color, texture, turgor normal, no rashes or lesions  NEUROLOGIC:   Mental status: alert and oriented, no aphasia, good attention span, Fund of knowledge/ memory ok Motor Exam - grossly normal Sensory Exam - grossly normal Reflexes:  Coordination - grossly normal Gait - grossly normal Balance - grossly normal Cranial Nerves: I: smell Not tested  II: visual acuity  OS: Normal    OD: Normal   II: visual fields  Full to confrontation  II: pupils Equal, round, reactive to light  III,VII: ptosis None  III,IV,VI: extraocular muscles  Full ROM  V: mastication Normal  V: facial light touch sensation  Normal  V,VII: corneal reflex  Present  VII: facial muscle function - upper  Normal  VII: facial muscle function - lower Normal  VIII: hearing Not tested  IX: soft palate elevation  Normal  IX,X: gag reflex Present  XI: trapezius strength  5/5  XI: sternocleidomastoid strength 5/5  XI: neck flexion strength  5/5  XII: tongue strength  Normal    Data Review Lab Results  Component Value Date   WBC 3.5* 08/18/2012   HGB 13.5 08/18/2012   HCT 39.8 08/18/2012   MCV 85.6 08/18/2012   PLT 289 08/18/2012   Lab Results  Component Value Date   NA 138 08/18/2012   K 4.1 08/18/2012   CL 104 08/18/2012   CO2 25 08/18/2012   BUN 13 08/18/2012   CREATININE 0.96 08/18/2012   GLUCOSE 87 08/18/2012   Lab Results  Component Value Date   INR 1.09 08/17/2011    Assessment/Plan: L5-S1 pseudoarthrosis, lumbago: I discussed situation with the patient. I reviewed his imaging studies with him. I've pointed out the abnormalities. I discussed the various treatment options including exploration of his lumbar fusion with a redo L5-S1 agitation and  fusion. I described the surgery to him. I've shown him surgical models. I discussed the risks, pets, and alternatives surgery as well as likelihood of achieving our goals with surgery. I've answered all the patient's questions. He has decided to proceed with the operation.   Derisha Funderburke D 08/21/2012 3:59 PM

## 2012-08-21 NOTE — Consult Note (Addendum)
Anesthesia Chart Review:  Patient is a 46 year old male scheduled as Dr. Lovell Sheehan first add-on today for exploration of fusion with redo L5-S1 instrumentation and posterolateral arthrodesis.  Patient was a late PAT appointment from Friday 08/18/12, and I wasn't given his chart to review until today.  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  Pulmonologist is Dr. Marthe Patch and Cardiologist is Dr. Molli Knock of [facility]. PCP is DR. Eleonore Chiquito.  Pulmonary notes are still pending (re-requested this morning).  EKG on 12/29/11 showed NSR.  Nuclear stress test on 12/30/11 showed: This is interpreted as a normal Lexiscan Myoview study. There is no evidence of ischemia. There is normal left ventricular systolic function with an EF of 66%.  Echo on 12/30/11 showed: Left ventricle: The cavity size was normal. Wall thickness was increased in a pattern of moderate LVH. Systolic function was normal. The estimated ejection fraction was in the range of 60% to 65%.   He also had an echo report from his last [facility] note on 03/15/12 that stated, "LVID 4 cm, Global EF 60-64%, Stroke volume: 75cc, Mitral inflow pattern is consistent with normal left atrial pressure 80/10, 0 grade mitral regurgitation, 0 grade tricuspid regurgitation, Pulmonary artery systolic pressure is estimated at na +5, RV size and function is normal."  Dr. Jorge Ny also indicates at the end of his note, "No evidence of cardiac sarcoidosis at this time" and "Chest pain: Is pleuritic and noncardiac in nature.  No testing needed from a Cardiac standpoint."   By 10/30/07 [facility] notes, he had normal coronaries by cath in 2003--another  note says 1998.  CXR on 12/29/11 showed: Lungs are clear. Heart size is normal. Cardiac and  mediastinal contours are unchanged. No pneumothorax or effusion.   Preoperative labs noted.  I'll follow-up pulmonology records when available.  [name], PA-C 08/21/12 1610  Addendum: 08/21/12 1200 Last pulmonology notes from 11/26/11 reviewed and are on patient's chart.  PFTs were ordered in 6 months--[facility] medical records stated no recent PFT results available.  Patient is on chronic prednisone 5 mg daily.  By notes, his last CT scan showed stable pulmonary nodules, which were demonstrated in the right upper lobe/right middle lobe. There was also a stable pulmonary nodule in the left lung. There was some scattered ground-glass opacity which appeared minimal. There was no significant mediastinal lymphadenopathy but there was one hilar lymph node found in the inferior left hilum.  The lymph node associated with the groundglass opacity was felt likely reactive as the CT was done during an episode of influenza pneumonia.

## 2012-08-21 NOTE — Progress Notes (Signed)
PCA set up but not given to patient

## 2012-08-21 NOTE — Anesthesia Postprocedure Evaluation (Signed)
Anesthesia Post Note  Patient: [name]  Procedure(s) Performed: Procedure(s) (LRB): POSTERIOR LUMBAR FUSION 1 LEVEL (N/A)  Anesthesia type: general  Patient location: PACU  Post pain: Pain level controlled  Post assessment: Patient's Cardiovascular Status Stable  Last Vitals:  Filed Vitals:   08/21/12 2000  BP: 146/73  Pulse: 82  Temp:   Resp: 19    Post vital signs: Reviewed and stable  Level of consciousness: sedated  Complications: No apparent anesthesia complications

## 2012-08-21 NOTE — Transfer of Care (Signed)
Immediate Anesthesia Transfer of Care Note  Patient: [name]  Procedure(s) Performed: Procedure(s) (LRB) with comments: POSTERIOR LUMBAR FUSION 1 LEVEL (N/A) - Exploration of Fusion with Redo of Lumbar Five-Sacral One Instrumentation and Posterolateral Arthrodesis  Patient Location: PACU  Anesthesia Type:General  Level of Consciousness: awake and alert   Airway & Oxygen Therapy: Patient Spontanous Breathing and Patient connected to nasal cannula oxygen  Post-op Assessment: Report given to PACU RN and Post -op Vital signs reviewed and stable  Post vital signs: Reviewed  Complications: No apparent anesthesia complications

## 2012-08-21 NOTE — Anesthesia Preprocedure Evaluation (Addendum)
Anesthesia Evaluation  Patient identified by MRN, date of birth, ID band Patient awake    Reviewed: Allergy & Precautions, H&P , NPO status , Patient's Chart, lab work & pertinent test results  Airway Mallampati: II TM Distance: >3 FB Neck ROM: Full    Dental   Pulmonary shortness of breath, sleep apnea , former smoker,  breath sounds clear to auscultation        Cardiovascular hypertension, Rhythm:Regular Rate:Normal     Neuro/Psych Depression neurosarcoidosis    GI/Hepatic GERD-  ,  Endo/Other    Renal/GU Renal disease     Musculoskeletal   Abdominal (+) + obese,   Peds  Hematology   Anesthesia Other Findings   Reproductive/Obstetrics                          Anesthesia Physical Anesthesia Plan  ASA: III  Anesthesia Plan: General   Post-op Pain Management:    Induction: Intravenous  Airway Management Planned: Oral ETT  Additional Equipment:   Intra-op Plan:   Post-operative Plan: Extubation in OR  Informed Consent: I have reviewed the patients History and Physical, chart, labs and discussed the procedure including the risks, benefits and alternatives for the proposed anesthesia with the patient or authorized representative who has indicated his/her understanding and acceptance.     Plan Discussed with: CRNA and Surgeon  Anesthesia Plan Comments:         Anesthesia Quick Evaluation

## 2012-08-21 NOTE — Progress Notes (Signed)
[facility] hospital medical records, spoke with Victorino Dike, requested copy of patients cardiac cath asap due to surgery today.

## 2012-08-21 NOTE — Anesthesia Procedure Notes (Signed)
Procedure Name: Intubation Date/Time: 08/21/2012 4:25 PM Performed by: Margaree Mackintosh Pre-anesthesia Checklist: Patient identified, Timeout performed, Emergency Drugs available, Suction available and Patient being monitored Patient Re-evaluated:Patient Re-evaluated prior to inductionOxygen Delivery Method: Circle system utilized Preoxygenation: Pre-oxygenation with 100% oxygen Intubation Type: IV induction Ventilation: Mask ventilation without difficulty Laryngoscope Size: Mac and 4 Grade View: Grade II Tube type: Oral Tube size: 7.5 mm Number of attempts: 1 Airway Equipment and Method: Stylet and LTA kit utilized Placement Confirmation: ETT inserted through vocal cords under direct vision,  positive ETCO2 and breath sounds checked- equal and bilateral Secured at: 23 cm Tube secured with: Tape Dental Injury: Teeth and Oropharynx as per pre-operative assessment

## 2012-08-21 NOTE — Preoperative (Signed)
Beta Blockers   Reason not to administer Beta Blockers:Not Applicable 

## 2012-08-21 NOTE — Progress Notes (Signed)
Pt educated with teach back about PCA given PCA clicker to

## 2012-08-21 NOTE — Progress Notes (Signed)
Subjective:  The patient is somnolent but easily arousable. He looks well. He is in no apparent distress.  Objective: Vital signs in last 24 hours: Temp:  [97.9 F (36.6 C)] 97.9 F (36.6 C) (12/16 1303) Pulse Rate:  [68] 68  (12/16 1303) Resp:  [20] 20  (12/16 1303) BP: (109)/(73) 109/73 mmHg (12/16 1303) SpO2:  [95 %] 95 % (12/16 1303)  Intake/Output from previous day:   Intake/Output this shift: Total I/O In: 800 [I.V.:800] Out: -   Physical exam the patient is somnolent but arousable. He is moving his lower extremities well.  Lab Results: No results found for this basename: WBC:2,HGB:2,HCT:2,PLT:2 in the last 72 hours BMET No results found for this basename: NA:2,K:2,CL:2,CO2:2,GLUCOSE:2,BUN:2,CREATININE:2,CALCIUM:2 in the last 72 hours  Studies/Results: Dg Lumbar Spine 2-3 Views  08/21/2012  *RADIOLOGY REPORT*  Clinical Data: L5-S1 revision.  DG C-ARM 1-60 MIN,LUMBAR SPINE - 2-3 VIEW  Comparison:  CT of the 07/26/2012.  Findings: AP and lateral intraoperative views of the lumbosacral spine demonstrate trans pedicle screw fixation at 4 levels, L3-S1. Interbody fusion material.  IMPRESSION: Intraoperative imaging.   Original Report Authenticated By: Jeronimo Greaves, M.D.    Dg C-arm 1-60 Min  08/21/2012  *RADIOLOGY REPORT*  Clinical Data: L5-S1 revision.  DG C-ARM 1-60 MIN,LUMBAR SPINE - 2-3 VIEW  Comparison:  CT of the 07/26/2012.  Findings: AP and lateral intraoperative views of the lumbosacral spine demonstrate trans pedicle screw fixation at 4 levels, L3-S1. Interbody fusion material.  IMPRESSION: Intraoperative imaging.   Original Report Authenticated By: Jeronimo Greaves, M.D.     Assessment/Plan: The patient is doing well.  LOS: 0 days     Lesly Joslyn D 08/21/2012, 7:34 PM

## 2012-08-21 NOTE — Progress Notes (Signed)
Visited by dr,jenkins at bedside

## 2012-08-21 NOTE — Op Note (Signed)
Brief history: The patient is a 46 year old black male who I performed at L3-4, 45 and 51 decompression and fusion on back in 2010. The patient has had persistent back pain. He was worked up with a lumbar MRI and a lumbar CT scan which demonstrated a probable pseudoarthrosis at L5-S1. I discussed the various treatment options with the patient including exploration of his fusion with a possible redo fusion L5-S1. The patient has weighed the risks, benefits, and alternatives surgery and decided proceed with the operation.  Preop diagnosis: L5-S1 pseudoarthrosis, lumbago, lumbar radiculopathy  Postop diagnosis: Same  Procedure: Exploration of lumbar fusion, removal of old hardware, redo L5-S1 posterior lateral arthrodesis with local morselized autograft bone and Actifusebone extender and bone morphogenic protein-soaked collagen sponges, posterior segmental instrumentation from L3-S1 with [employer] titanium pedicle screws and rods.  Surgeon: Dr. Delma Officer  Assistant: Dr. Shirlean Kelly  Anesthesia: Gen. endotracheal  Estimated blood loss: 150 cc  Specimens: None  Drains: One medium Hemovac  Complications: None  Description of procedure: The patient was brought to the operating room by the anesthesia team. General endotracheal anesthesia was induced. The patient was turned to the prone position on the Wilson frame. His lumbosacral region was then shaved with clippers and prepared with Betadine scrub and Betadine solution. Sterile drapes were applied. I then injected the area to be incised with Marcaine with epinephrine solution. I used a scalpel to make a linear midline from approximately L3-S1 incision excising through the patient's prior surgical scar. I used a cautery to perform a bilateral subperiosteal dissection exposing the spinous process of L2, L3 and the sacrum as well as exposing the old hardware. We inserted the versa track retractor for exposure.  We began by exploring his fusion.  I removed the caps from the pedicle screws and a cross connector and then removed the rods. I attempted to independently move the pedicle screws. There screws appeared to be solid except at S1 there is perhaps slight motion but they were not obviously loose. I removed the screws bilaterally at S1.  We now turned our attention to the arthrodesis at L5-S1. I used Leksell nodule were to remove the soft tissue overlying the lateral masses/facets. I then used a high-speed drill to drill with soft tissue and decorticate the L5-S1 lamina pars transverse process upper sacrum etc. I then laid a combination of local morselized autograft bone that we obtained during the decompression as well as Actifuse bone graft extender as well as bone morphogenic protein-soaked collagen sponges over these decorticated posterior lateral structures. This completed the posterior lateral arthrodesis at L5-S1.  We now turned attention to the instrumentation. I used the old holes at S1 and replaced the old pedicle screws with 9.5 x 40 mm pedicle screws. There was a good snug fit of the screws in the S1 pedicles. I then cut and contoured 2 rods to appropriate configuration. We connected the unilateral pedicle screws with the rod from L3-S1 bilaterally. We secured the rod in place with the caps which were tightened appropriately. I then placed a cross connector between the 2 rods and tightened this appropriately this completed the instrumentation. We confirmed the good position of the agitation with intraoperative fluoroscopy.  We then obtained hemostasis using bipolar cautery. I placed a medium Hemovac drain in the epidural space and tunneled it out through a separate stab wound. I then reapproximated the patient's thoracolumbar fascia with interrupted #1 Vicryl suture. We reapproximated the subcutaneous tissue with interrupted 2-0 Vicryl suture. We then  reapproximated the skin with Steri-Strips and benzoin. The wound was then coated with  bacitracin ointment. A sterile dressing was applied. The drapes were removed. The patient was subsequently returned to the supine position where he was extubated by the anesthesia team. He was then transported to the post anesthesia care unit in stable condition. All sponge instrument and needle counts were reportedly correct at the end this case.

## 2012-08-22 LAB — CBC
HCT: 39.9 % (ref 39.0–52.0)
Hemoglobin: 13.5 g/dL (ref 13.0–17.0)
MCH: 29 pg (ref 26.0–34.0)
MCHC: 33.8 g/dL (ref 30.0–36.0)
MCV: 85.6 fL (ref 78.0–100.0)
Platelets: 304 10*3/uL (ref 150–400)
RBC: 4.66 MIL/uL (ref 4.22–5.81)
RDW: 13.5 % (ref 11.5–15.5)
WBC: 7 10*3/uL (ref 4.0–10.5)

## 2012-08-22 LAB — BASIC METABOLIC PANEL
BUN: 10 mg/dL (ref 6–23)
CO2: 23 mEq/L (ref 19–32)
Calcium: 9.2 mg/dL (ref 8.4–10.5)
Chloride: 104 mEq/L (ref 96–112)
Creatinine, Ser: 0.98 mg/dL (ref 0.50–1.35)
GFR calc Af Amer: >90 mL/min (ref >90)
GFR calc non Af Amer: >90 mL/min (ref >90)
Glucose, Bld: 131 mg/dL — ABNORMAL HIGH (ref 70–99)
Potassium: 4.6 mEq/L (ref 3.5–5.1)
Sodium: 138 mEq/L (ref 135–145)

## 2012-08-22 MED ORDER — ALBUTEROL SULFATE HFA 108 (90 BASE) MCG/ACT IN AERS: 2.0000 | INHALATION_SPRAY | RESPIRATORY_TRACT | Status: DC | PRN

## 2012-08-22 MED ORDER — DOCUSATE SODIUM 100 MG PO CAPS
100.0000 mg | ORAL_CAPSULE | Freq: Two times a day (BID) | ORAL | Status: DC
  Administered 2012-08-22 – 2012-08-24 (×6): 100 mg via ORAL
  Filled 2012-08-22 (×3): qty 1

## 2012-08-22 MED ORDER — BUPROPION HCL ER (XL) 300 MG PO TB24
300.0000 mg | ORAL_TABLET | Freq: Every morning | ORAL | Status: DC
  Administered 2012-08-22 – 2012-08-24 (×3): 300 mg via ORAL
  Filled 2012-08-22 (×3): qty 1

## 2012-08-22 MED ORDER — PHENOL 1.4 % MT LIQD
1.0000 | OROMUCOSAL | Status: DC | PRN
  Administered 2012-08-22: 1 via OROMUCOSAL
  Filled 2012-08-22: qty 177

## 2012-08-22 MED ORDER — ALBUTEROL SULFATE HFA 108 (90 BASE) MCG/ACT IN AERS
2.0000 | INHALATION_SPRAY | Freq: Four times a day (QID) | RESPIRATORY_TRACT | Status: DC
  Filled 2012-08-22: qty 6.7

## 2012-08-22 MED ORDER — OXYCODONE-ACETAMINOPHEN 5-325 MG PO TABS
1.0000 | ORAL_TABLET | ORAL | Status: DC | PRN
  Administered 2012-08-24 (×3): 1 via ORAL
  Filled 2012-08-22 (×3): qty 1

## 2012-08-22 MED ORDER — OXYCODONE-ACETAMINOPHEN 10-325 MG PO TABS: 1.0000 | ORAL_TABLET | ORAL | Status: DC | PRN

## 2012-08-22 MED ORDER — ATORVASTATIN CALCIUM 40 MG PO TABS
40.0000 mg | ORAL_TABLET | Freq: Every day | ORAL | Status: DC
  Administered 2012-08-22 – 2012-08-24 (×3): 40 mg via ORAL
  Filled 2012-08-22 (×3): qty 1

## 2012-08-22 MED ORDER — PREDNISONE 5 MG PO TABS
5.0000 mg | ORAL_TABLET | Freq: Every day | ORAL | Status: DC
  Filled 2012-08-22: qty 1

## 2012-08-22 MED ORDER — CEFAZOLIN SODIUM-DEXTROSE 2-3 GM-% IV SOLR
2.0000 g | Freq: Three times a day (TID) | INTRAVENOUS | Status: AC
  Administered 2012-08-22 (×2): 2 g via INTRAVENOUS
  Filled 2012-08-22 (×2): qty 50

## 2012-08-22 MED ORDER — PROMETHAZINE HCL 25 MG PO TABS
25.0000 mg | ORAL_TABLET | Freq: Four times a day (QID) | ORAL | Status: DC | PRN
  Administered 2012-08-22: 25 mg via ORAL
  Filled 2012-08-22: qty 1

## 2012-08-22 MED ORDER — MENTHOL 3 MG MT LOZG: 1.0000 | LOZENGE | OROMUCOSAL | Status: DC | PRN

## 2012-08-22 MED ORDER — ZOLPIDEM TARTRATE 5 MG PO TABS: 5.0000 mg | ORAL_TABLET | Freq: Every evening | ORAL | Status: DC | PRN

## 2012-08-22 MED ORDER — DEXAMETHASONE 4 MG PO TABS
4.0000 mg | ORAL_TABLET | Freq: Four times a day (QID) | ORAL | Status: AC
  Administered 2012-08-22 – 2012-08-23 (×6): 4 mg via ORAL
  Filled 2012-08-22 (×6): qty 1

## 2012-08-22 MED ORDER — GABAPENTIN 300 MG PO CAPS
900.0000 mg | ORAL_CAPSULE | Freq: Three times a day (TID) | ORAL | Status: DC
  Administered 2012-08-22 – 2012-08-24 (×12): 900 mg via ORAL
  Filled 2012-08-22 (×14): qty 3

## 2012-08-22 MED ORDER — PREDNISONE 5 MG PO TABS
5.0000 mg | ORAL_TABLET | Freq: Every day | ORAL | Status: DC
  Administered 2012-08-24: 5 mg via ORAL
  Filled 2012-08-22 (×2): qty 1

## 2012-08-22 MED ORDER — OXYCODONE HCL 5 MG PO TABS
5.0000 mg | ORAL_TABLET | ORAL | Status: DC | PRN
  Administered 2012-08-24: 5 mg via ORAL
  Filled 2012-08-22 (×2): qty 1

## 2012-08-22 MED ORDER — DULOXETINE HCL 30 MG PO CPEP
90.0000 mg | ORAL_CAPSULE | Freq: Every day | ORAL | Status: DC
  Administered 2012-08-22 – 2012-08-24 (×3): 90 mg via ORAL
  Filled 2012-08-22 (×3): qty 3

## 2012-08-22 MED ORDER — ACETAMINOPHEN 325 MG PO TABS: 650.0000 mg | ORAL_TABLET | ORAL | Status: DC | PRN

## 2012-08-22 MED ORDER — CYCLOSPORINE 0.05 % OP EMUL
1.0000 [drp] | Freq: Two times a day (BID) | OPHTHALMIC | Status: DC
  Administered 2012-08-22 (×2): 1 [drp] via OPHTHALMIC
  Administered 2012-08-22: 01:00:00 via OPHTHALMIC
  Administered 2012-08-23 – 2012-08-24 (×3): 1 [drp] via OPHTHALMIC
  Filled 2012-08-22 (×7): qty 1

## 2012-08-22 MED ORDER — DIAZEPAM 5 MG PO TABS
5.0000 mg | ORAL_TABLET | Freq: Four times a day (QID) | ORAL | Status: DC | PRN
  Administered 2012-08-23 – 2012-08-24 (×5): 5 mg via ORAL
  Filled 2012-08-22 (×5): qty 1

## 2012-08-22 MED ORDER — GUAIFENESIN ER 600 MG PO TB12
600.0000 mg | ORAL_TABLET | Freq: Two times a day (BID) | ORAL | Status: DC
  Administered 2012-08-22 – 2012-08-24 (×6): 600 mg via ORAL
  Filled 2012-08-22 (×7): qty 1

## 2012-08-22 MED ORDER — OXYBUTYNIN CHLORIDE 5 MG PO TABS
5.0000 mg | ORAL_TABLET | Freq: Every day | ORAL | Status: DC
  Administered 2012-08-22 – 2012-08-23 (×3): 5 mg via ORAL
  Filled 2012-08-22 (×5): qty 1

## 2012-08-22 MED ORDER — PANTOPRAZOLE SODIUM 40 MG PO TBEC
80.0000 mg | DELAYED_RELEASE_TABLET | Freq: Every day | ORAL | Status: DC
  Administered 2012-08-22 – 2012-08-24 (×3): 80 mg via ORAL
  Filled 2012-08-22: qty 1
  Filled 2012-08-22: qty 2
  Filled 2012-08-22 (×2): qty 1

## 2012-08-22 MED ORDER — LACTATED RINGERS IV SOLN
INTRAVENOUS | Status: DC
  Administered 2012-08-22: 01:00:00 via INTRAVENOUS

## 2012-08-22 MED ORDER — ATENOLOL 25 MG PO TABS
25.0000 mg | ORAL_TABLET | Freq: Two times a day (BID) | ORAL | Status: DC
  Administered 2012-08-22 – 2012-08-24 (×6): 25 mg via ORAL
  Filled 2012-08-22 (×7): qty 1

## 2012-08-22 MED ORDER — DULOXETINE HCL 60 MG PO CPEP: 60.0000 mg | ORAL_CAPSULE | Freq: Every day | ORAL | Status: DC

## 2012-08-22 MED ORDER — ACETAMINOPHEN 650 MG RE SUPP: 650.0000 mg | RECTAL | Status: DC | PRN

## 2012-08-22 MED ORDER — DICYCLOMINE HCL 10 MG PO CAPS
10.0000 mg | ORAL_CAPSULE | Freq: Three times a day (TID) | ORAL | Status: DC
Start: 2012-08-22 — End: 2012-08-24
  Administered 2012-08-22 – 2012-08-24 (×12): 10 mg via ORAL
  Filled 2012-08-22 (×14): qty 1

## 2012-08-22 MED ORDER — ONDANSETRON HCL 4 MG/2ML IJ SOLN: 4.0000 mg | INTRAMUSCULAR | Status: DC | PRN

## 2012-08-22 MED ORDER — DIAZEPAM 5 MG PO TABS: 5.0000 mg | ORAL_TABLET | Freq: Four times a day (QID) | ORAL | Status: DC | PRN

## 2012-08-22 MED ORDER — AMITRIPTYLINE HCL 25 MG PO TABS
25.0000 mg | ORAL_TABLET | Freq: Every day | ORAL | Status: DC
  Administered 2012-08-22 – 2012-08-23 (×3): 25 mg via ORAL
  Filled 2012-08-22 (×4): qty 1

## 2012-08-22 MED ORDER — DEXAMETHASONE SODIUM PHOSPHATE 4 MG/ML IJ SOLN
4.0000 mg | Freq: Four times a day (QID) | INTRAMUSCULAR | Status: AC
  Filled 2012-08-22 (×4): qty 1

## 2012-08-22 NOTE — Clinical Social Work Note (Signed)
Clinical Social Work   CSW reviewed chart. PT/OT are not recommending any follow at discharge. CSW will update RNCM. CSW is signing off as this time as no further needs are identified. Please reconsult if a need arises prior to discharge.   Dede Query, MSW, [facility] [phone]

## 2012-08-22 NOTE — Progress Notes (Signed)
Orthopedic Tech Progress Note Patient Details:  [name] 10/24/1965 [id number] Brace order completed by [employer] Patient ID: [name], male   DOB: 01/01/1966, 46 y.o.   MRN: [id number]   [name] 08/22/2012, 12:15 PM

## 2012-08-22 NOTE — Evaluation (Signed)
Occupational Therapy Evaluation Patient Details Name: [name] MRN: [id number] DOB: 05/31/1966 Today's Date: 08/22/2012 Time: 1478-2956 OT Time Calculation (min): 13 min  OT Assessment / Plan / Recommendation Clinical Impression  Pt is recovering from L3-S1 PLIF.  He had back surgery 2 years ago and is knowledgeable in back precautions.  Anticipate pt will be able to d/c home withou OT.  Will follow to practice shower transfer.    OT Assessment  Patient needs continued OT Services    Follow Up Recommendations  No OT follow up    Barriers to Discharge      Equipment Recommendations  None recommended by OT    Recommendations for Other Services    Frequency  Min 2X/week    Precautions / Restrictions Precautions Precautions: Back Precaution Booklet Issued: Yes (comment) Precaution Comments: pt familar due to previous back sx in 2010 Required Braces or Orthoses: Spinal Brace Spinal Brace: Lumbar corset Restrictions Weight Bearing Restrictions: No   Pertinent Vitals/Pain 6/10 back, PCA pump, repositioned    ADL  Eating/Feeding: Independent Where Assessed - Eating/Feeding: Chair Grooming: Supervision/safety;Wash/dry hands Where Assessed - Grooming: Unsupported standing Upper Body Bathing: Set up Where Assessed - Upper Body Bathing: Unsupported sitting Lower Body Bathing: Supervision/safety Where Assessed - Lower Body Bathing: Supported sit to stand;Unsupported sitting Upper Body Dressing: Set up Where Assessed - Upper Body Dressing: Unsupported sitting Lower Body Dressing: Supervision/safety (able to cross foot over opposite knee) Where Assessed - Lower Body Dressing: Unsupported sitting;Supported sit to stand Toilet Transfer: Supervision/safety Toilet Transfer Method: Sit to stand Toileting - Architect and Hygiene: Modified independent Where Assessed - Toileting Clothing Manipulation and Hygiene: Standing Transfers/Ambulation Related to ADLs: min  guard assist with RW, pt using PCA pump, slightly unsteady ADL Comments: Pt is knowledgeable in back precautions related to ADL from previous back surgery.  Able to access feet without bending.      OT Diagnosis: Generalized weakness;Acute pain  OT Problem List: Impaired balance (sitting and/or standing);Obesity;Pain OT Treatment Interventions: Self-care/ADL training;Patient/family education   OT Goals Acute Rehab OT Goals OT Goal Formulation: With patient Time For Goal Achievement: 08/29/12 ADL Goals Pt Will Perform Tub/Shower Transfer: Tub transfer;Ambulation;Maintaining back safety precautions;with supervision ADL Goal: Tub/Shower Transfer - Progress: Goal set today  Visit Information  Last OT Received On: 08/22/12 Assistance Needed: +1 PT/OT Co-Evaluation/Treatment: Yes    Subjective Data  Subjective: "I had back surgery in 2010." Patient Stated Goal: Return to PLOF.   Prior Functioning     Home Living Lives With: Family Available Help at Discharge: Family;Available 24 hours/day Type of Home: House Home Access: Level entry Home Layout: Two level Alternate Level Stairs-Number of Steps: 12 Alternate Level Stairs-Rails: Left Bathroom Shower/Tub: Walk-in shower;Tub/shower unit Bathroom Toilet: Handicapped height Bathroom Accessibility: Yes How Accessible: Accessible via walker Home Adaptive Equipment:  (none) Prior Function Level of Independence: Independent Able to Take Stairs?: Yes Driving: Yes Comments: pt dealing with 10+/10 back pain prior to surgery Communication Communication: No difficulties Dominant Hand: Right         Vision/Perception     Cognition  Overall Cognitive Status: Appears within functional limits for tasks assessed/performed Arousal/Alertness: Awake/alert Orientation Level: Oriented X4 / Intact Behavior During Session: [facility] for tasks performed    Extremity/Trunk Assessment Right Upper Extremity Assessment RUE ROM/Strength/Tone:  Within functional levels Left Upper Extremity Assessment LUE ROM/Strength/Tone: Within functional levels Right Lower Extremity Assessment RLE ROM/Strength/Tone: Within functional levels RLE Sensation:  (mild numbness, much improved from prior to sx)  Left Lower Extremity Assessment LLE ROM/Strength/Tone: Within functional levels Trunk Assessment Trunk Assessment: Normal     Mobility Bed Mobility Bed Mobility: Rolling Left;Left Sidelying to Sit Rolling Left: 5: Supervision;With rail Left Sidelying to Sit: 4: Min guard Details for Bed Mobility Assistance: pt with good technique Transfers Transfers: Sit to Stand;Stand to Sit Sit to Stand: 4: Min guard;With upper extremity assist;From bed Stand to Sit: 4: Min guard;To elevated surface;To bed;With upper extremity assist Details for Transfer Assistance: pt adhered to back prec.     Shoulder Instructions     Exercise     Balance     End of Session OT - End of Session Activity Tolerance: Patient limited by fatigue Patient left: in chair;with call bell/phone within reach  GO     Evern Bio 08/22/2012, 11:48 AM [phone]

## 2012-08-22 NOTE — Evaluation (Signed)
Physical Therapy Evaluation Patient Details Name: [name] MRN: [id number] DOB: 07/03/1966 Today's Date: 08/22/2012 Time: 7829-5621 PT Time Calculation (min): 34 min  PT Assessment / Plan / Recommendation Clinical Impression  Pt s/p PLF mobilizing well. Anticipate pt to be safe to d/c home with assist of family. Pt to benefit from skilled PT while in hospital to achieve maximal functional independence prior to d/c home. will work on transfers, ambulation, and stair negotiation.    PT Assessment  Patient needs continued PT services    Follow Up Recommendations  No PT follow up;Supervision - Intermittent    Does the patient have the potential to tolerate intense rehabilitation      Barriers to Discharge None      Equipment Recommendations  Rolling walker with 5" wheels (3-n-1 commode, many not need RW if con't to mobilize well)    Recommendations for Other Services     Frequency Min 5X/week    Precautions / Restrictions Precautions Precautions: Back Precaution Booklet Issued: Yes (comment) Precaution Comments: pt familar due to previous back sx in 2010 Required Braces or Orthoses: Spinal Brace Spinal Brace: Lumbar corset Restrictions Weight Bearing Restrictions: No   Pertinent Vitals/Pain 6/10 surgical back pain      Mobility  Bed Mobility Bed Mobility: Rolling Left;Left Sidelying to Sit Rolling Left: 5: Supervision;With rail Left Sidelying to Sit: 4: Min guard Details for Bed Mobility Assistance: pt with good technique Transfers Transfers: Sit to Stand;Stand to Sit Sit to Stand: 4: Min guard;With upper extremity assist;From bed Stand to Sit: 4: Min guard;To elevated surface;To bed;With upper extremity assist Details for Transfer Assistance: pt adhered to back prec. Ambulation/Gait Ambulation/Gait Assistance: 4: Min guard Ambulation Distance (Feet): 125 Feet Assistive device: Rolling walker Ambulation/Gait Assistance Details: minimal use of RW, pt c/o  dizziness initially but then ceased Gait Pattern: Step-through pattern Gait velocity: guarded General Gait Details: WFLfor post PLF Stairs: No    Shoulder Instructions     Exercises     PT Diagnosis: Difficulty walking  PT Problem List: Decreased activity tolerance;Decreased mobility PT Treatment Interventions: Gait training;Stair training;Functional mobility training   PT Goals Acute Rehab PT Goals PT Goal Formulation: With patient Time For Goal Achievement: 08/29/12 Potential to Achieve Goals: Good Pt will Roll Supine to Left Side: with modified independence PT Goal: Rolling Supine to Left Side - Progress: Goal set today Pt will go Supine/Side to Sit: with modified independence;with HOB 0 degrees PT Goal: Supine/Side to Sit - Progress: Goal set today Pt will go Sit to Stand: with modified independence;with upper extremity assist PT Goal: Sit to Stand - Progress: Goal set today Pt will Ambulate: >150 feet;with supervision;with rolling walker PT Goal: Ambulate - Progress: Goal set today Pt will Go Up / Down Stairs: Flight;with supervision;with rail(s) PT Goal: Up/Down Stairs - Progress: Goal set today Additional Goals Additional Goal #1: Pt independent with recall 3/3 precautions and 100% compliant. PT Goal: Additional Goal #1 - Progress: Goal set today  Visit Information  Last PT Received On: 08/22/12 Assistance Needed: +1 PT/OT Co-Evaluation/Treatment: Yes    Subjective Data  Subjective: Pt received supine in bed agreeable to PT. Patient Stated Goal: home   Prior Functioning  Home Living Lives With: Family Available Help at Discharge: Family;Available 24 hours/day Type of Home: House Home Access: Level entry Home Layout: Two level Alternate Level Stairs-Number of Steps: 12 Alternate Level Stairs-Rails: Left Bathroom Shower/Tub: Walk-in shower;Tub/shower unit Bathroom Toilet: Handicapped height Bathroom Accessibility: Yes How Accessible: Accessible via  walker Home Adaptive Equipment:  (none) Prior Function Level of Independence: Independent Able to Take Stairs?: Yes Driving: Yes Comments: pt dealing with 10+/10 back pain prior to surgery Communication Communication: No difficulties Dominant Hand: Right    Cognition  Overall Cognitive Status: Appears within functional limits for tasks assessed/performed Arousal/Alertness: Awake/alert Orientation Level: Oriented X4 / Intact Behavior During Session: [facility] for tasks performed    Extremity/Trunk Assessment Right Upper Extremity Assessment RUE ROM/Strength/Tone: Within functional levels Left Upper Extremity Assessment LUE ROM/Strength/Tone: Within functional levels Right Lower Extremity Assessment RLE ROM/Strength/Tone: Within functional levels RLE Sensation:  (mild numbness, much improved from prior to sx) Left Lower Extremity Assessment LLE ROM/Strength/Tone: Within functional levels Trunk Assessment Trunk Assessment: Normal   Balance    End of Session PT - End of Session Equipment Utilized During Treatment: Gait belt;Back brace Activity Tolerance: Patient tolerated treatment well Patient left: in chair;with call bell/phone within reach Nurse Communication: Mobility status  GP     Marcene Brawn 08/22/2012, 10:58 AM  Lewis Shock, PT, DPT Pager #: [phone] Office #: [phone]

## 2012-08-22 NOTE — Progress Notes (Signed)
Patient ID: [name], male   DOB: [birth year], 46 y.o.   MRN: [id number] Subjective:  The patient is alert and pleasant. He looks well. He says his back and legs already feel better since surgery.  Objective: Vital signs in last 24 hours: Temp:  [97.3 F (36.3 C)-98.6 F (37 C)] 98.4 F (36.9 C) (12/17 0605) Pulse Rate:  [68-102] 94  (12/17 0605) Resp:  [14-22] 18  (12/17 0605) BP: (107-146)/(57-88) 107/57 mmHg (12/17 0605) SpO2:  [95 %-100 %] 99 % (12/17 0605) FiO2 (%):  [99 %] 99 % (12/16 2030) Weight:  [101.4 kg (223 lb 8.7 oz)] 101.4 kg (223 lb 8.7 oz) (12/16 2137)  Intake/Output from previous day: 12/16 0701 - 12/17 0700 In: 1800 [I.V.:1800] Out: 2170 [Urine:1900; Drains:270] Intake/Output this shift:    Physical exam the patient is alert and oriented. His strength is grossly normal his lower extremities.  Lab Results:  [facility] 08/22/12 0535  WBC 7.0  HGB 13.5  HCT 39.9  PLT 304   BMET  Basename 08/22/12 0535  NA 138  K 4.6  CL 104  CO2 23  GLUCOSE 131*  BUN 10  CREATININE 0.98  CALCIUM 9.2    Studies/Results: Dg Lumbar Spine 2-3 Views  08/21/2012  *RADIOLOGY REPORT*  Clinical Data: L5-S1 revision.  DG C-ARM 1-60 MIN,LUMBAR SPINE - 2-3 VIEW  Comparison:  CT of the 07/26/2012.  Findings: AP and lateral intraoperative views of the lumbosacral spine demonstrate trans pedicle screw fixation at 4 levels, L3-S1. Interbody fusion material.  IMPRESSION: Intraoperative imaging.   Original Report Authenticated By: Jeronimo Greaves, M.D.    Dg C-arm 1-60 Min  08/21/2012  *RADIOLOGY REPORT*  Clinical Data: L5-S1 revision.  DG C-ARM 1-60 MIN,LUMBAR SPINE - 2-3 VIEW  Comparison:  CT of the 07/26/2012.  Findings: AP and lateral intraoperative views of the lumbosacral spine demonstrate trans pedicle screw fixation at 4 levels, L3-S1. Interbody fusion material.  IMPRESSION: Intraoperative imaging.   Original Report Authenticated By: Jeronimo Greaves, M.D.      Assessment/Plan: Postop day 1: The patient is doing well. We will mobilize him with PT and OT. I will order a lumbosacral corset from [employer].  LOS: 1 day     Jayli Fogleman D 08/22/2012, 7:48 AM

## 2012-08-22 NOTE — Clinical Social Work Note (Signed)
Clinical Social Work   CSW received a consult for SNF. CSW reviewed chart and discussed pt with RN. Awaiting PT/OT evals for discharge recommendations. CSW will assess for SNF, if appropriate. Please call with any urgent needs. CSW will continue to follow.   Dede Query, MSW, [facility] [phone]

## 2012-08-22 NOTE — Progress Notes (Signed)
UR COMPLETED  

## 2012-08-23 MED ORDER — OXYCODONE-ACETAMINOPHEN 10-325 MG PO TABS: 1.0000 | ORAL_TABLET | ORAL | Status: DC | PRN

## 2012-08-23 MED ORDER — DIAZEPAM 5 MG PO TABS: 5.0000 mg | ORAL_TABLET | Freq: Four times a day (QID) | ORAL | Status: DC | PRN

## 2012-08-23 MED ORDER — MORPHINE SULFATE 2 MG/ML IJ SOLN: 2.0000 mg | INTRAMUSCULAR | Status: DC | PRN

## 2012-08-23 NOTE — Progress Notes (Signed)
Patient request to not have IV restarted. States MD told him he is going home in the am.

## 2012-08-23 NOTE — Progress Notes (Signed)
Occupational Therapy Treatment and Discharge Patient Details Name: [name] MRN: [id number] DOB: 1966-08-12 Today's Date: 08/23/2012 Time: 4098-1191 OT Time Calculation (min): 14 min  OT Assessment / Plan / Recommendation Comments on Treatment Session Practiced tub transfer and use of tub seat with back.  No further OT needs.  Pt is knowledgeable in back precautions and at a supervision level in ADL. Pt reports he is functioning better than he was prior to admission.  Will have assist of wife at home as needed.    Follow Up Recommendations  No OT follow up    Barriers to Discharge       Equipment Recommendations  None recommended by OT    Recommendations for Other Services    Frequency     Plan Discharge plan remains appropriate    Precautions / Restrictions Precautions Precautions: Fall;Back Required Braces or Orthoses: Spinal Brace Spinal Brace: Lumbar corset   Pertinent Vitals/Pain Back pain, did not rate, premedicated, repositioned    ADL  Tub/Shower Transfer: Performed;Supervision/safety Tub/Shower Transfer Method: Science writer: Shower seat with back Equipment Used: Gait belt;Back brace;Rolling walker Transfers/Ambulation Related to ADLs: supervision with and without RW    OT Diagnosis:    OT Problem List:   OT Treatment Interventions:     OT Goals Acute Rehab OT Goals OT Goal Formulation: With patient ADL Goals Pt Will Perform Tub/Shower Transfer: Tub transfer;Ambulation;Maintaining back safety precautions;with supervision ADL Goal: Tub/Shower Transfer - Progress: Met  Visit Information  Last OT Received On: 08/23/12 Assistance Needed: +1 PT/OT Co-Evaluation/Treatment: Yes    Subjective Data      Prior Functioning       Cognition  Overall Cognitive Status: Appears within functional limits for tasks assessed/performed Arousal/Alertness: Awake/alert Orientation Level: Oriented X4 / Intact Behavior During Session:  [facility] for tasks performed    Mobility  Shoulder Instructions Bed Mobility Bed Mobility: Not assessed Transfers Transfers: Sit to Stand;Stand to Sit Sit to Stand: 5: Supervision;Other (comment) (from shower seat) Stand to Sit: To chair/3-in-1;Other (comment);5: Supervision (to shower seat)       Exercises      Balance     End of Session OT - End of Session Activity Tolerance: Patient limited by fatigue Patient left: in chair;with call bell/phone within reach;with family/visitor present  GO     Evern Bio 08/23/2012, 10:42 AM [phone]

## 2012-08-23 NOTE — Progress Notes (Signed)
Patient ID: [name], male   DOB: 10/04/1965, 46 y.o.   MRN: [id number] Subjective:  The patient is alert and pleasant. He looks well. He wants to go home tomorrow.  Objective: Vital signs in last 24 hours: Temp:  [97.6 F (36.4 C)-98.3 F (36.8 C)] 97.7 F (36.5 C) (12/18 1735) Pulse Rate:  [83-97] 96  (12/18 1735) Resp:  [16-20] 18  (12/18 1735) BP: (110-135)/(57-76) 135/76 mmHg (12/18 1735) SpO2:  [94 %-99 %] 97 % (12/18 1735)  Intake/Output from previous day: 12/17 0701 - 12/18 0700 In: 2190 [P.O.:840; I.V.:1350] Out: 3475 [Urine:3325; Drains:150] Intake/Output this shift: Total I/O In: -  Out: 1500 [Urine:1500]  Physical exam patient is alert and oriented. His strength is normal his lower extremities.  Lab Results:  [facility] 08/22/12 0535  WBC 7.0  HGB 13.5  HCT 39.9  PLT 304   BMET  Basename 08/22/12 0535  NA 138  K 4.6  CL 104  CO2 23  GLUCOSE 131*  BUN 10  CREATININE 0.98  CALCIUM 9.2    Studies/Results: Dg Lumbar Spine 2-3 Views  08/21/2012  *RADIOLOGY REPORT*  Clinical Data: L5-S1 revision.  DG C-ARM 1-60 MIN,LUMBAR SPINE - 2-3 VIEW  Comparison:  CT of the 07/26/2012.  Findings: AP and lateral intraoperative views of the lumbosacral spine demonstrate trans pedicle screw fixation at 4 levels, L3-S1. Interbody fusion material.  IMPRESSION: Intraoperative imaging.   Original Report Authenticated By: Jeronimo Greaves, M.D.    Dg C-arm 1-60 Min  08/21/2012  *RADIOLOGY REPORT*  Clinical Data: L5-S1 revision.  DG C-ARM 1-60 MIN,LUMBAR SPINE - 2-3 VIEW  Comparison:  CT of the 07/26/2012.  Findings: AP and lateral intraoperative views of the lumbosacral spine demonstrate trans pedicle screw fixation at 4 levels, L3-S1. Interbody fusion material.  IMPRESSION: Intraoperative imaging.   Original Report Authenticated By: Jeronimo Greaves, M.D.     Assessment/Plan: Postop day #2: The patient is doing well. I will discontinue the patient's drain. We will plan to send  him home tomorrow. I gave the patient and his wife discharge instructions and have answered all her questions.  LOS: 2 days     [name] D 08/23/2012, 6:13 PM

## 2012-08-23 NOTE — Progress Notes (Signed)
Physical Therapy Treatment Patient Details Name: [name] MRN: [id number] DOB: 06/13/1966 Today's Date: 08/23/2012 Time: 6962-9528 PT Time Calculation (min): 19 min  PT Assessment / Plan / Recommendation Comments on Treatment Session  Pt progressing well. Anticipate pt to con't to be safe for d/c home with family tomorrow.    Follow Up Recommendations  No PT follow up;Supervision - Intermittent     Does the patient have the potential to tolerate intense rehabilitation     Barriers to Discharge        Equipment Recommendations  Rolling walker with 5" wheels    Recommendations for Other Services    Frequency Min 5X/week   Plan Discharge plan remains appropriate;Frequency remains appropriate    Precautions / Restrictions Precautions Precautions: Fall;Back Precaution Booklet Issued: Yes (comment) Precaution Comments: able to recall 2/3, v/c's for arching, re-educated patient' Required Braces or Orthoses: Spinal Brace Spinal Brace: Lumbar corset Restrictions Weight Bearing Restrictions: No   Pertinent Vitals/Pain 6/10 surgical back pain, minimal LE pain    Mobility  Bed Mobility Bed Mobility: Not assessed Transfers Transfers: Sit to Stand;Stand to Sit Sit to Stand: 5: Supervision;Other (comment) (from shower seat) Stand to Sit: To chair/3-in-1;Other (comment);5: Supervision (to shower seat) Ambulation/Gait Ambulation/Gait Assistance: 4: Min guard Ambulation Distance (Feet): 300 Feet Assistive device: Rolling walker Ambulation/Gait Assistance Details: 1/2 distance with RW, and 1/2 distance without AD. pt with increased stabiltiy and less guarded with RW. no LOB with either RW or no AD Gait Pattern: Step-through pattern General Gait Details: WFLfor post PLF Stairs: Yes Stairs Assistance: 4: Min assist Stairs Assistance Details (indicate cue type and reason): minA via HHA Stair Management Technique: One rail Right (L HHA) Number of Stairs: 5  Wheelchair  Mobility Wheelchair Mobility: No    Exercises     PT Diagnosis:    PT Problem List:   PT Treatment Interventions:     PT Goals Acute Rehab PT Goals PT Goal: Sit to Stand - Progress: Progressing toward goal PT Goal: Ambulate - Progress: Progressing toward goal PT Goal: Up/Down Stairs - Progress: Progressing toward goal  Visit Information  Last PT Received On: 08/23/12 Assistance Needed: +1    Subjective Data  Subjective: Pt received sitting up in chair with family present   Cognition  Overall Cognitive Status: Appears within functional limits for tasks assessed/performed Arousal/Alertness: Awake/alert Orientation Level: Oriented X4 / Intact Behavior During Session: [facility] for tasks performed    Balance  Balance Balance Assessed: No  End of Session PT - End of Session Equipment Utilized During Treatment: Gait belt;Back brace Activity Tolerance: Patient tolerated treatment well Patient left: in chair;with call bell/phone within reach Nurse Communication: Mobility status   GP     Marcene Brawn 08/23/2012, 11:19 AM  Lewis Shock, PT, DPT Pager #: [phone] Office #: [phone]

## 2012-08-23 NOTE — Plan of Care (Signed)
Problem: Consults Goal: Diagnosis - Spinal Surgery Outcome: Completed/Met Date Met:  08/23/12 [place]/Lumbar Spine Fusion

## 2012-08-24 NOTE — Progress Notes (Signed)
Pt discharged home AVS and discharged instruction given with prescription pt demonstrate understanding pt spouse brought in home  Rolling walker as he previously  Had one in September 2013  Condition prior to discharge is stable.

## 2012-08-24 NOTE — Care Management Note (Signed)
   CARE MANAGEMENT NOTE 08/24/2012  Patient:  LORAN, FLEET   Account Number:  [id number]  Date Initiated:  08/23/2012  Documentation initiated by:  Ronny Flurry  Subjective/Objective Assessment:     Action/Plan:   Anticipated DC Date:  08/24/2012   Anticipated DC Plan:  HOME/SELF CARE         Choice offered to / List presented to:     DME arranged  WALKER      DME agency  [facility]        Status of service:  Completed, signed off Medicare Important Message given?   (If response is "NO", the following Medicare IM given date fields will be blank) Date Medicare IM given:   Date Additional Medicare IM given:    Discharge Disposition:  HOME/SELF CARE  Per UR Regulation:    If discussed at Long Length of Stay Meetings, dates discussed:    Comments:  08/24/2012 Pt is not eligible for a rolling walker per [facility] as his insurance has provided one recently for him. Pt notified and agrees that he will try to borrow one from his mother, he does not wish to purchase a walker at this time. Johny Shock RN MPH [phone]

## 2012-08-24 NOTE — Progress Notes (Signed)
Physical Therapy Treatment Patient Details Name: [name] MRN: [id number] DOB: 01-22-1966 Today's Date: 08/24/2012 Time: 3875-6433 PT Time Calculation (min): 12 min  PT Assessment / Plan / Recommendation Comments on Treatment Session  Pt cont's to move well at this date.  Still on track to safely d/c home with family.      Follow Up Recommendations  No PT follow up;Supervision - Intermittent     Does the patient have the potential to tolerate intense rehabilitation     Barriers to Discharge        Equipment Recommendations  Rolling walker with 5" wheels;Other (comment) (may not need RW if cont to mobilize well)    Recommendations for Other Services    Frequency Min 5X/week   Plan Discharge plan remains appropriate;Frequency remains appropriate    Precautions / Restrictions Precautions Precautions: Fall;Back Precaution Comments: Pt verbalized 2/3 back precautions.  Cueing to recall "no arching".   Required Braces or Orthoses: Spinal Brace Spinal Brace: Lumbar corset   Pertinent Vitals/Pain Pt c/o "burning" at incision site.  RN made aware.      Mobility  Bed Mobility Bed Mobility: Not assessed Details for Bed Mobility Assistance: Pt did not physically perform but was able to verbalize proper technique.   Transfers Transfers: Sit to Stand;Stand to Sit Sit to Stand: With upper extremity assist;From chair/3-in-1;With armrests;6: Modified independent (Device/Increase time) Stand to Sit: With upper extremity assist;6: Modified independent (Device/Increase time);With armrests;To chair/3-in-1 Ambulation/Gait Ambulation/Gait Assistance: 5: Supervision Ambulation Distance (Feet): 500 Feet Assistive device: Rolling walker Ambulation/Gait Assistance Details: Supervision for safety due to pt c/o burning in back.  Steady & using RW safely.   Gait Pattern: Step-through pattern General Gait Details: WFLfor post PLF Stairs: No     PT Goals Acute Rehab PT Goals Time For  Goal Achievement: 08/29/12 Potential to Achieve Goals: Good Pt will Roll Supine to Left Side: with modified independence Pt will go Supine/Side to Sit: with modified independence;with HOB 0 degrees Pt will go Sit to Stand: with modified independence;with upper extremity assist PT Goal: Sit to Stand - Progress: Met Pt will Ambulate: >150 feet;with supervision;with rolling walker PT Goal: Ambulate - Progress: Met Pt will Go Up / Down Stairs: Flight;with supervision;with rail(s) Additional Goals Additional Goal #1: Pt independent with recall 3/3 precautions and 100% compliant. PT Goal: Additional Goal #1 - Progress: Progressing toward goal  Visit Information  Last PT Received On: 08/24/12 Assistance Needed: +1    Subjective Data      Cognition  Overall Cognitive Status: Appears within functional limits for tasks assessed/performed Arousal/Alertness: Awake/alert Orientation Level: Oriented X4 / Intact Behavior During Session: [facility] for tasks performed    Balance     End of Session PT - End of Session Equipment Utilized During Treatment: Gait belt;Back brace Activity Tolerance: Patient tolerated treatment well Patient left: in chair;with call bell/phone within reach Nurse Communication: Mobility status;Other (comment) (c/o burning at incision site. )    Verdell Face, PTA [phone] 08/24/2012

## 2012-08-26 ENCOUNTER — Other Ambulatory Visit: Payer: Self-pay | Admitting: Internal Medicine

## 2012-09-04 NOTE — Discharge Summary (Signed)
Physician Discharge Summary  Patient ID: [name] MRN: [id number] DOB/AGE: 47/21/1967 47 y.o.  Admit date: 08/21/2012 Discharge date: 09/04/2012  Admission Diagnoses: L5-S1 pseudoarthrosis, lumbago, lumbar radiculopathy.  Discharge Diagnoses: The same Active Problems:  * No active hospital problems. *    Discharged Condition: good  Hospital Course: I admitted the patient to [facility] on 08/21/2012. On that day I performed a exploration of his lumbar fusion with a revision of his L5-S1 fusion. The surgery went well.  The patient's postoperative course was unremarkable and he was discharged home on 08/24/2012.  Consults: None Significant Diagnostic Studies: None Treatments: Exploration of lumbar fusion, redo L5-S1 posterior lateral arthrodesis and instrumentation. Discharge Exam: Blood pressure 135/70, pulse 64, temperature 97.8 F (36.6 C), temperature source Oral, resp. rate 18, height 5\' 9"  (1.753 m), weight 101.4 kg (223 lb 8.7 oz), SpO2 95.00%. Patient is alert and oriented his strength is normal.  Disposition: Home  Discharge Orders    Future Appointments: Provider: Department: Dept Phone: Center:   01/10/2013 8:30 AM Gordy Savers, MD [facility] [place] [phone] [facility]       Medication List     As of 09/04/2012  7:07 PM    STOP taking these medications         metaxalone 800 MG tablet   Commonly known as: SKELAXIN      traMADol 50 MG tablet   Commonly known as: ULTRAM      TAKE these medications         albuterol 108 (90 BASE) MCG/ACT inhaler   Commonly known as: PROVENTIL HFA;VENTOLIN HFA   Inhale 2 puffs into the lungs every 6 (six) hours as needed. For shortness of breath      amitriptyline 25 MG tablet   Commonly known as: ELAVIL   Take 1 tablet (25 mg total) by mouth at bedtime.      Artificial Tear Gel   Place 1 drop into both eyes at bedtime. For dry eye      ARTIFICIAL TEAR OP   Place 1 drop  into both eyes 2 (two) times daily. Preservative free      atenolol 25 MG tablet   Commonly known as: TENORMIN   Take 25 mg by mouth 2 (two) times daily.      atorvastatin 40 MG tablet   Commonly known as: LIPITOR   Take 40 mg by mouth daily.      azaTHIOprine 50 MG tablet   Commonly known as: IMURAN   Take 50 mg by mouth 2 (two) times daily.      buPROPion 300 MG 24 hr tablet   Commonly known as: WELLBUTRIN XL   Take 300 mg by mouth every morning.      COBAL-1000 1000 MCG/ML injection   Generic drug: cyanocobalamin   Inject 1,000 mcg into the muscle every 30 (thirty) days.      cycloSPORINE 0.05 % ophthalmic emulsion   Commonly known as: RESTASIS   Place 1 drop into both eyes 2 (two) times daily.      diazepam 5 MG tablet   Commonly known as: VALIUM   Take 5 mg by mouth 2 (two) times daily as needed. For anxiety      diazepam 5 MG tablet   Commonly known as: VALIUM   Take 1 tablet (5 mg total) by mouth every 6 (six) hours as needed.      dicyclomine 10 MG capsule   Commonly known as: BENTYL  Take 10 mg by mouth 4 (four) times daily.      DULoxetine 30 MG capsule   Commonly known as: CYMBALTA   Take 30 mg by mouth daily. Along with 60mg  to=90mg       DULoxetine 60 MG capsule   Commonly known as: CYMBALTA   Take 60 mg by mouth daily. Along with 30mg  to =90mg       esomeprazole 40 MG capsule   Commonly known as: NEXIUM   Take 1 capsule (40 mg total) by mouth daily.      gabapentin 300 MG capsule   Commonly known as: NEURONTIN   Take 900 mg by mouth 4 (four) times daily.      guaiFENesin 600 MG 12 hr tablet   Commonly known as: MUCINEX   Take 600 mg by mouth 2 (two) times daily.      oxybutynin 5 MG tablet   Commonly known as: DITROPAN   Take 5 mg by mouth at bedtime.      oxyCODONE-acetaminophen 10-325 MG per tablet   Commonly known as: PERCOCET   Take 1 tablet by mouth every 4 (four) hours as needed for pain.      predniSONE 5 MG tablet   Commonly  known as: DELTASONE   Take 5 mg by mouth daily.      promethazine 25 MG tablet   Commonly known as: PHENERGAN   Take 25 mg by mouth every 6 (six) hours as needed. For nausea         Signed: Cristi Loron 09/04/2012, 7:07 PM

## 2012-09-05 ENCOUNTER — Other Ambulatory Visit: Payer: Self-pay | Admitting: Internal Medicine

## 2012-09-09 ENCOUNTER — Emergency Department (HOSPITAL_COMMUNITY): Payer: Medicare Other

## 2012-09-09 ENCOUNTER — Emergency Department (HOSPITAL_COMMUNITY)
Admission: EM | Admit: 2012-09-09 | Discharge: 2012-09-09 | Disposition: A | Discharge status: Home / Self Care | Payer: Medicare Other | Attending: Emergency Medicine | Admitting: Emergency Medicine

## 2012-09-09 ENCOUNTER — Encounter (HOSPITAL_COMMUNITY): Payer: Self-pay | Admitting: Adult Health

## 2012-09-09 DIAGNOSIS — Z86718 Personal history of other venous thrombosis and embolism: Secondary | ICD-10-CM | POA: Insufficient documentation

## 2012-09-09 DIAGNOSIS — Z8701 Personal history of pneumonia (recurrent): Secondary | ICD-10-CM | POA: Insufficient documentation

## 2012-09-09 DIAGNOSIS — M2550 Pain in unspecified joint: Secondary | ICD-10-CM | POA: Diagnosis not present

## 2012-09-09 DIAGNOSIS — IMO0001 Reserved for inherently not codable concepts without codable children: Secondary | ICD-10-CM | POA: Insufficient documentation

## 2012-09-09 DIAGNOSIS — R079 Chest pain, unspecified: Secondary | ICD-10-CM | POA: Diagnosis not present

## 2012-09-09 DIAGNOSIS — E785 Hyperlipidemia, unspecified: Secondary | ICD-10-CM | POA: Insufficient documentation

## 2012-09-09 DIAGNOSIS — F3289 Other specified depressive episodes: Secondary | ICD-10-CM | POA: Insufficient documentation

## 2012-09-09 DIAGNOSIS — Z8719 Personal history of other diseases of the digestive system: Secondary | ICD-10-CM | POA: Insufficient documentation

## 2012-09-09 DIAGNOSIS — Z9889 Other specified postprocedural states: Secondary | ICD-10-CM | POA: Insufficient documentation

## 2012-09-09 DIAGNOSIS — I10 Essential (primary) hypertension: Secondary | ICD-10-CM | POA: Insufficient documentation

## 2012-09-09 DIAGNOSIS — G4730 Sleep apnea, unspecified: Secondary | ICD-10-CM | POA: Insufficient documentation

## 2012-09-09 DIAGNOSIS — R110 Nausea: Secondary | ICD-10-CM | POA: Diagnosis not present

## 2012-09-09 DIAGNOSIS — Z8546 Personal history of malignant neoplasm of prostate: Secondary | ICD-10-CM | POA: Diagnosis not present

## 2012-09-09 DIAGNOSIS — Z862 Personal history of diseases of the blood and blood-forming organs and certain disorders involving the immune mechanism: Secondary | ICD-10-CM | POA: Diagnosis not present

## 2012-09-09 DIAGNOSIS — R0789 Other chest pain: Secondary | ICD-10-CM | POA: Insufficient documentation

## 2012-09-09 DIAGNOSIS — R062 Wheezing: Secondary | ICD-10-CM | POA: Insufficient documentation

## 2012-09-09 DIAGNOSIS — R0602 Shortness of breath: Secondary | ICD-10-CM | POA: Diagnosis not present

## 2012-09-09 DIAGNOSIS — Z87442 Personal history of urinary calculi: Secondary | ICD-10-CM | POA: Insufficient documentation

## 2012-09-09 DIAGNOSIS — J3489 Other specified disorders of nose and nasal sinuses: Secondary | ICD-10-CM | POA: Diagnosis not present

## 2012-09-09 DIAGNOSIS — Z8679 Personal history of other diseases of the circulatory system: Secondary | ICD-10-CM | POA: Insufficient documentation

## 2012-09-09 DIAGNOSIS — M1990 Unspecified osteoarthritis, unspecified site: Secondary | ICD-10-CM | POA: Diagnosis not present

## 2012-09-09 DIAGNOSIS — Z87891 Personal history of nicotine dependence: Secondary | ICD-10-CM | POA: Insufficient documentation

## 2012-09-09 DIAGNOSIS — E7800 Pure hypercholesterolemia, unspecified: Secondary | ICD-10-CM | POA: Insufficient documentation

## 2012-09-09 DIAGNOSIS — R059 Cough, unspecified: Secondary | ICD-10-CM | POA: Diagnosis not present

## 2012-09-09 DIAGNOSIS — K219 Gastro-esophageal reflux disease without esophagitis: Secondary | ICD-10-CM | POA: Insufficient documentation

## 2012-09-09 DIAGNOSIS — R509 Fever, unspecified: Secondary | ICD-10-CM | POA: Insufficient documentation

## 2012-09-09 DIAGNOSIS — D869 Sarcoidosis, unspecified: Secondary | ICD-10-CM | POA: Diagnosis not present

## 2012-09-09 DIAGNOSIS — R51 Headache: Secondary | ICD-10-CM | POA: Insufficient documentation

## 2012-09-09 DIAGNOSIS — Z79899 Other long term (current) drug therapy: Secondary | ICD-10-CM | POA: Insufficient documentation

## 2012-09-09 DIAGNOSIS — Z8639 Personal history of other endocrine, nutritional and metabolic disease: Secondary | ICD-10-CM | POA: Insufficient documentation

## 2012-09-09 DIAGNOSIS — F329 Major depressive disorder, single episode, unspecified: Secondary | ICD-10-CM | POA: Insufficient documentation

## 2012-09-09 DIAGNOSIS — R5381 Other malaise: Secondary | ICD-10-CM | POA: Insufficient documentation

## 2012-09-09 DIAGNOSIS — G894 Chronic pain syndrome: Secondary | ICD-10-CM | POA: Insufficient documentation

## 2012-09-09 DIAGNOSIS — J069 Acute upper respiratory infection, unspecified: Secondary | ICD-10-CM

## 2012-09-09 DIAGNOSIS — R05 Cough: Secondary | ICD-10-CM | POA: Insufficient documentation

## 2012-09-09 DIAGNOSIS — IMO0002 Reserved for concepts with insufficient information to code with codable children: Secondary | ICD-10-CM | POA: Insufficient documentation

## 2012-09-09 HISTORY — DX: Acute embolism and thrombosis of unspecified deep veins of unspecified lower extremity: I82.409

## 2012-09-09 LAB — BASIC METABOLIC PANEL
BUN: 9 mg/dL (ref 6–23)
CO2: 28 mEq/L (ref 19–32)
Calcium: 9.6 mg/dL (ref 8.4–10.5)
Chloride: 100 mEq/L (ref 96–112)
Creatinine, Ser: 0.94 mg/dL (ref 0.50–1.35)
GFR calc Af Amer: >90 mL/min (ref >90)
GFR calc non Af Amer: >90 mL/min (ref >90)
Glucose, Bld: 90 mg/dL (ref 70–99)
Potassium: 4.3 mEq/L (ref 3.5–5.1)
Sodium: 137 mEq/L (ref 135–145)

## 2012-09-09 LAB — CBC
HCT: 39.7 % (ref 39.0–52.0)
Hemoglobin: 13 g/dL (ref 13.0–17.0)
MCH: 28.3 pg (ref 26.0–34.0)
MCHC: 32.7 g/dL (ref 30.0–36.0)
MCV: 86.5 fL (ref 78.0–100.0)
Platelets: 405 10*3/uL — ABNORMAL HIGH (ref 150–400)
RBC: 4.59 MIL/uL (ref 4.22–5.81)
RDW: 13.7 % (ref 11.5–15.5)
WBC: 3.6 10*3/uL — ABNORMAL LOW (ref 4.0–10.5)

## 2012-09-09 LAB — POCT I-STAT TROPONIN I: Troponin i, poc: 0 ng/mL (ref 0.00–0.08)

## 2012-09-09 MED ORDER — ALBUTEROL SULFATE HFA 108 (90 BASE) MCG/ACT IN AERS
2.0000 | INHALATION_SPRAY | Freq: Once | RESPIRATORY_TRACT | Status: AC
  Administered 2012-09-09: 2 via RESPIRATORY_TRACT
  Filled 2012-09-09: qty 6.7

## 2012-09-09 MED ORDER — HYDROCOD POLST-CHLORPHEN POLST 10-8 MG/5ML PO LQCR: 5.0000 mL | Freq: Two times a day (BID) | ORAL | Status: DC | PRN

## 2012-09-09 MED ORDER — SODIUM CHLORIDE 0.9 % IV BOLUS (SEPSIS)
1000.0000 mL | Freq: Once | INTRAVENOUS | Status: AC
  Administered 2012-09-09: 1000 mL via INTRAVENOUS

## 2012-09-09 MED ORDER — PREDNISONE 20 MG PO TABS
60.0000 mg | ORAL_TABLET | Freq: Once | ORAL | Status: AC
  Administered 2012-09-09: 60 mg via ORAL
  Filled 2012-09-09: qty 3

## 2012-09-09 MED ORDER — ONDANSETRON HCL 4 MG/2ML IJ SOLN
4.0000 mg | Freq: Once | INTRAMUSCULAR | Status: AC
  Administered 2012-09-09: 4 mg via INTRAVENOUS
  Filled 2012-09-09: qty 2

## 2012-09-09 MED ORDER — ALBUTEROL SULFATE (5 MG/ML) 0.5% IN NEBU
5.0000 mg | INHALATION_SOLUTION | Freq: Once | RESPIRATORY_TRACT | Status: AC
  Administered 2012-09-09: 5 mg via RESPIRATORY_TRACT
  Filled 2012-09-09: qty 1

## 2012-09-09 MED ORDER — PREDNISONE 10 MG PO TABS: ORAL_TABLET | ORAL | Status: DC

## 2012-09-09 MED ORDER — IOHEXOL 350 MG/ML SOLN
100.0000 mL | Freq: Once | INTRAVENOUS | Status: AC | PRN
  Administered 2012-09-09: 100 mL via INTRAVENOUS

## 2012-09-09 NOTE — ED Notes (Signed)
Pt called nurses station stating he is feeling nauseous.

## 2012-09-09 NOTE — ED Notes (Signed)
Patient states he has shortness of breath, diaphoresis, nausea and chest pressure with exertion. He states he has recently had a fever.

## 2012-09-09 NOTE — ED Provider Notes (Signed)
History     CSN: [id number]  Arrival date & time 09/09/12  4098   First MD Initiated Contact with Patient 09/09/12 2009      Chief Complaint  Patient presents with  . Shortness of Breath  . Chest Pain    (Consider location/radiation/quality/duration/timing/severity/associated sxs/prior treatment) HPI [name] is a 47 y.o. male who presents to ED with complaint with fever, chills, chest pain, shortness of breath,  Congestion, cough. States symptoms started about 3-4 days ago. States fevers at home up to 101.2. Nasal congestion, cough. States chest pain is constant, worse with deep breathing and laying down flat. States feels short of breath. Denies neck pain or stiffness, denies abdominal pain, denies n/v/d. Pt is status post lumbar surgery 2 wks ago.   Past Medical History  Diagnosis Date  . B12 DEFICIENCY 03/06/2007  . CARDIOMYOPATHY, IDIOPATHIC HYPERTROPHIC 03/06/2007  . DEPRESSION 03/06/2007  . GERD 06/01/2007  . HYPERCHOLESTEROLEMIA 03/06/2007  . HYPERLIPIDEMIA 11/08/2008  . IMPAIRED GLUCOSE TOLERANCE 11/08/2008  . NEPHROLITHIASIS, HX OF 04/07/2010  . 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 OSTEOARTHRITIS 06/01/2007  . Palpitations 10/23/2007  . SYNDROME, CHRONIC PAIN 03/06/2007  . Shortness of breath   . Cancer     prostate ca  . Prostate cancer   . HYPERTENSION 03/06/2007    Dr. Amador Cunas  . Pneumonia     hx of  . SLEEP APNEA 03/06/2007    uses vpap, last sleep study 2011, Dr. Rhona Raider, at [facility]  . Kidney stones     hx of , had surgery for removal  . Dysrhythmia     sees Dr. Jens Som for "palpitations"  . DVT (deep venous thrombosis)     Past Surgical History  Procedure Date  . Decompression and fusion     cervicothoracic  . Partial nephrectomy   . Cardiac catheterization   . Lumbar fusion   . Prostate surgery     robotic prostatectomy  . Penile prosthesis implant   . Tonsillectomy   . Leg skin lesion  biopsy / excision     left leg    Family History  Problem  Relation Age of Onset  . Heart disease Neg Hx     family hx CHF  . Kidney disease Neg Hx     family hx   . Cancer Neg Hx     family hx prostate    History  Substance Use Topics  . Smoking status: Former Smoker    Quit date: 09/07/1983  . Smokeless tobacco: Never Used  . Alcohol Use: No      Review of Systems  Constitutional: Positive for fever, chills and fatigue.  HENT: Positive for congestion and rhinorrhea. Negative for sore throat, neck pain and neck stiffness.   Respiratory: Positive for cough, chest tightness, shortness of breath and wheezing.   Cardiovascular: Positive for chest pain. Negative for palpitations and leg swelling.  Gastrointestinal: Negative.   Genitourinary: Negative for dysuria and flank pain.  Musculoskeletal: Positive for myalgias and arthralgias.  Skin: Negative.   Neurological: Positive for headaches. Negative for dizziness and weakness.  Hematological: Does not bruise/bleed easily.  Psychiatric/Behavioral: Negative.     Allergies  Review of patient's allergies indicates no known allergies.  Home Medications   Current Outpatient Rx  Name  Route  Sig  Dispense  Refill  . ALBUTEROL SULFATE HFA 108 (90 BASE) MCG/ACT IN AERS   Inhalation   Inhale 2 puffs into the lungs every 6 (six) hours as needed. For shortness  of breath         . AMITRIPTYLINE HCL 25 MG PO TABS   Oral   Take 1 tablet (25 mg total) by mouth at bedtime.   30 tablet   0   . ARTIFICIAL TEAR OP GEL   Both Eyes   Place 1 drop into both eyes at bedtime. For dry eye         . ARTIFICIAL TEAR OP   Both Eyes   Place 1 drop into both eyes 2 (two) times daily. Preservative free         . ATENOLOL 25 MG PO TABS   Oral   Take 25 mg by mouth 2 (two) times daily.         . ATORVASTATIN CALCIUM 40 MG PO TABS   Oral   Take 40 mg by mouth daily.         . AZATHIOPRINE 50 MG PO TABS   Oral   Take 50 mg by mouth 2 (two) times daily.          . BUPROPION HCL ER  (XL) 300 MG PO TB24   Oral   Take 300 mg by mouth every morning.         Marland Kitchen CYANOCOBALAMIN 1000 MCG/ML IJ SOLN   Intramuscular   Inject 1,000 mcg into the muscle every 30 (thirty) days.          . CYCLOSPORINE 0.05 % OP EMUL   Both Eyes   Place 1 drop into both eyes 2 (two) times daily.          Marland Kitchen DIAZEPAM 5 MG PO TABS   Oral   Take 5 mg by mouth 2 (two) times daily as needed. For anxiety         . DIAZEPAM 5 MG PO TABS   Oral   Take 1 tablet (5 mg total) by mouth every 6 (six) hours as needed.   100 tablet   1   . DICYCLOMINE HCL 10 MG PO CAPS   Oral   Take 10 mg by mouth 4 (four) times daily.         . DULOXETINE HCL 30 MG PO CPEP   Oral   Take 30 mg by mouth daily. Along with 60mg  to=90mg          . DULOXETINE HCL 60 MG PO CPEP   Oral   Take 60 mg by mouth daily. Along with 30mg  to =90mg          . GABAPENTIN 300 MG PO CAPS   Oral   Take 900 mg by mouth 4 (four) times daily.          . GUAIFENESIN ER 600 MG PO TB12   Oral   Take 600 mg by mouth 2 (two) times daily.          Marland Kitchen NEXIUM 40 MG PO CPDR      TAKE ONE CAPSULE BY MOUTH DAILY   90 capsule   1     Dispense as written.    CYCLE FILL MEDICATION. Authorization is required f ...   . OXYBUTYNIN CHLORIDE 5 MG PO TABS   Oral   Take 5 mg by mouth at bedtime.         . OXYCODONE-ACETAMINOPHEN 10-325 MG PO TABS   Oral   Take 1 tablet by mouth every 4 (four) hours as needed for pain.   100 tablet   0   . PREDNISONE 5 MG PO TABS  Oral   Take 5 mg by mouth daily.          Marland Kitchen PROMETHAZINE HCL 25 MG PO TABS   Oral   Take 25 mg by mouth every 6 (six) hours as needed. For nausea         . PROMETHAZINE HCL 25 MG PO TABS      TAKE ONE TABLET BY MOUTH EVERY 6 HOURS AS NEEDED FOR NAUSEA   15 tablet   0     CYCLE FILL MEDICATION. Authorization is required f ...   . TRAMADOL HCL 50 MG PO TABS      TAKE ONE TABLET BY MOUTH EVERY 6 HOURS AS NEEDED FOR PAIN   90 tablet   1      CYCLE FILL MEDICATION. Authorization is required f ...     BP 112/73  Pulse 78  Temp 97.8 F (36.6 C) (Oral)  Resp 22  Wt 225 lb (102.059 kg)  SpO2 97%  Physical Exam  Nursing note and vitals reviewed. Constitutional: He is oriented to person, place, and time. He appears well-developed and well-nourished. No distress.  HENT:  Head: Normocephalic and atraumatic.  Right Ear: External ear normal.  Left Ear: External ear normal.  Mouth/Throat: Oropharynx is clear and moist.       Clear nasal congestion  Eyes: Conjunctivae normal are normal.  Neck: Neck supple.  Cardiovascular: Normal rate, regular rhythm and normal heart sounds.   Pulmonary/Chest: Effort normal. No respiratory distress. He has no wheezes. He has no rales.       Diminished breath sounds bilaterally  Abdominal: Soft. Bowel sounds are normal. He exhibits no distension. There is no tenderness. There is no rebound.  Musculoskeletal: He exhibits no edema.       No calf tenderness  Neurological: He is alert and oriented to person, place, and time.  Skin: Skin is warm and dry.  Psychiatric: He has a normal mood and affect.    ED Course  Procedures (including critical care time)  Pt with URI symptoms and Chest pain and shortness of breath. Recent back surgery two weeks ago. Worrisome for PE given pleuritic nature of pain. Will get labs, Ct angio, ECG.    Date: 09/09/2012  Rate: 75  Rhythm: normal sinus rhythm  QRS Axis: normal  Intervals: normal  ST/T Wave abnormalities: normal  Conduction Disutrbances:none  Narrative Interpretation:   Old EKG Reviewed: none available  Results for orders placed during the hospital encounter of 09/09/12  CBC      Component Value Range   WBC 3.6 (*) 4.0 - 10.5 K/uL   RBC 4.59  4.22 - 5.81 MIL/uL   Hemoglobin 13.0  13.0 - 17.0 g/dL   HCT 56.2  13.0 - 86.5 %   MCV 86.5  78.0 - 100.0 fL   MCH 28.3  26.0 - 34.0 pg   MCHC 32.7  30.0 - 36.0 g/dL   RDW 78.4  69.6 - 29.5 %    Platelets 405 (*) 150 - 400 K/uL  BASIC METABOLIC PANEL      Component Value Range   Sodium 137  135 - 145 mEq/L   Potassium 4.3  3.5 - 5.1 mEq/L   Chloride 100  96 - 112 mEq/L   CO2 28  19 - 32 mEq/L   Glucose, Bld 90  70 - 99 mg/dL   BUN 9  6 - 23 mg/dL   Creatinine, Ser 2.84  0.50 - 1.35 mg/dL   Calcium  9.6  8.4 - 10.5 mg/dL   GFR calc non Af Amer >90  >90 mL/min   GFR calc Af Amer >90  >90 mL/min  POCT I-STAT TROPONIN I      Component Value Range   Troponin i, poc 0.00  0.00 - 0.08 ng/mL   Comment 3            Dg Chest 2 View  09/09/2012  *RADIOLOGY REPORT*  Clinical Data: Short of breath  CHEST - 2 VIEW  Comparison: 12/29/2011  Findings: Heart size and pulmonary vascularity are normal. Negative for heart failure.  Negative for infiltrate effusion or mass lesion. Right lower hilar adenopathy is unchanged from the CT of 08/18/2011.  History of sarcoid.  IMPRESSION: No acute abnormality.   Original Report Authenticated By: Janeece Riggers, M.D.      Filed Vitals:   09/09/12 1934  BP: 112/73  Pulse: 78  Temp: 97.8 F (36.6 C)  Resp: 22         1. URI, acute   2. Chest pain   3. Sarcoidosis       MDM  Pt with URI symptoms, chest pain and pressure, cough, congestion. He is 2 wks post lumbar surgery. Pt non toxic appearing. He is not hypoxic, not tachycardic, not tachypnec. Reports fever up to 101 at home, afebrile here. Labs unremarkable. CT angio obtained to r/o PE given high risk, and it is negative. I suspect pt's symptoms are in part due to a URI possibly influenza, and partially due to his sarcoidosis. Pt given IV fluids in ED, prednisone 60mg  Po and neb treatment. Pt will be d/c home with symptomatic tx and follow up.         Lottie Mussel, PA 09/09/12 2307  Lottie Mussel, PA 09/10/12 [phone]

## 2012-09-09 NOTE — ED Notes (Signed)
Family at bedside. 

## 2012-09-09 NOTE — ED Notes (Signed)
Post back surgery from 12/16,  Presents with SOB and pain with inspiration, headache and chills. C/o bilateral  chest pain, worse on let side and feeling like something is sitting on chest, pain is worse with inspiration.  Pain and SOB began last night.  Afebrile at triage, HR 78, RR 22, 97% RA. HX of DVTs 6 years ago, hx of palpitations . Denies blood thinners. Breath sounds diminished.

## 2012-09-10 NOTE — ED Provider Notes (Signed)
Medical screening examination/treatment/procedure(s) were performed by non-physician practitioner and as supervising physician I was immediately available for consultation/collaboration.  Sully Manzi T Felisa Zechman, MD 09/10/12 1740 

## 2012-09-12 ENCOUNTER — Other Ambulatory Visit: Payer: Self-pay | Admitting: *Deleted

## 2012-09-12 MED ORDER — AMITRIPTYLINE HCL 25 MG PO TABS: 25.0000 mg | ORAL_TABLET | Freq: Every day | ORAL | Status: DC

## 2012-09-22 DIAGNOSIS — M545 Low back pain, unspecified: Secondary | ICD-10-CM | POA: Diagnosis not present

## 2012-09-22 DIAGNOSIS — IMO0002 Reserved for concepts with insufficient information to code with codable children: Secondary | ICD-10-CM | POA: Diagnosis not present

## 2012-09-26 ENCOUNTER — Emergency Department (HOSPITAL_COMMUNITY): Payer: Medicare Other

## 2012-09-26 ENCOUNTER — Encounter (HOSPITAL_COMMUNITY): Payer: Self-pay | Admitting: Emergency Medicine

## 2012-09-26 ENCOUNTER — Emergency Department (HOSPITAL_COMMUNITY)
Admission: EM | Admit: 2012-09-26 | Discharge: 2012-09-26 | Disposition: A | Discharge status: Home / Self Care | Payer: Medicare Other | Attending: Emergency Medicine | Admitting: Emergency Medicine

## 2012-09-26 DIAGNOSIS — Z8639 Personal history of other endocrine, nutritional and metabolic disease: Secondary | ICD-10-CM | POA: Insufficient documentation

## 2012-09-26 DIAGNOSIS — E7800 Pure hypercholesterolemia, unspecified: Secondary | ICD-10-CM | POA: Insufficient documentation

## 2012-09-26 DIAGNOSIS — Z8701 Personal history of pneumonia (recurrent): Secondary | ICD-10-CM | POA: Diagnosis not present

## 2012-09-26 DIAGNOSIS — M545 Low back pain, unspecified: Secondary | ICD-10-CM | POA: Insufficient documentation

## 2012-09-26 DIAGNOSIS — Z9889 Other specified postprocedural states: Secondary | ICD-10-CM | POA: Insufficient documentation

## 2012-09-26 DIAGNOSIS — Z86718 Personal history of other venous thrombosis and embolism: Secondary | ICD-10-CM | POA: Diagnosis not present

## 2012-09-26 DIAGNOSIS — F329 Major depressive disorder, single episode, unspecified: Secondary | ICD-10-CM | POA: Insufficient documentation

## 2012-09-26 DIAGNOSIS — M549 Dorsalgia, unspecified: Secondary | ICD-10-CM

## 2012-09-26 DIAGNOSIS — Z79899 Other long term (current) drug therapy: Secondary | ICD-10-CM | POA: Insufficient documentation

## 2012-09-26 DIAGNOSIS — K219 Gastro-esophageal reflux disease without esophagitis: Secondary | ICD-10-CM | POA: Diagnosis not present

## 2012-09-26 DIAGNOSIS — Z8679 Personal history of other diseases of the circulatory system: Secondary | ICD-10-CM | POA: Diagnosis not present

## 2012-09-26 DIAGNOSIS — Z87891 Personal history of nicotine dependence: Secondary | ICD-10-CM | POA: Diagnosis not present

## 2012-09-26 DIAGNOSIS — G4730 Sleep apnea, unspecified: Secondary | ICD-10-CM | POA: Insufficient documentation

## 2012-09-26 DIAGNOSIS — G894 Chronic pain syndrome: Secondary | ICD-10-CM | POA: Diagnosis not present

## 2012-09-26 DIAGNOSIS — I10 Essential (primary) hypertension: Secondary | ICD-10-CM | POA: Insufficient documentation

## 2012-09-26 DIAGNOSIS — IMO0002 Reserved for concepts with insufficient information to code with codable children: Secondary | ICD-10-CM | POA: Diagnosis not present

## 2012-09-26 DIAGNOSIS — F3289 Other specified depressive episodes: Secondary | ICD-10-CM | POA: Insufficient documentation

## 2012-09-26 DIAGNOSIS — Z862 Personal history of diseases of the blood and blood-forming organs and certain disorders involving the immune mechanism: Secondary | ICD-10-CM | POA: Diagnosis not present

## 2012-09-26 DIAGNOSIS — Z87442 Personal history of urinary calculi: Secondary | ICD-10-CM | POA: Diagnosis not present

## 2012-09-26 DIAGNOSIS — Z8669 Personal history of other diseases of the nervous system and sense organs: Secondary | ICD-10-CM | POA: Insufficient documentation

## 2012-09-26 DIAGNOSIS — Z8546 Personal history of malignant neoplasm of prostate: Secondary | ICD-10-CM | POA: Insufficient documentation

## 2012-09-26 MED ORDER — DIAZEPAM 5 MG PO TABS
5.0000 mg | ORAL_TABLET | Freq: Once | ORAL | Status: AC
  Administered 2012-09-26: 5 mg via ORAL
  Filled 2012-09-26: qty 1

## 2012-09-26 MED ORDER — PREDNISONE 20 MG PO TABS: 20.0000 mg | ORAL_TABLET | Freq: Every day | ORAL | Status: DC

## 2012-09-26 MED ORDER — OXYCODONE HCL 5 MG PO TABS
5.0000 mg | ORAL_TABLET | Freq: Once | ORAL | Status: AC
  Administered 2012-09-26: 5 mg via ORAL
  Filled 2012-09-26: qty 1

## 2012-09-26 MED ORDER — OXYCODONE-ACETAMINOPHEN 5-325 MG PO TABS
1.0000 | ORAL_TABLET | Freq: Once | ORAL | Status: AC
  Administered 2012-09-26: 1 via ORAL
  Filled 2012-09-26: qty 1

## 2012-09-26 NOTE — ED Provider Notes (Signed)
Medical screening examination/treatment/procedure(s) were performed by non-physician practitioner and as supervising physician I was immediately available for consultation/collaboration.   Joya Gaskins, MD 09/26/12 [phone]

## 2012-09-26 NOTE — ED Notes (Signed)
States had back surgery in dec and states he was playing w/ kids and hurt his back last night has some scrstch marks on rt back

## 2012-09-26 NOTE — ED Provider Notes (Signed)
History     CSN: [id number]  Arrival date & time 09/26/12  1003   First MD Initiated Contact with Patient 09/26/12 1021      No chief complaint on file.   (Consider location/radiation/quality/duration/timing/severity/associated sxs/prior treatment) HPI Comments: [name] is a 47 y.o. male with recent history of back surgery (Dr. Lovell Sheehan on 08/21/2013) presents emergency department with a chief complaint of low back pain.  Onset of symptoms began this morning.  Patient denies any trauma but does report twisting to hard as a likely etiology.  This morning he woke up and his lumbar area was inflamed.  Patient has not taken his pain medication today.  Patient denies any numbness weakness or tingling of extremities, fever, night sweats or chills.  Pain severity is 8/10 without radiation.  The history is provided by the patient.    Past Medical History  Diagnosis Date  . B12 DEFICIENCY 03/06/2007  . CARDIOMYOPATHY, IDIOPATHIC HYPERTROPHIC 03/06/2007  . DEPRESSION 03/06/2007  . GERD 06/01/2007  . HYPERCHOLESTEROLEMIA 03/06/2007  . HYPERLIPIDEMIA 11/08/2008  . IMPAIRED GLUCOSE TOLERANCE 11/08/2008  . NEPHROLITHIASIS, HX OF 04/07/2010  . 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 OSTEOARTHRITIS 06/01/2007  . Palpitations 10/23/2007  . SYNDROME, CHRONIC PAIN 03/06/2007  . Shortness of breath   . Cancer     prostate ca  . Prostate cancer   . HYPERTENSION 03/06/2007    Dr. Amador Cunas  . Pneumonia     hx of  . SLEEP APNEA 03/06/2007    uses vpap, last sleep study 2011, Dr. Rhona Raider, at [facility]  . Kidney stones     hx of , had surgery for removal  . Dysrhythmia     sees Dr. Jens Som for "palpitations"  . DVT (deep venous thrombosis)     Past Surgical History  Procedure Date  . Decompression and fusion     cervicothoracic  . Partial nephrectomy   . Cardiac catheterization   . Lumbar fusion   . Prostate surgery     robotic prostatectomy  . Penile prosthesis implant   . Tonsillectomy   . Leg  skin lesion  biopsy / excision     left leg    Family History  Problem Relation Age of Onset  . Heart disease Neg Hx     family hx CHF  . Kidney disease Neg Hx     family hx   . Cancer Neg Hx     family hx prostate    History  Substance Use Topics  . Smoking status: Former Smoker    Quit date: 09/07/1983  . Smokeless tobacco: Never Used  . Alcohol Use: No      Review of Systems  Constitutional: Negative for fever, diaphoresis and activity change.  HENT: Negative for congestion and neck pain.   Respiratory: Negative for cough.   Genitourinary: Negative for dysuria.  Musculoskeletal: Negative for myalgias.  Skin: Negative for color change and wound.  Neurological: Negative for headaches.  All other systems reviewed and are negative.    Allergies  Adhesive  Home Medications   Current Outpatient Rx  Name  Route  Sig  Dispense  Refill  . ALBUTEROL SULFATE HFA 108 (90 BASE) MCG/ACT IN AERS   Inhalation   Inhale 2 puffs into the lungs every 6 (six) hours as needed. For shortness of breath         . AMITRIPTYLINE HCL 25 MG PO TABS   Oral   Take 1 tablet (25 mg total) by mouth at  bedtime.   30 tablet   2   . ARTIFICIAL TEAR OP   Both Eyes   Place 1 drop into both eyes 2 (two) times daily. Preservative free         . ATENOLOL 25 MG PO TABS   Oral   Take 25 mg by mouth 2 (two) times daily.         . ATORVASTATIN CALCIUM 40 MG PO TABS   Oral   Take 40 mg by mouth daily.         . AZATHIOPRINE 50 MG PO TABS   Oral   Take 50 mg by mouth 2 (two) times daily.          . BUPROPION HCL ER (XL) 300 MG PO TB24   Oral   Take 300 mg by mouth every morning.         Marland Kitchen CYANOCOBALAMIN 1000 MCG/ML IJ SOLN   Intramuscular   Inject 1,000 mcg into the muscle every 30 (thirty) days.          . CYCLOSPORINE 0.05 % OP EMUL   Both Eyes   Place 1 drop into both eyes 2 (two) times daily.          Marland Kitchen DIAZEPAM 5 MG PO TABS   Oral   Take 5 mg by mouth 2  (two) times daily as needed. For anxiety         . DICYCLOMINE HCL 10 MG PO CAPS   Oral   Take 10 mg by mouth 4 (four) times daily.         . DULOXETINE HCL 30 MG PO CPEP   Oral   Take 30 mg by mouth daily. Along with 60mg  to=90mg          . DULOXETINE HCL 60 MG PO CPEP   Oral   Take 60 mg by mouth daily. Along with 30mg  to =90mg          . ESOMEPRAZOLE MAGNESIUM 40 MG PO CPDR   Oral   Take 40 mg by mouth every evening.         Marland Kitchen GABAPENTIN 300 MG PO CAPS   Oral   Take 900 mg by mouth 4 (four) times daily.          . GUAIFENESIN ER 600 MG PO TB12   Oral   Take 600 mg by mouth 2 (two) times daily.          . MUSCLE RUB EX   Apply externally   Apply 1 application topically at bedtime as needed. For pain  To back         . OVER THE COUNTER MEDICATION   Oral   Take 1 tablet by mouth at bedtime. Stool Softner         . OXYBUTYNIN CHLORIDE 5 MG PO TABS   Oral   Take 5 mg by mouth at bedtime.         . OXYCODONE-ACETAMINOPHEN 10-325 MG PO TABS   Oral   Take 1 tablet by mouth every 4 (four) hours as needed for pain.   100 tablet   0   . PREDNISONE 5 MG PO TABS   Oral   Take 5 mg by mouth daily.          Marland Kitchen PROMETHAZINE HCL 25 MG PO TABS   Oral   Take 25 mg by mouth every 6 (six) hours as needed. For nausea         .  TRAMADOL HCL 50 MG PO TABS   Oral   Take 50 mg by mouth every 6 (six) hours as needed. For pain         . PREDNISONE 20 MG PO TABS   Oral   Take 1 tablet (20 mg total) by mouth daily. Take 3 tabs day 1, 2 tabs day 2 & 3, then 1 tab day 4-7   11 tablet   0     BP 122/75  Pulse 65  Temp 97.9 F (36.6 C) (Oral)  Resp 16  SpO2 98%  Physical Exam  Nursing note and vitals reviewed. Constitutional: He is oriented to person, place, and time. He appears well-developed and well-nourished. No distress.  HENT:  Head: Normocephalic and atraumatic.  Eyes: Conjunctivae normal and EOM are normal. Pupils are equal, round, and  reactive to light. No scleral icterus.  Neck: Normal range of motion and full passive range of motion without pain. Neck supple. No spinous process tenderness and no muscular tenderness present. No rigidity. Normal range of motion present. No Brudzinski's sign noted.  Cardiovascular: Normal rate, regular rhythm and intact distal pulses.  Exam reveals no gallop and no friction rub.   No murmur heard.      Intact distal pulses, capillary refill less than 3 seconds bilaterally.   Pulmonary/Chest: Effort normal and breath sounds normal. No respiratory distress. He has no wheezes. He has no rales. He exhibits no tenderness.  Musculoskeletal:       Bilateral lower extremities nontender without color change, baseline range of motion of extremities with Pt has increased pain w ROM of lumbar spine. Pain w ambulation. Negative straight leg test bilaterally.   Neurological: He is alert and oriented to person, place, and time. He has normal strength and normal reflexes. No sensory deficit. Abnormal gait: no ataxia, slowed and hunched d/t pain        Sensation at baseline for light touch in all 4 distal extremities, motor symmetric & bilateral 5/5  Skin: Skin is warm and dry. No rash noted. He is not diaphoretic. No erythema. No pallor.       Surgical scar midline over lumbar spine.  No warmth, erythema or purulent drainage.  Tender to palpation over lower portion of scar.  Psychiatric: He has a normal mood and affect.    ED Course  Procedures (including critical care time)  Labs Reviewed - No data to display Dg Lumbar Spine Complete  09/26/2012  *RADIOLOGY REPORT*  Clinical Data: Low back pain.  Twisting injury.  LUMBAR SPINE - COMPLETE 4+ VIEW  Comparison: 09/22/2012  Findings: Of the lateral rod pedicle screw fixation noted at L3-L4 - L5-S1 with a cross bar at the L4-5 level.  Interbody graft and spacers noted. Equivocal lucency along the left L4 pedicle screws on the oblique view not confirmed on the  other projections.  No fracture or static instability.  IMPRESSION:  1.  No fracture or static instability identified. 2.  Equivocal lucency around the left L4 pedicle screw on an oblique view is not confirmed on the lateral projections, and may simply represent fortuitous coaxial imaging of the left pedicle.   Original Report Authenticated By: Gaylyn Rong, M.D.    11:49 AM above results discussed w attending Dr. Bebe Shaggy who is agreeable w dc plan.    1. Status post lumbar surgery   2. Back pain     Consult to neurosurgeon Dr. Lovell Sheehan.  Lumbar imaging ordered to assess hardware in place.  Discharge  patient on a steroid dose pack and advised to followup with neurosurgeon.  BP 122/75  Pulse 65  Temp 97.9 F (36.6 C) (Oral)  Resp 16  SpO2 98%  MDM  Back pain, post surgical  Labs and imaging reviewed. Neurosurgeon consulted as above. Pt dc with steroid rx and follow up with his surgeon. Strict return precautions discussed.         Jaci Carrel, [place] 09/26/12 1150

## 2012-10-20 ENCOUNTER — Other Ambulatory Visit: Payer: Self-pay | Admitting: Internal Medicine

## 2012-10-23 DIAGNOSIS — H01009 Unspecified blepharitis unspecified eye, unspecified eyelid: Secondary | ICD-10-CM | POA: Insufficient documentation

## 2012-10-24 DIAGNOSIS — H538 Other visual disturbances: Secondary | ICD-10-CM | POA: Diagnosis not present

## 2012-10-24 DIAGNOSIS — Z8669 Personal history of other diseases of the nervous system and sense organs: Secondary | ICD-10-CM | POA: Diagnosis not present

## 2012-10-24 DIAGNOSIS — I951 Orthostatic hypotension: Secondary | ICD-10-CM | POA: Insufficient documentation

## 2012-10-24 DIAGNOSIS — N2889 Other specified disorders of kidney and ureter: Secondary | ICD-10-CM | POA: Diagnosis not present

## 2012-10-24 DIAGNOSIS — Z9889 Other specified postprocedural states: Secondary | ICD-10-CM | POA: Diagnosis not present

## 2012-10-24 DIAGNOSIS — C61 Malignant neoplasm of prostate: Secondary | ICD-10-CM | POA: Diagnosis not present

## 2012-10-24 DIAGNOSIS — H0259 Other disorders affecting eyelid function: Secondary | ICD-10-CM | POA: Insufficient documentation

## 2012-10-24 DIAGNOSIS — Z87442 Personal history of urinary calculi: Secondary | ICD-10-CM | POA: Diagnosis not present

## 2012-10-24 DIAGNOSIS — N281 Cyst of kidney, acquired: Secondary | ICD-10-CM | POA: Diagnosis not present

## 2012-10-24 DIAGNOSIS — H524 Presbyopia: Secondary | ICD-10-CM | POA: Diagnosis not present

## 2012-10-24 DIAGNOSIS — H01009 Unspecified blepharitis unspecified eye, unspecified eyelid: Secondary | ICD-10-CM | POA: Diagnosis not present

## 2012-10-24 DIAGNOSIS — H0289 Other specified disorders of eyelid: Secondary | ICD-10-CM | POA: Diagnosis not present

## 2012-10-24 DIAGNOSIS — N289 Disorder of kidney and ureter, unspecified: Secondary | ICD-10-CM | POA: Diagnosis not present

## 2012-11-08 ENCOUNTER — Other Ambulatory Visit: Payer: Self-pay | Admitting: Internal Medicine

## 2012-11-10 ENCOUNTER — Emergency Department (HOSPITAL_COMMUNITY): Payer: Medicare Other

## 2012-11-10 ENCOUNTER — Emergency Department (HOSPITAL_COMMUNITY)
Admission: EM | Admit: 2012-11-10 | Discharge: 2012-11-11 | Disposition: A | Discharge status: Home / Self Care | Payer: Medicare Other | Attending: Emergency Medicine | Admitting: Emergency Medicine

## 2012-11-10 ENCOUNTER — Encounter (HOSPITAL_COMMUNITY): Payer: Self-pay | Admitting: *Deleted

## 2012-11-10 DIAGNOSIS — M1990 Unspecified osteoarthritis, unspecified site: Secondary | ICD-10-CM | POA: Insufficient documentation

## 2012-11-10 DIAGNOSIS — G4730 Sleep apnea, unspecified: Secondary | ICD-10-CM | POA: Insufficient documentation

## 2012-11-10 DIAGNOSIS — F329 Major depressive disorder, single episode, unspecified: Secondary | ICD-10-CM | POA: Insufficient documentation

## 2012-11-10 DIAGNOSIS — Z86718 Personal history of other venous thrombosis and embolism: Secondary | ICD-10-CM | POA: Diagnosis not present

## 2012-11-10 DIAGNOSIS — Z8701 Personal history of pneumonia (recurrent): Secondary | ICD-10-CM | POA: Insufficient documentation

## 2012-11-10 DIAGNOSIS — IMO0002 Reserved for concepts with insufficient information to code with codable children: Secondary | ICD-10-CM | POA: Insufficient documentation

## 2012-11-10 DIAGNOSIS — I428 Other cardiomyopathies: Secondary | ICD-10-CM | POA: Diagnosis not present

## 2012-11-10 DIAGNOSIS — I10 Essential (primary) hypertension: Secondary | ICD-10-CM | POA: Diagnosis not present

## 2012-11-10 DIAGNOSIS — Z8669 Personal history of other diseases of the nervous system and sense organs: Secondary | ICD-10-CM | POA: Insufficient documentation

## 2012-11-10 DIAGNOSIS — Z8709 Personal history of other diseases of the respiratory system: Secondary | ICD-10-CM | POA: Insufficient documentation

## 2012-11-10 DIAGNOSIS — Z79899 Other long term (current) drug therapy: Secondary | ICD-10-CM | POA: Insufficient documentation

## 2012-11-10 DIAGNOSIS — Z9889 Other specified postprocedural states: Secondary | ICD-10-CM | POA: Insufficient documentation

## 2012-11-10 DIAGNOSIS — K219 Gastro-esophageal reflux disease without esophagitis: Secondary | ICD-10-CM | POA: Insufficient documentation

## 2012-11-10 DIAGNOSIS — E785 Hyperlipidemia, unspecified: Secondary | ICD-10-CM | POA: Diagnosis not present

## 2012-11-10 DIAGNOSIS — Z8639 Personal history of other endocrine, nutritional and metabolic disease: Secondary | ICD-10-CM | POA: Insufficient documentation

## 2012-11-10 DIAGNOSIS — R079 Chest pain, unspecified: Secondary | ICD-10-CM | POA: Insufficient documentation

## 2012-11-10 DIAGNOSIS — Z87891 Personal history of nicotine dependence: Secondary | ICD-10-CM | POA: Diagnosis not present

## 2012-11-10 DIAGNOSIS — I499 Cardiac arrhythmia, unspecified: Secondary | ICD-10-CM | POA: Diagnosis not present

## 2012-11-10 DIAGNOSIS — Z862 Personal history of diseases of the blood and blood-forming organs and certain disorders involving the immune mechanism: Secondary | ICD-10-CM | POA: Insufficient documentation

## 2012-11-10 DIAGNOSIS — E7800 Pure hypercholesterolemia, unspecified: Secondary | ICD-10-CM | POA: Insufficient documentation

## 2012-11-10 DIAGNOSIS — Z8679 Personal history of other diseases of the circulatory system: Secondary | ICD-10-CM | POA: Insufficient documentation

## 2012-11-10 DIAGNOSIS — G894 Chronic pain syndrome: Secondary | ICD-10-CM | POA: Insufficient documentation

## 2012-11-10 DIAGNOSIS — Z87442 Personal history of urinary calculi: Secondary | ICD-10-CM | POA: Diagnosis not present

## 2012-11-10 DIAGNOSIS — Z8546 Personal history of malignant neoplasm of prostate: Secondary | ICD-10-CM | POA: Diagnosis not present

## 2012-11-10 DIAGNOSIS — F3289 Other specified depressive episodes: Secondary | ICD-10-CM | POA: Insufficient documentation

## 2012-11-10 LAB — COMPREHENSIVE METABOLIC PANEL
ALT: 15 U/L (ref 0–53)
AST: 20 U/L (ref 0–37)
Albumin: 4.1 g/dL (ref 3.5–5.2)
Alkaline Phosphatase: 46 U/L (ref 39–117)
BUN: 11 mg/dL (ref 6–23)
CO2: 26 mEq/L (ref 19–32)
Calcium: 9.6 mg/dL (ref 8.4–10.5)
Chloride: 102 mEq/L (ref 96–112)
Creatinine, Ser: 0.95 mg/dL (ref 0.50–1.35)
GFR calc Af Amer: >90 mL/min (ref >90)
GFR calc non Af Amer: >90 mL/min (ref >90)
Glucose, Bld: 89 mg/dL (ref 70–99)
Potassium: 4 mEq/L (ref 3.5–5.1)
Sodium: 138 mEq/L (ref 135–145)
Total Bilirubin: 0.3 mg/dL (ref 0.3–1.2)
Total Protein: 7.3 g/dL (ref 6.0–8.3)

## 2012-11-10 LAB — CBC WITH DIFFERENTIAL/PLATELET
Basophils Absolute: 0 10*3/uL (ref 0.0–0.1)
Basophils Relative: 1 % (ref 0–1)
Eosinophils Absolute: 0.1 10*3/uL (ref 0.0–0.7)
Eosinophils Relative: 4 % (ref 0–5)
HCT: 39 % (ref 39.0–52.0)
Hemoglobin: 14.1 g/dL (ref 13.0–17.0)
Lymphocytes Relative: 35 % (ref 12–46)
Lymphs Abs: 1.1 10*3/uL (ref 0.7–4.0)
MCH: 29.5 pg (ref 26.0–34.0)
MCHC: 36.2 g/dL — ABNORMAL HIGH (ref 30.0–36.0)
MCV: 81.6 fL (ref 78.0–100.0)
Monocytes Absolute: 0.6 10*3/uL (ref 0.1–1.0)
Monocytes Relative: 17 % — ABNORMAL HIGH (ref 3–12)
Neutro Abs: 1.4 10*3/uL — ABNORMAL LOW (ref 1.7–7.7)
Neutrophils Relative %: 43 % (ref 43–77)
Platelets: 319 10*3/uL (ref 150–400)
RBC: 4.78 MIL/uL (ref 4.22–5.81)
RDW: 13.9 % (ref 11.5–15.5)
WBC: 3.3 10*3/uL — ABNORMAL LOW (ref 4.0–10.5)

## 2012-11-10 LAB — TROPONIN I: Troponin I: <0.3 ng/mL (ref <0.30)

## 2012-11-10 MED ORDER — ASPIRIN 325 MG PO TABS
325.0000 mg | ORAL_TABLET | Freq: Once | ORAL | Status: AC
  Administered 2012-11-10: 325 mg via ORAL
  Filled 2012-11-10: qty 1

## 2012-11-10 MED ORDER — NITROGLYCERIN 0.4 MG SL SUBL: 0.4000 mg | SUBLINGUAL_TABLET | SUBLINGUAL | Status: DC | PRN

## 2012-11-10 NOTE — ED Notes (Signed)
The pt is c/o some mid-chest pain with lt arm radiation and lt neck pain for 3-4 days with dizziness .  Neck surgery dec 2013

## 2012-11-10 NOTE — ED Notes (Signed)
EDP at the bedside.  ?

## 2012-11-10 NOTE — ED Notes (Signed)
Patient transported to X-ray 

## 2012-11-11 LAB — TROPONIN I: Troponin I: <0.3 ng/mL (ref <0.30)

## 2012-11-11 NOTE — ED Provider Notes (Signed)
History     CSN: [id number]  Arrival date & time 11/10/12  2029   First MD Initiated Contact with Patient 11/10/12 2059      Chief Complaint  Patient presents with  . Chest Pain   Patient is a 47 y.o. male presenting with chest pain.  Chest Pain Associated symptoms: no abdominal pain, no cough, no fever, no headache, no nausea and not vomiting    NILTON LAVE is a 47 y.o. male who presents to the emergency department for concern of chest pain.  Reports that this started 2 days ago and is slowly getting worse.  Sharp.  L sided.  No pressure.  Some involvement of L shoulder.  Moderate in severity.  Never had this before.  Nonexertional.  Reports "It feels better when I'm moving."  No cough.  No SOB.  No other symptoms.  Past Medical History  Diagnosis Date  . B12 DEFICIENCY 03/06/2007  . CARDIOMYOPATHY, IDIOPATHIC HYPERTROPHIC 03/06/2007  . DEPRESSION 03/06/2007  . GERD 06/01/2007  . HYPERCHOLESTEROLEMIA 03/06/2007  . HYPERLIPIDEMIA 11/08/2008  . IMPAIRED GLUCOSE TOLERANCE 11/08/2008  . NEPHROLITHIASIS, HX OF 04/07/2010  . 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 OSTEOARTHRITIS 06/01/2007  . Palpitations 10/23/2007  . SYNDROME, CHRONIC PAIN 03/06/2007  . Shortness of breath   . Cancer     prostate ca  . Prostate cancer   . HYPERTENSION 03/06/2007    Dr. Amador Cunas  . Pneumonia     hx of  . SLEEP APNEA 03/06/2007    uses vpap, last sleep study 2011, Dr. Rhona Raider, at [facility]  . Kidney stones     hx of , had surgery for removal  . Dysrhythmia     sees Dr. Jens Som for "palpitations"  . DVT (deep venous thrombosis)     Past Surgical History  Procedure Laterality Date  . Decompression and fusion      cervicothoracic  . Partial nephrectomy    . Cardiac catheterization    . Lumbar fusion    . Prostate surgery      robotic prostatectomy  . Penile prosthesis implant    . Tonsillectomy    . Leg skin lesion  biopsy / excision      left leg    Family History  Problem Relation Age of  Onset  . Heart disease Neg Hx     family hx CHF  . Kidney disease Neg Hx     family hx   . Cancer Neg Hx     family hx prostate    History  Substance Use Topics  . Smoking status: Former Smoker    Quit date: 09/07/1983  . Smokeless tobacco: Never Used  . Alcohol Use: No      Review of Systems  Constitutional: Negative for fever and chills.  HENT: Negative for congestion, sore throat and neck pain.   Respiratory: Negative for cough.   Cardiovascular: Positive for chest pain.  Gastrointestinal: Negative for nausea, vomiting, abdominal pain, diarrhea and constipation.  Endocrine: Negative for polyuria.  Genitourinary: Negative for dysuria and hematuria.  Skin: Negative for rash.  Neurological: Negative for headaches.  Psychiatric/Behavioral: Negative.   All other systems reviewed and are negative.    Allergies  Adhesive  Home Medications   Current Outpatient Rx  Name  Route  Sig  Dispense  Refill  . albuterol (PROVENTIL HFA;VENTOLIN HFA) 108 (90 BASE) MCG/ACT inhaler   Inhalation   Inhale 2 puffs into the lungs every 6 (six) hours as needed for wheezing  or shortness of breath.          Marland Kitchen amitriptyline (ELAVIL) 25 MG tablet   Oral   Take 1 tablet (25 mg total) by mouth at bedtime.   30 tablet   2   . ARTIFICIAL TEAR OP   Both Eyes   Place 1 drop into both eyes 2 (two) times daily. Preservative free         . atenolol (TENORMIN) 25 MG tablet   Oral   Take 25 mg by mouth 2 (two) times daily.         Marland Kitchen atorvastatin (LIPITOR) 40 MG tablet   Oral   Take 40 mg by mouth daily.         Marland Kitchen azaTHIOprine (IMURAN) 50 MG tablet   Oral   Take 50 mg by mouth 2 (two) times daily.          Marland Kitchen buPROPion (WELLBUTRIN XL) 300 MG 24 hr tablet   Oral   Take 300 mg by mouth every morning.         . cyanocobalamin (COBAL-1000) 1000 MCG/ML injection   Intramuscular   Inject 1,000 mcg into the muscle every 30 (thirty) days.          . cycloSPORINE (RESTASIS)  0.05 % ophthalmic emulsion   Both Eyes   Place 1 drop into both eyes 2 (two) times daily.          . diazepam (VALIUM) 5 MG tablet   Oral   Take 5 mg by mouth 2 (two) times daily as needed. For anxiety         . dicyclomine (BENTYL) 10 MG capsule   Oral   Take 10 mg by mouth 4 (four) times daily.         Marland Kitchen docusate sodium (COLACE) 100 MG capsule   Oral   Take 100 mg by mouth 2 (two) times daily.         . DULoxetine (CYMBALTA) 30 MG capsule   Oral   Take 30 mg by mouth daily. Along with 60mg  to=90mg          . DULoxetine (CYMBALTA) 60 MG capsule   Oral   Take 60 mg by mouth daily. Along with 30mg  to =90mg          . esomeprazole (NEXIUM) 40 MG capsule   Oral   Take 40 mg by mouth every evening.         . gabapentin (NEURONTIN) 300 MG capsule   Oral   Take 900 mg by mouth 4 (four) times daily.          Marland Kitchen guaiFENesin (MUCINEX) 600 MG 12 hr tablet   Oral   Take 600 mg by mouth 2 (two) times daily.          . Menthol-Methyl Salicylate (MUSCLE RUB EX)   Apply externally   Apply 1 application topically at bedtime as needed. For pain  To back         . OVER THE COUNTER MEDICATION   Both Eyes   Place 1 application into both eyes at bedtime. "Eye Gel"         . oxybutynin (DITROPAN) 5 MG tablet   Oral   Take 5 mg by mouth at bedtime.         Marland Kitchen oxyCODONE-acetaminophen (PERCOCET) 10-325 MG per tablet   Oral   Take 1 tablet by mouth every 4 (four) hours as needed for pain.   100 tablet  0   . predniSONE (DELTASONE) 5 MG tablet   Oral   Take 5 mg by mouth daily.          . promethazine (PHENERGAN) 25 MG tablet   Oral   Take 25 mg by mouth every 6 (six) hours as needed for nausea.          . traMADol (ULTRAM) 50 MG tablet   Oral   Take 50 mg by mouth every 6 (six) hours as needed for pain.            BP 107/74  Pulse 68  Temp(Src) 98 F (36.7 C) (Oral)  Resp 19  SpO2 96%  Physical Exam  Nursing note and vitals  reviewed. Constitutional: He is oriented to person, place, and time. He appears well-developed and well-nourished. No distress.  HENT:  Head: Normocephalic and atraumatic.  Right Ear: External ear normal.  Left Ear: External ear normal.  Mouth/Throat: Oropharynx is clear and moist. No oropharyngeal exudate.  Eyes: Conjunctivae are normal. Pupils are equal, round, and reactive to light. Right eye exhibits no discharge.  Neck: Normal range of motion. Neck supple. No tracheal deviation present.  Cardiovascular: Normal rate, regular rhythm and intact distal pulses.   Pulmonary/Chest: Effort normal. No respiratory distress. He has no wheezes. He has no rales.  Abdominal: Soft. He exhibits no distension. There is no tenderness. There is no rebound and no guarding.  Musculoskeletal: Normal range of motion.  Neurological: He is alert and oriented to person, place, and time.  Skin: Skin is warm and dry. No rash noted. He is not diaphoretic.  Psychiatric: He has a normal mood and affect.    ED Course  Procedures (including critical care time)  Labs Reviewed  CBC WITH DIFFERENTIAL - Abnormal; Notable for the following:    WBC 3.3 (*)    MCHC 36.2 (*)    Neutro Abs 1.4 (*)    Monocytes Relative 17 (*)    All other components within normal limits  COMPREHENSIVE METABOLIC PANEL  TROPONIN I  TROPONIN I   Dg Chest 2 View  11/10/2012  *RADIOLOGY REPORT*  Clinical Data: Chest pain; dizziness.  CHEST - 2 VIEW  Comparison: Chest radiograph and CT of the chest performed 09/09/2012  Findings: The lungs are well-aerated.  Mild interstitial prominence at the lung bases is grossly unchanged and chronic in nature. There is no evidence of pleural effusion or pneumothorax.  The heart is normal in size; the mediastinal contour is within normal limits.  No acute osseous abnormalities are seen.  IMPRESSION: No acute cardiopulmonary process seen.   Original Report Authenticated By: Tonia Ghent, M.D.      Date:  11/11/2012  Rate: 83  Rhythm: normal sinus rhythm  QRS Axis: normal  Intervals: normal  ST/T Wave abnormalities: normal  Conduction Disutrbances:none  Narrative Interpretation: Normal sinus rhythm. Normal EKG. Unchanged from previous.  Old EKG Reviewed: none available    1. Chest pain       MDM   47 year old male with history of neurosarcoidosis who presents to the emergency department with concern of chest pain. Normal exam. Very atypical story for ACS. Doubt PE. Pain described more as pleuritic. Likely secondary to sarcoidosis and recent cold air. Delta trope ordered and negative. Doubt dissection. Patient reexamined and feeling much better. Patient safe for discharge. Patient discharge. Recommend close follow up PCP and returning if chest pain returns or patient develops any symptoms. Patient discharged.      Wiley Thuet,  MD 11/11/12 1610

## 2012-11-11 NOTE — ED Provider Notes (Signed)
I saw and evaluated the patient, reviewed the resident's note and I agree with the findings and plan. I have reviewed EKG and agree with the resident interpretation.   Patient presenting with left neck and arm pain and then developed left chest pain today without shortness of breath. Patient denies any diaphoresis nausea or vomiting. Patient has a long history of sarcoidosis. No prior history of PE but does have a history of DVT in the past. Last surgery was in December. Patient was seen at the end of January and had a negative CT PE study. Patient's story today is atypical for cardiac urology and low suspicion for PE given her recent normal CT PET scan, no calf tenderness and no recent surgeries in the last 2 months. EKG within normal limits and chest x-ray without acute findings. Delta troponin's were negative and patient was discharged home to follow up with PCP  Gwyneth Sprout, MD 11/11/12 [phone]

## 2012-11-22 ENCOUNTER — Other Ambulatory Visit: Payer: Self-pay | Admitting: Internal Medicine

## 2012-11-26 ENCOUNTER — Other Ambulatory Visit: Payer: Self-pay | Admitting: Internal Medicine

## 2012-11-27 ENCOUNTER — Other Ambulatory Visit: Payer: Self-pay | Admitting: Internal Medicine

## 2012-12-01 ENCOUNTER — Encounter (HOSPITAL_COMMUNITY): Payer: Self-pay | Admitting: Emergency Medicine

## 2012-12-01 ENCOUNTER — Emergency Department (HOSPITAL_COMMUNITY): Payer: Medicare Other

## 2012-12-01 ENCOUNTER — Inpatient Hospital Stay (HOSPITAL_COMMUNITY)
Admission: EM | Admit: 2012-12-01 | Discharge: 2012-12-05 | DRG: 303 | Disposition: A | Discharge status: Home / Self Care | Payer: Medicare Other | Attending: Internal Medicine | Admitting: Internal Medicine

## 2012-12-01 DIAGNOSIS — K219 Gastro-esophageal reflux disease without esophagitis: Secondary | ICD-10-CM | POA: Diagnosis present

## 2012-12-01 DIAGNOSIS — Z87891 Personal history of nicotine dependence: Secondary | ICD-10-CM | POA: Diagnosis not present

## 2012-12-01 DIAGNOSIS — I2510 Atherosclerotic heart disease of native coronary artery without angina pectoris: Secondary | ICD-10-CM | POA: Diagnosis present

## 2012-12-01 DIAGNOSIS — D869 Sarcoidosis, unspecified: Secondary | ICD-10-CM | POA: Diagnosis present

## 2012-12-01 DIAGNOSIS — I10 Essential (primary) hypertension: Secondary | ICD-10-CM | POA: Diagnosis present

## 2012-12-01 DIAGNOSIS — R52 Pain, unspecified: Secondary | ICD-10-CM

## 2012-12-01 DIAGNOSIS — I498 Other specified cardiac arrhythmias: Secondary | ICD-10-CM | POA: Diagnosis present

## 2012-12-01 DIAGNOSIS — E669 Obesity, unspecified: Secondary | ICD-10-CM | POA: Diagnosis present

## 2012-12-01 DIAGNOSIS — I421 Obstructive hypertrophic cardiomyopathy: Secondary | ICD-10-CM | POA: Diagnosis present

## 2012-12-01 DIAGNOSIS — G894 Chronic pain syndrome: Secondary | ICD-10-CM | POA: Diagnosis not present

## 2012-12-01 DIAGNOSIS — G4730 Sleep apnea, unspecified: Secondary | ICD-10-CM | POA: Diagnosis present

## 2012-12-01 DIAGNOSIS — F329 Major depressive disorder, single episode, unspecified: Secondary | ICD-10-CM | POA: Diagnosis present

## 2012-12-01 DIAGNOSIS — Z981 Arthrodesis status: Secondary | ICD-10-CM | POA: Diagnosis not present

## 2012-12-01 DIAGNOSIS — I517 Cardiomegaly: Secondary | ICD-10-CM | POA: Diagnosis not present

## 2012-12-01 DIAGNOSIS — Z86718 Personal history of other venous thrombosis and embolism: Secondary | ICD-10-CM

## 2012-12-01 DIAGNOSIS — R079 Chest pain, unspecified: Secondary | ICD-10-CM

## 2012-12-01 DIAGNOSIS — Z87442 Personal history of urinary calculi: Secondary | ICD-10-CM | POA: Diagnosis not present

## 2012-12-01 DIAGNOSIS — E538 Deficiency of other specified B group vitamins: Secondary | ICD-10-CM | POA: Diagnosis present

## 2012-12-01 DIAGNOSIS — M5030 Other cervical disc degeneration, unspecified cervical region: Secondary | ICD-10-CM | POA: Diagnosis not present

## 2012-12-01 DIAGNOSIS — E7800 Pure hypercholesterolemia, unspecified: Secondary | ICD-10-CM | POA: Diagnosis present

## 2012-12-01 DIAGNOSIS — M1990 Unspecified osteoarthritis, unspecified site: Secondary | ICD-10-CM

## 2012-12-01 DIAGNOSIS — E785 Hyperlipidemia, unspecified: Secondary | ICD-10-CM | POA: Diagnosis present

## 2012-12-01 DIAGNOSIS — R7309 Other abnormal glucose: Secondary | ICD-10-CM | POA: Diagnosis present

## 2012-12-01 DIAGNOSIS — Z905 Acquired absence of kidney: Secondary | ICD-10-CM | POA: Diagnosis not present

## 2012-12-01 DIAGNOSIS — R972 Elevated prostate specific antigen [PSA]: Secondary | ICD-10-CM

## 2012-12-01 DIAGNOSIS — J111 Influenza due to unidentified influenza virus with other respiratory manifestations: Secondary | ICD-10-CM

## 2012-12-01 DIAGNOSIS — E739 Lactose intolerance, unspecified: Secondary | ICD-10-CM | POA: Diagnosis present

## 2012-12-01 DIAGNOSIS — I428 Other cardiomyopathies: Secondary | ICD-10-CM | POA: Diagnosis not present

## 2012-12-01 DIAGNOSIS — R002 Palpitations: Secondary | ICD-10-CM

## 2012-12-01 DIAGNOSIS — Z6834 Body mass index (BMI) 34.0-34.9, adult: Secondary | ICD-10-CM

## 2012-12-01 DIAGNOSIS — F411 Generalized anxiety disorder: Secondary | ICD-10-CM | POA: Diagnosis present

## 2012-12-01 DIAGNOSIS — I200 Unstable angina: Secondary | ICD-10-CM | POA: Diagnosis present

## 2012-12-01 DIAGNOSIS — R509 Fever, unspecified: Secondary | ICD-10-CM

## 2012-12-01 DIAGNOSIS — F339 Major depressive disorder, recurrent, unspecified: Secondary | ICD-10-CM | POA: Diagnosis present

## 2012-12-01 DIAGNOSIS — Z8546 Personal history of malignant neoplasm of prostate: Secondary | ICD-10-CM | POA: Diagnosis not present

## 2012-12-01 DIAGNOSIS — F3289 Other specified depressive episodes: Secondary | ICD-10-CM

## 2012-12-01 DIAGNOSIS — L720 Epidermal cyst: Secondary | ICD-10-CM

## 2012-12-01 DIAGNOSIS — J449 Chronic obstructive pulmonary disease, unspecified: Secondary | ICD-10-CM | POA: Diagnosis not present

## 2012-12-01 HISTORY — DX: Anxiety disorder, unspecified: F41.9

## 2012-12-01 HISTORY — DX: Angina pectoris, unspecified: I20.9

## 2012-12-01 LAB — CBC
HCT: 43.6 % (ref 39.0–52.0)
Hemoglobin: 14.9 g/dL (ref 13.0–17.0)
MCH: 29.3 pg (ref 26.0–34.0)
MCHC: 34.2 g/dL (ref 30.0–36.0)
MCV: 85.8 fL (ref 78.0–100.0)
Platelets: 313 10*3/uL (ref 150–400)
RBC: 5.08 MIL/uL (ref 4.22–5.81)
RDW: 14.2 % (ref 11.5–15.5)
WBC: 3.9 10*3/uL — ABNORMAL LOW (ref 4.0–10.5)

## 2012-12-01 LAB — BASIC METABOLIC PANEL
BUN: 13 mg/dL (ref 6–23)
CO2: 27 mEq/L (ref 19–32)
Calcium: 9.5 mg/dL (ref 8.4–10.5)
Chloride: 100 mEq/L (ref 96–112)
Creatinine, Ser: 0.99 mg/dL (ref 0.50–1.35)
GFR calc Af Amer: >90 mL/min (ref >90)
GFR calc non Af Amer: >90 mL/min (ref >90)
Glucose, Bld: 96 mg/dL (ref 70–99)
Potassium: 4.5 mEq/L (ref 3.5–5.1)
Sodium: 137 mEq/L (ref 135–145)

## 2012-12-01 LAB — POCT I-STAT TROPONIN I: Troponin i, poc: 0 ng/mL (ref 0.00–0.08)

## 2012-12-01 LAB — D-DIMER, QUANTITATIVE: D-Dimer, Quant: <0.27 ug/mL-FEU (ref 0.00–0.48)

## 2012-12-01 MED ORDER — KETOROLAC TROMETHAMINE 30 MG/ML IJ SOLN
30.0000 mg | Freq: Once | INTRAMUSCULAR | Status: AC
  Administered 2012-12-01: 30 mg via INTRAVENOUS
  Filled 2012-12-01: qty 1

## 2012-12-01 MED ORDER — MORPHINE SULFATE 4 MG/ML IJ SOLN
4.0000 mg | Freq: Once | INTRAMUSCULAR | Status: AC
  Administered 2012-12-01: 4 mg via INTRAVENOUS
  Filled 2012-12-01: qty 1

## 2012-12-01 NOTE — ED Notes (Addendum)
Pt states he is still having some pain 8/10. Med given per md order.

## 2012-12-01 NOTE — ED Notes (Signed)
Pt st's he has been having left chest pain radiating into left side of neck and left arm for 1 week.  Pt has cardiac hx but st's he thought the pain would subside.  St's at times he has had shortness of breath with pain but denies nausea, vomiting or diaphoresis.

## 2012-12-01 NOTE — ED Provider Notes (Signed)
History     CSN: [id number]  Arrival date & time 12/01/12  1652   First MD Initiated Contact with Patient 12/01/12 1918      Chief Complaint  Patient presents with  . Chest Pain    (Consider location/radiation/quality/duration/timing/severity/associated sxs/prior treatment) Patient is a 47 y.o. male presenting with chest pain. The history is provided by the patient.  Chest Pain He has been having chest pains for the last week with associated dyspnea. He denies nausea, vomiting, diaphoresis. Pain is moderate to severe and he rates it at 7/10. Pain is described as sharp and sometimes a twisting feeling in his heart. Nothing makes it better nothing makes it worse. It is not affected by taking a deep breath, movement, exertion. Pain had been generally lasting for about 5 minutes but has been constant through the day today. 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 He has a history of hypertrophic cardiomyopathy but no other cardiac problems. He states his last stress test was about 3 years ago.  Past Medical History  Diagnosis Date  . B12 DEFICIENCY 03/06/2007  . CARDIOMYOPATHY, IDIOPATHIC HYPERTROPHIC 03/06/2007  . DEPRESSION 03/06/2007  . GERD 06/01/2007  . HYPERCHOLESTEROLEMIA 03/06/2007  . HYPERLIPIDEMIA 11/08/2008  . IMPAIRED GLUCOSE TOLERANCE 11/08/2008  . NEPHROLITHIASIS, HX OF 04/07/2010  . 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 OSTEOARTHRITIS 06/01/2007  . Palpitations 10/23/2007  . SYNDROME, CHRONIC PAIN 03/06/2007  . Shortness of breath   . Cancer     prostate ca  . Prostate cancer   . HYPERTENSION 03/06/2007    Dr. Amador Cunas  . Pneumonia     hx of  . SLEEP APNEA 03/06/2007    uses vpap, last sleep study 2011, Dr. Rhona Raider, at [facility]  . Kidney stones     hx of , had surgery for removal  . Dysrhythmia     sees Dr. Jens Som for "palpitations"  . DVT  (deep venous thrombosis)     Past Surgical History  Procedure Laterality Date  . Decompression and fusion      cervicothoracic  . Partial nephrectomy    . Cardiac catheterization    . Lumbar fusion    . Prostate surgery      robotic prostatectomy  . Penile prosthesis implant    . Tonsillectomy    . Leg skin lesion  biopsy / excision      left leg    Family History  Problem Relation Age of Onset  . Heart disease Neg Hx     family hx CHF  . Kidney disease Neg Hx     family hx   . Cancer Neg Hx     family hx prostate    History  Substance Use Topics  . Smoking status: Former Smoker    Quit date: 09/07/1983  . Smokeless tobacco: Never Used  . Alcohol Use: No      Review of Systems  Cardiovascular: Positive for chest pain.  All other systems reviewed and are negative.    Allergies  Adhesive  Home Medications   Current Outpatient Rx  Name  Route  Sig  Dispense  Refill  . albuterol (PROVENTIL HFA;VENTOLIN HFA) 108 (90 BASE) MCG/ACT inhaler   Inhalation   Inhale 2 puffs into the lungs every 6 (six) hours as needed for wheezing or shortness of breath.          Marland Kitchen  amitriptyline (ELAVIL) 25 MG tablet   Oral   Take 1 tablet (25 mg total) by mouth at bedtime.   30 tablet   2   . ARTIFICIAL TEAR OP   Both Eyes   Place 1 drop into both eyes 2 (two) times daily. Preservative free         . atenolol (TENORMIN) 25 MG tablet   Oral   Take 25 mg by mouth 2 (two) times daily.         Marland Kitchen atorvastatin (LIPITOR) 40 MG tablet   Oral   Take 40 mg by mouth daily.         Marland Kitchen azaTHIOprine (IMURAN) 50 MG tablet   Oral   Take 50 mg by mouth 2 (two) times daily.          Marland Kitchen buPROPion (WELLBUTRIN XL) 300 MG 24 hr tablet   Oral   Take 300 mg by mouth every morning.         . cyanocobalamin (COBAL-1000) 1000 MCG/ML injection   Intramuscular   Inject 1,000 mcg into the muscle every 30 (thirty) days.          . cycloSPORINE (RESTASIS) 0.05 % ophthalmic  emulsion   Both Eyes   Place 1 drop into both eyes 2 (two) times daily.          . diazepam (VALIUM) 5 MG tablet   Oral   Take 5 mg by mouth 2 (two) times daily as needed. For anxiety         . dicyclomine (BENTYL) 10 MG capsule   Oral   Take 10 mg by mouth 4 (four) times daily.         Marland Kitchen docusate sodium (COLACE) 100 MG capsule   Oral   Take 100 mg by mouth 2 (two) times daily.         . DULoxetine (CYMBALTA) 30 MG capsule   Oral   Take 30 mg by mouth daily. Along with 60mg  to=90mg          . DULoxetine (CYMBALTA) 60 MG capsule   Oral   Take 60 mg by mouth daily. Along with 30mg  to =90mg          . esomeprazole (NEXIUM) 40 MG capsule   Oral   Take 40 mg by mouth every evening.         . gabapentin (NEURONTIN) 300 MG capsule   Oral   Take 900 mg by mouth 4 (four) times daily.          Marland Kitchen guaiFENesin (MUCINEX) 600 MG 12 hr tablet   Oral   Take 600 mg by mouth 2 (two) times daily.          . Menthol-Methyl Salicylate (MUSCLE RUB EX)   Apply externally   Apply 1 application topically at bedtime as needed. For pain  To back         . OVER THE COUNTER MEDICATION   Both Eyes   Place 1 application into both eyes at bedtime. "Eye Gel"         . oxybutynin (DITROPAN) 5 MG tablet   Oral   Take 5 mg by mouth at bedtime.         Marland Kitchen oxyCODONE-acetaminophen (PERCOCET) 10-325 MG per tablet   Oral   Take 1 tablet by mouth every 4 (four) hours as needed for pain.   100 tablet   0   . predniSONE (DELTASONE) 5 MG tablet   Oral  Take 5 mg by mouth daily.          . promethazine (PHENERGAN) 25 MG tablet   Oral   Take 25 mg by mouth every 6 (six) hours as needed for nausea.          . traMADol (ULTRAM) 50 MG tablet   Oral   Take 50 mg by mouth every 6 (six) hours as needed for pain.            BP 111/71  Pulse 72  Temp(Src) 98.4 F (36.9 C) (Oral)  Resp 18  SpO2 98%  Physical Exam  Nursing note and vitals reviewed.  47 year old male,  resting comfortably and in no acute distress. Vital signs are normal. Oxygen saturation is 98%, which is normal. Head is normocephalic and atraumatic. PERRLA, EOMI. Oropharynx is clear. Neck is nontender and supple without adenopathy or JVD. Back is nontender and there is no CVA tenderness. Lungs are clear without rales, wheezes, or rhonchi. Chest is nontender. Heart has regular rate and rhythm without murmur. Abdomen is soft, flat, nontender without masses or hepatosplenomegaly and peristalsis is normoactive. Extremities have no cyanosis or edema, full range of motion is present. Skin is warm and dry without rash. Neurologic: Mental status is normal, cranial nerves are intact, there are no motor or sensory deficits. \ ED Course  Procedures (including critical care time)  Results for orders placed during the hospital encounter of 12/01/12  CBC      Result Value Range   WBC 3.9 (*) 4.0 - 10.5 K/uL   RBC 5.08  4.22 - 5.81 MIL/uL   Hemoglobin 14.9  13.0 - 17.0 g/dL   HCT 16.1  09.6 - 04.5 %   MCV 85.8  78.0 - 100.0 fL   MCH 29.3  26.0 - 34.0 pg   MCHC 34.2  30.0 - 36.0 g/dL   RDW 40.9  81.1 - 91.4 %   Platelets 313  150 - 400 K/uL  BASIC METABOLIC PANEL      Result Value Range   Sodium 137  135 - 145 mEq/L   Potassium 4.5  3.5 - 5.1 mEq/L   Chloride 100  96 - 112 mEq/L   CO2 27  19 - 32 mEq/L   Glucose, Bld 96  70 - 99 mg/dL   BUN 13  6 - 23 mg/dL   Creatinine, Ser 7.82  0.50 - 1.35 mg/dL   Calcium 9.5  8.4 - 95.6 mg/dL   GFR calc non Af Amer >90  >90 mL/min   GFR calc Af Amer >90  >90 mL/min  D-DIMER, QUANTITATIVE      Result Value Range   D-Dimer, Quant <0.27  0.00 - 0.48 ug/mL-FEU  POCT I-STAT TROPONIN I      Result Value Range   Troponin i, poc 0.00  0.00 - 0.08 ng/mL   Comment 3            Dg Chest 2 View  12/01/2012  *RADIOLOGY REPORT*  Clinical Data: Chest pain.  History of hypertension.  CHEST - 2 VIEW  Comparison: 11/10/2012 and chest CTA dated 09/09/2012.   Findings: Normal sized heart.  Stable mild prominence of the right hilum, with associated adenopathy on the previous CT.  The lungs remain clear with stable mild prominence of the interstitial markings.  There is some diaphragmatic flattening on the lateral view.  Minimal thoracic spine degenerative changes.  IMPRESSION:  1.  No acute abnormality. 2.  Stable mild  right hilar adenopathy. 3.  Stable mild changes of COPD.   Original Report Authenticated By: Beckie Salts, M.D.     ECG shows normal sinus rhythm with a rate of 81, no ectopy. Normal axis. Normal P wave. Normal QRS. Normal intervals. Normal ST and T waves. Impression: normal ECG. When compared with ECG of 11/10/2012, no significant changes are seen.   1. Chest pain       MDM  Atypical chest pain. I reviewed his past records and he had cardiology consultation during an admission for chest pain and was noted to have had a normal cardiac catheterization in 1998 and has no history of actual coronary artery disease. Initial troponin is negative and WBC is low which might indicate a viral illness. Chest x-ray will be obtained and he'll be given a therapeutic trial of ketorolac.  He got no relief from ketorolac. He is given morphine for pain. I still feel he is a low risk of coronary artery disease but he will be admitted for serial troponins. Case is discussed with Dr. Eben Burow of [facility] who agrees to admit the patient under observation status.  Dione Booze, MD 12/02/12 [phone]

## 2012-12-02 ENCOUNTER — Encounter (HOSPITAL_COMMUNITY): Payer: Self-pay | Admitting: *Deleted

## 2012-12-02 DIAGNOSIS — R079 Chest pain, unspecified: Secondary | ICD-10-CM

## 2012-12-02 LAB — MRSA PCR SCREENING: MRSA by PCR: NEGATIVE

## 2012-12-02 LAB — TSH: TSH: 2.738 u[IU]/mL (ref 0.350–4.500)

## 2012-12-02 LAB — TROPONIN I
Troponin I: <0.3 ng/mL (ref <0.30)
Troponin I: <0.3 ng/mL (ref <0.30)

## 2012-12-02 LAB — HEPARIN LEVEL (UNFRACTIONATED)
Heparin Unfractionated: 0.6 IU/mL (ref 0.30–0.70)
Heparin Unfractionated: 0.59 IU/mL (ref 0.30–0.70)

## 2012-12-02 LAB — HEMOGLOBIN A1C
Hgb A1c MFr Bld: 5.7 % — ABNORMAL HIGH (ref <5.7)
Mean Plasma Glucose: 117 mg/dL — ABNORMAL HIGH (ref <117)

## 2012-12-02 LAB — POCT I-STAT TROPONIN I: Troponin i, poc: 0 ng/mL (ref 0.00–0.08)

## 2012-12-02 MED ORDER — DICYCLOMINE HCL 10 MG PO CAPS
10.0000 mg | ORAL_CAPSULE | Freq: Three times a day (TID) | ORAL | Status: DC
  Administered 2012-12-02 – 2012-12-05 (×12): 10 mg via ORAL
  Filled 2012-12-02 (×14): qty 1

## 2012-12-02 MED ORDER — PREDNISONE 5 MG PO TABS
5.0000 mg | ORAL_TABLET | Freq: Every day | ORAL | Status: DC
  Administered 2012-12-02 – 2012-12-05 (×4): 5 mg via ORAL
  Filled 2012-12-02 (×4): qty 1

## 2012-12-02 MED ORDER — HEPARIN (PORCINE) IN NACL 100-0.45 UNIT/ML-% IJ SOLN
1400.0000 [IU]/h | INTRAMUSCULAR | Status: DC
  Administered 2012-12-02 (×2): 1400 [IU]/h via INTRAVENOUS
  Filled 2012-12-02 (×3): qty 250

## 2012-12-02 MED ORDER — OXYCODONE-ACETAMINOPHEN 5-325 MG PO TABS
1.0000 | ORAL_TABLET | ORAL | Status: DC | PRN
  Administered 2012-12-02 – 2012-12-04 (×4): 1 via ORAL
  Filled 2012-12-02 (×4): qty 1

## 2012-12-02 MED ORDER — SODIUM CHLORIDE 0.9 % IJ SOLN: 3.0000 mL | Freq: Two times a day (BID) | INTRAMUSCULAR | Status: DC

## 2012-12-02 MED ORDER — GUAIFENESIN ER 600 MG PO TB12
600.0000 mg | ORAL_TABLET | Freq: Two times a day (BID) | ORAL | Status: DC
  Administered 2012-12-02 – 2012-12-04 (×4): 600 mg via ORAL
  Filled 2012-12-02 (×5): qty 1

## 2012-12-02 MED ORDER — IPRATROPIUM BROMIDE 0.02 % IN SOLN: 0.5000 mg | Freq: Four times a day (QID) | RESPIRATORY_TRACT | Status: DC | PRN

## 2012-12-02 MED ORDER — ASPIRIN EC 81 MG PO TBEC
81.0000 mg | DELAYED_RELEASE_TABLET | Freq: Every day | ORAL | Status: DC
  Administered 2012-12-02 – 2012-12-05 (×4): 81 mg via ORAL
  Filled 2012-12-02 (×4): qty 1

## 2012-12-02 MED ORDER — OXYBUTYNIN CHLORIDE 5 MG PO TABS
5.0000 mg | ORAL_TABLET | Freq: Every day | ORAL | Status: DC
  Administered 2012-12-02 – 2012-12-04 (×3): 5 mg via ORAL
  Filled 2012-12-02 (×4): qty 1

## 2012-12-02 MED ORDER — DIAZEPAM 5 MG PO TABS: 5.0000 mg | ORAL_TABLET | Freq: Two times a day (BID) | ORAL | Status: DC | PRN

## 2012-12-02 MED ORDER — MORPHINE SULFATE 4 MG/ML IJ SOLN
4.0000 mg | Freq: Once | INTRAMUSCULAR | Status: AC
  Administered 2012-12-02: 4 mg via INTRAVENOUS
  Filled 2012-12-02: qty 1

## 2012-12-02 MED ORDER — ATORVASTATIN CALCIUM 40 MG PO TABS
40.0000 mg | ORAL_TABLET | Freq: Every day | ORAL | Status: DC
  Administered 2012-12-02 – 2012-12-05 (×4): 40 mg via ORAL
  Filled 2012-12-02 (×4): qty 1

## 2012-12-02 MED ORDER — DULOXETINE HCL 60 MG PO CPEP
90.0000 mg | ORAL_CAPSULE | Freq: Every day | ORAL | Status: DC
  Administered 2012-12-02 – 2012-12-05 (×4): 90 mg via ORAL
  Filled 2012-12-02 (×4): qty 1

## 2012-12-02 MED ORDER — CYCLOSPORINE 0.05 % OP EMUL
1.0000 [drp] | Freq: Two times a day (BID) | OPHTHALMIC | Status: DC
  Administered 2012-12-02 – 2012-12-05 (×6): 1 [drp] via OPHTHALMIC
  Filled 2012-12-02 (×8): qty 1

## 2012-12-02 MED ORDER — NITROGLYCERIN IN D5W 200-5 MCG/ML-% IV SOLN
2.0000 ug/min | INTRAVENOUS | Status: DC
  Administered 2012-12-02: 3 ug/min via INTRAVENOUS
  Administered 2012-12-03: 5 ug/min via INTRAVENOUS
  Filled 2012-12-02: qty 250

## 2012-12-02 MED ORDER — OXYCODONE-ACETAMINOPHEN 10-325 MG PO TABS: 1.0000 | ORAL_TABLET | ORAL | Status: DC | PRN

## 2012-12-02 MED ORDER — DULOXETINE HCL 60 MG PO CPEP: 60.0000 mg | ORAL_CAPSULE | Freq: Every day | ORAL | Status: DC

## 2012-12-02 MED ORDER — TRAMADOL HCL 50 MG PO TABS: 50.0000 mg | ORAL_TABLET | Freq: Four times a day (QID) | ORAL | Status: DC | PRN

## 2012-12-02 MED ORDER — HEPARIN BOLUS VIA INFUSION
4000.0000 [IU] | Freq: Once | INTRAVENOUS | Status: AC
  Administered 2012-12-02: 4000 [IU] via INTRAVENOUS
  Filled 2012-12-02: qty 4000

## 2012-12-02 MED ORDER — ALBUTEROL SULFATE (5 MG/ML) 0.5% IN NEBU: 2.5000 mg | INHALATION_SOLUTION | Freq: Four times a day (QID) | RESPIRATORY_TRACT | Status: DC | PRN

## 2012-12-02 MED ORDER — AZATHIOPRINE 50 MG PO TABS
50.0000 mg | ORAL_TABLET | Freq: Two times a day (BID) | ORAL | Status: DC
  Administered 2012-12-02 – 2012-12-05 (×6): 50 mg via ORAL
  Filled 2012-12-02 (×7): qty 1

## 2012-12-02 MED ORDER — ONDANSETRON HCL 4 MG/2ML IJ SOLN: 4.0000 mg | Freq: Three times a day (TID) | INTRAMUSCULAR | Status: DC | PRN

## 2012-12-02 MED ORDER — ONDANSETRON HCL 4 MG PO TABS: 4.0000 mg | ORAL_TABLET | Freq: Four times a day (QID) | ORAL | Status: DC | PRN

## 2012-12-02 MED ORDER — GABAPENTIN 300 MG PO CAPS
900.0000 mg | ORAL_CAPSULE | Freq: Four times a day (QID) | ORAL | Status: DC
  Administered 2012-12-02 – 2012-12-05 (×11): 900 mg via ORAL
  Filled 2012-12-02 (×14): qty 3

## 2012-12-02 MED ORDER — SODIUM CHLORIDE 0.9 % IJ SOLN
3.0000 mL | Freq: Two times a day (BID) | INTRAMUSCULAR | Status: DC
  Administered 2012-12-02 – 2012-12-04 (×3): 3 mL via INTRAVENOUS

## 2012-12-02 MED ORDER — IPRATROPIUM BROMIDE 0.02 % IN SOLN
0.5000 mg | Freq: Four times a day (QID) | RESPIRATORY_TRACT | Status: DC
  Administered 2012-12-02: 0.5 mg via RESPIRATORY_TRACT
  Filled 2012-12-02: qty 2.5

## 2012-12-02 MED ORDER — MORPHINE SULFATE 4 MG/ML IJ SOLN: 4.0000 mg | INTRAMUSCULAR | Status: DC | PRN

## 2012-12-02 MED ORDER — OXYCODONE HCL 5 MG PO TABS
5.0000 mg | ORAL_TABLET | ORAL | Status: DC | PRN
  Administered 2012-12-02 – 2012-12-04 (×3): 5 mg via ORAL
  Filled 2012-12-02 (×3): qty 1

## 2012-12-02 MED ORDER — AMITRIPTYLINE HCL 25 MG PO TABS
25.0000 mg | ORAL_TABLET | Freq: Every day | ORAL | Status: DC
  Administered 2012-12-02 – 2012-12-04 (×3): 25 mg via ORAL
  Filled 2012-12-02 (×4): qty 1

## 2012-12-02 MED ORDER — DULOXETINE HCL 30 MG PO CPEP: 30.0000 mg | ORAL_CAPSULE | Freq: Every day | ORAL | Status: DC

## 2012-12-02 MED ORDER — ALBUTEROL SULFATE (5 MG/ML) 0.5% IN NEBU
2.5000 mg | INHALATION_SOLUTION | Freq: Four times a day (QID) | RESPIRATORY_TRACT | Status: DC
  Administered 2012-12-02: 2.5 mg via RESPIRATORY_TRACT
  Filled 2012-12-02: qty 0.5

## 2012-12-02 MED ORDER — DOCUSATE SODIUM 100 MG PO CAPS
100.0000 mg | ORAL_CAPSULE | Freq: Two times a day (BID) | ORAL | Status: DC
  Administered 2012-12-02 – 2012-12-05 (×6): 100 mg via ORAL
  Filled 2012-12-02 (×7): qty 1

## 2012-12-02 MED ORDER — SODIUM CHLORIDE 0.9 % IJ SOLN: 3.0000 mL | INTRAMUSCULAR | Status: DC | PRN

## 2012-12-02 MED ORDER — SODIUM CHLORIDE 0.9 % IV SOLN: 250.0000 mL | INTRAVENOUS | Status: DC | PRN

## 2012-12-02 MED ORDER — BUPROPION HCL ER (XL) 300 MG PO TB24
300.0000 mg | ORAL_TABLET | Freq: Every morning | ORAL | Status: DC
  Administered 2012-12-02 – 2012-12-05 (×4): 300 mg via ORAL
  Filled 2012-12-02 (×4): qty 1

## 2012-12-02 MED ORDER — ONDANSETRON HCL 4 MG/2ML IJ SOLN
4.0000 mg | Freq: Four times a day (QID) | INTRAMUSCULAR | Status: DC | PRN
  Administered 2012-12-02: 4 mg via INTRAVENOUS
  Filled 2012-12-02: qty 2

## 2012-12-02 MED ORDER — SENNA 8.6 MG PO TABS
1.0000 | ORAL_TABLET | Freq: Two times a day (BID) | ORAL | Status: DC
  Administered 2012-12-02 – 2012-12-05 (×7): 8.6 mg via ORAL
  Filled 2012-12-02 (×8): qty 1

## 2012-12-02 MED ORDER — ATENOLOL 25 MG PO TABS
25.0000 mg | ORAL_TABLET | Freq: Two times a day (BID) | ORAL | Status: DC
  Administered 2012-12-02 – 2012-12-04 (×5): 25 mg via ORAL
  Filled 2012-12-02 (×6): qty 1

## 2012-12-02 MED ORDER — OXYCODONE-ACETAMINOPHEN 5-325 MG PO TABS: 1.0000 | ORAL_TABLET | ORAL | Status: DC | PRN

## 2012-12-02 MED ORDER — PANTOPRAZOLE SODIUM 40 MG PO TBEC
40.0000 mg | DELAYED_RELEASE_TABLET | Freq: Every day | ORAL | Status: DC
Start: 2012-12-02 — End: 2012-12-05
  Administered 2012-12-02 – 2012-12-05 (×4): 40 mg via ORAL
  Filled 2012-12-02 (×4): qty 1

## 2012-12-02 MED ORDER — MORPHINE SULFATE 2 MG/ML IJ SOLN
2.0000 mg | INTRAMUSCULAR | Status: DC | PRN
  Administered 2012-12-02 – 2012-12-04 (×6): 2 mg via INTRAVENOUS
  Filled 2012-12-02 (×6): qty 1

## 2012-12-02 NOTE — Progress Notes (Signed)
ANTICOAGULATION CONSULT NOTE - Initial Consult  Pharmacy Consult for heparin Indication: chest pain/ACS  Allergies  Allergen Reactions  . Adhesive (Tape) Other (See Comments)    Skin irriation    Patient Measurements: Heparin Dosing Weight: 95kg  Vital Signs: Temp: 98.4 F (36.9 C) (03/28 1705) Temp src: Oral (03/28 1705) BP: 111/70 mmHg (03/29 0230) Pulse Rate: 72 (03/29 0230)  Labs:  Recent Labs  12/01/12 1707  HGB 14.9  HCT 43.6  PLT 313  CREATININE 0.99    Medical History: Past Medical History  Diagnosis Date  . B12 DEFICIENCY 03/06/2007  . CARDIOMYOPATHY, IDIOPATHIC HYPERTROPHIC 03/06/2007  . DEPRESSION 03/06/2007  . GERD 06/01/2007  . HYPERCHOLESTEROLEMIA 03/06/2007  . HYPERLIPIDEMIA 11/08/2008  . IMPAIRED GLUCOSE TOLERANCE 11/08/2008  . NEPHROLITHIASIS, HX OF 04/07/2010  . 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 OSTEOARTHRITIS 06/01/2007  . Palpitations 10/23/2007  . SYNDROME, CHRONIC PAIN 03/06/2007  . Shortness of breath   . Cancer     prostate ca  . Prostate cancer   . HYPERTENSION 03/06/2007    Dr. Amador Cunas  . Pneumonia     hx of  . SLEEP APNEA 03/06/2007    uses vpap, last sleep study 2011, Dr. Rhona Raider, at [facility]  . Kidney stones     hx of , had surgery for removal  . Dysrhythmia     sees Dr. Jens Som for "palpitations"  . DVT (deep venous thrombosis)     Medications:  Prescriptions prior to admission  Medication Sig Dispense Refill  . albuterol (PROVENTIL HFA;VENTOLIN HFA) 108 (90 BASE) MCG/ACT inhaler Inhale 2 puffs into the lungs every 6 (six) hours as needed for wheezing or shortness of breath.       Marland Kitchen amitriptyline (ELAVIL) 25 MG tablet Take 1 tablet (25 mg total) by mouth at bedtime.  30 tablet  2  . ARTIFICIAL TEAR OP Place 1 drop into both eyes 2 (two) times daily. Preservative free      . atenolol (TENORMIN) 25 MG tablet Take 25 mg by mouth 2 (two) times daily.      Marland Kitchen atorvastatin (LIPITOR) 40 MG tablet Take 40 mg by mouth daily.      Marland Kitchen  azaTHIOprine (IMURAN) 50 MG tablet Take 50 mg by mouth 2 (two) times daily.       Marland Kitchen buPROPion (WELLBUTRIN XL) 300 MG 24 hr tablet Take 300 mg by mouth every morning.      . cyanocobalamin (COBAL-1000) 1000 MCG/ML injection Inject 1,000 mcg into the muscle every 30 (thirty) days.       . cycloSPORINE (RESTASIS) 0.05 % ophthalmic emulsion Place 1 drop into both eyes 2 (two) times daily.       . diazepam (VALIUM) 5 MG tablet Take 5 mg by mouth 2 (two) times daily as needed. For anxiety      . dicyclomine (BENTYL) 10 MG capsule Take 10 mg by mouth 4 (four) times daily.      Marland Kitchen docusate sodium (COLACE) 100 MG capsule Take 100 mg by mouth 2 (two) times daily.      . DULoxetine (CYMBALTA) 30 MG capsule Take 30 mg by mouth daily. Along with 60mg  to=90mg       . DULoxetine (CYMBALTA) 60 MG capsule Take 60 mg by mouth daily. Along with 30mg  to =90mg       . esomeprazole (NEXIUM) 40 MG capsule Take 40 mg by mouth every evening.      . gabapentin (NEURONTIN) 300 MG capsule Take 900 mg by mouth 4 (four)  times daily.       Marland Kitchen guaiFENesin (MUCINEX) 600 MG 12 hr tablet Take 600 mg by mouth 2 (two) times daily.       . Menthol-Methyl Salicylate (MUSCLE RUB EX) Apply 1 application topically at bedtime as needed. For pain  To back      . OVER THE COUNTER MEDICATION Place 1 application into both eyes at bedtime. "Eye Gel"      . oxybutynin (DITROPAN) 5 MG tablet Take 5 mg by mouth at bedtime.      Marland Kitchen oxyCODONE-acetaminophen (PERCOCET) 10-325 MG per tablet Take 1 tablet by mouth every 4 (four) hours as needed for pain.  100 tablet  0  . predniSONE (DELTASONE) 5 MG tablet Take 5 mg by mouth daily.       . promethazine (PHENERGAN) 25 MG tablet Take 25 mg by mouth every 6 (six) hours as needed for nausea.       . traMADol (ULTRAM) 50 MG tablet Take 50 mg by mouth every 6 (six) hours as needed for pain.        Scheduled:  . albuterol  2.5 mg Nebulization Q6H  . amitriptyline  25 mg Oral QHS  . aspirin EC  81 mg Oral Daily   . atenolol  25 mg Oral BID  . atorvastatin  40 mg Oral Daily  . buPROPion  300 mg Oral q morning - 10a  . heparin  4,000 Units Intravenous Once  . ipratropium  0.5 mg Nebulization Q6H  . [COMPLETED] ketorolac  30 mg Intravenous Once  . [COMPLETED]  morphine injection  4 mg Intravenous Once  . [COMPLETED]  morphine injection  4 mg Intravenous Once  . predniSONE  5 mg Oral Daily  . senna  1 tablet Oral BID  . sodium chloride  3 mL Intravenous Q12H  . sodium chloride  3 mL Intravenous Q12H    Assessment: 47yo Ferguson c/o acute onset of sharp CP at rest and aggravated with exertion, associated with diaphoresis, palpitations, SOB; initial troponin negative, last cath in 1999, stress test in 2011; to begin heparin.  Goal of Therapy:  Heparin level 0.3-0.7 units/ml Monitor platelets by anticoagulation protocol: Yes   Plan:  Will give heparin 4000 units IV bolus x1 followed by gtt at 1400 units/hr and monitor heparin levels and CBC.  Vernard Gambles, PharmD, BCPS  12/02/2012,4:00 AM

## 2012-12-02 NOTE — Consult Note (Addendum)
Admit date: 12/01/2012 Referring Physician  Dr. Butler Denmark Primary Physician  Dr. Amador Cunas Primary Cardiologist  Dr. Jens Som Reason for Consultation  Chest pain  HPI: Patient is a 47 y.o. male with a PMHx of idiopathic hypertrophic cardiomyopathy, depression, hypercholesterolemia, reflux disease, hypertension, neurosarcoidosis, palpitations and impaired glucose tolerance with multiple ER visit and admissions in the past for similar chest pain presented to [facility] for evaluation of acute onset chest pain that began several days prior to admission while the patient was sitting at home. Pain is described as a sharp chest pain located over mid chest. The pain is rated as a 10/10 in severity at onset and currently rated 7/10. The pain initially was intermittent but now has been constant for 24 hours.  The pain is notably aggravated by walking, lifting, exertion. It is alleviated by none. has associated diaphoresis, palpitations and shortness of breath. For the pain, the patient tried nothing for pain relief.  The patient has experienced similar symptoms previously. Secondary to his constellation of symptoms, [name] presented to [facility] for further evaluation. Otherwise, the patient denies nausea, near syncope and syncope. Cardiology is now asked to consult for further evaluation.     PMH:   Past Medical History  Diagnosis Date  . B12 DEFICIENCY 03/06/2007  . CARDIOMYOPATHY, IDIOPATHIC HYPERTROPHIC 03/06/2007  . DEPRESSION 03/06/2007  . GERD 06/01/2007  . HYPERCHOLESTEROLEMIA 03/06/2007  . HYPERLIPIDEMIA 11/08/2008  . IMPAIRED GLUCOSE TOLERANCE 11/08/2008  . NEPHROLITHIASIS, HX OF 04/07/2010  . 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 OSTEOARTHRITIS 06/01/2007  . Palpitations 10/23/2007  . SYNDROME, CHRONIC PAIN 03/06/2007  . Shortness of breath   . Cancer     prostate ca  . Prostate cancer   . HYPERTENSION 03/06/2007    Dr. Amador Cunas  . Pneumonia     hx of  . Kidney stones     hx of , had surgery for removal   . Dysrhythmia     sees Dr. Jens Som for "palpitations"  . DVT (deep venous thrombosis)   . Anginal pain   . Anxiety   . SLEEP APNEA 03/06/2007    uses vpap, last sleep study 2011, Dr. Rhona Raider, at [facility]     PSH:   Past Surgical History  Procedure Laterality Date  . Decompression and fusion      cervicothoracic  . Partial nephrectomy    . Cardiac catheterization    . Lumbar fusion    . Prostate surgery      robotic prostatectomy  . Penile prosthesis implant    . Tonsillectomy    . Leg skin lesion  biopsy / excision      left leg    Allergies:  Adhesive Prior to Admit Meds:   Prescriptions prior to admission  Medication Sig Dispense Refill  . albuterol (PROVENTIL HFA;VENTOLIN HFA) 108 (90 BASE) MCG/ACT inhaler Inhale 2 puffs into the lungs every 6 (six) hours as needed for wheezing or shortness of breath.       Marland Kitchen amitriptyline (ELAVIL) 25 MG tablet Take 1 tablet (25 mg total) by mouth at bedtime.  30 tablet  2  . ARTIFICIAL TEAR OP Place 1 drop into both eyes 2 (two) times daily. Preservative free      . atenolol (TENORMIN) 25 MG tablet Take 25 mg by mouth 2 (two) times daily.      Marland Kitchen atorvastatin (LIPITOR) 40 MG tablet Take 40 mg by mouth daily.      Marland Kitchen azaTHIOprine (IMURAN) 50 MG tablet Take 50 mg  by mouth 2 (two) times daily.       Marland Kitchen buPROPion (WELLBUTRIN XL) 300 MG 24 hr tablet Take 300 mg by mouth every morning.      . cyanocobalamin (COBAL-1000) 1000 MCG/ML injection Inject 1,000 mcg into the muscle every 30 (thirty) days.       . cycloSPORINE (RESTASIS) 0.05 % ophthalmic emulsion Place 1 drop into both eyes 2 (two) times daily.       . diazepam (VALIUM) 5 MG tablet Take 5 mg by mouth 2 (two) times daily as needed. For anxiety      . dicyclomine (BENTYL) 10 MG capsule Take 10 mg by mouth 4 (four) times daily.      Marland Kitchen docusate sodium (COLACE) 100 MG capsule Take 100 mg by mouth 2 (two) times daily.      . DULoxetine (CYMBALTA) 30 MG capsule Take 30 mg by mouth daily. Along  with 60mg  to=90mg       . DULoxetine (CYMBALTA) 60 MG capsule Take 60 mg by mouth daily. Along with 30mg  to =90mg       . esomeprazole (NEXIUM) 40 MG capsule Take 40 mg by mouth every evening.      . gabapentin (NEURONTIN) 300 MG capsule Take 900 mg by mouth 4 (four) times daily.       Marland Kitchen guaiFENesin (MUCINEX) 600 MG 12 hr tablet Take 600 mg by mouth 2 (two) times daily.       . Menthol-Methyl Salicylate (MUSCLE RUB EX) Apply 1 application topically at bedtime as needed. For pain  To back      . OVER THE COUNTER MEDICATION Place 1 application into both eyes at bedtime. "Eye Gel"      . oxybutynin (DITROPAN) 5 MG tablet Take 5 mg by mouth at bedtime.      Marland Kitchen oxyCODONE-acetaminophen (PERCOCET) 10-325 MG per tablet Take 1 tablet by mouth every 4 (four) hours as needed for pain.  100 tablet  0  . predniSONE (DELTASONE) 5 MG tablet Take 5 mg by mouth daily.       . promethazine (PHENERGAN) 25 MG tablet Take 25 mg by mouth every 6 (six) hours as needed for nausea.       . traMADol (ULTRAM) 50 MG tablet Take 50 mg by mouth every 6 (six) hours as needed for pain.        Fam HX:    Family History  Problem Relation Age of Onset  . Heart disease Neg Hx     family hx CHF  . Kidney disease Neg Hx     family hx   . Cancer Neg Hx     family hx prostate   Social HX:    History   Social History  . Marital Status: Married    Spouse Name: N/A    Number of Children: N/A  . 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 Smoking status: Former Smoker    Quit date: 09/07/1983  . Smokeless tobacco: Never Used  . Alcohol Use: No  . Drug Use: No  . Sexually Active: Yes   Other Topics Concern  . Not on file   Social History Narrative  . No narrative on file     ROS:  All 11 ROS were addressed and are negative except what is stated in the HPI  Physical Exam: Blood pressure 114/63, pulse 64, temperature 98.2 F (36.8 C), temperature source Oral,  resp. rate 19, height 5\' 9"  (1.753 m), weight 104.1 kg (229 lb 8 oz), SpO2 96.00%.    General: Well developed, well nourished, in no acute distress Head: Eyes PERRLA, No xanthomas.   Normal cephalic and atramatic  Lungs:   Clear bilaterally to auscultation and percussion. Heart:   HRRR S1 S2 Pulses are 2+ & equal.            No carotid bruit. No JVD.  No abdominal bruits. No femoral bruits. Abdomen: Bowel sounds are positive, abdomen soft and non-tender without masses Extremities:   No clubbing, cyanosis or edema.  DP +1 Neuro: Alert and oriented X 3. Psych:  Good affect, responds appropriately    Labs:   Lab Results  Component Value Date   WBC 3.9* 12/01/2012   HGB 14.9 12/01/2012   HCT 43.6 12/01/2012   MCV 85.8 12/01/2012   PLT 313 12/01/2012    Recent Labs Lab 12/01/12 1707  NA 137  K 4.5  CL 100  CO2 27  BUN 13  CREATININE 0.99  CALCIUM 9.5  GLUCOSE 96   No results found for this basename: PTT   Lab Results  Component Value Date   INR 1.09 08/17/2011   INR 1.0 05/27/2008   INR 1.0 03/25/2007   Lab Results  Component Value Date   CKTOTAL 90 12/30/2011   CKMB 2.1 12/30/2011   TROPONINI <0.30 12/02/2012     Lab Results  Component Value Date   CHOL 130 12/29/2011   CHOL 141 05/25/2011   CHOL 123 04/07/2010   Lab Results  Component Value Date   HDL 56 12/29/2011   HDL 55.70 05/25/2011   HDL 16.10 07/10/2009   Lab Results  Component Value Date   LDLCALC 66 12/29/2011   LDLCALC 72 05/25/2011   LDLCALC 48 07/10/2009   Lab Results  Component Value Date   TRIG 38 12/29/2011   TRIG 69.0 05/25/2011   TRIG 53.0 07/10/2009   Lab Results  Component Value Date   CHOLHDL 2.3 12/29/2011   CHOLHDL 3 05/25/2011   CHOLHDL 2 07/10/2009   No results found for this basename: LDLDIRECT      Radiology:  Dg Chest 2 View  12/01/2012  *RADIOLOGY REPORT*  Clinical Data: Chest pain.  History of hypertension.  CHEST - 2 VIEW  Comparison: 11/10/2012 and chest CTA dated 09/09/2012.   Findings: Normal sized heart.  Stable mild prominence of the right hilum, with associated adenopathy on the previous CT.  The lungs remain clear with stable mild prominence of the interstitial markings.  There is some diaphragmatic flattening on the lateral view.  Minimal thoracic spine degenerative changes.  IMPRESSION:  1.  No acute abnormality. 2.  Stable mild right hilar adenopathy. 3.  Stable mild changes of COPD.   Original Report Authenticated By: Beckie Salts, M.D.     EKG:  Sinus bradycardia with early repolarization  ASSESSMENT:  1.  Atypical chest pain with negative cardiac enzymes and no ischemia on EKG.  He does have CRF including age, HTN, dyslipidemia, obesity.  He has a history of remote cath in 1999 and negative stress test in 12/2011.  He has had constant CP for 24 hours with no abnormalities in cardiac enzymes and therefore most likely not cardiac.  It is very atypical as well. D-Dimer negative 2.  HOCM 3.  HTN 4.  Dyslipidemia  PLAN:   1.  Continue ASA/beta blocker/statin/Heparin gtt/nitro gtt 2.  2 D echo to assess LVF 3.  Further evaluation pending results of echo but given nuclear stress test a year ago in the setting of similar symptoms not convinced further cardiac w/u is indicated at this time. Quintella Reichert, MD  12/02/2012  5:47 PM

## 2012-12-02 NOTE — H&P (Signed)
[facility]  admission note  Date: 12/02/2012               Patient Name:  [name] MRN: [id number]  DOB: 03/17/1966 Age / Sex: 47 y.o., male   PCP: Gordy Savers, MD              Chief Complaint: Chest pain  History of Present Illness: Patient is a 47 y.o. male with a PMHx of idiopathic hypertrophic cardiomyopathy, depression, hypercholesterolemia, reflux disease, hypertension, neurosarcoidosis, palpitations and impaired glucose tolerance with multiple ER visit and admissions in the past for similar chest pain presented to [facility] for evaluation of acute onset chest pain that began on the morning of admission while the patient was sitting at home. Pain is described as a sharp chest pain located over mid chest. The pain is rated as a 10/10 in severity at onset and currently rated 7/10. The pain is notably aggravated by walking, lifting, exertion. It is alleviated by none. has associated diaphoresis, palpitations and shortness of breath. For the pain, the patient tried nothing for pain relief.  The patient has experienced similar symptoms previously. Secondary to his constellation of symptoms, [name] presented to [facility] for further evaluation. Otherwise, the patient denies nausea, near syncope and syncope.   Current Outpatient Medications:  (Not in a hospital admission)  Allergies: Allergies  Allergen Reactions  . Adhesive (Tape) Other (See Comments)    Skin irriation     Past Medical History: Past Medical History  Diagnosis Date  . B12 DEFICIENCY 03/06/2007  . CARDIOMYOPATHY, IDIOPATHIC HYPERTROPHIC 03/06/2007  . DEPRESSION 03/06/2007  . GERD 06/01/2007  . HYPERCHOLESTEROLEMIA 03/06/2007  . HYPERLIPIDEMIA 11/08/2008  . IMPAIRED GLUCOSE TOLERANCE 11/08/2008  . NEPHROLITHIASIS, HX OF 04/07/2010  . 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 OSTEOARTHRITIS 06/01/2007  . Palpitations 10/23/2007  . SYNDROME, CHRONIC PAIN 03/06/2007  . Shortness of breath   . Cancer     prostate ca   . Prostate cancer   . HYPERTENSION 03/06/2007    Dr. Amador Cunas  . Pneumonia     hx of  . SLEEP APNEA 03/06/2007    uses vpap, last sleep study 2011, Dr. Rhona Raider, at [facility]  . Kidney stones     hx of , had surgery for removal  . Dysrhythmia     sees Dr. Jens Som for "palpitations"  . DVT (deep venous thrombosis)     Past Surgical History: Past Surgical History  Procedure Laterality Date  . Decompression and fusion      cervicothoracic  . Partial nephrectomy    . Cardiac catheterization    . Lumbar fusion    . Prostate surgery      robotic prostatectomy  . Penile prosthesis implant    . Tonsillectomy    . Leg skin lesion  biopsy / excision      left leg    Family History: Family History  Problem Relation Age of Onset  . Heart disease Neg Hx     family hx CHF  . Kidney disease Neg Hx     family hx   . Cancer Neg Hx     family hx prostate    Social History: History   Social History  . Marital Status: Married    Spouse Name: N/A    Number of Children: N/A  . 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 Smoking status: Former Smoker  Quit date: 09/07/1983  . Smokeless tobacco: Never Used  . Alcohol Use: No  . Drug Use: No  . Sexually Active: Yes   Other Topics Concern  . Not on file   Social History Narrative  . No narrative on file    Review of Systems: Constitutional:  denies fever, chills, appetite change and fatigue.  HEENT: denies photophobia, eye pain, redness, hearing loss, ear pain, congestion, sore throat, rhinorrhea, sneezing, neck pain, neck stiffness and tinnitus.  Respiratory: denies DOE, cough and wheezing.  Cardiovascular: denies leg swelling.  Gastrointestinal: denies nausea, vomiting, abdominal pain, diarrhea, constipation, blood in stool.  Genitourinary: denies dysuria, urgency, frequency, hematuria, flank pain and difficulty urinating.  Musculoskeletal: denies  myalgias,  back pain, joint swelling, arthralgias and gait problem.   Skin: denies pallor, rash and wound.  Neurological: denies dizziness, seizures, syncope, weakness, light-headedness, numbness and headaches.   Hematological: denies adenopathy, easy bruising, personal or family bleeding history.  Psychiatric/ Behavioral: denies suicidal ideation, mood changes, confusion, nervousness, sleep disturbance and agitation.    Vital Signs: Blood pressure 110/61, pulse 61, temperature 98.4 F (36.9 C), temperature source Oral, resp. rate 17, SpO2 97.00%.  Physical Exam: General: Vital signs reviewed and noted. Well-developed, well-nourished, in no acute distress; alert, appropriate and cooperative throughout examination.  Head: Normocephalic, atraumatic.  Eyes: PERRL, EOMI, No signs of anemia or jaundince.  Nose: Mucous membranes moist, not inflammed, nonerythematous.  Throat: Oropharynx nonerythematous, no exudate appreciated.   Neck: No deformities, masses, or tenderness noted.Supple, No carotid Bruits, no JVD.  Lungs:  Normal respiratory effort. Clear to auscultation BL without crackles or wheezes.  Heart:  pain was not reproducible on deep palpation of chest, RRR. S1 and S2 normal without gallop, murmur, or rubs.  Abdomen:  BS normoactive. Soft, Nondistended, non-tender.  No masses or organomegaly.  Extremities: No pretibial edema.  Neurologic: A&O X3, CN II - XII are grossly intact. Motor strength is 5/5 in the all 4 extremities, Sensations intact to light touch, Cerebellar signs negative.  Skin: No visible rashes, scars.    Lab results: Basic Metabolic Panel:  Recent Labs  40/98/11 1707  NA 137  K 4.5  CL 100  CO2 27  GLUCOSE 96  BUN 13  CREATININE 0.99  CALCIUM 9.5   CBC:  Recent Labs  12/01/12 1707  WBC 3.9*  HGB 14.9  HCT 43.6  MCV 85.8  PLT 313   D-Dimer:  Recent Labs  12/01/12 2018  DDIMER <0.27   Urine Drug Screen: Drugs of Abuse  No results found for this  basename: labopia, cocainscrnur, labbenz, amphetmu, thcu, labbarb    Alcohol Level: No results found for this basename: ETH,  in the last 72 hours Urinalysis: No results found for this basename: COLORURINE, APPERANCEUR, LABSPEC, PHURINE, GLUCOSEU, HGBUR, BILIRUBINUR, KETONESUR, PROTEINUR, UROBILINOGEN, NITRITE, LEUKOCYTESUR,  in the last 72 hours    Imaging results:  Dg Chest 2 View  12/01/2012  *RADIOLOGY REPORT*  Clinical Data: Chest pain.  History of hypertension.  CHEST - 2 VIEW  Comparison: 11/10/2012 and chest CTA dated 09/09/2012.  Findings: Normal sized heart.  Stable mild prominence of the right hilum, with associated adenopathy on the previous CT.  The lungs remain clear with stable mild prominence of the interstitial markings.  There is some diaphragmatic flattening on the lateral view.  Minimal thoracic spine degenerative changes.  IMPRESSION:  1.  No acute abnormality. 2.  Stable mild right hilar adenopathy. 3.  Stable mild changes of COPD.  Original Report Authenticated By: Beckie Salts, M.D.       Other results:  EKG (12/02/2012) - Normal Sinus Rhythm, regular rate of approximately  81 bpm, normal axis, ST segments: normal.    Last Echo (April 2013): 60-65% ejection fraction with moderate left ventricular hypertrophy   Last Cath in 1999      Assessment & Plan: Pt is a 47 y.o. yo male with a PMHX of hypertension, hyperlipidemia, neurosarcoid, idiopathic hypertrophy and multiple episodes of chest pain in the past, who presented to [facility] on 12/01/2012 with symptoms of mid chest chest pain, with EKG showing no changes and initial troponins of negative, thereby confirming unstable angina. Therefore, interventions at this time will be focused on risk stratification.     Unstable angina - Patient presents with typical history of unstable angina which has progressively been getting worse over last one week. patient does not have known history of coronary artery disease. He also  has cardiac risk factors including the following: hypertension  hyperlipidemia  obese  sedentary lifestyle. Admission EKG showed normal sinus rhythm. Catheterization has been performed in 1999 and patient has had negative stress test most recent one in 2011 and he used to see Dr. Jens Som. Therefore, management at this time will be focused on reducing the ongoing chest pain. We will also strict risk factor modification and management.   Admit to CCU due to ongoing active 7/10 chest pain  Continue to cycle cardiac enzymes.  Keep n.p.o. for possible interventions in the morning  Nitro drip  Continue aspirin, beta blocker, statin therapy.  Continue morphine, oxygen, PRN nitroglycerin, heparin gtt.  Check HgA1c for risk factor stratification.   Consult cardiology in the morning  12-lead EKG in the morning    DVT PPX - heparin  CODE STATUS - full code   Signed: Lars Mage, MD  12/02/2012, 1:41 AM

## 2012-12-02 NOTE — ED Notes (Signed)
Informed md that pt is still having chest pain.  States he will order a step down bed and a nitro drip

## 2012-12-02 NOTE — Progress Notes (Signed)
ANTICOAGULATION CONSULT NOTE - Follow Up Consult  Pharmacy Consult for heparin Indication: chest pain/ACS  Allergies  Allergen Reactions  . Adhesive (Tape) Other (See Comments)    Skin irriation    Patient Measurements: Height: 5\' 9"  (175.3 cm) Weight: 229 lb 8 oz (104.1 kg) IBW/kg (Calculated) : 70.7 Heparin Dosing Weight: 95 kg  Vital Signs: Temp: 98.1 F (36.7 C) (03/29 1100) Temp src: Oral (03/29 1100) BP: 97/69 mmHg (03/29 1100) Pulse Rate: 69 (03/29 1100)  Labs:  Recent Labs  12/01/12 1707 12/02/12 0353 12/02/12 0859 12/02/12 1107  HGB 14.9  --   --   --   HCT 43.6  --   --   --   PLT 313  --   --   --   HEPARINUNFRC  --   --   --  0.60  CREATININE 0.99  --   --   --   TROPONINI  --  <0.30 <0.30  --     Estimated Creatinine Clearance: 109.7 ml/min (by C-G formula based on Cr of 0.99).   Medications:  Prescriptions prior to admission  Medication Sig Dispense Refill  . albuterol (PROVENTIL HFA;VENTOLIN HFA) 108 (90 BASE) MCG/ACT inhaler Inhale 2 puffs into the lungs every 6 (six) hours as needed for wheezing or shortness of breath.       Marland Kitchen amitriptyline (ELAVIL) 25 MG tablet Take 1 tablet (25 mg total) by mouth at bedtime.  30 tablet  2  . ARTIFICIAL TEAR OP Place 1 drop into both eyes 2 (two) times daily. Preservative free      . atenolol (TENORMIN) 25 MG tablet Take 25 mg by mouth 2 (two) times daily.      Marland Kitchen atorvastatin (LIPITOR) 40 MG tablet Take 40 mg by mouth daily.      Marland Kitchen azaTHIOprine (IMURAN) 50 MG tablet Take 50 mg by mouth 2 (two) times daily.       Marland Kitchen buPROPion (WELLBUTRIN XL) 300 MG 24 hr tablet Take 300 mg by mouth every morning.      . cyanocobalamin (COBAL-1000) 1000 MCG/ML injection Inject 1,000 mcg into the muscle every 30 (thirty) days.       . cycloSPORINE (RESTASIS) 0.05 % ophthalmic emulsion Place 1 drop into both eyes 2 (two) times daily.       . diazepam (VALIUM) 5 MG tablet Take 5 mg by mouth 2 (two) times daily as needed. For anxiety       . dicyclomine (BENTYL) 10 MG capsule Take 10 mg by mouth 4 (four) times daily.      Marland Kitchen docusate sodium (COLACE) 100 MG capsule Take 100 mg by mouth 2 (two) times daily.      . DULoxetine (CYMBALTA) 30 MG capsule Take 30 mg by mouth daily. Along with 60mg  to=90mg       . DULoxetine (CYMBALTA) 60 MG capsule Take 60 mg by mouth daily. Along with 30mg  to =90mg       . esomeprazole (NEXIUM) 40 MG capsule Take 40 mg by mouth every evening.      . gabapentin (NEURONTIN) 300 MG capsule Take 900 mg by mouth 4 (four) times daily.       Marland Kitchen guaiFENesin (MUCINEX) 600 MG 12 hr tablet Take 600 mg by mouth 2 (two) times daily.       . Menthol-Methyl Salicylate (MUSCLE RUB EX) Apply 1 application topically at bedtime as needed. For pain  To back      . OVER THE COUNTER MEDICATION Place 1  application into both eyes at bedtime. "Eye Gel"      . oxybutynin (DITROPAN) 5 MG tablet Take 5 mg by mouth at bedtime.      Marland Kitchen oxyCODONE-acetaminophen (PERCOCET) 10-325 MG per tablet Take 1 tablet by mouth every 4 (four) hours as needed for pain.  100 tablet  0  . predniSONE (DELTASONE) 5 MG tablet Take 5 mg by mouth daily.       . promethazine (PHENERGAN) 25 MG tablet Take 25 mg by mouth every 6 (six) hours as needed for nausea.       . traMADol (ULTRAM) 50 MG tablet Take 50 mg by mouth every 6 (six) hours as needed for pain.         Assessment: 47yo male c/o acute onset of sharp CP at rest and aggravated with exertion, associated with diaphoresis, palpitations, SOB. Trop neg.  Heparin level therapeutic this am at 0.6.  No bleeding noted.  Goal of Therapy:  Heparin level 0.3-0.7 units/ml Monitor platelets by anticoagulation protocol: Yes   Plan:  Cont heparin drip at 1400 units/hr Repeat 6 hr heparin level to confirm Daily CBC, heparin level   Doris Cheadle, PharmD Clinical Pharmacist Pager: [phone] Phone: [phone] 12/02/2012 11:45 AM

## 2012-12-02 NOTE — Progress Notes (Signed)
ANTICOAGULATION CONSULT NOTE - Follow Up Consult  Pharmacy Consult for heparin Indication: chest pain/ACS  Allergies  Allergen Reactions  . Adhesive (Tape) Other (See Comments)    Skin irriation    Patient Measurements: Height: 5\' 9"  (175.3 cm) Weight: 229 lb 8 oz (104.1 kg) IBW/kg (Calculated) : 70.7 Heparin Dosing Weight: 95 kg  Vital Signs: Temp: 98.2 F (36.8 C) (03/29 1500) Temp src: Oral (03/29 1500) BP: 111/66 mmHg (03/29 1900) Pulse Rate: 61 (03/29 1900)  Labs:  Recent Labs  12/01/12 1707 12/02/12 0353 12/02/12 0859 12/02/12 1107 12/02/12 1713  HGB 14.9  --   --   --   --   HCT 43.6  --   --   --   --   PLT 313  --   --   --   --   HEPARINUNFRC  --   --   --  0.60 0.59  CREATININE 0.99  --   --   --   --   TROPONINI  --  <0.30 <0.30  --   --     Estimated Creatinine Clearance: 109.7 ml/min (by C-G formula based on Cr of 0.99).  Assessment: Pt on heparin gtt for chest pain. Negative enzymes. Heparin level therapeutic (0.59) on 1400 units/hr.  No bleeding noted.  Goal of Therapy:  Heparin level 0.3-0.7 units/ml Monitor platelets by anticoagulation protocol: Yes   Plan:  Cont heparin drip at 1400 units/hr Daily CBC, heparin level  Christoper Fabian, PharmD, BCPS Clinical pharmacist, pager [phone] 12/02/2012 7:32 PM

## 2012-12-02 NOTE — Progress Notes (Signed)
[facility] Progress Note [place] TEAM 1 - Stepdown/ICU TEAM   [name] ZOX:[id number] DOB: 04/13/1966 DOA: 12/01/2012 PCP: Rogelia Boga, MD  Brief narrative: Patient is a 47 y.o. male with a PMHx of idiopathic hypertrophic cardiomyopathy, depression, hypercholesterolemia, reflux disease, hypertension, neurosarcoidosis, palpitations and impaired glucose tolerance with multiple ER visit and admissions in the past for similar chest pain presented to [facility] for evaluation of acute onset chest pain that began on the morning of admission while the patient was sitting at home. Pain is described as a sharp chest pain located over mid chest. The pain is rated as a 10/10 in severity at onset and currently rated 7/10. The pain is notably aggravated by walking, lifting, exertion. It is alleviated by none. has associated diaphoresis, palpitations and shortness of breath. For the pain, the patient tried nothing for pain relief.  The patient has experienced similar symptoms previously. Secondary to his constellation of symptoms, [name] presented to [facility] for further evaluation. Otherwise, the patient denies nausea, near syncope and syncope.    Assessment/Plan: Principal Problem:   Unstable angina - Requested cardiology evaluation - cont Heparin and Nitro  Active Problems:   NEUROSARCOIDOSIS -Stable    IMPAIRED GLUCOSE TOLERANCE -Not on medication for this    HYPERCHOLESTEROLEMIA -Continue statin    DEPRESSION -Continue home meds    HYPERTENSION -Continue atenolol    CARDIOMYOPATHY, IDIOPATHIC HYPERTROPHIC -Outpatient management all of our cardiology    Code Status: Full  Family Communication: None Disposition Plan: Continue to follow in step down unit Consultants: Cardiology  Procedures: None  Antibiotics: None  DVT prophylaxis: Heparin  HPI/Subjective: The patient complains of continued left-sided chest pain-currently is not radiating to his  neck or left arm but was earlier.  Objective: Blood pressure 114/63, pulse 64, temperature 98.2 F (36.8 C), temperature source Oral, resp. rate 19, height 5\' 9"  (1.753 m), weight 104.1 kg (229 lb 8 oz), SpO2 96.00%.  Intake/Output Summary (Last 24 hours) at 12/02/12 1732 Last data filed at 12/02/12 1527  Gross per 24 hour  Intake  461.3 ml  Output   1100 ml  Net -638.7 ml     Exam: General: No acute respiratory distress Lungs: Clear to auscultation bilaterally without wheezes or crackles Cardiovascular: Regular rate and rhythm without murmur gallop or rub normal S1 and S2 Abdomen: Nontender, nondistended, soft, bowel sounds positive, no rebound, no ascites, no appreciable mass Extremities: No significant cyanosis, clubbing, or edema bilateral lower extremities  Data Reviewed: Basic Metabolic Panel:  Recent Labs Lab 12/01/12 1707  NA 137  K 4.5  CL 100  CO2 27  GLUCOSE 96  BUN 13  CREATININE 0.99  CALCIUM 9.5   Liver Function Tests: No results found for this basename: AST, ALT, ALKPHOS, BILITOT, PROT, ALBUMIN,  in the last 168 hours No results found for this basename: LIPASE, AMYLASE,  in the last 168 hours No results found for this basename: AMMONIA,  in the last 168 hours CBC:  Recent Labs Lab 12/01/12 1707  WBC 3.9*  HGB 14.9  HCT 43.6  MCV 85.8  PLT 313   Cardiac Enzymes:  Recent Labs Lab 12/02/12 0353 12/02/12 0859  TROPONINI <0.30 <0.30   BNP (last 3 results) No results found for this basename: PROBNP,  in the last 8760 hours CBG: No results found for this basename: GLUCAP,  in the last 168 hours  Recent Results (from the past 240 hour(s))  MRSA PCR SCREENING  Status: None   Collection Time    12/02/12  4:21 AM      Result Value Range Status   MRSA by PCR NEGATIVE  NEGATIVE Final   Comment:            The GeneXpert MRSA Assay (FDA     approved for NASAL specimens     only), is one component of a     comprehensive MRSA colonization      surveillance program. It is not     intended to diagnose MRSA     infection nor to guide or     monitor treatment for     MRSA infections.     Studies:  Recent x-ray studies have been reviewed in detail by the Attending Physician  Scheduled Meds:  Scheduled Meds: . amitriptyline  25 mg Oral QHS  . aspirin EC  81 mg Oral Daily  . atenolol  25 mg Oral BID  . atorvastatin  40 mg Oral Daily  . buPROPion  300 mg Oral q morning - 10a  . predniSONE  5 mg Oral Daily  . senna  1 tablet Oral BID  . sodium chloride  3 mL Intravenous Q12H  . sodium chloride  3 mL Intravenous Q12H   Continuous Infusions: . heparin 1,400 Units/hr (12/02/12 0434)  . nitroGLYCERIN 10 mcg/min (12/02/12 1527)    Time spent on care of this patient: 35 minutes  [facility]  [facility] Office  [phone] Pager - Text Page per Loretha Stapler as per below:  On-Call/Text Page:      [email or web address]      password [email or web address]  If 7PM-7AM, please contact night-coverage [email or web address] Password [facility] 12/02/2012, 5:32 PM   LOS: 1 day

## 2012-12-03 DIAGNOSIS — D869 Sarcoidosis, unspecified: Secondary | ICD-10-CM

## 2012-12-03 DIAGNOSIS — R079 Chest pain, unspecified: Secondary | ICD-10-CM | POA: Diagnosis not present

## 2012-12-03 DIAGNOSIS — R002 Palpitations: Secondary | ICD-10-CM | POA: Diagnosis not present

## 2012-12-03 DIAGNOSIS — I428 Other cardiomyopathies: Secondary | ICD-10-CM

## 2012-12-03 LAB — CBC
HCT: 41.1 % (ref 39.0–52.0)
Hemoglobin: 13.7 g/dL (ref 13.0–17.0)
MCH: 28.6 pg (ref 26.0–34.0)
MCHC: 33.3 g/dL (ref 30.0–36.0)
MCV: 85.8 fL (ref 78.0–100.0)
Platelets: 223 10*3/uL (ref 150–400)
RBC: 4.79 MIL/uL (ref 4.22–5.81)
RDW: 14.6 % (ref 11.5–15.5)
WBC: 2.7 10*3/uL — ABNORMAL LOW (ref 4.0–10.5)

## 2012-12-03 LAB — HEPARIN LEVEL (UNFRACTIONATED): Heparin Unfractionated: 0.41 IU/mL (ref 0.30–0.70)

## 2012-12-03 NOTE — Progress Notes (Signed)
ANTICOAGULATION CONSULT NOTE - Follow Up Consult  Pharmacy Consult for heparin Indication: chest pain/ACS  Allergies  Allergen Reactions  . Adhesive (Tape) Other (See Comments)    Skin irriation    Patient Measurements: Height: 5\' 9"  (175.3 cm) Weight: 230 lb 13.2 oz (104.7 kg) IBW/kg (Calculated) : 70.7 Heparin Dosing Weight: 95 kg  Vital Signs: Temp: 97.9 F (36.6 C) (03/30 0730) Temp src: Oral (03/30 0730) BP: 114/68 mmHg (03/30 0730) Pulse Rate: 63 (03/30 0730)  Labs:  Recent Labs  12/01/12 1707 12/02/12 0353 12/02/12 0859 12/02/12 1107 12/02/12 1713 12/03/12 0615  HGB 14.9  --   --   --   --  13.7  HCT 43.6  --   --   --   --  41.1  PLT 313  --   --   --   --  PENDING  HEPARINUNFRC  --   --   --  0.60 0.59 0.41  CREATININE 0.99  --   --   --   --   --   TROPONINI  --  <0.30 <0.30  --   --   --     Estimated Creatinine Clearance: 110 ml/min (by C-G formula based on Cr of 0.99).  Assessment: Pt on heparin gtt for chest pain. Negative enzymes. Heparin level therapeutic (0.41) on 1400 units/hr.  No bleeding noted. Hg dropped 1 point today, plts still not back, will watch.  Goal of Therapy:  Heparin level 0.3-0.7 units/ml Monitor platelets by anticoagulation protocol: Yes   Plan:  Cont heparin drip at 1400 units/hr Daily CBC, heparin level   Vania Rea. Darin Engels.D. Clinical Pharmacist Pager [phone] Phone [phone] 12/03/2012 8:39 AM

## 2012-12-03 NOTE — Progress Notes (Signed)
Pt transferred to 3W room 40 via w/c, tele, no IV or O2.  Pt to call wife upon arrival.  Report called to RN.  Salomon Mast, RN

## 2012-12-03 NOTE — Progress Notes (Signed)
   SUBJECTIVE:  He reports palpitations overnight.  This is when he gets his chest pain.  Ruling out.     PHYSICAL EXAM Filed Vitals:   12/03/12 0700 12/03/12 0702 12/03/12 0730 12/03/12 0958  BP: 104/57  114/68 106/69  Pulse: 70 65 63 69  Temp:   97.9 F (36.6 C)   TempSrc:   Oral   Resp: 17 15 22    Height:      Weight:      SpO2: 98% 99% 100%    General:  No distress Lungs:  Clear Heart:  Murmur present Abdomen:  Positive bowel sounds, no rebound no guarding Extremities:  No edema.   LABS: Lab Results  Component Value Date   CKTOTAL 90 12/30/2011   CKMB 2.1 12/30/2011   TROPONINI <0.30 12/02/2012   Results for orders placed during the hospital encounter of 12/01/12 (from the past 24 hour(s))  HEPARIN LEVEL (UNFRACTIONATED)     Status: None   Collection Time    12/02/12 11:07 AM      Result Value Range   Heparin Unfractionated 0.60  0.30 - 0.70 IU/mL  HEPARIN LEVEL (UNFRACTIONATED)     Status: None   Collection Time    12/02/12  5:13 PM      Result Value Range   Heparin Unfractionated 0.59  0.30 - 0.70 IU/mL  HEPARIN LEVEL (UNFRACTIONATED)     Status: None   Collection Time    12/03/12  6:15 AM      Result Value Range   Heparin Unfractionated 0.41  0.30 - 0.70 IU/mL  CBC     Status: Abnormal   Collection Time    12/03/12  6:15 AM      Result Value Range   WBC 2.7 (*) 4.0 - 10.5 K/uL   RBC 4.79  4.22 - 5.81 MIL/uL   Hemoglobin 13.7  13.0 - 17.0 g/dL   HCT 16.1  09.6 - 04.5 %   MCV 85.8  78.0 - 100.0 fL   MCH 28.6  26.0 - 34.0 pg   MCHC 33.3  30.0 - 36.0 g/dL   RDW 40.9  81.1 - 91.4 %   Platelets 223  150 - 400 K/uL    Intake/Output Summary (Last 24 hours) at 12/03/12 1023 Last data filed at 12/03/12 0900  Gross per 24 hour  Intake    748 ml  Output   2100 ml  Net  -1352 ml    EKG:  NSR, rate 56.  No acute ST T wave changes. 12/03/2012  ASSESSMENT AND PLAN:  Precordial chest pain:  Atypical.  No significant arrhythmias despite complaints of  palpitations.  Given the negative stress test last year.  Atypical presentation and no objective evidence of ischemia I will stop IV NTG and I am not planning further stress testing.    HYPERTENSION:  BP is well controlled.  Continue current medications.    CARDIOMYOPATHY, IDIOPATHIC HYPERTROPHIC:  Echo ordered and is pending.   Leukocytopenia:  Can be followed as an outpatient.  This has been evident on multiple labs in the past.     Rollene Rotunda 12/03/2012 10:23 AM

## 2012-12-03 NOTE — Progress Notes (Signed)
[facility] Progress Note [place] TEAM 1 - Stepdown/ICU TEAM   [name] ZOX:[id number] DOB: 12/23/1965 DOA: 12/01/2012 PCP: Rogelia Boga, MD  Brief narrative: Patient is a 47 y.o. male with a PMHx of idiopathic hypertrophic cardiomyopathy, depression, hypercholesterolemia, reflux disease, hypertension, neurosarcoidosis, palpitations and impaired glucose tolerance with multiple ER visit and admissions in the past for similar chest pain presented to [facility] for evaluation of acute onset chest pain that began on the morning of admission while the patient was sitting at home. Pain is described as a sharp chest pain located over mid chest. The pain is rated as a 10/10 in severity at onset and currently rated 7/10. The pain is notably aggravated by walking, lifting, exertion. It is alleviated by none. has associated diaphoresis, palpitations and shortness of breath. For the pain, the patient tried nothing for pain relief.  The patient has experienced similar symptoms previously. Secondary to his constellation of symptoms, [name] presented to [facility] for further evaluation. Otherwise, the patient denies nausea, near syncope and syncope.    Assessment/Plan: Principal Problem:   Unstable angina? Palpitations  - being evaluated by cardiology  Active Problems:   NEUROSARCOIDOSIS -Stable    IMPAIRED GLUCOSE TOLERANCE -Not on medication for this    HYPERCHOLESTEROLEMIA -Continue statin    DEPRESSION -Continue home meds    HYPERTENSION -Continue atenolol    CARDIOMYOPATHY, IDIOPATHIC HYPERTROPHIC -Outpatient management by [facility] cardiology    Code Status: Full  Family Communication: None Disposition Plan: transfer to telemetry Consultants: Cardiology  Procedures: None  Antibiotics: None  DVT prophylaxis: Heparin  HPI/Subjective: The patient complains that chest pain continues. He states today that he has been having palpitations as  well.  Objective: Blood pressure 118/72, pulse 63, temperature 98.1 F (36.7 C), temperature source Oral, resp. rate 17, height 5\' 9"  (1.753 m), weight 104.7 kg (230 lb 13.2 oz), SpO2 92.00%.  Intake/Output Summary (Last 24 hours) at 12/03/12 2018 Last data filed at 12/03/12 1210  Gross per 24 hour  Intake 721.07 ml  Output   1950 ml  Net -1228.93 ml     Exam: General: No acute respiratory distress Lungs: Clear to auscultation bilaterally without wheezes or crackles Chest: no reproducible pain when palpated and with extensive movement of left arm Cardiovascular: Regular rate and rhythm without murmur gallop or rub normal S1 and S2 Abdomen: Nontender, nondistended, soft, bowel sounds positive, no rebound, no ascites, no appreciable mass Extremities: No significant cyanosis, clubbing, or edema bilateral lower extremities  Data Reviewed: Basic Metabolic Panel:  Recent Labs Lab 12/01/12 1707  NA 137  K 4.5  CL 100  CO2 27  GLUCOSE 96  BUN 13  CREATININE 0.99  CALCIUM 9.5   Liver Function Tests: No results found for this basename: AST, ALT, ALKPHOS, BILITOT, PROT, ALBUMIN,  in the last 168 hours No results found for this basename: LIPASE, AMYLASE,  in the last 168 hours No results found for this basename: AMMONIA,  in the last 168 hours CBC:  Recent Labs Lab 12/01/12 1707 12/03/12 0615  WBC 3.9* 2.7*  HGB 14.9 13.7  HCT 43.6 41.1  MCV 85.8 85.8  PLT 313 223   Cardiac Enzymes:  Recent Labs Lab 12/02/12 0353 12/02/12 0859  TROPONINI <0.30 <0.30   BNP (last 3 results) No results found for this basename: PROBNP,  in the last 8760 hours CBG: No results found for this basename: GLUCAP,  in the last 168 hours  Recent Results (from the past  240 hour(s))  MRSA PCR SCREENING     Status: None   Collection Time    12/02/12  4:21 AM      Result Value Range Status   MRSA by PCR NEGATIVE  NEGATIVE Final   Comment:            The GeneXpert MRSA Assay (FDA      approved for NASAL specimens     only), is one component of a     comprehensive MRSA colonization     surveillance program. It is not     intended to diagnose MRSA     infection nor to guide or     monitor treatment for     MRSA infections.     Studies:  Recent x-ray studies have been reviewed in detail by the Attending Physician  Scheduled Meds:  Scheduled Meds: . amitriptyline  25 mg Oral QHS  . aspirin EC  81 mg Oral Daily  . atenolol  25 mg Oral BID  . atorvastatin  40 mg Oral Daily  . azaTHIOprine  50 mg Oral BID  . buPROPion  300 mg Oral q morning - 10a  . cycloSPORINE  1 drop Both Eyes BID  . dicyclomine  10 mg Oral TID AC & HS  . docusate sodium  100 mg Oral BID  . DULoxetine  90 mg Oral Daily  . gabapentin  900 mg Oral QID  . guaiFENesin  600 mg Oral BID  . oxybutynin  5 mg Oral QHS  . pantoprazole  40 mg Oral Daily  . predniSONE  5 mg Oral Daily  . senna  1 tablet Oral BID  . sodium chloride  3 mL Intravenous Q12H  . sodium chloride  3 mL Intravenous Q12H   Continuous Infusions:    Time spent on care of this patient: 25 minutes  [facility]  [facility] Office  [phone] Pager - Text Page per Loretha Stapler as per below:  On-Call/Text Page:      [email or web address]      password [email or web address]  If 7PM-7AM, please contact night-coverage [email or web address] Password [email or web address] 12/03/2012, 8:18 PM   LOS: 2 days

## 2012-12-04 DIAGNOSIS — R079 Chest pain, unspecified: Secondary | ICD-10-CM | POA: Diagnosis not present

## 2012-12-04 DIAGNOSIS — E7800 Pure hypercholesterolemia, unspecified: Secondary | ICD-10-CM | POA: Diagnosis not present

## 2012-12-04 DIAGNOSIS — R002 Palpitations: Secondary | ICD-10-CM | POA: Diagnosis not present

## 2012-12-04 DIAGNOSIS — G894 Chronic pain syndrome: Secondary | ICD-10-CM | POA: Diagnosis not present

## 2012-12-04 DIAGNOSIS — I517 Cardiomegaly: Secondary | ICD-10-CM

## 2012-12-04 LAB — CBC
HCT: 41.8 % (ref 39.0–52.0)
Hemoglobin: 14.1 g/dL (ref 13.0–17.0)
MCH: 28.5 pg (ref 26.0–34.0)
MCHC: 33.7 g/dL (ref 30.0–36.0)
MCV: 84.4 fL (ref 78.0–100.0)
Platelets: 255 10*3/uL (ref 150–400)
RBC: 4.95 MIL/uL (ref 4.22–5.81)
RDW: 14.2 % (ref 11.5–15.5)
WBC: 2.8 10*3/uL — ABNORMAL LOW (ref 4.0–10.5)

## 2012-12-04 LAB — HEPARIN LEVEL (UNFRACTIONATED): Heparin Unfractionated: <0.1 IU/mL — ABNORMAL LOW (ref 0.30–0.70)

## 2012-12-04 MED ORDER — HEPARIN SODIUM (PORCINE) 5000 UNIT/ML IJ SOLN
5000.0000 [IU] | Freq: Three times a day (TID) | INTRAMUSCULAR | Status: DC
  Administered 2012-12-04 – 2012-12-05 (×2): 5000 [IU] via SUBCUTANEOUS
  Filled 2012-12-04 (×5): qty 1

## 2012-12-04 MED ORDER — ATENOLOL 50 MG PO TABS
50.0000 mg | ORAL_TABLET | Freq: Two times a day (BID) | ORAL | Status: DC
  Filled 2012-12-04: qty 1

## 2012-12-04 MED ORDER — ATENOLOL 25 MG PO TABS
25.0000 mg | ORAL_TABLET | Freq: Two times a day (BID) | ORAL | Status: DC
  Administered 2012-12-04 – 2012-12-05 (×2): 25 mg via ORAL
  Filled 2012-12-04 (×3): qty 1

## 2012-12-04 NOTE — Progress Notes (Signed)
  Echocardiogram 2D Echocardiogram has been performed.  Cathie Beams 12/04/2012, 4:06 PM

## 2012-12-04 NOTE — Progress Notes (Signed)
[facility] Progress Note [place] TEAM 1 - Stepdown/ICU TEAM   PRECILIANO CASTELL WUX:[id number] DOB: 02/19/1966 DOA: 12/01/2012 PCP: Rogelia Boga, MD  Brief narrative: Patient is a 47 y.o. male with a PMHx of idiopathic hypertrophic cardiomyopathy, depression, hypercholesterolemia, reflux disease, hypertension, neurosarcoidosis, palpitations and impaired glucose tolerance with multiple ER visit and admissions in the past for similar chest pain presented to [facility] for evaluation of acute onset chest pain that began on the morning of admission while the patient was sitting at home. Pain was described as a sharp chest pain located over mid chest. The pain was rated as a 10/10 in severity at onset. The pain was notably aggravated by walking, lifting, exertion. It was alleviated by nothing. He reported associated diaphoresis, palpitations and shortness of breath. For the pain, the patient tried nothing for pain relief.   Assessment/Plan:  Unstable angina? Palpitations  -Cards has been following -Had negative stress test last year and given atypical presentation and no objective evidence of ischemia IV NTG was dc'd and Cards plans no further stress testing -ECHO ordered today by cards and results pending  Leukopenia -Chronic issue and if warranted further work up can be accomplished in the OP setting by his PCP  NEUROSARCOIDOSIS -Stable  PRE-DM -A1c does not yet meet criteria for true DM - cont to follow as oupt  HYPERCHOLESTEROLEMIA -Continue statin  DEPRESSION -Continue home meds  HYPERTENSION -Continue atenolol  CARDIOMYOPATHY, IDIOPATHIC HYPERTROPHIC -Outpatient management by [facility] -ECHO pending - probalbe d/c home if no acute findings on echo  Code Status: Full  Family Communication: No family present at time of exam Disposition Plan: Telemetry - probable d/c once echo results available Consultants: Cardiology -  [place]  Procedures: None  Antibiotics: None  DVT prophylaxis: Heparin SQ  HPI/Subjective: He continues to c/o "chest pressure" that radiates to left arm w/o any associated sx's. Does not appear to be in any distress.  Objective: Blood pressure 115/58, pulse 79, temperature 98.6 F (37 C), temperature source Oral, resp. rate 16, height 5\' 9"  (1.753 m), weight 104.7 kg (230 lb 13.2 oz), SpO2 96.00%.  Intake/Output Summary (Last 24 hours) at 12/04/12 1919 Last data filed at 12/04/12 1318  Gross per 24 hour  Intake    480 ml  Output      0 ml  Net    480 ml    Exam: General: No acute respiratory distress Lungs: Clear to auscultation bilaterally without wheezes or crackles Chest: no reproducible pain when palpated and with extensive movement of left arm Cardiovascular: Regular rate and rhythm without murmur gallop or rub normal S1 and S2 Abdomen: Nontender, nondistended, soft, bowel sounds positive, no rebound, no ascites, no appreciable mass Extremities: No significant cyanosis, clubbing, or edema bilateral lower extremities  Data Reviewed: Basic Metabolic Panel:  Recent Labs Lab 12/01/12 1707  NA 137  K 4.5  CL 100  CO2 27  GLUCOSE 96  BUN 13  CREATININE 0.99  CALCIUM 9.5   CBC:  Recent Labs Lab 12/01/12 1707 12/03/12 0615 12/04/12 0602  WBC 3.9* 2.7* 2.8*  HGB 14.9 13.7 14.1  HCT 43.6 41.1 41.8  MCV 85.8 85.8 84.4  PLT 313 223 255   Cardiac Enzymes:  Recent Labs Lab 12/02/12 0353 12/02/12 0859  TROPONINI <0.30 <0.30    Recent Results (from the past 240 hour(s))  MRSA PCR SCREENING     Status: None   Collection Time    12/02/12  4:21 AM  Result Value Range Status   MRSA by PCR NEGATIVE  NEGATIVE Final   Comment:            The GeneXpert MRSA Assay (FDA     approved for NASAL specimens     only), is one component of a     comprehensive MRSA colonization     surveillance program. It is not     intended to diagnose MRSA     infection  nor to guide or     monitor treatment for     MRSA infections.     Studies:  Recent x-ray studies have been reviewed in detail by the Attending Physician  Scheduled Meds:  Scheduled Meds: . amitriptyline  25 mg Oral QHS  . aspirin EC  81 mg Oral Daily  . atenolol  50 mg Oral BID  . atorvastatin  40 mg Oral Daily  . azaTHIOprine  50 mg Oral BID  . buPROPion  300 mg Oral q morning - 10a  . cycloSPORINE  1 drop Both Eyes BID  . dicyclomine  10 mg Oral TID AC & HS  . docusate sodium  100 mg Oral BID  . DULoxetine  90 mg Oral Daily  . gabapentin  900 mg Oral QID  . guaiFENesin  600 mg Oral BID  . heparin subcutaneous  5,000 Units Subcutaneous Q8H  . oxybutynin  5 mg Oral QHS  . pantoprazole  40 mg Oral Daily  . predniSONE  5 mg Oral Daily  . senna  1 tablet Oral BID  . sodium chloride  3 mL Intravenous Q12H  . sodium chloride  3 mL Intravenous Q12H   Continuous Infusions:    Time spent on care of this patient: 25 minutes  [facility]  [facility] Office  [phone] Pager - Text Page per Loretha Stapler as per below:  On-Call/Text Page:      [email or web address]      password [email or web address]  If 7PM-7AM, please contact night-coverage [email or web address] Password [email or web address] 12/04/2012, 7:19 PM   LOS: 3 days   I have personally examined this patient and reviewed the entire database. I have reviewed the above note, made any necessary editorial changes, and agree with its content.  Lonia Blood, MD [facility]

## 2012-12-05 ENCOUNTER — Inpatient Hospital Stay (HOSPITAL_COMMUNITY): Payer: Medicare Other

## 2012-12-05 DIAGNOSIS — R079 Chest pain, unspecified: Secondary | ICD-10-CM | POA: Diagnosis not present

## 2012-12-05 DIAGNOSIS — M5030 Other cervical disc degeneration, unspecified cervical region: Secondary | ICD-10-CM | POA: Diagnosis not present

## 2012-12-05 LAB — CBC
HCT: 41.5 % (ref 39.0–52.0)
Hemoglobin: 14.5 g/dL (ref 13.0–17.0)
MCH: 28.9 pg (ref 26.0–34.0)
MCHC: 34.9 g/dL (ref 30.0–36.0)
MCV: 82.8 fL (ref 78.0–100.0)
Platelets: 257 10*3/uL (ref 150–400)
RBC: 5.01 MIL/uL (ref 4.22–5.81)
RDW: 13.9 % (ref 11.5–15.5)
WBC: 3.4 10*3/uL — ABNORMAL LOW (ref 4.0–10.5)

## 2012-12-05 MED ORDER — OXYCODONE HCL 5 MG PO TABS: 5.0000 mg | ORAL_TABLET | ORAL | Status: DC | PRN

## 2012-12-05 NOTE — Progress Notes (Signed)
    SUBJECTIVE:  He reports palpitations again overnight.     PHYSICAL EXAM Filed Vitals:   12/04/12 1317 12/04/12 2039 12/05/12 0500 12/05/12 1002  BP: 115/58 110/71 99/65 100/60  Pulse: 79 74 74 82  Temp: 98.6 F (37 C) 98.9 F (37.2 C) 97.8 F (36.6 C)   TempSrc: Oral Oral Oral   Resp: 16 16 16    Height:      Weight:      SpO2: 96% 94% 96%    General:  No distress Lungs:  Clear Heart:  Murmur present Abdomen:  Positive bowel sounds, no rebound no guarding Extremities:  No edema.   LABS: Lab Results  Component Value Date   CKTOTAL 90 12/30/2011   CKMB 2.1 12/30/2011   TROPONINI <0.30 12/02/2012   Results for orders placed during the hospital encounter of 12/01/12 (from the past 24 hour(s))  CBC     Status: Abnormal   Collection Time    12/05/12  6:03 AM      Result Value Range   WBC 3.4 (*) 4.0 - 10.5 K/uL   RBC 5.01  4.22 - 5.81 MIL/uL   Hemoglobin 14.5  13.0 - 17.0 g/dL   HCT 16.1  09.6 - 04.5 %   MCV 82.8  78.0 - 100.0 fL   MCH 28.9  26.0 - 34.0 pg   MCHC 34.9  30.0 - 36.0 g/dL   RDW 40.9  81.1 - 91.4 %   Platelets 257  150 - 400 K/uL    Intake/Output Summary (Last 24 hours) at 12/05/12 1029 Last data filed at 12/04/12 1318  Gross per 24 hour  Intake    240 ml  Output      0 ml  Net    240 ml    EKG:  NSR, rate 56.  No acute ST T wave changes. 12/05/2012  ASSESSMENT AND PLAN:  Precordial chest pain:  Atypical.  No objective evidence of ischemia.   Given the negative stress test last year, atypical presentation and no objective evidence of ischemia no further testing is planned.  Of note he felt palpitations.  I reviewed telemetry and he had one couplet.  No further treatment is needed and I discussed this with him.   HYPERTENSION:  BP is well controlled.  Continue current medications.    CARDIOMYOPATHY, IDIOPATHIC HYPERTROPHIC:  This is mild by echo this admission no further testing is needed.  Follow with primary provider.      Fayrene Fearing  [facility] 12/05/2012 10:29 AM

## 2012-12-19 DIAGNOSIS — R002 Palpitations: Secondary | ICD-10-CM | POA: Diagnosis not present

## 2012-12-19 DIAGNOSIS — G4733 Obstructive sleep apnea (adult) (pediatric): Secondary | ICD-10-CM | POA: Diagnosis not present

## 2012-12-19 DIAGNOSIS — D869 Sarcoidosis, unspecified: Secondary | ICD-10-CM | POA: Diagnosis not present

## 2012-12-19 DIAGNOSIS — R0602 Shortness of breath: Secondary | ICD-10-CM | POA: Diagnosis not present

## 2012-12-19 NOTE — Discharge Summary (Signed)
Physician Discharge Summary  OTHAR CURTO ZOX:[id number] DOB: 10/28/1965 DOA: 12/01/2012  PCP: Rogelia Boga, MD  Admit date: 12/01/2012 Discharge date: 12/19/2012  Time spent: >45 minutes  Discharge Diagnoses:  Principal Problem:   Unstable angina Active Problems:   NEUROSARCOIDOSIS   IMPAIRED GLUCOSE TOLERANCE   HYPERCHOLESTEROLEMIA   DEPRESSION   SYNDROME, CHRONIC PAIN   HYPERTENSION   CARDIOMYOPATHY, IDIOPATHIC HYPERTROPHIC   Discharge Condition: stable  Diet recommendation: hearth healthy  Filed Weights   12/02/12 0423 12/03/12 0435  Weight: 104.1 kg (229 lb 8 oz) 104.7 kg (230 lb 13.2 oz)    History of present illness:  Patient is a 47 y.o. male with a PMHx of idiopathic hypertrophic cardiomyopathy, depression, hypercholesterolemia, reflux disease, hypertension, neurosarcoidosis, palpitations and impaired glucose tolerance with multiple ER visit and admissions in the past for similar chest pain presented to [facility] for evaluation of acute onset chest pain that began on the morning of admission while the patient was sitting at home. Pain was described as a sharp chest pain located over mid chest. The pain was rated as a 10/10 in severity at onset. The pain was notably aggravated by walking, lifting, exertion. It was alleviated by nothing. He reported associated diaphoresis, palpitations and shortness of breath. For the pain, the patient tried nothing for pain relief.      Hospital Course:  Unstable angina? Palpitations  -Cards consulted  -Had negative stress test last year and given atypical presentation and no objective evidence of ischemia IV NTG was dc'd and Cards plans no further stress testing  -ECHO revealed only mild hypertrophic cardiomyopathy - per cardiology no further cardiac testing is required - I did request a CT scan of  The C-spine to ensure that the pain was not coming from nerve root compression- this was negative for such pathology- see report  below  Leukopenia  -Chronic issue and if warranted further work up can be accomplished in the OP setting by his PCP   NEUROSARCOIDOSIS  -Stable   PRE-DM  -A1c does not yet meet criteria for true DM - cont to follow as oupt   HYPERCHOLESTEROLEMIA  -Continue statin   DEPRESSION  -Continue home meds   HYPERTENSION  -Continue atenolol   CARDIOMYOPATHY, IDIOPATHIC HYPERTROPHIC  -Outpatient management by [facility]  -ECHO pending - probalbe d/c home if no acute findings on echo      Consultations:  cardiology  Discharge Exam: Filed Vitals:   12/04/12 2039 12/05/12 0500 12/05/12 1002 12/05/12 1330  BP: 110/71 99/65 100/60 113/71  Pulse: 74 74 82 72  Temp: 98.9 F (37.2 C) 97.8 F (36.6 C)  98.1 F (36.7 C)  TempSrc: Oral Oral    Resp: 16 16  17   Height:      Weight:      SpO2: 94% 96%  94%    General: AAO x 3, no distress Cardiovascular: RRR, no murmurs Respiratory: CTA b/l  Discharge Instructions      Discharge Orders   Future Appointments Provider Department Dept Phone   01/10/2013 8:30 AM Gordy Savers, MD [facility] [phone]   Future Orders Complete By Expires     Diet - low sodium heart healthy  As directed     Increase activity slowly  As directed         Medication List    TAKE these medications       albuterol 108 (90 BASE) MCG/ACT inhaler  Commonly known as:  PROVENTIL HFA;VENTOLIN HFA  Inhale 2 puffs into the lungs every 6 (six) hours as needed for wheezing or shortness of breath.     amitriptyline 25 MG tablet  Commonly known as:  ELAVIL  Take 1 tablet (25 mg total) by mouth at bedtime.     ARTIFICIAL TEAR OP  Place 1 drop into both eyes 2 (two) times daily. Preservative free     atenolol 25 MG tablet  Commonly known as:  TENORMIN  Take 25 mg by mouth 2 (two) times daily.     atorvastatin 40 MG tablet  Commonly known as:  LIPITOR  Take 40 mg by mouth daily.     azaTHIOprine 50 MG tablet   Commonly known as:  IMURAN  Take 50 mg by mouth 2 (two) times daily.     buPROPion 300 MG 24 hr tablet  Commonly known as:  WELLBUTRIN XL  Take 300 mg by mouth every morning.     COBAL-1000 1000 MCG/ML injection  Generic drug:  cyanocobalamin  Inject 1,000 mcg into the muscle every 30 (thirty) days.     cycloSPORINE 0.05 % ophthalmic emulsion  Commonly known as:  RESTASIS  Place 1 drop into both eyes 2 (two) times daily.     diazepam 5 MG tablet  Commonly known as:  VALIUM  Take 5 mg by mouth 2 (two) times daily as needed. For anxiety     dicyclomine 10 MG capsule  Commonly known as:  BENTYL  Take 10 mg by mouth 4 (four) times daily.     docusate sodium 100 MG capsule  Commonly known as:  COLACE  Take 100 mg by mouth 2 (two) times daily.     DULoxetine 30 MG capsule  Commonly known as:  CYMBALTA  Take 30 mg by mouth daily. Along with 60mg  to=90mg      DULoxetine 60 MG capsule  Commonly known as:  CYMBALTA  Take 60 mg by mouth daily. Along with 30mg  to =90mg      esomeprazole 40 MG capsule  Commonly known as:  NEXIUM  Take 40 mg by mouth every evening.     gabapentin 300 MG capsule  Commonly known as:  NEURONTIN  Take 900 mg by mouth 4 (four) times daily.     guaiFENesin 600 MG 12 hr tablet  Commonly known as:  MUCINEX  Take 600 mg by mouth 2 (two) times daily.     MUSCLE RUB EX  Apply 1 application topically at bedtime as needed. For pain  To back     OVER THE COUNTER MEDICATION     oxybutynin 5 MG tablet  Commonly known as:  DITROPAN  Take 5 mg by mouth at bedtime.        oxyCODONE-acetaminophen 10-325 MG per tablet  Commonly known as:  PERCOCET  Take 1 tablet by mouth every 4 (four) hours as needed for pain.     predniSONE 5 MG tablet  Commonly known as:  DELTASONE  Take 5 mg by mouth daily.     promethazine 25 MG tablet  Commonly known as:  PHENERGAN  Take 25 mg by mouth every 6 (six) hours as needed for nausea.     traMADol 50 MG tablet   Commonly known as:  ULTRAM  Take 50 mg by mouth every 6 (six) hours as needed for pain.          The results of significant diagnostics from this hospitalization (including imaging, microbiology, ancillary and laboratory) are listed below for reference.  Significant Diagnostic Studies: Dg Chest 2 View  12/01/2012  *RADIOLOGY REPORT*  Clinical Data: Chest pain.  History of hypertension.  CHEST - 2 VIEW  Comparison: 11/10/2012 and chest CTA dated 09/09/2012.  Findings: Normal sized heart.  Stable mild prominence of the right hilum, with associated adenopathy on the previous CT.  The lungs remain clear with stable mild prominence of the interstitial markings.  There is some diaphragmatic flattening on the lateral view.  Minimal thoracic spine degenerative changes.  IMPRESSION:  1.  No acute abnormality. 2.  Stable mild right hilar adenopathy. 3.  Stable mild changes of COPD.   Original Report Authenticated By: Beckie Salts, M.D.    Ct Cervical Spine Wo Contrast  12/05/2012  *RADIOLOGY REPORT*  Clinical Data: Neck pain.  Left shoulder and arm pain.  CT CERVICAL SPINE WITHOUT CONTRAST  Technique:  Multidetector CT imaging of the cervical spine was performed. Multiplanar CT image reconstructions were also generated.  Comparison: 07/16/2011  Findings: Anterior plates and screws are seen at C3, C4, and C5 with interposed tubular bone plugs for anterior discectomy and fusion.  Definitive solid bony fusion across the disc space is not yet present. Slight anterolisthesis at C5-6.  Hardware and bone plugs are stable in position. No evidence of breakage or loosening of the hardware.  No acute fracture.  No destructive bone lesion. Craniocervical junction is within normal limits.  Normal appearing thyroid gland.  C2-3:  Unremarkable  C3-4:  Widely patent.  C4-5:  Widely patent.  C5-6:  Mild disc space narrowing.  No central or foraminal stenosis.  C6-7:  Unremarkable.  C7-T1:  Unremarkable.  IMPRESSION: Anterior  discectomy and fusion at C3-C5 as described.  Stable alignment.  Widely patent canal and foramina.  Otherwise, no evidence of disc herniation or impingement within the cervical spine  No acute bony injury.   Original Report Authenticated By: Jolaine Click, M.D.     Microbiology: No results found for this or any previous visit (from the past 240 hour(s)).   Labs: Basic Metabolic Panel: No results found for this basename: NA, K, CL, CO2, GLUCOSE, BUN, CREATININE, CALCIUM, MG, PHOS,  in the last 168 hours Liver Function Tests: No results found for this basename: AST, ALT, ALKPHOS, BILITOT, PROT, ALBUMIN,  in the last 168 hours No results found for this basename: LIPASE, AMYLASE,  in the last 168 hours No results found for this basename: AMMONIA,  in the last 168 hours CBC: No results found for this basename: WBC, NEUTROABS, HGB, HCT, MCV, PLT,  in the last 168 hours Cardiac Enzymes: No results found for this basename: CKTOTAL, CKMB, CKMBINDEX, TROPONINI,  in the last 168 hours BNP: BNP (last 3 results) No results found for this basename: PROBNP,  in the last 8760 hours CBG: No results found for this basename: GLUCAP,  in the last 168 hours     Signed:  Kemaya Dorner  [facility] 12/19/2012, 9:13 PM

## 2012-12-25 DIAGNOSIS — G9389 Other specified disorders of brain: Secondary | ICD-10-CM | POA: Diagnosis not present

## 2012-12-25 DIAGNOSIS — M5030 Other cervical disc degeneration, unspecified cervical region: Secondary | ICD-10-CM | POA: Diagnosis not present

## 2012-12-26 DIAGNOSIS — G4733 Obstructive sleep apnea (adult) (pediatric): Secondary | ICD-10-CM | POA: Diagnosis not present

## 2012-12-26 DIAGNOSIS — D869 Sarcoidosis, unspecified: Secondary | ICD-10-CM | POA: Diagnosis not present

## 2012-12-26 DIAGNOSIS — R0602 Shortness of breath: Secondary | ICD-10-CM | POA: Diagnosis not present

## 2012-12-26 DIAGNOSIS — R002 Palpitations: Secondary | ICD-10-CM | POA: Diagnosis not present

## 2012-12-29 DIAGNOSIS — M545 Low back pain, unspecified: Secondary | ICD-10-CM | POA: Diagnosis not present

## 2013-01-03 ENCOUNTER — Ambulatory Visit (INDEPENDENT_AMBULATORY_CARE_PROVIDER_SITE_OTHER): Payer: Medicare Other | Admitting: Physician Assistant

## 2013-01-03 ENCOUNTER — Encounter: Payer: Self-pay | Admitting: Physician Assistant

## 2013-01-03 VITALS — BP 112/72 | HR 70 | Ht 69.0 in | Wt 233.8 lb

## 2013-01-03 DIAGNOSIS — R079 Chest pain, unspecified: Secondary | ICD-10-CM | POA: Diagnosis not present

## 2013-01-03 DIAGNOSIS — E7800 Pure hypercholesterolemia, unspecified: Secondary | ICD-10-CM | POA: Diagnosis not present

## 2013-01-03 DIAGNOSIS — I10 Essential (primary) hypertension: Secondary | ICD-10-CM | POA: Diagnosis not present

## 2013-01-03 DIAGNOSIS — D869 Sarcoidosis, unspecified: Secondary | ICD-10-CM

## 2013-01-03 DIAGNOSIS — I428 Other cardiomyopathies: Secondary | ICD-10-CM

## 2013-01-03 NOTE — Patient Instructions (Signed)
Schedule follow up with Dr. Olga Millers in 1 year.  You will receive a reminder in the mail 2-3 months prior.

## 2013-01-03 NOTE — Progress Notes (Signed)
[address], [address] [place], [place]  [address] Phone: [phone] Fax:  [phone]  Date:  01/03/2013   ID:  [name], DOB 01/16/1966, MRN [id number]  PCP:  Rogelia Boga, MD  Primary Cardiologist:  Dr. Olga Millers    History of Present Illness: [name] is a 47 y.o. male who returns for f/u after a recent admission to the hospital.  He has been followed in the past at [facility] for pulmonary and neurosarcoidosis.  He has a hx of idiopathic hypertrophic cardiomyopathy, depression, HL, GERD, HTN, and glucose intolerance. He has a remote hx of cardiac cath in 1998.  He was seen for chest pain by cardiology in 12/2011. Myoview 12/29/11: No ischemia, EF 66%.  He was admitted 3/28-4/1 with chest pain. He was again seen by cardiology. Symptoms were felt to be atypical for ischemia. Cardiac markers were negative. D-dimer was negative. Echocardiogram 12/01/12: Mild focal basal hypertrophy the septum, EF 55-60%, grade 1 diastolic dysfunction, mild LAE. Patient had palpitations. He had one ventricular couplet on telemetry. No further cardiac workup was recommended.  Since d/c, he has occasional chest pains.  Also notes some pain in his left neck that radiates down his arm. Symptoms are improved.  No significant dyspnea.  No orthopnea, PND, edema.  No syncope.  Palpitations are stable.    Labs (3/14):  K 4.5, Cr 0.99, ALT 15, Hgb 14.1, DDimer < 0.27, TSH 2.738,   Wt Readings from Last 3 Encounters:  01/03/13 233 lb 12.8 oz (106.051 kg)  12/03/12 230 lb 13.2 oz (104.7 kg)  09/09/12 225 lb (102.059 kg)     Past Medical History  Diagnosis Date  . B12 DEFICIENCY 03/06/2007  . CARDIOMYOPATHY, IDIOPATHIC HYPERTROPHIC 03/06/2007  . DEPRESSION 03/06/2007  . GERD 06/01/2007  . HYPERCHOLESTEROLEMIA 03/06/2007  . HYPERLIPIDEMIA 11/08/2008  . IMPAIRED GLUCOSE TOLERANCE 11/08/2008  . NEPHROLITHIASIS, HX OF 04/07/2010  . 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  OSTEOARTHRITIS 06/01/2007  . Palpitations 10/23/2007  . SYNDROME, CHRONIC PAIN 03/06/2007  . Shortness of breath   . Cancer     prostate ca  . Prostate cancer   . HYPERTENSION 03/06/2007    Dr. Amador Cunas  . Pneumonia     hx of  . Kidney stones     hx of , had surgery for removal  . Dysrhythmia     sees Dr. Jens Som for "palpitations"  . DVT (deep venous thrombosis)   . Anginal pain   . Anxiety   . SLEEP APNEA 03/06/2007    uses vpap, last sleep study 2011, Dr. Rhona Raider, at [facility]    Current Outpatient Prescriptions  Medication Sig Dispense Refill  . albuterol (PROVENTIL HFA;VENTOLIN HFA) 108 (90 BASE) MCG/ACT inhaler Inhale 2 puffs into the lungs every 6 (six) hours as needed for wheezing or shortness of breath.       Marland Kitchen amitriptyline (ELAVIL) 25 MG tablet Take 1 tablet (25 mg total) by mouth at bedtime.  30 tablet  2  . ARTIFICIAL TEAR OP Place 1 drop into both eyes 2 (two) times daily. Preservative free      . atenolol (TENORMIN) 25 MG tablet Take 25 mg by mouth 2 (two) times daily.      Marland Kitchen atorvastatin (LIPITOR) 40 MG tablet Take 40 mg by mouth daily.      Marland Kitchen azaTHIOprine (IMURAN) 50 MG tablet Take 50 mg by mouth 2 (two) times daily.       Marland Kitchen buPROPion (WELLBUTRIN XL)  300 MG 24 hr tablet Take 300 mg by mouth every morning.      . cyanocobalamin (COBAL-1000) 1000 MCG/ML injection Inject 1,000 mcg into the muscle every 30 (thirty) days.       . cycloSPORINE (RESTASIS) 0.05 % ophthalmic emulsion Place 1 drop into both eyes 2 (two) times daily.       . diazepam (VALIUM) 5 MG tablet Take 5 mg by mouth 2 (two) times daily as needed. For anxiety      . dicyclomine (BENTYL) 10 MG capsule Take 10 mg by mouth 4 (four) times daily.      Marland Kitchen docusate sodium (COLACE) 100 MG capsule Take 100 mg by mouth 2 (two) times daily.      . DULoxetine (CYMBALTA) 30 MG capsule Take 30 mg by mouth daily. Along with 60mg  to=90mg       . DULoxetine (CYMBALTA) 60 MG capsule Take 60 mg by mouth daily. Along with  30mg  to =90mg       . esomeprazole (NEXIUM) 40 MG capsule Take 40 mg by mouth every evening.      . gabapentin (NEURONTIN) 300 MG capsule Take 900 mg by mouth 4 (four) times daily.       Marland Kitchen guaiFENesin (MUCINEX) 600 MG 12 hr tablet Take 600 mg by mouth 2 (two) times daily.       . Menthol-Methyl Salicylate (MUSCLE RUB EX) Apply 1 application topically at bedtime as needed. For pain  To back      . OVER THE COUNTER MEDICATION Place 1 application into both eyes at bedtime. "Eye Gel"      . oxybutynin (DITROPAN) 5 MG tablet Take 5 mg by mouth at bedtime.      Marland Kitchen oxyCODONE (OXY IR/ROXICODONE) 5 MG immediate release tablet Take 1 tablet (5 mg total) by mouth every 4 (four) hours as needed for pain.  30 tablet  0  . oxyCODONE-acetaminophen (PERCOCET) 10-325 MG per tablet Take 1 tablet by mouth every 4 (four) hours as needed for pain.  100 tablet  0  . predniSONE (DELTASONE) 5 MG tablet Take 5 mg by mouth daily.       . promethazine (PHENERGAN) 25 MG tablet Take 25 mg by mouth every 6 (six) hours as needed for nausea.       . traMADol (ULTRAM) 50 MG tablet Take 50 mg by mouth every 6 (six) hours as needed for pain.        No current facility-administered medications for this visit.    Allergies:    Allergies  Allergen Reactions  . Adhesive (Tape) Other (See Comments)    Skin irriation    Social History:  The patient  reports that he quit smoking about 29 years ago. He has never used smokeless tobacco. He reports that he does not drink alcohol or use illicit drugs.   ROS:  Please see the history of present illness.      All other systems reviewed and negative.   PHYSICAL EXAM: VS:  BP 112/72  Pulse 70  Ht 5\' 9"  (1.753 m)  Wt 233 lb 12.8 oz (106.051 kg)  BMI 34.51 kg/m2 Well nourished, well developed, in no acute distress HEENT: normal Neck: no JVD Cardiac:  normal S1, S2; RRR; no murmur Lungs:  clear to auscultation bilaterally, no wheezing, rhonchi or rales Abd: soft, nontender, no  hepatomegaly Ext: no edema Skin: warm and dry Neuro:  CNs 2-12 intact, no focal abnormalities noted  EKG:  NSR, HR 70, normal axis,  no acute changes     ASSESSMENT AND PLAN:  1. Chest Pain:  No objective evidence of ischemia.  No further cardiac workup planned.  If pain continues, could consider referral to GI to investigate potential GI causes. 2. Hypertrophic Cardiomyopathy:  Mild focal basal septal hypertrophy by recent echo.   3. Hypertension:  Controlled.  Continue current therapy.  4. Hyperlipidemia:  Continue statin. 5. Palpitations:  Likely symptomatic PVCs.  Continue beta blocker.   6. Sarcoid:  Followed at [facility]. 7. Disposition:  F/u with Dr. Olga Millers in 1 year.   Signed, Tereso Newcomer, PA-C  4:20 PM 01/03/2013

## 2013-01-10 ENCOUNTER — Other Ambulatory Visit: Payer: Self-pay | Admitting: Internal Medicine

## 2013-01-10 ENCOUNTER — Ambulatory Visit: Payer: Medicare Other | Admitting: Internal Medicine

## 2013-01-11 ENCOUNTER — Ambulatory Visit: Payer: Self-pay | Admitting: Internal Medicine

## 2013-01-11 DIAGNOSIS — Z0289 Encounter for other administrative examinations: Secondary | ICD-10-CM

## 2013-01-24 ENCOUNTER — Other Ambulatory Visit: Payer: Self-pay | Admitting: Internal Medicine

## 2013-01-25 ENCOUNTER — Encounter: Payer: Self-pay | Admitting: Internal Medicine

## 2013-01-25 ENCOUNTER — Ambulatory Visit (INDEPENDENT_AMBULATORY_CARE_PROVIDER_SITE_OTHER): Payer: Medicare Other | Admitting: Internal Medicine

## 2013-01-25 VITALS — BP 140/90 | Temp 99.0°F | Wt 232.0 lb

## 2013-01-25 DIAGNOSIS — D869 Sarcoidosis, unspecified: Secondary | ICD-10-CM

## 2013-01-25 DIAGNOSIS — I10 Essential (primary) hypertension: Secondary | ICD-10-CM

## 2013-01-25 DIAGNOSIS — E538 Deficiency of other specified B group vitamins: Secondary | ICD-10-CM | POA: Diagnosis not present

## 2013-01-25 DIAGNOSIS — G894 Chronic pain syndrome: Secondary | ICD-10-CM

## 2013-01-25 DIAGNOSIS — I428 Other cardiomyopathies: Secondary | ICD-10-CM | POA: Diagnosis not present

## 2013-01-25 DIAGNOSIS — F329 Major depressive disorder, single episode, unspecified: Secondary | ICD-10-CM | POA: Diagnosis not present

## 2013-01-25 NOTE — Progress Notes (Signed)
Subjective:    Patient ID: [name], male    DOB: 08-Mar-1966, 47 y.o.   MRN: [id number]  HPI  47 year old patient who is seen today for followup. He has a history of dyslipidemia hypertension and chronic pain. He is followed by cardiology due 2 hypertrophic cardiomyopathy. He has exogenous obesity. He has done quite well over the past few months. His blood pressure is well-controlled today. He also has a history of B12 deficiency. 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  Past Medical History  Diagnosis Date  . B12 DEFICIENCY 03/06/2007  . CARDIOMYOPATHY, IDIOPATHIC HYPERTROPHIC 03/06/2007  . DEPRESSION 03/06/2007  . GERD 06/01/2007  . HYPERCHOLESTEROLEMIA 03/06/2007  . HYPERLIPIDEMIA 11/08/2008  . IMPAIRED GLUCOSE TOLERANCE 11/08/2008  . NEPHROLITHIASIS, HX OF 04/07/2010  . 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 OSTEOARTHRITIS 06/01/2007  . Palpitations 10/23/2007  . SYNDROME, CHRONIC PAIN 03/06/2007  . Shortness of breath   . Cancer     prostate ca  . Prostate cancer   . HYPERTENSION 03/06/2007    Dr. Amador Cunas  . Pneumonia     hx of  . Kidney stones     hx of , had surgery for removal  . Dysrhythmia     sees Dr. Jens Som for "palpitations"  . DVT (deep venous thrombosis)   . Anginal pain   . Anxiety   . SLEEP APNEA 03/06/2007    uses vpap, last sleep study 2011, Dr. Rhona Raider, at [facility]    History   Social History  . Marital Status: Married    Spouse Name: N/A    Number of Children: N/A  . 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 Smoking status: Former Smoker    Quit date: 09/07/1983  . Smokeless tobacco: Never Used  . Alcohol Use: No  . Drug Use: No  . Sexually Active: Yes   Other Topics Concern  . Not on file   Social History Narrative  . No narrative on file    Past Surgical History  Procedure Laterality Date  . Decompression and fusion      cervicothoracic  . Partial nephrectomy    . Cardiac  catheterization    . Lumbar fusion    . Prostate surgery      robotic prostatectomy  . Penile prosthesis implant    . Tonsillectomy    . Leg skin lesion  biopsy / excision      left leg    Family History  Problem Relation Age of Onset  . Heart disease Neg Hx     family hx CHF  . Kidney disease Neg Hx     family hx   . Cancer Neg Hx     family hx prostate    Allergies  Allergen Reactions  . Adhesive (Tape) Other (See Comments)    Skin irriation    Current Outpatient Prescriptions on File Prior to Visit  Medication Sig Dispense Refill  . albuterol (PROVENTIL HFA;VENTOLIN HFA) 108 (90 BASE) MCG/ACT inhaler Inhale 2 puffs into the lungs every 6 (six) hours as needed for wheezing or shortness of breath.       Marland Kitchen amitriptyline (ELAVIL) 25 MG tablet Take 1 tablet (25 mg total) by mouth at bedtime.  30 tablet  2  . ARTIFICIAL TEAR OP Place 1 drop into both eyes 2 (two) times daily. Preservative free      . atenolol (TENORMIN) 25 MG  tablet Take 25 mg by mouth 2 (two) times daily.      Marland Kitchen atorvastatin (LIPITOR) 40 MG tablet Take 40 mg by mouth daily.      Tona Sensing 100 MG tablet TAKE ONE TABLET BY MOUTH TWICE DAILY  180 tablet  0  . azaTHIOprine (IMURAN) 50 MG tablet Take 50 mg by mouth 2 (two) times daily.       Marland Kitchen buPROPion (WELLBUTRIN XL) 300 MG 24 hr tablet TAKE ONE TABLET BY EVERY MORNING  90 tablet  1  . cyanocobalamin (COBAL-1000) 1000 MCG/ML injection Inject 1,000 mcg into the muscle every 30 (thirty) days.       . cycloSPORINE (RESTASIS) 0.05 % ophthalmic emulsion Place 1 drop into both eyes 2 (two) times daily.       . diazepam (VALIUM) 5 MG tablet Take 5 mg by mouth 2 (two) times daily as needed. For anxiety      . dicyclomine (BENTYL) 10 MG capsule Take 10 mg by mouth 4 (four) times daily.      Marland Kitchen docusate sodium (COLACE) 100 MG capsule Take 100 mg by mouth 2 (two) times daily.      . DULoxetine (CYMBALTA) 30 MG capsule Take 30 mg by mouth daily. Along with 60mg  to=90mg       .  DULoxetine (CYMBALTA) 60 MG capsule Take 60 mg by mouth daily. Along with 30mg  to =90mg       . esomeprazole (NEXIUM) 40 MG capsule Take 40 mg by mouth every evening.      . gabapentin (NEURONTIN) 300 MG capsule Take 900 mg by mouth 4 (four) times daily.       Marland Kitchen guaiFENesin (MUCINEX) 600 MG 12 hr tablet Take 600 mg by mouth 2 (two) times daily.       . Menthol-Methyl Salicylate (MUSCLE RUB EX) Apply 1 application topically at bedtime as needed. For pain  To back      . OVER THE COUNTER MEDICATION Place 1 application into both eyes at bedtime. "Eye Gel"      . oxybutynin (DITROPAN) 5 MG tablet Take 5 mg by mouth at bedtime.      Marland Kitchen oxyCODONE (OXY IR/ROXICODONE) 5 MG immediate release tablet Take 1 tablet (5 mg total) by mouth every 4 (four) hours as needed for pain.  30 tablet  0  . oxyCODONE-acetaminophen (PERCOCET) 10-325 MG per tablet Take 1 tablet by mouth every 4 (four) hours as needed for pain.  100 tablet  0  . predniSONE (DELTASONE) 5 MG tablet Take 5 mg by mouth daily.       . promethazine (PHENERGAN) 25 MG tablet Take 25 mg by mouth every 6 (six) hours as needed for nausea.       . traMADol (ULTRAM) 50 MG tablet TAKE ONE TABLET BY MOUTH EVERY 6 HOURS AS NEEDED FOR PAIN  90 tablet  0   No current facility-administered medications on file prior to visit.    BP 140/90  Temp(Src) 99 F (37.2 C) (Oral)  Wt 232 lb (105.235 kg)  BMI 34.24 kg/m2       Review of Systems  Constitutional: Negative for fever, chills, appetite change and fatigue.  HENT: Negative for hearing loss, ear pain, congestion, sore throat, trouble swallowing, neck stiffness, dental problem, voice change and tinnitus.   Eyes: Negative for pain, discharge and visual disturbance.  Respiratory: Negative for cough, chest tightness, wheezing and stridor.   Cardiovascular: Positive for chest pain. Negative for palpitations and leg swelling.  Gastrointestinal: Negative for nausea, vomiting, abdominal pain, diarrhea,  constipation, blood in stool and abdominal distention.  Genitourinary: Negative for urgency, hematuria, flank pain, discharge, difficulty urinating and genital sores.  Musculoskeletal: Positive for back pain. Negative for myalgias, joint swelling, arthralgias and gait problem.  Skin: Negative for rash.  Neurological: Negative for dizziness, syncope, speech difficulty, weakness, numbness and headaches.  Hematological: Negative for adenopathy. Does not bruise/bleed easily.  Psychiatric/Behavioral: Negative for behavioral problems and dysphoric mood. The patient is not nervous/anxious.        Objective:   Physical Exam  Constitutional: He is oriented to person, place, and time. He appears well-developed.  Repeat blood pressure 126/78  HENT:  Head: Normocephalic.  Right Ear: External ear normal.  Left Ear: External ear normal.  Eyes: Conjunctivae and EOM are normal.  Neck: Normal range of motion.  Cardiovascular: Normal rate.   Murmur heard. Grade 2/6 high-pitched murmur loudest at the apex  Pulmonary/Chest: Breath sounds normal.  Abdominal: Bowel sounds are normal.  Musculoskeletal: Normal range of motion. He exhibits no edema and no tenderness.  Neurological: He is alert and oriented to person, place, and time.  Psychiatric: He has a normal mood and affect. His behavior is normal.          Assessment & Plan:   Hypertension well controlled Hypertrophic cardiomyopathy. Continue beta blocker therapy Chronic pain syndrome. For a chronic pain clinic Depression stable Dyslipidemia. Continue atorvastatin  Recheck 6 months

## 2013-01-25 NOTE — Patient Instructions (Signed)
Limit your sodium (Salt) intake    It is important that you exercise regularly, at least 20 minutes 3 to 4 times per week.  If you develop chest pain or shortness of breath seek  medical attention.  You need to lose weight.  Consider a lower calorie diet and regular exercise.  Return in 6 months for follow-up   

## 2013-01-31 DIAGNOSIS — M961 Postlaminectomy syndrome, not elsewhere classified: Secondary | ICD-10-CM | POA: Diagnosis not present

## 2013-01-31 DIAGNOSIS — IMO0002 Reserved for concepts with insufficient information to code with codable children: Secondary | ICD-10-CM | POA: Diagnosis not present

## 2013-02-22 DIAGNOSIS — IMO0002 Reserved for concepts with insufficient information to code with codable children: Secondary | ICD-10-CM | POA: Diagnosis not present

## 2013-02-22 DIAGNOSIS — Z79899 Other long term (current) drug therapy: Secondary | ICD-10-CM | POA: Diagnosis not present

## 2013-02-22 DIAGNOSIS — M961 Postlaminectomy syndrome, not elsewhere classified: Secondary | ICD-10-CM | POA: Diagnosis not present

## 2013-02-22 DIAGNOSIS — G4730 Sleep apnea, unspecified: Secondary | ICD-10-CM | POA: Diagnosis not present

## 2013-02-28 DIAGNOSIS — IMO0002 Reserved for concepts with insufficient information to code with codable children: Secondary | ICD-10-CM | POA: Diagnosis not present

## 2013-03-01 ENCOUNTER — Other Ambulatory Visit: Payer: Self-pay | Admitting: Internal Medicine

## 2013-03-05 ENCOUNTER — Other Ambulatory Visit: Payer: Self-pay | Admitting: Internal Medicine

## 2013-03-20 DIAGNOSIS — R399 Unspecified symptoms and signs involving the genitourinary system: Secondary | ICD-10-CM | POA: Insufficient documentation

## 2013-03-20 DIAGNOSIS — C61 Malignant neoplasm of prostate: Secondary | ICD-10-CM | POA: Diagnosis not present

## 2013-03-20 DIAGNOSIS — N200 Calculus of kidney: Secondary | ICD-10-CM | POA: Diagnosis not present

## 2013-03-20 DIAGNOSIS — R300 Dysuria: Secondary | ICD-10-CM | POA: Diagnosis not present

## 2013-03-20 DIAGNOSIS — Z8546 Personal history of malignant neoplasm of prostate: Secondary | ICD-10-CM | POA: Diagnosis not present

## 2013-03-24 ENCOUNTER — Other Ambulatory Visit: Payer: Self-pay | Admitting: Internal Medicine

## 2013-03-27 DIAGNOSIS — R300 Dysuria: Secondary | ICD-10-CM | POA: Diagnosis not present

## 2013-03-28 DIAGNOSIS — N200 Calculus of kidney: Secondary | ICD-10-CM | POA: Diagnosis not present

## 2013-04-06 ENCOUNTER — Other Ambulatory Visit: Payer: Self-pay | Admitting: Internal Medicine

## 2013-04-11 DIAGNOSIS — G4733 Obstructive sleep apnea (adult) (pediatric): Secondary | ICD-10-CM | POA: Diagnosis not present

## 2013-04-11 DIAGNOSIS — G4730 Sleep apnea, unspecified: Secondary | ICD-10-CM | POA: Diagnosis not present

## 2013-04-12 DIAGNOSIS — M961 Postlaminectomy syndrome, not elsewhere classified: Secondary | ICD-10-CM | POA: Diagnosis not present

## 2013-04-12 DIAGNOSIS — R209 Unspecified disturbances of skin sensation: Secondary | ICD-10-CM | POA: Diagnosis not present

## 2013-04-12 DIAGNOSIS — IMO0002 Reserved for concepts with insufficient information to code with codable children: Secondary | ICD-10-CM | POA: Diagnosis not present

## 2013-04-13 ENCOUNTER — Encounter (HOSPITAL_COMMUNITY): Payer: Self-pay | Admitting: Emergency Medicine

## 2013-04-13 ENCOUNTER — Emergency Department (HOSPITAL_COMMUNITY)
Admission: EM | Admit: 2013-04-13 | Discharge: 2013-04-13 | Disposition: A | Discharge status: Home / Self Care | Payer: Worker's Compensation | Attending: Emergency Medicine | Admitting: Emergency Medicine

## 2013-04-13 DIAGNOSIS — K219 Gastro-esophageal reflux disease without esophagitis: Secondary | ICD-10-CM | POA: Insufficient documentation

## 2013-04-13 DIAGNOSIS — Z87442 Personal history of urinary calculi: Secondary | ICD-10-CM | POA: Insufficient documentation

## 2013-04-13 DIAGNOSIS — T310 Burns involving less than 10% of body surface: Secondary | ICD-10-CM | POA: Diagnosis not present

## 2013-04-13 DIAGNOSIS — T25239A Burn of second degree of unspecified toe(s) (nail), initial encounter: Secondary | ICD-10-CM | POA: Insufficient documentation

## 2013-04-13 DIAGNOSIS — E785 Hyperlipidemia, unspecified: Secondary | ICD-10-CM | POA: Insufficient documentation

## 2013-04-13 DIAGNOSIS — F3289 Other specified depressive episodes: Secondary | ICD-10-CM | POA: Insufficient documentation

## 2013-04-13 DIAGNOSIS — I10 Essential (primary) hypertension: Secondary | ICD-10-CM | POA: Insufficient documentation

## 2013-04-13 DIAGNOSIS — Z8739 Personal history of other diseases of the musculoskeletal system and connective tissue: Secondary | ICD-10-CM | POA: Insufficient documentation

## 2013-04-13 DIAGNOSIS — Z86718 Personal history of other venous thrombosis and embolism: Secondary | ICD-10-CM | POA: Insufficient documentation

## 2013-04-13 DIAGNOSIS — Z8701 Personal history of pneumonia (recurrent): Secondary | ICD-10-CM | POA: Insufficient documentation

## 2013-04-13 DIAGNOSIS — Y929 Unspecified place or not applicable: Secondary | ICD-10-CM | POA: Insufficient documentation

## 2013-04-13 DIAGNOSIS — Z87891 Personal history of nicotine dependence: Secondary | ICD-10-CM | POA: Insufficient documentation

## 2013-04-13 DIAGNOSIS — F329 Major depressive disorder, single episode, unspecified: Secondary | ICD-10-CM | POA: Insufficient documentation

## 2013-04-13 DIAGNOSIS — Y939 Activity, unspecified: Secondary | ICD-10-CM | POA: Insufficient documentation

## 2013-04-13 DIAGNOSIS — Z8639 Personal history of other endocrine, nutritional and metabolic disease: Secondary | ICD-10-CM | POA: Insufficient documentation

## 2013-04-13 DIAGNOSIS — Z8546 Personal history of malignant neoplasm of prostate: Secondary | ICD-10-CM | POA: Insufficient documentation

## 2013-04-13 DIAGNOSIS — F411 Generalized anxiety disorder: Secondary | ICD-10-CM | POA: Insufficient documentation

## 2013-04-13 DIAGNOSIS — Z8679 Personal history of other diseases of the circulatory system: Secondary | ICD-10-CM | POA: Insufficient documentation

## 2013-04-13 DIAGNOSIS — X12XXXA Contact with other hot fluids, initial encounter: Secondary | ICD-10-CM | POA: Insufficient documentation

## 2013-04-13 DIAGNOSIS — Z79899 Other long term (current) drug therapy: Secondary | ICD-10-CM | POA: Insufficient documentation

## 2013-04-13 DIAGNOSIS — E7800 Pure hypercholesterolemia, unspecified: Secondary | ICD-10-CM | POA: Insufficient documentation

## 2013-04-13 DIAGNOSIS — IMO0002 Reserved for concepts with insufficient information to code with codable children: Secondary | ICD-10-CM

## 2013-04-13 DIAGNOSIS — Z8669 Personal history of other diseases of the nervous system and sense organs: Secondary | ICD-10-CM | POA: Insufficient documentation

## 2013-04-13 DIAGNOSIS — Z862 Personal history of diseases of the blood and blood-forming organs and certain disorders involving the immune mechanism: Secondary | ICD-10-CM | POA: Insufficient documentation

## 2013-04-13 MED ORDER — SILVER SULFADIAZINE 1 % EX CREA: TOPICAL_CREAM | Freq: Every day | CUTANEOUS | Status: DC

## 2013-04-13 MED ORDER — SILVER SULFADIAZINE 1 % EX CREA
TOPICAL_CREAM | Freq: Once | CUTANEOUS | Status: AC
  Administered 2013-04-13: 14:00:00 via TOPICAL
  Filled 2013-04-13: qty 85

## 2013-04-13 MED ORDER — OXYCODONE-ACETAMINOPHEN 5-325 MG PO TABS: 1.0000 | ORAL_TABLET | Freq: Four times a day (QID) | ORAL | Status: DC | PRN

## 2013-04-13 MED ORDER — OXYCODONE-ACETAMINOPHEN 5-325 MG PO TABS
2.0000 | ORAL_TABLET | Freq: Once | ORAL | Status: AC
  Administered 2013-04-13: 2 via ORAL
  Filled 2013-04-13: qty 2

## 2013-04-13 NOTE — ED Provider Notes (Signed)
Medical screening examination/treatment/procedure(s) were performed by non-physician practitioner and as supervising physician I was immediately available for consultation/collaboration.  Geoffery Lyons, MD 04/13/13 [phone]
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
## 2013-04-13 NOTE — ED Notes (Signed)
Pt requesting drug test for work.
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
## 2013-04-16 ENCOUNTER — Encounter: Payer: Self-pay | Admitting: Internal Medicine

## 2013-04-16 ENCOUNTER — Telehealth: Payer: Self-pay | Admitting: Internal Medicine

## 2013-04-16 ENCOUNTER — Ambulatory Visit (INDEPENDENT_AMBULATORY_CARE_PROVIDER_SITE_OTHER): Payer: Medicare Other | Admitting: Internal Medicine

## 2013-04-16 VITALS — BP 110/78 | HR 65 | Temp 98.6°F | Ht 69.0 in | Wt 228.1 lb

## 2013-04-16 DIAGNOSIS — E739 Lactose intolerance, unspecified: Secondary | ICD-10-CM | POA: Diagnosis not present

## 2013-04-16 DIAGNOSIS — Z5189 Encounter for other specified aftercare: Secondary | ICD-10-CM

## 2013-04-16 DIAGNOSIS — L03119 Cellulitis of unspecified part of limb: Secondary | ICD-10-CM | POA: Diagnosis not present

## 2013-04-16 DIAGNOSIS — L02619 Cutaneous abscess of unspecified foot: Secondary | ICD-10-CM | POA: Diagnosis not present

## 2013-04-16 DIAGNOSIS — T25231A Burn of second degree of right toe(s) (nail), initial encounter: Secondary | ICD-10-CM | POA: Insufficient documentation

## 2013-04-16 DIAGNOSIS — D869 Sarcoidosis, unspecified: Secondary | ICD-10-CM | POA: Diagnosis not present

## 2013-04-16 DIAGNOSIS — T25231D Burn of second degree of right toe(s) (nail), subsequent encounter: Secondary | ICD-10-CM

## 2013-04-16 DIAGNOSIS — L03115 Cellulitis of right lower limb: Secondary | ICD-10-CM | POA: Insufficient documentation

## 2013-04-16 MED ORDER — OXYCODONE HCL 5 MG PO TABS: 5.0000 mg | ORAL_TABLET | Freq: Four times a day (QID) | ORAL | Status: DC | PRN

## 2013-04-16 MED ORDER — CLINDAMYCIN HCL 300 MG PO CAPS: 300.0000 mg | ORAL_CAPSULE | Freq: Three times a day (TID) | ORAL | Status: DC

## 2013-04-16 NOTE — Patient Instructions (Signed)
Please take all new medication as prescribed - the antibiotic (the stronger one to start with since you are on immuran) Please continue all other medications as before, including the cream with the dressing changes, and pain medication You are given the work note for the next week Please return for any worsening red, tender, or drainage or more feverish, chills
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
## 2013-04-16 NOTE — Assessment & Plan Note (Signed)
New onset x 1-2 days, without overt fever, chills but Mild to mod, for antibx course,  to f/u any worsening symptoms or concerns

## 2013-04-16 NOTE — Telephone Encounter (Signed)
Pt going to [facility]

## 2013-04-16 NOTE — Assessment & Plan Note (Signed)
Asympt,  Lab Results  Component Value Date   HGBA1C 5.7* 12/02/2012   Cont current med, not likely a signficant aspect of onset cellulitis today

## 2013-04-16 NOTE — Assessment & Plan Note (Signed)
To cont silvadene, soft shot cast, gave work note as well

## 2013-04-16 NOTE — Assessment & Plan Note (Addendum)
To cont immuran asd, though is likely factor as risk for onset cellulitis

## 2013-04-20 ENCOUNTER — Encounter: Payer: Self-pay | Admitting: Internal Medicine

## 2013-04-20 ENCOUNTER — Ambulatory Visit (INDEPENDENT_AMBULATORY_CARE_PROVIDER_SITE_OTHER): Payer: Medicare Other | Admitting: Internal Medicine

## 2013-04-20 VITALS — BP 150/90 | HR 75 | Temp 98.1°F | Resp 20 | Wt 235.0 lb

## 2013-04-20 DIAGNOSIS — L02619 Cutaneous abscess of unspecified foot: Secondary | ICD-10-CM

## 2013-04-20 DIAGNOSIS — D869 Sarcoidosis, unspecified: Secondary | ICD-10-CM | POA: Diagnosis not present

## 2013-04-20 DIAGNOSIS — L03115 Cellulitis of right lower limb: Secondary | ICD-10-CM

## 2013-04-20 DIAGNOSIS — L03119 Cellulitis of unspecified part of limb: Secondary | ICD-10-CM | POA: Diagnosis not present

## 2013-04-20 MED ORDER — CEPHALEXIN 500 MG PO CAPS: 500.0000 mg | ORAL_CAPSULE | Freq: Four times a day (QID) | ORAL | Status: DC

## 2013-04-20 NOTE — Patient Instructions (Signed)
Discontinue Cleocin  Align  one daily  Keflex 4 times a day for one weekCellulitis Cellulitis is an infection of the skin and the tissue under the skin. The infected area is usually red and tender. This happens most often in the arms and lower legs. HOME CARE   Take your antibiotic medicine as told. Finish the medicine even if you start to feel better.  Keep the infected arm or leg raised (elevated).  Put a warm cloth on the area up to 4 times per day.  Only take medicines as told by your doctor.  Keep all doctor visits as told. GET HELP RIGHT AWAY IF:   You have a fever.  You feel very sleepy.  You throw up (vomit) or have watery poop (diarrhea).  You feel sick and have muscle aches and pains.  You see red streaks on the skin coming from the infected area.  Your red area gets bigger or turns a dark color.  Your bone or joint under the infected area is painful after the skin heals.  Your infection comes back in the same area or different area.  You have a puffy (swollen) bump in the infected area.  You have new symptoms. MAKE SURE YOU:   Understand these instructions.  Will watch your condition.  Will get help right away if you are not doing well or get worse. Document Released: 02/09/2008 Document Revised: 02/22/2012 Document Reviewed: 11/08/2011 [facility] Patient Information 2014 [place], [place].
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
## 2013-04-23 ENCOUNTER — Emergency Department (HOSPITAL_COMMUNITY): Payer: Worker's Compensation

## 2013-04-23 ENCOUNTER — Encounter (HOSPITAL_COMMUNITY): Payer: Self-pay | Admitting: *Deleted

## 2013-04-23 ENCOUNTER — Emergency Department (HOSPITAL_COMMUNITY)
Admission: EM | Admit: 2013-04-23 | Discharge: 2013-04-23 | Disposition: A | Discharge status: Home / Self Care | Payer: Worker's Compensation | Attending: Emergency Medicine | Admitting: Emergency Medicine

## 2013-04-23 DIAGNOSIS — F3289 Other specified depressive episodes: Secondary | ICD-10-CM | POA: Insufficient documentation

## 2013-04-23 DIAGNOSIS — Z8619 Personal history of other infectious and parasitic diseases: Secondary | ICD-10-CM | POA: Insufficient documentation

## 2013-04-23 DIAGNOSIS — G894 Chronic pain syndrome: Secondary | ICD-10-CM | POA: Insufficient documentation

## 2013-04-23 DIAGNOSIS — Z87442 Personal history of urinary calculi: Secondary | ICD-10-CM | POA: Insufficient documentation

## 2013-04-23 DIAGNOSIS — Z8679 Personal history of other diseases of the circulatory system: Secondary | ICD-10-CM | POA: Insufficient documentation

## 2013-04-23 DIAGNOSIS — E7800 Pure hypercholesterolemia, unspecified: Secondary | ICD-10-CM | POA: Insufficient documentation

## 2013-04-23 DIAGNOSIS — Z862 Personal history of diseases of the blood and blood-forming organs and certain disorders involving the immune mechanism: Secondary | ICD-10-CM | POA: Insufficient documentation

## 2013-04-23 DIAGNOSIS — F411 Generalized anxiety disorder: Secondary | ICD-10-CM | POA: Insufficient documentation

## 2013-04-23 DIAGNOSIS — Z4800 Encounter for change or removal of nonsurgical wound dressing: Secondary | ICD-10-CM | POA: Insufficient documentation

## 2013-04-23 DIAGNOSIS — R6883 Chills (without fever): Secondary | ICD-10-CM | POA: Insufficient documentation

## 2013-04-23 DIAGNOSIS — Z8639 Personal history of other endocrine, nutritional and metabolic disease: Secondary | ICD-10-CM | POA: Insufficient documentation

## 2013-04-23 DIAGNOSIS — M1990 Unspecified osteoarthritis, unspecified site: Secondary | ICD-10-CM | POA: Insufficient documentation

## 2013-04-23 DIAGNOSIS — Z8701 Personal history of pneumonia (recurrent): Secondary | ICD-10-CM | POA: Insufficient documentation

## 2013-04-23 DIAGNOSIS — R209 Unspecified disturbances of skin sensation: Secondary | ICD-10-CM | POA: Insufficient documentation

## 2013-04-23 DIAGNOSIS — Z8546 Personal history of malignant neoplasm of prostate: Secondary | ICD-10-CM | POA: Insufficient documentation

## 2013-04-23 DIAGNOSIS — G4730 Sleep apnea, unspecified: Secondary | ICD-10-CM | POA: Insufficient documentation

## 2013-04-23 DIAGNOSIS — Z86718 Personal history of other venous thrombosis and embolism: Secondary | ICD-10-CM | POA: Insufficient documentation

## 2013-04-23 DIAGNOSIS — I10 Essential (primary) hypertension: Secondary | ICD-10-CM | POA: Insufficient documentation

## 2013-04-23 DIAGNOSIS — Z79899 Other long term (current) drug therapy: Secondary | ICD-10-CM | POA: Insufficient documentation

## 2013-04-23 DIAGNOSIS — E785 Hyperlipidemia, unspecified: Secondary | ICD-10-CM | POA: Insufficient documentation

## 2013-04-23 DIAGNOSIS — Z87891 Personal history of nicotine dependence: Secondary | ICD-10-CM | POA: Insufficient documentation

## 2013-04-23 DIAGNOSIS — K219 Gastro-esophageal reflux disease without esophagitis: Secondary | ICD-10-CM | POA: Insufficient documentation

## 2013-04-23 DIAGNOSIS — F329 Major depressive disorder, single episode, unspecified: Secondary | ICD-10-CM | POA: Insufficient documentation

## 2013-04-23 DIAGNOSIS — Z5189 Encounter for other specified aftercare: Secondary | ICD-10-CM

## 2013-04-23 MED ORDER — OXYCODONE-ACETAMINOPHEN 5-325 MG PO TABS
2.0000 | ORAL_TABLET | Freq: Once | ORAL | Status: AC
  Administered 2013-04-23: 2 via ORAL
  Filled 2013-04-23: qty 2

## 2013-04-23 MED ORDER — OXYCODONE-ACETAMINOPHEN 5-325 MG PO TABS: 2.0000 | ORAL_TABLET | Freq: Four times a day (QID) | ORAL | Status: DC | PRN

## 2013-04-23 MED ORDER — ONDANSETRON 4 MG PO TBDP
4.0000 mg | ORAL_TABLET | Freq: Once | ORAL | Status: AC
  Administered 2013-04-23: 4 mg via ORAL
  Filled 2013-04-23: qty 1
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
## 2013-04-23 NOTE — ED Notes (Signed)
Pt c/o right foot pain, sts 2 weeks ago he burned his foot, went to his PCP the following Tuesday, was given pain meds and antibiotics. Reports he has been taking them both but the pain meds just aren't working. Reports he has approx 3 days left on the antibiotics. sts he is worried he is going to loose his toes. Pt ambulatory to room, wearing post-op boot. Pt in nad, skin warm and dry, resp e/u.

## 2013-04-23 NOTE — ED Notes (Signed)
PT had hot water spill on right foot last Friday and was placed on PO antibiotics for cellulitis and no change.  Pulse present

## 2013-04-23 NOTE — ED Provider Notes (Signed)
Medical screening examination/treatment/procedure(s) were performed by non-physician practitioner and as supervising physician I was immediately available for consultation/collaboration.  Juliet Rude. Rubin Payor, MD 04/23/13 [phone]

## 2013-04-26 DIAGNOSIS — G4733 Obstructive sleep apnea (adult) (pediatric): Secondary | ICD-10-CM | POA: Diagnosis not present

## 2013-04-26 DIAGNOSIS — G4730 Sleep apnea, unspecified: Secondary | ICD-10-CM | POA: Diagnosis not present

## 2013-05-31 DIAGNOSIS — G4733 Obstructive sleep apnea (adult) (pediatric): Secondary | ICD-10-CM | POA: Insufficient documentation

## 2013-06-06 DIAGNOSIS — M549 Dorsalgia, unspecified: Secondary | ICD-10-CM | POA: Diagnosis not present

## 2013-06-11 ENCOUNTER — Other Ambulatory Visit: Payer: Self-pay | Admitting: Internal Medicine

## 2013-07-12 ENCOUNTER — Other Ambulatory Visit: Payer: Self-pay

## 2013-07-17 ENCOUNTER — Observation Stay (HOSPITAL_COMMUNITY)
Admission: EM | Admit: 2013-07-17 | Discharge: 2013-07-19 | Disposition: A | Discharge status: Home / Self Care | Payer: Medicare Other | Attending: Family Medicine | Admitting: Family Medicine

## 2013-07-17 ENCOUNTER — Encounter (HOSPITAL_COMMUNITY): Payer: Self-pay | Admitting: Emergency Medicine

## 2013-07-17 DIAGNOSIS — J42 Unspecified chronic bronchitis: Secondary | ICD-10-CM | POA: Diagnosis not present

## 2013-07-17 DIAGNOSIS — G894 Chronic pain syndrome: Secondary | ICD-10-CM | POA: Diagnosis not present

## 2013-07-17 DIAGNOSIS — D869 Sarcoidosis, unspecified: Secondary | ICD-10-CM | POA: Diagnosis present

## 2013-07-17 DIAGNOSIS — J449 Chronic obstructive pulmonary disease, unspecified: Secondary | ICD-10-CM | POA: Diagnosis not present

## 2013-07-17 DIAGNOSIS — E785 Hyperlipidemia, unspecified: Secondary | ICD-10-CM

## 2013-07-17 DIAGNOSIS — Z87442 Personal history of urinary calculi: Secondary | ICD-10-CM

## 2013-07-17 DIAGNOSIS — E538 Deficiency of other specified B group vitamins: Secondary | ICD-10-CM

## 2013-07-17 DIAGNOSIS — R0602 Shortness of breath: Secondary | ICD-10-CM | POA: Diagnosis not present

## 2013-07-17 DIAGNOSIS — Z8546 Personal history of malignant neoplasm of prostate: Secondary | ICD-10-CM | POA: Insufficient documentation

## 2013-07-17 DIAGNOSIS — E7800 Pure hypercholesterolemia, unspecified: Secondary | ICD-10-CM | POA: Insufficient documentation

## 2013-07-17 DIAGNOSIS — L03115 Cellulitis of right lower limb: Secondary | ICD-10-CM

## 2013-07-17 DIAGNOSIS — M1990 Unspecified osteoarthritis, unspecified site: Secondary | ICD-10-CM

## 2013-07-17 DIAGNOSIS — R972 Elevated prostate specific antigen [PSA]: Secondary | ICD-10-CM

## 2013-07-17 DIAGNOSIS — R509 Fever, unspecified: Secondary | ICD-10-CM

## 2013-07-17 DIAGNOSIS — F329 Major depressive disorder, single episode, unspecified: Secondary | ICD-10-CM

## 2013-07-17 DIAGNOSIS — K219 Gastro-esophageal reflux disease without esophagitis: Secondary | ICD-10-CM

## 2013-07-17 DIAGNOSIS — R0789 Other chest pain: Principal | ICD-10-CM | POA: Insufficient documentation

## 2013-07-17 DIAGNOSIS — I10 Essential (primary) hypertension: Secondary | ICD-10-CM | POA: Diagnosis not present

## 2013-07-17 DIAGNOSIS — F3289 Other specified depressive episodes: Secondary | ICD-10-CM

## 2013-07-17 DIAGNOSIS — R079 Chest pain, unspecified: Secondary | ICD-10-CM | POA: Diagnosis present

## 2013-07-17 DIAGNOSIS — E739 Lactose intolerance, unspecified: Secondary | ICD-10-CM

## 2013-07-17 DIAGNOSIS — G4730 Sleep apnea, unspecified: Secondary | ICD-10-CM

## 2013-07-17 DIAGNOSIS — I422 Other hypertrophic cardiomyopathy: Secondary | ICD-10-CM | POA: Diagnosis not present

## 2013-07-17 DIAGNOSIS — R002 Palpitations: Secondary | ICD-10-CM

## 2013-07-17 DIAGNOSIS — Z23 Encounter for immunization: Secondary | ICD-10-CM | POA: Diagnosis not present

## 2013-07-17 DIAGNOSIS — R52 Pain, unspecified: Secondary | ICD-10-CM

## 2013-07-17 DIAGNOSIS — L720 Epidermal cyst: Secondary | ICD-10-CM

## 2013-07-17 DIAGNOSIS — I428 Other cardiomyopathies: Secondary | ICD-10-CM

## 2013-07-17 DIAGNOSIS — I421 Obstructive hypertrophic cardiomyopathy: Secondary | ICD-10-CM | POA: Diagnosis present

## 2013-07-17 NOTE — ED Notes (Signed)
Pt. reports intermittent left chest pain for 1 week worse yesterday with SOB , denies nausea /vomitting or diaphoresis .

## 2013-07-18 ENCOUNTER — Emergency Department (HOSPITAL_COMMUNITY): Payer: Medicare Other

## 2013-07-18 ENCOUNTER — Encounter (HOSPITAL_COMMUNITY): Payer: Self-pay

## 2013-07-18 ENCOUNTER — Other Ambulatory Visit: Payer: Self-pay

## 2013-07-18 DIAGNOSIS — E7800 Pure hypercholesterolemia, unspecified: Secondary | ICD-10-CM | POA: Diagnosis not present

## 2013-07-18 DIAGNOSIS — I10 Essential (primary) hypertension: Secondary | ICD-10-CM

## 2013-07-18 DIAGNOSIS — R0789 Other chest pain: Secondary | ICD-10-CM | POA: Diagnosis not present

## 2013-07-18 DIAGNOSIS — R079 Chest pain, unspecified: Secondary | ICD-10-CM | POA: Diagnosis not present

## 2013-07-18 DIAGNOSIS — D869 Sarcoidosis, unspecified: Secondary | ICD-10-CM | POA: Diagnosis not present

## 2013-07-18 DIAGNOSIS — I422 Other hypertrophic cardiomyopathy: Secondary | ICD-10-CM | POA: Diagnosis not present

## 2013-07-18 DIAGNOSIS — G4730 Sleep apnea, unspecified: Secondary | ICD-10-CM

## 2013-07-18 DIAGNOSIS — J42 Unspecified chronic bronchitis: Secondary | ICD-10-CM | POA: Diagnosis not present

## 2013-07-18 DIAGNOSIS — J449 Chronic obstructive pulmonary disease, unspecified: Secondary | ICD-10-CM | POA: Diagnosis not present

## 2013-07-18 LAB — CBC
HCT: 39.6 % (ref 39.0–52.0)
Hemoglobin: 13.6 g/dL (ref 13.0–17.0)
MCH: 29.6 pg (ref 26.0–34.0)
MCHC: 34.3 g/dL (ref 30.0–36.0)
MCV: 86.3 fL (ref 78.0–100.0)
Platelets: 282 10*3/uL (ref 150–400)
RBC: 4.59 MIL/uL (ref 4.22–5.81)
RDW: 13.6 % (ref 11.5–15.5)
WBC: 3.3 10*3/uL — ABNORMAL LOW (ref 4.0–10.5)

## 2013-07-18 LAB — BASIC METABOLIC PANEL
BUN: 13 mg/dL (ref 6–23)
CO2: 25 mEq/L (ref 19–32)
Calcium: 9 mg/dL (ref 8.4–10.5)
Chloride: 104 mEq/L (ref 96–112)
Creatinine, Ser: 0.91 mg/dL (ref 0.50–1.35)
GFR calc Af Amer: >90 mL/min (ref >90)
GFR calc non Af Amer: >90 mL/min (ref >90)
Glucose, Bld: 91 mg/dL (ref 70–99)
Potassium: 3.9 mEq/L (ref 3.5–5.1)
Sodium: 138 mEq/L (ref 135–145)

## 2013-07-18 LAB — TROPONIN I
Troponin I: <0.3 ng/mL (ref <0.30)
Troponin I: <0.3 ng/mL (ref <0.30)
Troponin I: <0.3 ng/mL (ref <0.30)

## 2013-07-18 LAB — POCT I-STAT TROPONIN I: Troponin i, poc: 0 ng/mL (ref 0.00–0.08)

## 2013-07-18 LAB — PRO B NATRIURETIC PEPTIDE: Pro B Natriuretic peptide (BNP): 32.7 pg/mL (ref 0–125)

## 2013-07-18 MED ORDER — HEPARIN SODIUM (PORCINE) 5000 UNIT/ML IJ SOLN
5000.0000 [IU] | Freq: Three times a day (TID) | INTRAMUSCULAR | Status: DC
  Administered 2013-07-18 (×2): 5000 [IU] via SUBCUTANEOUS
  Filled 2013-07-18 (×4): qty 1

## 2013-07-18 MED ORDER — PNEUMOCOCCAL VAC POLYVALENT 25 MCG/0.5ML IJ INJ
0.5000 mL | INJECTION | INTRAMUSCULAR | Status: AC
  Administered 2013-07-19: 0.5 mL via INTRAMUSCULAR
  Filled 2013-07-18: qty 0.5

## 2013-07-18 MED ORDER — NITROGLYCERIN 2 % TD OINT
1.0000 [in_us] | TOPICAL_OINTMENT | Freq: Four times a day (QID) | TRANSDERMAL | Status: DC
  Administered 2013-07-18 – 2013-07-19 (×4): 1 [in_us] via TOPICAL
  Filled 2013-07-18: qty 30

## 2013-07-18 MED ORDER — OXYBUTYNIN CHLORIDE ER 5 MG PO TB24
5.0000 mg | ORAL_TABLET | Freq: Every day | ORAL | Status: DC
  Administered 2013-07-18 – 2013-07-19 (×2): 5 mg via ORAL
  Filled 2013-07-18 (×2): qty 1

## 2013-07-18 MED ORDER — NITROGLYCERIN 0.4 MG SL SUBL
0.4000 mg | SUBLINGUAL_TABLET | SUBLINGUAL | Status: DC | PRN
  Administered 2013-07-18 (×11): 0.4 mg via SUBLINGUAL
  Filled 2013-07-18: qty 25

## 2013-07-18 MED ORDER — GUAIFENESIN ER 600 MG PO TB12
600.0000 mg | ORAL_TABLET | Freq: Two times a day (BID) | ORAL | Status: DC
  Administered 2013-07-18 – 2013-07-19 (×3): 600 mg via ORAL
  Filled 2013-07-18 (×4): qty 1

## 2013-07-18 MED ORDER — INFLUENZA VAC SPLIT QUAD 0.5 ML IM SUSP
0.5000 mL | INTRAMUSCULAR | Status: AC
  Administered 2013-07-19: 0.5 mL via INTRAMUSCULAR
  Filled 2013-07-18: qty 0.5

## 2013-07-18 MED ORDER — ASPIRIN 81 MG PO CHEW
324.0000 mg | CHEWABLE_TABLET | Freq: Once | ORAL | Status: AC
  Administered 2013-07-18: 324 mg via ORAL
  Filled 2013-07-18: qty 4

## 2013-07-18 MED ORDER — OXYCODONE HCL 5 MG PO TABS
5.0000 mg | ORAL_TABLET | ORAL | Status: DC | PRN
  Administered 2013-07-18: 5 mg via ORAL
  Filled 2013-07-18: qty 1

## 2013-07-18 MED ORDER — AZATHIOPRINE 50 MG PO TABS
50.0000 mg | ORAL_TABLET | Freq: Two times a day (BID) | ORAL | Status: DC
Start: 2013-07-18 — End: 2013-07-19
  Administered 2013-07-18 – 2013-07-19 (×3): 50 mg via ORAL
  Filled 2013-07-18 (×4): qty 1

## 2013-07-18 MED ORDER — PANTOPRAZOLE SODIUM 40 MG PO TBEC
80.0000 mg | DELAYED_RELEASE_TABLET | Freq: Every day | ORAL | Status: DC
  Administered 2013-07-18 – 2013-07-19 (×2): 80 mg via ORAL
  Filled 2013-07-18 (×3): qty 2

## 2013-07-18 MED ORDER — CYANOCOBALAMIN 1000 MCG/ML IJ SOLN
1000.0000 ug | INTRAMUSCULAR | Status: DC
  Administered 2013-07-18: 1000 ug via INTRAMUSCULAR
  Filled 2013-07-18: qty 1

## 2013-07-18 MED ORDER — DULOXETINE HCL 60 MG PO CPEP
90.0000 mg | ORAL_CAPSULE | Freq: Every day | ORAL | Status: DC
  Administered 2013-07-18 – 2013-07-19 (×2): 90 mg via ORAL
  Filled 2013-07-18 (×2): qty 1

## 2013-07-18 MED ORDER — DULOXETINE HCL 60 MG PO CPEP: 60.0000 mg | ORAL_CAPSULE | Freq: Every day | ORAL | Status: DC

## 2013-07-18 MED ORDER — TRAMADOL HCL 50 MG PO TABS: 50.0000 mg | ORAL_TABLET | Freq: Four times a day (QID) | ORAL | Status: DC | PRN

## 2013-07-18 MED ORDER — GABAPENTIN 300 MG PO CAPS
900.0000 mg | ORAL_CAPSULE | Freq: Four times a day (QID) | ORAL | Status: DC
  Administered 2013-07-18 – 2013-07-19 (×6): 900 mg via ORAL
  Filled 2013-07-18 (×8): qty 3

## 2013-07-18 MED ORDER — ACETAMINOPHEN 325 MG PO TABS
650.0000 mg | ORAL_TABLET | ORAL | Status: DC | PRN
  Administered 2013-07-19: 650 mg via ORAL
  Filled 2013-07-18: qty 2

## 2013-07-18 MED ORDER — HEPARIN (PORCINE) IN NACL 100-0.45 UNIT/ML-% IJ SOLN
1300.0000 [IU]/h | INTRAMUSCULAR | Status: DC
  Administered 2013-07-18 – 2013-07-19 (×2): 1300 [IU]/h via INTRAVENOUS
  Filled 2013-07-18 (×2): qty 250

## 2013-07-18 MED ORDER — DULOXETINE HCL 30 MG PO CPEP: 30.0000 mg | ORAL_CAPSULE | Freq: Every day | ORAL | Status: DC

## 2013-07-18 MED ORDER — MORPHINE SULFATE 4 MG/ML IJ SOLN
4.0000 mg | Freq: Once | INTRAMUSCULAR | Status: AC
  Administered 2013-07-18: 4 mg via INTRAVENOUS
  Filled 2013-07-18: qty 1

## 2013-07-18 MED ORDER — ONDANSETRON HCL 4 MG/2ML IJ SOLN: 4.0000 mg | Freq: Four times a day (QID) | INTRAMUSCULAR | Status: DC | PRN

## 2013-07-18 MED ORDER — DICYCLOMINE HCL 10 MG PO CAPS
10.0000 mg | ORAL_CAPSULE | Freq: Four times a day (QID) | ORAL | Status: DC
  Administered 2013-07-18 – 2013-07-19 (×6): 10 mg via ORAL
  Filled 2013-07-18 (×8): qty 1

## 2013-07-18 MED ORDER — ATENOLOL 25 MG PO TABS
25.0000 mg | ORAL_TABLET | Freq: Two times a day (BID) | ORAL | Status: DC
  Administered 2013-07-18 – 2013-07-19 (×3): 25 mg via ORAL
  Filled 2013-07-18 (×4): qty 1

## 2013-07-18 MED ORDER — HEPARIN BOLUS VIA INFUSION
2000.0000 [IU] | Freq: Once | INTRAVENOUS | Status: AC
  Administered 2013-07-18: 2000 [IU] via INTRAVENOUS
  Filled 2013-07-18: qty 2000

## 2013-07-18 MED ORDER — PREDNISONE 5 MG PO TABS
5.0000 mg | ORAL_TABLET | Freq: Every day | ORAL | Status: DC
  Administered 2013-07-18 – 2013-07-19 (×2): 5 mg via ORAL
  Filled 2013-07-18 (×2): qty 1

## 2013-07-18 MED ORDER — OXYCODONE-ACETAMINOPHEN 5-325 MG PO TABS
1.0000 | ORAL_TABLET | ORAL | Status: DC | PRN
  Administered 2013-07-18 – 2013-07-19 (×2): 1 via ORAL
  Filled 2013-07-18 (×2): qty 1

## 2013-07-18 MED ORDER — SODIUM CHLORIDE 0.9 % IV SOLN
250.0000 mL | INTRAVENOUS | Status: DC | PRN
  Administered 2013-07-19: via INTRAVENOUS

## 2013-07-18 MED ORDER — ALBUTEROL SULFATE HFA 108 (90 BASE) MCG/ACT IN AERS: 2.0000 | INHALATION_SPRAY | Freq: Four times a day (QID) | RESPIRATORY_TRACT | Status: DC | PRN

## 2013-07-18 MED ORDER — DOCUSATE SODIUM 100 MG PO CAPS
100.0000 mg | ORAL_CAPSULE | Freq: Every day | ORAL | Status: DC
  Administered 2013-07-18 – 2013-07-19 (×2): 100 mg via ORAL
  Filled 2013-07-18 (×2): qty 1

## 2013-07-18 MED ORDER — AMITRIPTYLINE HCL 25 MG PO TABS
25.0000 mg | ORAL_TABLET | Freq: Every day | ORAL | Status: DC
  Administered 2013-07-18: 25 mg via ORAL
  Filled 2013-07-18 (×2): qty 1

## 2013-07-18 MED ORDER — PROMETHAZINE HCL 25 MG PO TABS: 25.0000 mg | ORAL_TABLET | Freq: Four times a day (QID) | ORAL | Status: DC | PRN

## 2013-07-18 MED ORDER — CYCLOSPORINE 0.05 % OP EMUL
1.0000 [drp] | Freq: Two times a day (BID) | OPHTHALMIC | Status: DC
Start: 2013-07-18 — End: 2013-07-19
  Administered 2013-07-18 – 2013-07-19 (×3): 1 [drp] via OPHTHALMIC
  Filled 2013-07-18 (×4): qty 1

## 2013-07-18 MED ORDER — SODIUM CHLORIDE 0.9 % IJ SOLN
3.0000 mL | Freq: Two times a day (BID) | INTRAMUSCULAR | Status: DC
  Administered 2013-07-19: 3 mL via INTRAVENOUS

## 2013-07-18 MED ORDER — ATORVASTATIN CALCIUM 40 MG PO TABS
40.0000 mg | ORAL_TABLET | Freq: Every day | ORAL | Status: DC
  Administered 2013-07-18 – 2013-07-19 (×2): 40 mg via ORAL
  Filled 2013-07-18 (×2): qty 1

## 2013-07-18 MED ORDER — BUPROPION HCL ER (XL) 300 MG PO TB24
300.0000 mg | ORAL_TABLET | Freq: Every day | ORAL | Status: DC
  Administered 2013-07-18 – 2013-07-19 (×2): 300 mg via ORAL
  Filled 2013-07-18 (×2): qty 1

## 2013-07-18 MED ORDER — OXYCODONE-ACETAMINOPHEN 10-325 MG PO TABS: 1.0000 | ORAL_TABLET | ORAL | Status: DC | PRN

## 2013-07-18 MED ORDER — ASPIRIN 81 MG PO CHEW
81.0000 mg | CHEWABLE_TABLET | ORAL | Status: AC
  Administered 2013-07-19: 81 mg via ORAL
  Filled 2013-07-18: qty 1

## 2013-07-18 MED ORDER — SODIUM CHLORIDE 0.9 % IJ SOLN: 3.0000 mL | INTRAMUSCULAR | Status: DC | PRN

## 2013-07-18 NOTE — Progress Notes (Signed)
Pt says CP is "creeping back" and back up to 5/10.  Theodore Demark, PA notified.  Will obtain EKG and give 3 SL Ntg.  VSS.  After the Ntg, pt states CP is now 4/10.  Trop negative x2.  Will continue to monitor.

## 2013-07-18 NOTE — Consult Note (Signed)
CARDIOLOGY CONSULT NOTE       Patient ID: [name] MRN: [id number] DOB/AGE: [birth year] 47 y.o.  Admit date: 07/17/2013 Referring Physician:  Julian Reil Primary Physician: Rogelia Boga, MD Primary Cardiologist:   Reason for Consultation: Jens Som  Principal Problem:   Chest pain at rest Active Problems:   NEUROSARCOIDOSIS   SYNDROME, CHRONIC PAIN   CARDIOMYOPATHY, IDIOPATHIC HYPERTROPHIC   HPI:  [name] is a 47 y.o. male admitted with chest pain Evaluated for same in March of this year . He has been followed in the past at [facility] for pulmonary and neurosarcoidosis. He has a hx of idiopathic hypertrophic cardiomyopathy, depression, HL, GERD, HTN, and glucose intolerance. He has a remote hx of cardiac cath in 1998. He was seen for chest pain by cardiology in 12/2011. Myoview 12/29/11: No ischemia, EF 66%. He was admitted 3/28-4/1 with chest pain. He was again seen by cardiology. Symptoms were felt to be atypical for ischemia. Cardiac markers were negative. D-dimer was negative. Echocardiogram 12/01/12: Mild focal basal hypertrophy the septum, EF 55-60%, grade 1 diastolic dysfunction, mild LAE. Patient had palpitations. He had one ventricular couplet on telemetry. No further cardiac workup was recommended.  Starting yesterday has had SSCP radiating to left. Not postitional or pleuritic Help with nitro and MS in ER.  Still with intermitant pain. No dyspnea. Indicates cardiac MRI at [facility] within the last two years and no sarcoid.    ROS All other systems reviewed and negative except as noted above  Past Medical History  Diagnosis Date  . B12 DEFICIENCY 03/06/2007  . CARDIOMYOPATHY, IDIOPATHIC HYPERTROPHIC 03/06/2007  . DEPRESSION 03/06/2007  . GERD 06/01/2007  . HYPERCHOLESTEROLEMIA 03/06/2007  . HYPERLIPIDEMIA 11/08/2008  . IMPAIRED GLUCOSE TOLERANCE 11/08/2008  . NEPHROLITHIASIS, HX OF 04/07/2010  . 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 OSTEOARTHRITIS 06/01/2007    . Palpitations 10/23/2007  . SYNDROME, CHRONIC PAIN 03/06/2007  . Shortness of breath   . Cancer     prostate ca  . Prostate cancer   . HYPERTENSION 03/06/2007    Dr. Amador Cunas  . Pneumonia     hx of  . Kidney stones     hx of , had surgery for removal  . Dysrhythmia     sees Dr. Jens Som for "palpitations"  . DVT (deep venous thrombosis)   . Anginal pain   . Anxiety   . SLEEP APNEA 03/06/2007    uses vpap, last sleep study 2011, Dr. Rhona Raider, at [facility]    Family History  Problem Relation Age of Onset  . Heart disease Neg Hx     family hx CHF  . Kidney disease Neg Hx     family hx   . Cancer Neg Hx     family hx prostate    History   Social History  . Marital Status: Married    Spouse Name: N/A    Number of Children: N/A  . 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 Smoking status: Former Smoker    Quit date: 09/07/1983  . Smokeless tobacco: Never Used  . Alcohol Use: No  . Drug Use: No  . Sexual Activity: Yes   Other Topics Concern  . Not on file   Social History Narrative  . No narrative on file    Past Surgical History  Procedure Laterality Date  . Decompression and fusion      cervicothoracic  . Partial  nephrectomy    . Cardiac catheterization    . Lumbar fusion    . Prostate surgery      robotic prostatectomy  . Penile prosthesis implant    . Tonsillectomy    . Leg skin lesion  biopsy / excision      left leg     . amitriptyline  25 mg Oral QHS  . atenolol  25 mg Oral BID  . atorvastatin  40 mg Oral Daily  . azaTHIOprine  50 mg Oral BID  . buPROPion  300 mg Oral Daily  . cyanocobalamin  1,000 mcg Intramuscular Q30 days  . cycloSPORINE  1 drop Both Eyes BID  . dicyclomine  10 mg Oral QID  . docusate sodium  100 mg Oral Daily  . DULoxetine  90 mg Oral Daily  . gabapentin  900 mg Oral QID  . guaiFENesin  600 mg Oral BID  . heparin  5,000 Units Subcutaneous Q8H  . [START ON  07/19/2013] influenza vac split quadrivalent PF  0.5 mL Intramuscular Tomorrow-1000  . oxybutynin  5 mg Oral Daily  . pantoprazole  80 mg Oral Q1200  . [START ON 07/19/2013] pneumococcal 23 valent vaccine  0.5 mL Intramuscular Tomorrow-1000  . predniSONE  5 mg Oral Daily      Physical Exam: Blood pressure 105/64, pulse 65, temperature 98.4 F (36.9 C), temperature source Oral, resp. rate 18, height 5\' 9"  (1.753 m), weight 218 lb 9.6 oz (99.156 kg), SpO2 99.00%.   Affect appropriate Healthy:  appears stated age HEENT: normal Neck supple with no adenopathy JVP normal no bruits no thyromegaly Lungs clear with no wheezing and good diaphragmatic motion Heart:  S1/S2 no murmur, no rub, gallop or click PMI normal Abdomen: benighn, BS positve, no tenderness, no AAA no bruit.  No HSM or HJR Distal pulses intact with no bruits No edema Neuro non-focal Skin warm and dry No muscular weakness   Labs:   Lab Results  Component Value Date   WBC 3.3* 07/17/2013   HGB 13.6 07/17/2013   HCT 39.6 07/17/2013   MCV 86.3 07/17/2013   PLT 282 07/17/2013    Recent Labs Lab 07/17/13 2351  NA 138  K 3.9  CL 104  CO2 25  BUN 13  CREATININE 0.91  CALCIUM 9.0  GLUCOSE 91   Lab Results  Component Value Date   CKTOTAL 90 12/30/2011   CKMB 2.1 12/30/2011   TROPONINI <0.30 07/18/2013    Lab Results  Component Value Date   CHOL 130 12/29/2011   CHOL 141 05/25/2011   CHOL 123 04/07/2010   Lab Results  Component Value Date   HDL 56 12/29/2011   HDL 55.70 05/25/2011   HDL 40.98 07/10/2009   Lab Results  Component Value Date   LDLCALC 66 12/29/2011   LDLCALC 72 05/25/2011   LDLCALC 48 07/10/2009   Lab Results  Component Value Date   TRIG 38 12/29/2011   TRIG 69.0 05/25/2011   TRIG 53.0 07/10/2009   Lab Results  Component Value Date   CHOLHDL 2.3 12/29/2011   CHOLHDL 3 05/25/2011   CHOLHDL 2 07/10/2009   No results found for this basename: LDLDIRECT      Radiology: Dg Chest 2  View  07/18/2013   CLINICAL DATA:  Chest pain.  EXAM: CHEST  2 VIEW  COMPARISON:  12/01/2012 and chest CTA dated 09/09/2012.  FINDINGS: The heart remains normal in size. Stable mildly prominent right hilum with previously demonstrated adenopathy. Stable hyperexpansion  of the lungs and diffuse peribronchial thickening and accentuation of the interstitial markings. Cervical spine fixation hardware. Mild thoracic spine degenerative changes.  IMPRESSION: 1. No acute abnormality. 2. Stable mild right hilar adenopathy. 3. Stable mild changes of COPD and chronic bronchitis.   Electronically Signed   By: Gordan Payment M.D.   On: 07/18/2013 01:02    EKG:  SR rate 76 septal infarct no acute ST/T wave changes   ASSESSMENT AND PLAN:  Chest Pain: Requiring multiple ER visits Atypical features but help with nitro and MS.  No acute ECG changes and negative enzymes Last myovue 4/13 normal Favor diagnostic radial cath for definitive exclusion of CAD.  He is likely to continue to come to ER/hospital without cath. Body habitus less favorable for CT And repeat nuclear would not settle issue Discussed with patient willing to proceed Sarcoid:  No need for repeat cardiac MRI no AV block on ECG and done at [facility] per patient Chol: continue statin   Signed: Charlton Haws 07/18/2013, 1:26 PM

## 2013-07-18 NOTE — Progress Notes (Signed)
Pt was seen and examined.  H&P reviewed, consult called and notes reviewed.    Maryln Manuel, MD

## 2013-07-18 NOTE — ED Notes (Signed)
Patient returned from xray at this time, placed on cardiac monitor.

## 2013-07-18 NOTE — Progress Notes (Signed)
INITIAL NUTRITION ASSESSMENT  DOCUMENTATION CODES Per approved criteria  -Obesity Unspecified   INTERVENTION:  Diet advancement as able per MD. If intake is inadequate, may need snacks or PO supplements between meals.  NUTRITION DIAGNOSIS: Inadequate oral intake related to poor appetite as evidenced by 7 lb weight loss in the past month.  Goal: Intake to meet >90% of estimated nutrition needs.  Monitor:  Diet advancement, PO intake, labs, weight trend.  Reason for Assessment: MST=2  47 y.o. male  Admitting Dx: Chest pain at rest  ASSESSMENT: Patient presented to the ED on 11/11 with chest pain. 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   Patient reports recent poor PO intake due to poor appetite. He c/o stomach pain and rumbling when he eats. Remains NPO at this time. Patient says that he is hungry. Weight loss of 3% in the past month is insignificant, however, patient is at nutrition risk, given recent poor PO intake.  Discussed eating small, frequent meals.   Nutrition focused physical exam completed.  No muscle or subcutaneous fat depletion noticed.  Height: Ht Readings from Last 1 Encounters:  07/18/13 5\' 9"  (1.753 m)    Weight: Wt Readings from Last 1 Encounters:  07/18/13 218 lb 9.6 oz (99.156 kg)    Ideal Body Weight: 72.7 kg  % Ideal Body Weight: 136%  Wt Readings from Last 10 Encounters:  07/18/13 218 lb 9.6 oz (99.156 kg)  04/23/13 225 lb (102.059 kg)  04/20/13 235 lb (106.595 kg)  04/16/13 228 lb 2 oz (103.477 kg)  01/25/13 232 lb (105.235 kg)  01/03/13 233 lb 12.8 oz (106.051 kg)  12/03/12 230 lb 13.2 oz (104.7 kg)  09/09/12 225 lb (102.059 kg)  08/21/12 223 lb 8.7 oz (101.4 kg)  08/21/12 223 lb 8.7 oz (101.4 kg)    Usual Body Weight: 225 lb 3 weeks ago per patient  % Usual Body Weight: 97%  BMI:  Body mass index is 32.27 kg/(m^2).  Estimated Nutritional Needs: Kcal: 1900-2100 Protein:  105-120 gm Fluid: 1.9-2.1 L  Skin: no wounds  Diet Order: NPO  EDUCATION NEEDS: -Education needs addressed--discussed ways to increase oral intake   Intake/Output Summary (Last 24 hours) at 07/18/13 1032 Last data filed at 07/18/13 0900  Gross per 24 hour  Intake    120 ml  Output    300 ml  Net   -180 ml    Last BM: 11/11   Labs:   Recent Labs Lab 07/17/13 2351  NA 138  K 3.9  CL 104  CO2 25  BUN 13  CREATININE 0.91  CALCIUM 9.0  GLUCOSE 91    CBG (last 3)  No results found for this basename: GLUCAP,  in the last 72 hours  Scheduled Meds: . amitriptyline  25 mg Oral QHS  . atenolol  25 mg Oral BID  . atorvastatin  40 mg Oral Daily  . azaTHIOprine  50 mg Oral BID  . buPROPion  300 mg Oral Daily  . cyanocobalamin  1,000 mcg Intramuscular Q30 days  . cycloSPORINE  1 drop Both Eyes BID  . dicyclomine  10 mg Oral QID  . docusate sodium  100 mg Oral Daily  . DULoxetine  90 mg Oral Daily  . gabapentin  900 mg Oral QID  . guaiFENesin  600 mg Oral BID  . heparin  5,000 Units Subcutaneous Q8H  . [START ON 07/19/2013] influenza vac split quadrivalent PF  0.5 mL Intramuscular Tomorrow-1000  .  oxybutynin  5 mg Oral Daily  . pantoprazole  80 mg Oral Q1200  . [START ON 07/19/2013] pneumococcal 23 valent vaccine  0.5 mL Intramuscular Tomorrow-1000  . predniSONE  5 mg Oral Daily    Continuous Infusions:   Past Medical History  Diagnosis Date  . B12 DEFICIENCY 03/06/2007  . CARDIOMYOPATHY, IDIOPATHIC HYPERTROPHIC 03/06/2007  . DEPRESSION 03/06/2007  . GERD 06/01/2007  . HYPERCHOLESTEROLEMIA 03/06/2007  . HYPERLIPIDEMIA 11/08/2008  . IMPAIRED GLUCOSE TOLERANCE 11/08/2008  . NEPHROLITHIASIS, HX OF 04/07/2010  . 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 OSTEOARTHRITIS 06/01/2007  . Palpitations 10/23/2007  . SYNDROME, CHRONIC PAIN 03/06/2007  . Shortness of breath   . Cancer     prostate ca  . Prostate cancer   . HYPERTENSION 03/06/2007    Dr. Amador Cunas  . Pneumonia     hx of   . Kidney stones     hx of , had surgery for removal  . Dysrhythmia     sees Dr. Jens Som for "palpitations"  . DVT (deep venous thrombosis)   . Anginal pain   . Anxiety   . SLEEP APNEA 03/06/2007    uses vpap, last sleep study 2011, Dr. Rhona Raider, at [facility]    Past Surgical History  Procedure Laterality Date  . Decompression and fusion      cervicothoracic  . Partial nephrectomy    . Cardiac catheterization    . Lumbar fusion    . Prostate surgery      robotic prostatectomy  . Penile prosthesis implant    . Tonsillectomy    . Leg skin lesion  biopsy / excision      left leg    Joaquin Courts, RD, LDN, CNSC Pager [phone] After Hours Pager [phone]

## 2013-07-18 NOTE — Progress Notes (Signed)
Pt is now stating his CP is a 3/10, after getting 3 SL Ntg.  CP was a 7/10 when he arrived to unit.  1 Percocet was given at 0700; 5mg  Oxy IR given at 0859, then the 3 SL Ntg given at 1021.  Pt states the tightness in chest is better, but it is still there.  2L O2 applied via nasal canula.  Dr. Laural Benes has been notified and Cardiology Consult was ordered. Pt NPO until 1600 today, per previous order.  Will continue to monitor pt.

## 2013-07-18 NOTE — Progress Notes (Signed)
ANTICOAGULATION CONSULT NOTE - Initial Consult  Pharmacy Consult for Heparin Indication: chest pain/ACS  Allergies  Allergen Reactions  . Adhesive [Tape] Other (See Comments)    Skin irriation    Patient Measurements: Height: 5\' 9"  (175.3 cm) Weight: 218 lb 9.6 oz (99.156 kg) IBW/kg (Calculated) : 70.7 Heparin Dosing Weight: 92kg  Vital Signs: Temp: 98.3 F (36.8 C) (11/12 1607) Temp src: Oral (11/12 1315) BP: 104/48 mmHg (11/12 1647) Pulse Rate: 70 (11/12 1647)  Labs:  Recent Labs  07/17/13 2351 07/18/13 0855 07/18/13 1212  HGB 13.6  --   --   HCT 39.6  --   --   PLT 282  --   --   CREATININE 0.91  --   --   TROPONINI  --  <0.30 <0.30    Estimated Creatinine Clearance: 115.3 ml/min (by C-G formula based on Cr of 0.91).   Medical History: Past Medical History  Diagnosis Date  . B12 DEFICIENCY 03/06/2007  . CARDIOMYOPATHY, IDIOPATHIC HYPERTROPHIC 03/06/2007  . DEPRESSION 03/06/2007  . GERD 06/01/2007  . HYPERCHOLESTEROLEMIA 03/06/2007  . HYPERLIPIDEMIA 11/08/2008  . IMPAIRED GLUCOSE TOLERANCE 11/08/2008  . NEPHROLITHIASIS, HX OF 04/07/2010  . 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 OSTEOARTHRITIS 06/01/2007  . Palpitations 10/23/2007  . SYNDROME, CHRONIC PAIN 03/06/2007  . Shortness of breath   . Cancer     prostate ca  . Prostate cancer   . HYPERTENSION 03/06/2007    Dr. Amador Cunas  . Pneumonia     hx of  . Kidney stones     hx of , had surgery for removal  . Dysrhythmia     sees Dr. Jens Som for "palpitations"  . DVT (deep venous thrombosis)   . Anginal pain   . Anxiety   . SLEEP APNEA 03/06/2007    uses vpap, last sleep study 2011, Dr. Rhona Raider, at [facility]    Medications:  Prescriptions prior to admission  Medication Sig Dispense Refill  . albuterol (PROVENTIL HFA;VENTOLIN HFA) 108 (90 BASE) MCG/ACT inhaler Inhale 2 puffs into the lungs every 6 (six) hours as needed for wheezing or shortness of breath.       Marland Kitchen amitriptyline (ELAVIL) 25 MG tablet Take 1 tablet  (25 mg total) by mouth at bedtime.  30 tablet  2  . atenolol (TENORMIN) 25 MG tablet Take 25 mg by mouth 2 (two) times daily.      Marland Kitchen atorvastatin (LIPITOR) 40 MG tablet Take 40 mg by mouth daily.      Marland Kitchen azaTHIOprine (IMURAN) 50 MG tablet Take 50 mg by mouth 2 (two) times daily.       Marland Kitchen buPROPion (WELLBUTRIN XL) 300 MG 24 hr tablet Take 300 mg by mouth daily.      . cyanocobalamin (COBAL-1000) 1000 MCG/ML injection Inject 1,000 mcg into the muscle every 30 (thirty) days.       . cycloSPORINE (RESTASIS) 0.05 % ophthalmic emulsion Place 1 drop into both eyes 2 (two) times daily.       Marland Kitchen dicyclomine (BENTYL) 10 MG capsule Take 10 mg by mouth 4 (four) times daily.      Marland Kitchen docusate sodium (COLACE) 100 MG capsule Take 100 mg by mouth daily.       . DULoxetine (CYMBALTA) 30 MG capsule Take 30 mg by mouth daily. Along with 60mg  to equal a 90mg  daily dose      . DULoxetine (CYMBALTA) 60 MG capsule Take 60 mg by mouth daily. Along with 30mg  to =90mg       .  esomeprazole (NEXIUM) 40 MG capsule Take 40 mg by mouth every evening.      . gabapentin (NEURONTIN) 300 MG capsule Take 900 mg by mouth 4 (four) times daily.       Marland Kitchen guaiFENesin (MUCINEX) 600 MG 12 hr tablet Take 600 mg by mouth 2 (two) times daily.       Marland Kitchen oxybutynin (DITROPAN-XL) 5 MG 24 hr tablet Take 5 mg by mouth daily.      Marland Kitchen oxyCODONE-acetaminophen (PERCOCET) 10-325 MG per tablet Take 1 tablet by mouth every 4 (four) hours as needed for pain.  100 tablet  0  . predniSONE (DELTASONE) 5 MG tablet Take 5 mg by mouth daily.       . promethazine (PHENERGAN) 25 MG tablet Take 25 mg by mouth every 6 (six) hours as needed for nausea.       . traMADol (ULTRAM) 50 MG tablet Take 50 mg by mouth every 6 (six) hours as needed for pain.        Assessment: 47yom to start heparin for CP/ACS. Patient reports no bleeding and not on any anticoagulants. Patient received subcutaneous heparin 5000 units today ~1430 - will give reduced bolus.  - Baseline INR  ordered - H/H and Plts wnl  Goal of Therapy:  Heparin level 0.3-0.7 units/ml Monitor platelets by anticoagulation protocol: Yes   Plan:  1. Heparin IV bolus 2000 units x 1 2. Heparin drip 1300 units/hr (13 ml/hr) 3. Check heparin level 6 hours after initiation 4. Daily heparin level and CBC  Cleon Dew [phone] 07/18/2013,5:10 PM

## 2013-07-18 NOTE — ED Provider Notes (Signed)
CSN: [id number]     Arrival date & time 07/17/13  2342 History   First MD Initiated Contact with Patient 07/18/13 0024     Chief Complaint  Patient presents with  . Chest Pain   (Consider location/radiation/quality/duration/timing/severity/associated sxs/prior Treatment) HPI Comments: Patient is a 47 y.o. male with a PMHx of idiopathic hypertrophic cardiomyopathy, depression, hypercholesterolemia, reflux disease, hypertension, neurosarcoidosis, palpitations and impaired glucose tolerance. Pt comes in with cc of chest pain. Pt started having chest pain intermittently 1 week ago. The pain is intermittent, left sided, and has no specific aggravating or relieving factors. Pt had some dib, no nausea, diaphoresis. Pt had a stress in 2013, that was neg.  + family hx of premature CAD, mother had a MI in her 32s.  Patient is a 47 y.o. male presenting with chest pain. The history is provided by the patient and medical records.  Chest Pain Associated symptoms: no abdominal pain, no cough and no shortness of breath     Past Medical History  Diagnosis Date  . B12 DEFICIENCY 03/06/2007  . CARDIOMYOPATHY, IDIOPATHIC HYPERTROPHIC 03/06/2007  . DEPRESSION 03/06/2007  . GERD 06/01/2007  . HYPERCHOLESTEROLEMIA 03/06/2007  . HYPERLIPIDEMIA 11/08/2008  . IMPAIRED GLUCOSE TOLERANCE 11/08/2008  . NEPHROLITHIASIS, HX OF 04/07/2010  . 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 OSTEOARTHRITIS 06/01/2007  . Palpitations 10/23/2007  . SYNDROME, CHRONIC PAIN 03/06/2007  . Shortness of breath   . Cancer     prostate ca  . Prostate cancer   . HYPERTENSION 03/06/2007    Dr. Amador Cunas  . Pneumonia     hx of  . Kidney stones     hx of , had surgery for removal  . Dysrhythmia     sees Dr. Jens Som for "palpitations"  . DVT (deep venous thrombosis)   . Anginal pain   . Anxiety   . SLEEP APNEA 03/06/2007    uses vpap, last sleep study 2011, Dr. Rhona Raider, at [facility]   Past Surgical History  Procedure Laterality Date  .  Decompression and fusion      cervicothoracic  . Partial nephrectomy    . Cardiac catheterization    . Lumbar fusion    . Prostate surgery      robotic prostatectomy  . Penile prosthesis implant    . Tonsillectomy    . Leg skin lesion  biopsy / excision      left leg   Family History  Problem Relation Age of Onset  . Heart disease Neg Hx     family hx CHF  . Kidney disease Neg Hx     family hx   . Cancer Neg Hx     family hx prostate   History  Substance Use Topics  . Smoking status: Former Smoker    Quit date: 09/07/1983  . Smokeless tobacco: Never Used  . Alcohol Use: No    Review of Systems  Constitutional: Negative for activity change and appetite change.  Respiratory: Negative for cough and shortness of breath.   Cardiovascular: Positive for chest pain.  Gastrointestinal: Negative for abdominal pain.  Genitourinary: Negative for dysuria.    Allergies  Adhesive  Home Medications   Current Outpatient Rx  Name  Route  Sig  Dispense  Refill  . albuterol (PROVENTIL HFA;VENTOLIN HFA) 108 (90 BASE) MCG/ACT inhaler   Inhalation   Inhale 2 puffs into the lungs every 6 (six) hours as needed for wheezing or shortness of breath.          Marland Kitchen  amitriptyline (ELAVIL) 25 MG tablet   Oral   Take 1 tablet (25 mg total) by mouth at bedtime.   30 tablet   2   . atenolol (TENORMIN) 25 MG tablet   Oral   Take 25 mg by mouth 2 (two) times daily.         Marland Kitchen atorvastatin (LIPITOR) 40 MG tablet   Oral   Take 40 mg by mouth daily.         Marland Kitchen azaTHIOprine (IMURAN) 50 MG tablet   Oral   Take 50 mg by mouth 2 (two) times daily.          Marland Kitchen buPROPion (WELLBUTRIN XL) 300 MG 24 hr tablet   Oral   Take 300 mg by mouth daily.         . cyanocobalamin (COBAL-1000) 1000 MCG/ML injection   Intramuscular   Inject 1,000 mcg into the muscle every 30 (thirty) days.          . cycloSPORINE (RESTASIS) 0.05 % ophthalmic emulsion   Both Eyes   Place 1 drop into both eyes 2  (two) times daily.          Marland Kitchen dicyclomine (BENTYL) 10 MG capsule   Oral   Take 10 mg by mouth 4 (four) times daily.         Marland Kitchen docusate sodium (COLACE) 100 MG capsule   Oral   Take 100 mg by mouth daily.          . DULoxetine (CYMBALTA) 30 MG capsule   Oral   Take 30 mg by mouth daily. Along with 60mg  to equal a 90mg  daily dose         . DULoxetine (CYMBALTA) 60 MG capsule   Oral   Take 60 mg by mouth daily. Along with 30mg  to =90mg          . esomeprazole (NEXIUM) 40 MG capsule   Oral   Take 40 mg by mouth every evening.         . gabapentin (NEURONTIN) 300 MG capsule   Oral   Take 900 mg by mouth 4 (four) times daily.          Marland Kitchen guaiFENesin (MUCINEX) 600 MG 12 hr tablet   Oral   Take 600 mg by mouth 2 (two) times daily.          Marland Kitchen oxybutynin (DITROPAN-XL) 5 MG 24 hr tablet   Oral   Take 5 mg by mouth daily.         Marland Kitchen oxyCODONE-acetaminophen (PERCOCET) 10-325 MG per tablet   Oral   Take 1 tablet by mouth every 4 (four) hours as needed for pain.   100 tablet   0   . predniSONE (DELTASONE) 5 MG tablet   Oral   Take 5 mg by mouth daily.          . promethazine (PHENERGAN) 25 MG tablet   Oral   Take 25 mg by mouth every 6 (six) hours as needed for nausea.          . traMADol (ULTRAM) 50 MG tablet   Oral   Take 50 mg by mouth every 6 (six) hours as needed for pain.          BP 91/61  Pulse 70  Temp(Src) 98.5 F (36.9 C) (Oral)  Resp 21  Ht 5\' 9"  (1.753 m)  Wt 227 lb 4.8 oz (103.103 kg)  BMI 33.55 kg/m2  SpO2 97% Physical Exam  Nursing  note and vitals reviewed. Constitutional: He is oriented to person, place, and time. He appears well-developed.  HENT:  Head: Normocephalic and atraumatic.  Eyes: Conjunctivae and EOM are normal. Pupils are equal, round, and reactive to light.  Neck: Normal range of motion. Neck supple.  Cardiovascular: Normal rate and regular rhythm.   No murmur heard. Pulmonary/Chest: Effort normal and breath  sounds normal.  Abdominal: Soft. Bowel sounds are normal. He exhibits no distension. There is no tenderness. There is no rebound and no guarding.  Neurological: He is alert and oriented to person, place, and time.  Skin: Skin is warm.    ED Course  Procedures (including critical care time) Labs Review Labs Reviewed  CBC - Abnormal; Notable for the following:    WBC 3.3 (*)    All other components within normal limits  BASIC METABOLIC PANEL  PRO B NATRIURETIC PEPTIDE  POCT I-STAT TROPONIN I   Imaging Review Dg Chest 2 View  07/18/2013   CLINICAL DATA:  Chest pain.  EXAM: CHEST  2 VIEW  COMPARISON:  12/01/2012 and chest CTA dated 09/09/2012.  FINDINGS: The heart remains normal in size. Stable mildly prominent right hilum with previously demonstrated adenopathy. Stable hyperexpansion of the lungs and diffuse peribronchial thickening and accentuation of the interstitial markings. Cervical spine fixation hardware. Mild thoracic spine degenerative changes.  IMPRESSION: 1. No acute abnormality. 2. Stable mild right hilar adenopathy. 3. Stable mild changes of COPD and chronic bronchitis.   Electronically Signed   By: Gordan Payment M.D.   On: 07/18/2013 01:02    EKG Interpretation     Ventricular Rate:  76 PR Interval:  172 QRS Duration: 86 QT Interval:  362 QTC Calculation: 407 R Axis:   5 Text Interpretation:  Normal sinus rhythm Septal infarct , age undetermined Abnormal ECG            MDM  No diagnosis found.  Differential diagnosis includes: ACS syndrome CHF exacerbation Valvular disorder Myocarditis Pericarditis Pericardial effusion Pneumonia Pleural effusion Pulmonary edema PE Anemia Musculoskeletal pain Dissection  HEART SCORE IS 4  History  Moderately suspicious 1   ECG  Normal 0   Age  57 - 65 years 1   Risk Factors  ? 3 risk factors or history of atherosclerotic disease  2   Troponin  ? normal limit 0   Pt has atypical chest pain - but  with mild suspicion. HEART score is 4. Last stress was 2 years ago. Will admit for ACS rule out.     Derwood Kaplan, MD 07/18/13 [phone]

## 2013-07-18 NOTE — H&P (Signed)
[facility] History and Physical  [name]:[id number] DOB: 06/22/1966 DOA: 07/17/2013  Referring physician: ED PCP: John Boga, MD  Chief Complaint: Chest pain  HPI: [name] is a 47 y.o. male who presents to the ED with chest pain.  He has had numerous ED visits and admissions in the past for similar chest pain (left sided, worse with movement and breathing, radiates to neck), over the past 15 years or so.  Numerous work ups including cath in 1999, and multiple cardiac stress tests (most recently in April of 2013) have all been negative for CAD.  Nothing seems to make the pain better, the patient presented to the ED.  Review of Systems: 12 systems reviewed and otherwise negative.  Past Medical History  Diagnosis Date  . B12 DEFICIENCY 03/06/2007  . CARDIOMYOPATHY, IDIOPATHIC HYPERTROPHIC 03/06/2007  . DEPRESSION 03/06/2007  . GERD 06/01/2007  . HYPERCHOLESTEROLEMIA 03/06/2007  . HYPERLIPIDEMIA 11/08/2008  . IMPAIRED GLUCOSE TOLERANCE 11/08/2008  . NEPHROLITHIASIS, HX OF 04/07/2010  . 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 OSTEOARTHRITIS 06/01/2007  . Palpitations 10/23/2007  . SYNDROME, CHRONIC PAIN 03/06/2007  . Shortness of breath   . Cancer     prostate ca  . Prostate cancer   . HYPERTENSION 03/06/2007    Dr. Amador Cunas  . Pneumonia     hx of  . Kidney stones     hx of , had surgery for removal  . Dysrhythmia     sees Dr. Jens Som for "palpitations"  . DVT (deep venous thrombosis)   . Anginal pain   . Anxiety   . SLEEP APNEA 03/06/2007    uses vpap, last sleep study 2011, Dr. Rhona Raider, at [facility]   Past Surgical History  Procedure Laterality Date  . Decompression and fusion      cervicothoracic  . Partial nephrectomy    . Cardiac catheterization    . Lumbar fusion    . Prostate surgery      robotic prostatectomy  . Penile prosthesis implant    . Tonsillectomy    . Leg skin lesion  biopsy / excision      left leg   Social History:  reports  that he quit smoking about 29 years ago. He has never used smokeless tobacco. He reports that he does not drink alcohol or use illicit drugs.   Allergies  Allergen Reactions  . Adhesive [Tape] Other (See Comments)    Skin irriation    Family History  Problem Relation Age of Onset  . Heart disease Neg Hx     family hx CHF  . Kidney disease Neg Hx     family hx   . Cancer Neg Hx     family hx prostate    Prior to Admission medications   Medication Sig Start Date End Date Taking? Authorizing Provider  albuterol (PROVENTIL HFA;VENTOLIN HFA) 108 (90 BASE) MCG/ACT inhaler Inhale 2 puffs into the lungs every 6 (six) hours as needed for wheezing or shortness of breath.    Yes Historical Provider, MD  amitriptyline (ELAVIL) 25 MG tablet Take 1 tablet (25 mg total) by mouth at bedtime. 09/12/12  Yes John Savers, MD  atenolol (TENORMIN) 25 MG tablet Take 25 mg by mouth 2 (two) times daily.   Yes Historical Provider, MD  atorvastatin (LIPITOR) 40 MG tablet Take 40 mg by mouth daily.   Yes Historical Provider, MD  azaTHIOprine (IMURAN) 50 MG tablet Take 50 mg by mouth 2 (two) times daily.  Yes Historical Provider, MD  buPROPion (WELLBUTRIN XL) 300 MG 24 hr tablet Take 300 mg by mouth daily.   Yes Historical Provider, MD  cyanocobalamin (COBAL-1000) 1000 MCG/ML injection Inject 1,000 mcg into the muscle every 30 (thirty) days.    Yes Historical Provider, MD  cycloSPORINE (RESTASIS) 0.05 % ophthalmic emulsion Place 1 drop into both eyes 2 (two) times daily.    Yes Historical Provider, MD  dicyclomine (BENTYL) 10 MG capsule Take 10 mg by mouth 4 (four) times daily.   Yes Historical Provider, MD  docusate sodium (COLACE) 100 MG capsule Take 100 mg by mouth daily.    Yes Historical Provider, MD  DULoxetine (CYMBALTA) 30 MG capsule Take 30 mg by mouth daily. Along with 60mg  to equal a 90mg  daily dose   Yes Historical Provider, MD  DULoxetine (CYMBALTA) 60 MG capsule Take 60 mg by mouth daily.  Along with 30mg  to =90mg    Yes Historical Provider, MD  esomeprazole (NEXIUM) 40 MG capsule Take 40 mg by mouth every evening.   Yes Historical Provider, MD  gabapentin (NEURONTIN) 300 MG capsule Take 900 mg by mouth 4 (four) times daily.    Yes Historical Provider, MD  guaiFENesin (MUCINEX) 600 MG 12 hr tablet Take 600 mg by mouth 2 (two) times daily.    Yes Historical Provider, MD  oxybutynin (DITROPAN-XL) 5 MG 24 hr tablet Take 5 mg by mouth daily.   Yes Historical Provider, MD  oxyCODONE-acetaminophen (PERCOCET) 10-325 MG per tablet Take 1 tablet by mouth every 4 (four) hours as needed for pain. 08/23/12  Yes John Loron, MD  predniSONE (DELTASONE) 5 MG tablet Take 5 mg by mouth daily.    Yes Historical Provider, MD  promethazine (PHENERGAN) 25 MG tablet Take 25 mg by mouth every 6 (six) hours as needed for nausea.    Yes Historical Provider, MD  traMADol (ULTRAM) 50 MG tablet Take 50 mg by mouth every 6 (six) hours as needed for pain.   Yes Historical Provider, MD   Physical Exam: Filed Vitals:   07/18/13 0501  BP: 115/71  Pulse: 74  Temp: 98.4 F (36.9 C)  Resp:      General:  NAD, resting comfortably in bed  Eyes: PEERLA EOMI  ENT: mucous membranes moist  Neck: supple w/o JVD  Cardiovascular: RRR w/o MRG  Respiratory: CTA B  Abdomen: soft, nt, nd, bs+  Skin: no rash nor lesion  Musculoskeletal: MAE, full ROM all 4 extremities  Psychiatric: normal tone and affect  Neurologic: AAOx3, grossly non-focal   Labs on Admission:  Basic Metabolic Panel:  Recent Labs Lab 07/17/13 2351  NA 138  K 3.9  CL 104  CO2 25  GLUCOSE 91  BUN 13  CREATININE 0.91  CALCIUM 9.0   Liver Function Tests: No results found for this basename: AST, ALT, ALKPHOS, BILITOT, PROT, ALBUMIN,  in the last 168 hours No results found for this basename: LIPASE, AMYLASE,  in the last 168 hours No results found for this basename: AMMONIA,  in the last 168 hours CBC:  Recent  Labs Lab 07/17/13 2351  WBC 3.3*  HGB 13.6  HCT 39.6  MCV 86.3  PLT 282   Cardiac Enzymes: No results found for this basename: CKTOTAL, CKMB, CKMBINDEX, TROPONINI,  in the last 168 hours  BNP (last 3 results)  Recent Labs  07/17/13 2351  PROBNP 32.7   CBG: No results found for this basename: GLUCAP,  in the last 168 hours  Radiological  Exams on Admission: Dg Chest 2 View  07/18/2013   CLINICAL DATA:  Chest pain.  EXAM: CHEST  2 VIEW  COMPARISON:  12/01/2012 and chest CTA dated 09/09/2012.  FINDINGS: The heart remains normal in size. Stable mildly prominent right hilum with previously demonstrated adenopathy. Stable hyperexpansion of the lungs and diffuse peribronchial thickening and accentuation of the interstitial markings. Cervical spine fixation hardware. Mild thoracic spine degenerative changes.  IMPRESSION: 1. No acute abnormality. 2. Stable mild right hilar adenopathy. 3. Stable mild changes of COPD and chronic bronchitis.   Electronically Signed   By: Gordan Payment M.D.   On: 07/18/2013 01:02    EKG: Independently reviewed.  Assessment/Plan Principal Problem:   Chest pain at rest Active Problems:   NEUROSARCOIDOSIS   SYNDROME, CHRONIC PAIN   CARDIOMYOPATHY, IDIOPATHIC HYPERTROPHIC   1. Chest pain - ddx includes 1. CAD - HEART score 2: while it is certainly possible that this time it is indeed CAD, the patient himself agrees that this does seem less likely, his only risk factor at this point is HTN, he has had numerous workups in the past over a period of 15 years all of which have been negative for CAD.  May wish to get another stress test if only to prevent readmission again in the near future since ED will likely readmit if he re presents with this. 2. Cardiac sarcoidosis - given that this patient has known neuro sarcoid and appears to have osteo sarcoid on MRI of his femur done last year, perhaps his idiopathic hypertrophic cardiomyopathy is actually cardiac sarcoid.   While cardiac sarcoidosis only occurs in a small percentage of sarcoid patients, certainly it is possible in this patient who has already had neurosarcoidosis in the past, it can present as hypertrophy of the septum (which this patient does have).    Code Status: Full Code (must indicate code status--if unknown or must be presumed, indicate so) Family Communication: Wife at bedside (indicate person spoken with, if applicable, with phone number if by telephone) Disposition Plan: Admit to inpatient (indicate anticipated LOS)  Time spent: 70 min  GARDNER, JARED M. [facility] Pager [phone]  If 7PM-7AM, please contact night-coverage [email or web address] Password [facility] 07/18/2013, 5:23 AM

## 2013-07-19 ENCOUNTER — Encounter (HOSPITAL_COMMUNITY)
Admission: EM | Disposition: A | Discharge status: Home / Self Care | Payer: Self-pay | Source: Home / Self Care | Attending: Emergency Medicine

## 2013-07-19 ENCOUNTER — Ambulatory Visit (HOSPITAL_COMMUNITY): Admission: RE | Admit: 2013-07-19 | Payer: Medicare Other | Source: Ambulatory Visit | Admitting: Internal Medicine

## 2013-07-19 DIAGNOSIS — I200 Unstable angina: Secondary | ICD-10-CM | POA: Diagnosis not present

## 2013-07-19 DIAGNOSIS — R079 Chest pain, unspecified: Secondary | ICD-10-CM | POA: Diagnosis not present

## 2013-07-19 DIAGNOSIS — K219 Gastro-esophageal reflux disease without esophagitis: Secondary | ICD-10-CM

## 2013-07-19 DIAGNOSIS — E785 Hyperlipidemia, unspecified: Secondary | ICD-10-CM

## 2013-07-19 DIAGNOSIS — D869 Sarcoidosis, unspecified: Secondary | ICD-10-CM | POA: Diagnosis not present

## 2013-07-19 HISTORY — PX: LEFT HEART CATHETERIZATION WITH CORONARY ANGIOGRAM: SHX5451

## 2013-07-19 LAB — CBC
HCT: 40.8 % (ref 39.0–52.0)
HCT: 41.5 % (ref 39.0–52.0)
Hemoglobin: 13.5 g/dL (ref 13.0–17.0)
Hemoglobin: 14.3 g/dL (ref 13.0–17.0)
MCH: 29 pg (ref 26.0–34.0)
MCH: 29.5 pg (ref 26.0–34.0)
MCHC: 33.1 g/dL (ref 30.0–36.0)
MCHC: 34.5 g/dL (ref 30.0–36.0)
MCV: 87.6 fL (ref 78.0–100.0)
MCV: 85.7 fL (ref 78.0–100.0)
Platelets: 265 10*3/uL (ref 150–400)
Platelets: 279 10*3/uL (ref 150–400)
RBC: 4.66 MIL/uL (ref 4.22–5.81)
RBC: 4.84 MIL/uL (ref 4.22–5.81)
RDW: 13.9 % (ref 11.5–15.5)
RDW: 13.8 % (ref 11.5–15.5)
WBC: 3.9 10*3/uL — ABNORMAL LOW (ref 4.0–10.5)
WBC: 3.7 10*3/uL — ABNORMAL LOW (ref 4.0–10.5)

## 2013-07-19 LAB — CREATININE, SERUM
Creatinine, Ser: 0.81 mg/dL (ref 0.50–1.35)
GFR calc Af Amer: >90 mL/min (ref >90)
GFR calc non Af Amer: >90 mL/min (ref >90)

## 2013-07-19 LAB — HEPARIN LEVEL (UNFRACTIONATED)
Heparin Unfractionated: 0.42 IU/mL (ref 0.30–0.70)
Heparin Unfractionated: 0.54 IU/mL (ref 0.30–0.70)

## 2013-07-19 LAB — PROTIME-INR
INR: 1.21 (ref 0.00–1.49)
Prothrombin Time: 15 seconds (ref 11.6–15.2)

## 2013-07-19 LAB — GLUCOSE, CAPILLARY: Glucose-Capillary: 92 mg/dL (ref 70–99)

## 2013-07-19 SURGERY — LEFT HEART CATHETERIZATION WITH CORONARY ANGIOGRAM
Anesthesia: LOCAL

## 2013-07-19 MED ORDER — VERAPAMIL HCL 2.5 MG/ML IV SOLN
INTRAVENOUS | Status: AC
  Filled 2013-07-19: qty 2

## 2013-07-19 MED ORDER — FENTANYL CITRATE 0.05 MG/ML IJ SOLN
INTRAMUSCULAR | Status: AC
  Filled 2013-07-19: qty 2

## 2013-07-19 MED ORDER — MIDAZOLAM HCL 2 MG/2ML IJ SOLN
INTRAMUSCULAR | Status: AC
  Filled 2013-07-19: qty 2

## 2013-07-19 MED ORDER — SODIUM CHLORIDE 0.9 % IV SOLN
INTRAVENOUS | Status: AC
  Administered 2013-07-19: 14:00:00 via INTRAVENOUS

## 2013-07-19 MED ORDER — SODIUM CHLORIDE 0.9 % IV SOLN
INTRAVENOUS | Status: DC
  Administered 2013-07-19: 11:00:00 via INTRAVENOUS

## 2013-07-19 MED ORDER — ACETAMINOPHEN 325 MG PO TABS: 325.0000 mg | ORAL_TABLET | Freq: Four times a day (QID) | ORAL | Status: DC | PRN

## 2013-07-19 MED ORDER — HEPARIN (PORCINE) IN NACL 2-0.9 UNIT/ML-% IJ SOLN
INTRAMUSCULAR | Status: AC
  Filled 2013-07-19: qty 1500

## 2013-07-19 MED ORDER — PANTOPRAZOLE SODIUM 40 MG PO TBEC: 40.0000 mg | DELAYED_RELEASE_TABLET | Freq: Two times a day (BID) | ORAL | Status: DC

## 2013-07-19 MED ORDER — NITROGLYCERIN 0.2 MG/ML ON CALL CATH LAB
INTRAVENOUS | Status: AC
  Filled 2013-07-19: qty 1

## 2013-07-19 MED ORDER — ACETAMINOPHEN 325 MG PO TABS: 650.0000 mg | ORAL_TABLET | ORAL | Status: DC | PRN

## 2013-07-19 MED ORDER — LIDOCAINE HCL (PF) 1 % IJ SOLN
INTRAMUSCULAR | Status: AC
  Filled 2013-07-19: qty 30

## 2013-07-19 MED ORDER — ENOXAPARIN SODIUM 40 MG/0.4ML ~~LOC~~ SOLN
40.0000 mg | SUBCUTANEOUS | Status: DC
  Filled 2013-07-19: qty 0.4

## 2013-07-19 MED ORDER — ONDANSETRON HCL 4 MG/2ML IJ SOLN: 4.0000 mg | Freq: Four times a day (QID) | INTRAMUSCULAR | Status: DC | PRN

## 2013-07-19 MED ORDER — HEPARIN SODIUM (PORCINE) 1000 UNIT/ML IJ SOLN
INTRAMUSCULAR | Status: AC
  Filled 2013-07-19: qty 1

## 2013-07-19 NOTE — Progress Notes (Signed)
[facility] PROGRESS NOTE  MOSE COLAIZZI ZOX:[id number] DOB: 03/02/1966 DOA: 07/17/2013 PCP: Rogelia Boga, MD  Assessment/Plan: 1. Chest Pain - atypical presentation, symptoms improving with NTG and morphine.  Cardiology consulted and pt is scheduled for CATH later today.  Further mgmt pending these results.  2. Idiopathic Hypertrophic Cardiomyopathy 3. Neurosarcodosis 4. Dyslipidemia - continue statin therapy  Code Status: Full   HPI/Subjective: Pt reports that his CP is 1-2 much better with nitroglycerin and morphine.    Objective: Filed Vitals:   07/19/13 0739  BP: 104/58  Pulse: 61  Temp: 98.2 F (36.8 C)  Resp: 16    Intake/Output Summary (Last 24 hours) at 07/19/13 1052 Last data filed at 07/19/13 0834  Gross per 24 hour  Intake 378.18 ml  Output   2625 ml  Net -2246.82 ml   Filed Weights   07/17/13 2352 07/18/13 0501 07/19/13 0557  Weight: 227 lb 4.8 oz (103.103 kg) 218 lb 9.6 oz (99.156 kg) 217 lb 1.6 oz (98.476 kg)    Exam:   General:  Awake, alert, no distress, cooperative  Cardiovascular: normal s1, s2 sounds  Respiratory: BBS clear to auscultation  Abdomen: soft, nondistended, nontender, no masses  Musculoskeletal: no CCE   Data Reviewed: Basic Metabolic Panel:  Recent Labs Lab 07/17/13 2351  NA 138  K 3.9  CL 104  CO2 25  GLUCOSE 91  BUN 13  CREATININE 0.91  CALCIUM 9.0   Liver Function Tests: No results found for this basename: AST, ALT, ALKPHOS, BILITOT, PROT, ALBUMIN,  in the last 168 hours No results found for this basename: LIPASE, AMYLASE,  in the last 168 hours No results found for this basename: AMMONIA,  in the last 168 hours CBC:  Recent Labs Lab 07/17/13 2351 07/19/13 0505  WBC 3.3* 3.9*  HGB 13.6 13.5  HCT 39.6 40.8  MCV 86.3 87.6  PLT 282 265   Cardiac Enzymes:  Recent Labs Lab 07/18/13 0855 07/18/13 1212 07/18/13 1853  TROPONINI <0.30 <0.30 <0.30   BNP (last 3 results)  Recent  Labs  07/17/13 2351  PROBNP 32.7   CBG: No results found for this basename: GLUCAP,  in the last 168 hours  No results found for this or any previous visit (from the past 240 hour(s)).   Studies: Dg Chest 2 View  07/18/2013   CLINICAL DATA:  Chest pain.  EXAM: CHEST  2 VIEW  COMPARISON:  12/01/2012 and chest CTA dated 09/09/2012.  FINDINGS: The heart remains normal in size. Stable mildly prominent right hilum with previously demonstrated adenopathy. Stable hyperexpansion of the lungs and diffuse peribronchial thickening and accentuation of the interstitial markings. Cervical spine fixation hardware. Mild thoracic spine degenerative changes.  IMPRESSION: 1. No acute abnormality. 2. Stable mild right hilar adenopathy. 3. Stable mild changes of COPD and chronic bronchitis.   Electronically Signed   By: Gordan Payment M.D.   On: 07/18/2013 01:02    Scheduled Meds: . amitriptyline  25 mg Oral QHS  . atenolol  25 mg Oral BID  . atorvastatin  40 mg Oral Daily  . azaTHIOprine  50 mg Oral BID  . buPROPion  300 mg Oral Daily  . cyanocobalamin  1,000 mcg Intramuscular Q30 days  . cycloSPORINE  1 drop Both Eyes BID  . dicyclomine  10 mg Oral QID  . docusate sodium  100 mg Oral Daily  . DULoxetine  90 mg Oral Daily  . gabapentin  900 mg Oral QID  . guaiFENesin  600 mg Oral BID  . influenza vac split quadrivalent PF  0.5 mL Intramuscular Tomorrow-1000  . nitroGLYCERIN  1 inch Topical Q6H  . oxybutynin  5 mg Oral Daily  . pantoprazole  80 mg Oral Q1200  . pneumococcal 23 valent vaccine  0.5 mL Intramuscular Tomorrow-1000  . predniSONE  5 mg Oral Daily  . sodium chloride  3 mL Intravenous Q12H   Continuous Infusions: . sodium chloride    . heparin 1,300 Units/hr (07/18/13 1813)    Principal Problem:   Chest pain at rest Active Problems:   NEUROSARCOIDOSIS   SYNDROME, CHRONIC PAIN   CARDIOMYOPATHY, IDIOPATHIC HYPERTROPHIC   Chas Axel [facility]  [facility] Pager [phone]. If  7PM-7AM, please contact night-coverage at [email or web address], password [facility] 07/19/2013, 10:52 AM  LOS: 2 days

## 2013-07-19 NOTE — Progress Notes (Signed)
ANTICOAGULATION CONSULT NOTE - Follow Up Consult  Pharmacy Consult for heparin Indication: chest pain/ACS  Labs:  Recent Labs  07/17/13 2351 07/18/13 0855 07/18/13 1212 07/18/13 1853 07/19/13 0004  HGB 13.6  --   --   --   --   HCT 39.6  --   --   --   --   PLT 282  --   --   --   --   LABPROT  --   --   --   --  15.0  INR  --   --   --   --  1.21  HEPARINUNFRC  --   --   --   --  0.42  CREATININE 0.91  --   --   --   --   TROPONINI  --  <0.30 <0.30 <0.30  --     Assessment/Plan:  47yo male therapeutic on heparin with initial dosing for CP.  Will continue gtt at current rate and confirm stable with additional level.  Vernard Gambles, PharmD, BCPS  07/19/2013,12:52 AM

## 2013-07-19 NOTE — Discharge Summary (Signed)
Physician Discharge Summary  YU PEGGS ZOX:[id number] DOB: 1966-08-14 DOA: 07/17/2013  PCP: Rogelia Boga, MD  Admit date: 07/17/2013 Discharge date: 07/19/2013  Recommendations for Outpatient Follow-up:  See your PCP in next week to discuss further evaluation of chest pain See a gastroenterologist as soon as possible to discuss evaluation of stomach discomfort Followup with rheumatologist and pulmonologist in next 2 weeks  Return if symptoms recur, worsen or new problems develop  Discharge Diagnoses:  Principal Problem:   Chest pain at rest Active Problems:   NEUROSARCOIDOSIS   SYNDROME, CHRONIC PAIN   CARDIOMYOPATHY, IDIOPATHIC HYPERTROPHIC   Discharge Condition: stable   Diet recommendation: heart healthy  Filed Weights   07/17/13 2352 07/18/13 0501 07/19/13 0557  Weight: 227 lb 4.8 oz (103.103 kg) 218 lb 9.6 oz (99.156 kg) 217 lb 1.6 oz (98.476 kg)    History of present illness:  [name] is a 47 y.o. male who presents to the ED with chest pain. He has had numerous ED visits and admissions in the past for similar chest pain (left sided, worse with movement and breathing, radiates to neck), over the past 15 years or so. Numerous work ups including cath in 1999, and multiple cardiac stress tests (most recently in April of 2013) have all been negative for CAD. Nothing seems to make the pain better, the patient presented to the ED.   Hospital Course:  Chest Pain: Requiring multiple ER visits Atypical features but help with nitro and MS. No acute ECG changes and negative enzymes Last myovue 4/13 normal  Favor diagnostic radial cath for definitive exclusion of CAD. He is likely to continue to come to ER/hospital without cath. Body habitus less favorable for CT  And repeat nuclear would not settle issue Discussed with patient willing to proceed:  Pt had cath done on 11/13 and negative for sig CAD.   Sarcoid: No need for repeat cardiac MRI no AV block on ECG  and done at [facility] per patient  Chol: continue statin   Procedures: Procedures performed:  1) Selective coronary angiography  2) Left heart catheterization  3) Left ventriculogram  Description of procedure:   The risks and indication of the procedure were explained. Consent was signed and placed on the chart. An appropriate timeout was taken prior to the procedure. After a normal Allen's test was confirmed, the right wrist was prepped and draped in the routine sterile fashion and anesthetized with 1% local lidocaine.  A 5 FR arterial sheath was then placed in the right radial artery using a modified Seldinger technique. Systemic heparin was administered. 3mg  IV verapamil was given through the sheath. Standard catheters including a JL 3.5, JR4 and straight pigtail were used. All catheter exchanges were made over a wire.  Complications: None apparent  Findings:  Ao Pressure: 96/64 (79)  LV Pressure: 102/3/11  There was no signficant gradient across the aortic valve on pullback.  Left main: Normal  LAD: Large vessel. Wraps apex. Normal  LCX: Co-dominant. Normal  RCA: Co-dominant. Normal  LV-gram done in the RAO projection: Ejection fraction = 50-55%. No wall motion abnormalities.  Assessment:  Noncardiac CP  Consultations:  cardiology  Discharge Instructions  Discharge Orders   Future Appointments Provider Department Dept Phone   07/27/2013 4:15 PM Gordy Savers, MD [facility] [place] [phone]   Future Orders Complete By Expires   Diet - low sodium heart healthy  As directed    Discharge instructions  As directed    Comments:  See your PCP in next week to discuss further evaluation of chest pain See a gastroenterologist as soon as possible to discuss evaluation of stomach discomfort Return if symptoms recur, worsen or new problems develop   Discontinue IV  As directed    Increase activity slowly  As directed        Medication List    STOP  taking these medications       esomeprazole 40 MG capsule  Commonly known as:  NEXIUM  Replaced by:  pantoprazole 40 MG tablet      TAKE these medications       acetaminophen 325 MG tablet  Commonly known as:  TYLENOL  Take 1 tablet (325 mg total) by mouth every 6 (six) hours as needed for mild pain, moderate pain or headache.     albuterol 108 (90 BASE) MCG/ACT inhaler  Commonly known as:  PROVENTIL HFA;VENTOLIN HFA  Inhale 2 puffs into the lungs every 6 (six) hours as needed for wheezing or shortness of breath.     amitriptyline 25 MG tablet  Commonly known as:  ELAVIL  Take 1 tablet (25 mg total) by mouth at bedtime.     atenolol 25 MG tablet  Commonly known as:  TENORMIN  Take 25 mg by mouth 2 (two) times daily.     atorvastatin 40 MG tablet  Commonly known as:  LIPITOR  Take 40 mg by mouth daily.     azaTHIOprine 50 MG tablet  Commonly known as:  IMURAN  Take 50 mg by mouth 2 (two) times daily.     buPROPion 300 MG 24 hr tablet  Commonly known as:  WELLBUTRIN XL  Take 300 mg by mouth daily.     COBAL-1000 1000 MCG/ML injection  Generic drug:  cyanocobalamin  Inject 1,000 mcg into the muscle every 30 (thirty) days.     cycloSPORINE 0.05 % ophthalmic emulsion  Commonly known as:  RESTASIS  Place 1 drop into both eyes 2 (two) times daily.     dicyclomine 10 MG capsule  Commonly known as:  BENTYL  Take 10 mg by mouth 4 (four) times daily.     docusate sodium 100 MG capsule  Commonly known as:  COLACE  Take 100 mg by mouth daily.     DULoxetine 60 MG capsule  Commonly known as:  CYMBALTA  Take 60 mg by mouth daily. Along with 30mg  to =90mg      DULoxetine 30 MG capsule  Commonly known as:  CYMBALTA  Take 30 mg by mouth daily. Along with 60mg  to equal a 90mg  daily dose     gabapentin 300 MG capsule  Commonly known as:  NEURONTIN  Take 900 mg by mouth 4 (four) times daily.     guaiFENesin 600 MG 12 hr tablet  Commonly known as:  MUCINEX  Take 600 mg by  mouth 2 (two) times daily.     oxybutynin 5 MG 24 hr tablet  Commonly known as:  DITROPAN-XL  Take 5 mg by mouth daily.     oxyCODONE-acetaminophen 10-325 MG per tablet  Commonly known as:  PERCOCET  Take 1 tablet by mouth every 4 (four) hours as needed for pain.     pantoprazole 40 MG tablet  Commonly known as:  PROTONIX  Take 1 tablet (40 mg total) by mouth 2 (two) times daily.     predniSONE 5 MG tablet  Commonly known as:  DELTASONE  Take 5 mg by mouth daily.  promethazine 25 MG tablet  Commonly known as:  PHENERGAN  Take 25 mg by mouth every 6 (six) hours as needed for nausea.     traMADol 50 MG tablet  Commonly known as:  ULTRAM  Take 50 mg by mouth every 6 (six) hours as needed for pain.       Allergies  Allergen Reactions  . Adhesive [Tape] Other (See Comments)    Skin irriation       Follow-up Information   Follow up with Rogelia Boga, MD. Schedule an appointment as soon as possible for a visit in 1 week.   Specialty:  Internal Medicine   Contact information:   [address] [place] [address] [phone]       Follow up with [facility], MD. Schedule an appointment as soon as possible for a visit in 2 weeks.   Specialty:  Pulmonary Disease   Contact information:   [address] [facility] [phone]        The results of significant diagnostics from this hospitalization (including imaging, microbiology, ancillary and laboratory) are listed below for reference.    Significant Diagnostic Studies: Dg Chest 2 View  07/18/2013   CLINICAL DATA:  Chest pain.  EXAM: CHEST  2 VIEW  COMPARISON:  12/01/2012 and chest CTA dated 09/09/2012.  FINDINGS: The heart remains normal in size. Stable mildly prominent right hilum with previously demonstrated adenopathy. Stable hyperexpansion of the lungs and diffuse peribronchial thickening and accentuation of the interstitial markings. Cervical spine fixation hardware. Mild thoracic spine  degenerative changes.  IMPRESSION: 1. No acute abnormality. 2. Stable mild right hilar adenopathy. 3. Stable mild changes of COPD and chronic bronchitis.   Electronically Signed   By: Gordan Payment M.D.   On: 07/18/2013 01:02    Microbiology: No results found for this or any previous visit (from the past 240 hour(s)).   Labs: Basic Metabolic Panel:  Recent Labs Lab 07/17/13 2351 07/19/13 1430  NA 138  --   K 3.9  --   CL 104  --   CO2 25  --   GLUCOSE 91  --   BUN 13  --   CREATININE 0.91 0.81  CALCIUM 9.0  --    Liver Function Tests: No results found for this basename: AST, ALT, ALKPHOS, BILITOT, PROT, ALBUMIN,  in the last 168 hours No results found for this basename: LIPASE, AMYLASE,  in the last 168 hours No results found for this basename: AMMONIA,  in the last 168 hours CBC:  Recent Labs Lab 07/17/13 2351 07/19/13 0505 07/19/13 1430  WBC 3.3* 3.9* 3.7*  HGB 13.6 13.5 14.3  HCT 39.6 40.8 41.5  MCV 86.3 87.6 85.7  PLT 282 265 279   Cardiac Enzymes:  Recent Labs Lab 07/18/13 0855 07/18/13 1212 07/18/13 1853  TROPONINI <0.30 <0.30 <0.30   BNP: BNP (last 3 results)  Recent Labs  07/17/13 2351  PROBNP 32.7   CBG:  Recent Labs Lab 07/19/13 1124  GLUCAP 92   Signed:  Clanford Johnson  [facility] 07/19/2013, 6:49 PM

## 2013-07-19 NOTE — Progress Notes (Signed)
   Subjective:  Chest discomfort is less severe this am.He feels that the nitroglycerine ointment has helped. NPO for cath today.  Objective:  Vital Signs in the last 24 hours: Temp:  [98 F (36.7 C)-98.5 F (36.9 C)] 98.2 F (36.8 C) (11/13 0739) Pulse Rate:  [56-84] 61 (11/13 0739) Resp:  [16-18] 16 (11/13 0739) BP: (100-119)/(48-72) 104/58 mmHg (11/13 0739) SpO2:  [97 %-100 %] 97 % (11/13 0739) Weight:  [217 lb 1.6 oz (98.476 kg)] 217 lb 1.6 oz (98.476 kg) (11/13 0557)  Intake/Output from previous day: 11/12 0701 - 11/13 0700 In: 360 [P.O.:360] Out: 2625 [Urine:2625] Intake/Output from this shift:    . amitriptyline  25 mg Oral QHS  . atenolol  25 mg Oral BID  . atorvastatin  40 mg Oral Daily  . azaTHIOprine  50 mg Oral BID  . buPROPion  300 mg Oral Daily  . cyanocobalamin  1,000 mcg Intramuscular Q30 days  . cycloSPORINE  1 drop Both Eyes BID  . dicyclomine  10 mg Oral QID  . docusate sodium  100 mg Oral Daily  . DULoxetine  90 mg Oral Daily  . gabapentin  900 mg Oral QID  . guaiFENesin  600 mg Oral BID  . influenza vac split quadrivalent PF  0.5 mL Intramuscular Tomorrow-1000  . nitroGLYCERIN  1 inch Topical Q6H  . oxybutynin  5 mg Oral Daily  . pantoprazole  80 mg Oral Q1200  . pneumococcal 23 valent vaccine  0.5 mL Intramuscular Tomorrow-1000  . predniSONE  5 mg Oral Daily  . sodium chloride  3 mL Intravenous Q12H   . sodium chloride    . heparin 1,300 Units/hr (07/18/13 1813)    Physical Exam: The patient appears to be in no distress.  Head and neck exam reveals that the pupils are equal and reactive.  The extraocular movements are full.  There is no scleral icterus.  Mouth and pharynx are benign.  No lymphadenopathy.  No carotid bruits.  The jugular venous pressure is normal.  Thyroid is not enlarged or tender.  Chest is clear to percussion and auscultation.  No rales or rhonchi.  Expansion of the chest is symmetrical.  Heart reveals no abnormal lift  or heave.  First and second heart sounds are normal.  There is no murmur gallop rub or click.  The abdomen is soft and nontender.  Bowel sounds are normoactive.  There is no hepatosplenomegaly or mass.  There are no abdominal bruits.  Extremities reveal no phlebitis or edema.  Pedal pulses are good.  There is no cyanosis or clubbing.  Neurologic exam is normal strength and no lateralizing weakness.  No sensory deficits.  Integument reveals no rash  Lab Results:  Recent Labs  07/17/13 2351 07/19/13 0505  WBC 3.3* 3.9*  HGB 13.6 13.5  PLT 282 265    Recent Labs  07/17/13 2351  NA 138  K 3.9  CL 104  CO2 25  GLUCOSE 91  BUN 13  CREATININE 0.91    Recent Labs  07/18/13 1212 07/18/13 1853  TROPONINI <0.30 <0.30   Hepatic Function Panel No results found for this basename: PROT, ALBUMIN, AST, ALT, ALKPHOS, BILITOT, BILIDIR, IBILI,  in the last 72 hours No results found for this basename: CHOL,  in the last 72 hours No results found for this basename: PROTIME,  in the last 72 hours  Imaging: Dg Chest 2 View  07/18/2013   CLINICAL DATA:  Chest pain.  EXAM: CHEST    2 VIEW  COMPARISON:  12/01/2012 and chest CTA dated 09/09/2012.  FINDINGS: The heart remains normal in size. Stable mildly prominent right hilum with previously demonstrated adenopathy. Stable hyperexpansion of the lungs and diffuse peribronchial thickening and accentuation of the interstitial markings. Cervical spine fixation hardware. Mild thoracic spine degenerative changes.  IMPRESSION: 1. No acute abnormality. 2. Stable mild right hilar adenopathy. 3. Stable mild changes of COPD and chronic bronchitis.   Electronically Signed   By: Steve  Reid M.D.   On: 07/18/2013 01:02    Cardiac Studies: Telemetry shows NSR Assessment/Plan:  Chest Pain: Requiring multiple ER visits Atypical features but help with nitro and MS. No acute ECG changes and negative enzymes Last myovue 4/13 normal  Favor diagnostic radial cath  for definitive exclusion of CAD. He is likely to continue to come to ER/hospital without cath. Body habitus less favorable for CT  And repeat nuclear would not settle issue Discussed with patient willing to proceed  Sarcoid: No need for repeat cardiac MRI no AV block on ECG and done at [facility] per patient  Chol: continue statin   Plan: Cath today.  LOS: 2 days    Kassady Laboy 07/19/2013, 7:47 AM    

## 2013-07-19 NOTE — Progress Notes (Signed)
ANTICOAGULATION CONSULT NOTE - Follow Up Consult  Pharmacy Consult for heparin Indication: chest pain/ACS  Labs:  Recent Labs  07/17/13 2351 07/18/13 0855 07/18/13 1212 07/18/13 1853 07/19/13 0004 07/19/13 0505 07/19/13 0831  HGB 13.6  --   --   --   --  13.5  --   HCT 39.6  --   --   --   --  40.8  --   PLT 282  --   --   --   --  265  --   LABPROT  --   --   --   --  15.0  --   --   INR  --   --   --   --  1.21  --   --   HEPARINUNFRC  --   --   --   --  0.42  --  0.54  CREATININE 0.91  --   --   --   --   --   --   TROPONINI  --  <0.30 <0.30 <0.30  --   --   --     Assessment 47yo male therapeutic on heparin w for CP.  Heparin is at 1300 units/hr and noted at goal (HL= 0.54). Patient noted for cath today.  Goal:  Heparin level= 0.3-0.7 Monitor platelets by anticoagulation protocol: Yes   Plan -No heparin changes needed -Will follow post cath  Harland German, Pharm D 07/19/2013 9:59 AM

## 2013-07-19 NOTE — CV Procedure (Signed)
Cardiac Cath Procedure Note:  Indication: [place]  Procedures performed:  1) Selective coronary angiography 2) Left heart catheterization 3) Left ventriculogram  Description of procedure:   The risks and indication of the procedure were explained. Consent was signed and placed on the chart. An appropriate timeout was taken prior to the procedure. After a normal Allen's test was confirmed, the right wrist was prepped and draped in the routine sterile fashion and anesthetized with 1% local lidocaine.   A 5 FR arterial sheath was then placed in the right radial artery using a modified Seldinger technique. Systemic heparin was administered. 3mg  IV verapamil was given through the sheath. Standard catheters including a JL 3.5, JR4 and straight pigtail were used. All catheter exchanges were made over a wire.  Complications:  None apparent  Findings:  Ao Pressure: 96/64 (79) LV Pressure: 102/3/11 There was no signficant gradient across the aortic valve on pullback.  Left main: Normal  LAD: Large vessel. Wraps apex. Normal  LCX: Co-dominant. Normal  RCA: Co-dominant. Normal  LV-gram done in the RAO projection: Ejection fraction = 50-55%. No wall motion abnormalities.   Assessment:  Noncardiac CP  Plan/Discussion:  Can go home later today from our standpoint. Continue RF management. Work-up for other sources of CP per primary team.   Arvilla Meres 1:40 PM

## 2013-07-19 NOTE — H&P (View-Only) (Signed)
Subjective:  Chest discomfort is less severe this am.He feels that the nitroglycerine ointment has helped. NPO for cath today.  Objective:  Vital Signs in the last 24 hours: Temp:  [98 F (36.7 C)-98.5 F (36.9 C)] 98.2 F (36.8 C) (11/13 0739) Pulse Rate:  [56-84] 61 (11/13 0739) Resp:  [16-18] 16 (11/13 0739) BP: (100-119)/(48-72) 104/58 mmHg (11/13 0739) SpO2:  [97 %-100 %] 97 % (11/13 0739) Weight:  [217 lb 1.6 oz (98.476 kg)] 217 lb 1.6 oz (98.476 kg) (11/13 0557)  Intake/Output from previous day: 11/12 0701 - 11/13 0700 In: 360 [P.O.:360] Out: 2625 [Urine:2625] Intake/Output from this shift:    . amitriptyline  25 mg Oral QHS  . atenolol  25 mg Oral BID  . atorvastatin  40 mg Oral Daily  . azaTHIOprine  50 mg Oral BID  . buPROPion  300 mg Oral Daily  . cyanocobalamin  1,000 mcg Intramuscular Q30 days  . cycloSPORINE  1 drop Both Eyes BID  . dicyclomine  10 mg Oral QID  . docusate sodium  100 mg Oral Daily  . DULoxetine  90 mg Oral Daily  . gabapentin  900 mg Oral QID  . guaiFENesin  600 mg Oral BID  . influenza vac split quadrivalent PF  0.5 mL Intramuscular Tomorrow-1000  . nitroGLYCERIN  1 inch Topical Q6H  . oxybutynin  5 mg Oral Daily  . pantoprazole  80 mg Oral Q1200  . pneumococcal 23 valent vaccine  0.5 mL Intramuscular Tomorrow-1000  . predniSONE  5 mg Oral Daily  . sodium chloride  3 mL Intravenous Q12H   . sodium chloride    . heparin 1,300 Units/hr (07/18/13 1813)    Physical Exam: The patient appears to be in no distress.  Head and neck exam reveals that the pupils are equal and reactive.  The extraocular movements are full.  There is no scleral icterus.  Mouth and pharynx are benign.  No lymphadenopathy.  No carotid bruits.  The jugular venous pressure is normal.  Thyroid is not enlarged or tender.  Chest is clear to percussion and auscultation.  No rales or rhonchi.  Expansion of the chest is symmetrical.  Heart reveals no abnormal lift  or heave.  First and second heart sounds are normal.  There is no murmur gallop rub or click.  The abdomen is soft and nontender.  Bowel sounds are normoactive.  There is no hepatosplenomegaly or mass.  There are no abdominal bruits.  Extremities reveal no phlebitis or edema.  Pedal pulses are good.  There is no cyanosis or clubbing.  Neurologic exam is normal strength and no lateralizing weakness.  No sensory deficits.  Integument reveals no rash  Lab Results:  Recent Labs  07/17/13 2351 07/19/13 0505  WBC 3.3* 3.9*  HGB 13.6 13.5  PLT 282 265    Recent Labs  07/17/13 2351  NA 138  K 3.9  CL 104  CO2 25  GLUCOSE 91  BUN 13  CREATININE 0.91    Recent Labs  07/18/13 1212 07/18/13 1853  TROPONINI <0.30 <0.30   Hepatic Function Panel No results found for this basename: PROT, ALBUMIN, AST, ALT, ALKPHOS, BILITOT, BILIDIR, IBILI,  in the last 72 hours No results found for this basename: CHOL,  in the last 72 hours No results found for this basename: PROTIME,  in the last 72 hours  Imaging: Dg Chest 2 View  07/18/2013   CLINICAL DATA:  Chest pain.  EXAM: CHEST  2 VIEW  COMPARISON:  12/01/2012 and chest CTA dated 09/09/2012.  FINDINGS: The heart remains normal in size. Stable mildly prominent right hilum with previously demonstrated adenopathy. Stable hyperexpansion of the lungs and diffuse peribronchial thickening and accentuation of the interstitial markings. Cervical spine fixation hardware. Mild thoracic spine degenerative changes.  IMPRESSION: 1. No acute abnormality. 2. Stable mild right hilar adenopathy. 3. Stable mild changes of COPD and chronic bronchitis.   Electronically Signed   By: Gordan Payment M.D.   On: 07/18/2013 01:02    Cardiac Studies: Telemetry shows NSR Assessment/Plan:  Chest Pain: Requiring multiple ER visits Atypical features but help with nitro and MS. No acute ECG changes and negative enzymes Last myovue 4/13 normal  Favor diagnostic radial cath  for definitive exclusion of CAD. He is likely to continue to come to ER/hospital without cath. Body habitus less favorable for CT  And repeat nuclear would not settle issue Discussed with patient willing to proceed  Sarcoid: No need for repeat cardiac MRI no AV block on ECG and done at [facility] per patient  Chol: continue statin   Plan: Cath today.  LOS: 2 days    Cassell Clement 07/19/2013, 7:47 AM

## 2013-07-19 NOTE — Interval H&P Note (Signed)
Cath Lab Visit (complete for each Cath Lab visit)  Clinical Evaluation Leading to the Procedure:   ACS: yes  Non-ACS:    Anginal Classification: CCS III  Anti-ischemic medical therapy: Maximal Therapy (2 or more classes of medications)  Non-Invasive Test Results: No non-invasive testing performed  Prior CABG: No previous CABG      History and Physical Interval Note:  07/19/2013 1:21 PM  [name]  has presented today for surgery, with the diagnosis of Chest pain  The various methods of treatment have been discussed with the patient and family. After consideration of risks, benefits and other options for treatment, the patient has consented to  Procedure(s): LEFT HEART CATHETERIZATION WITH CORONARY ANGIOGRAM (N/A) as a surgical intervention .  The patient's history has been reviewed, patient examined, no change in status, stable for surgery.  I have reviewed the patient's chart and labs.  Questions were answered to the patient's satisfaction.     Daniel Bensimhon

## 2013-07-21 ENCOUNTER — Other Ambulatory Visit: Payer: Self-pay | Admitting: Internal Medicine

## 2013-07-23 DIAGNOSIS — R209 Unspecified disturbances of skin sensation: Secondary | ICD-10-CM | POA: Diagnosis not present

## 2013-07-23 DIAGNOSIS — M961 Postlaminectomy syndrome, not elsewhere classified: Secondary | ICD-10-CM | POA: Diagnosis not present

## 2013-07-23 DIAGNOSIS — M549 Dorsalgia, unspecified: Secondary | ICD-10-CM | POA: Diagnosis not present

## 2013-07-23 DIAGNOSIS — IMO0002 Reserved for concepts with insufficient information to code with codable children: Secondary | ICD-10-CM | POA: Diagnosis not present

## 2013-07-24 ENCOUNTER — Other Ambulatory Visit: Payer: Self-pay | Admitting: Internal Medicine

## 2013-07-25 ENCOUNTER — Telehealth: Payer: Self-pay | Admitting: Internal Medicine

## 2013-07-25 NOTE — Telephone Encounter (Signed)
Pharmacy calling for clarification of rx written for Cymbalta.  They received a request for 30 mg and another request for 60 mg.  Please call back and advise which is the correct dosage.

## 2013-07-26 ENCOUNTER — Encounter: Payer: Self-pay | Admitting: *Deleted

## 2013-07-26 NOTE — Telephone Encounter (Signed)
Spoke to pt asked him if he is taking Cymbalta 30 mg and 60 mg both or just one. Pt said he is taking both and the pharmacy called him last night to clarify it. Told him okay thank you I was not here yesterday afternoon.

## 2013-07-27 ENCOUNTER — Encounter: Payer: Self-pay | Admitting: Internal Medicine

## 2013-07-27 ENCOUNTER — Ambulatory Visit (INDEPENDENT_AMBULATORY_CARE_PROVIDER_SITE_OTHER): Payer: Medicare Other | Admitting: Internal Medicine

## 2013-07-27 VITALS — BP 130/80 | HR 76 | Temp 98.1°F | Resp 20 | Wt 233.0 lb

## 2013-07-27 DIAGNOSIS — D869 Sarcoidosis, unspecified: Secondary | ICD-10-CM

## 2013-07-27 DIAGNOSIS — Z23 Encounter for immunization: Secondary | ICD-10-CM

## 2013-07-27 DIAGNOSIS — I10 Essential (primary) hypertension: Secondary | ICD-10-CM

## 2013-07-27 DIAGNOSIS — I428 Other cardiomyopathies: Secondary | ICD-10-CM | POA: Diagnosis not present

## 2013-07-27 DIAGNOSIS — G894 Chronic pain syndrome: Secondary | ICD-10-CM

## 2013-07-27 DIAGNOSIS — E785 Hyperlipidemia, unspecified: Secondary | ICD-10-CM

## 2013-07-27 DIAGNOSIS — R079 Chest pain, unspecified: Secondary | ICD-10-CM

## 2013-07-27 NOTE — Progress Notes (Signed)
Pre-visit discussion using our clinic review tool. No additional management support is needed unless otherwise documented below in the visit note.  

## 2013-07-27 NOTE — Patient Instructions (Signed)
Limit your sodium (Salt) intake    It is important that you exercise regularly, at least 20 minutes 3 to 4 times per week.  If you develop chest pain or shortness of breath seek  medical attention.  You need to lose weight.  Consider a lower calorie diet and regular exercise.  Return in 6 months for follow-up   

## 2013-07-27 NOTE — Progress Notes (Signed)
Subjective:    Patient ID: [name], male    DOB: 02/27/1966, 47 y.o.   MRN: [id number]  HPI Pre-visit discussion using our clinic review tool. No additional management support is needed unless otherwise documented below in the visit note.  47 year old patient who is seen following a recent hospital discharge. He was readmitted for evaluation of chest pain which has been an intermittent problem for years. Evaluation included a second heart catheterization which revealed normal coronary arteries. Since his discharge 8 days ago he has done quite well. He is also being followed by chronic pain clinic and is being considered for a neurostimulator in the near future. He remains on chronic narcotic analgesics. Today he feels well and is comfortable. History of hypertension and dyslipidemia. He has a history of sarcoidosis and is scheduled for followup at [facility] with both pulmonary and GI  Admit date: 07/17/2013  Discharge date: 07/19/2013  Recommendations for Outpatient Follow-up:  See your PCP in next week to discuss further evaluation of chest pain  See a gastroenterologist as soon as possible to discuss evaluation of stomach discomfort  Followup with rheumatologist and pulmonologist in next 2 weeks  Return if symptoms recur, worsen or new problems develop  Discharge Diagnoses:  Principal Problem:  Chest pain at rest  Active Problems:  NEUROSARCOIDOSIS  SYNDROME, CHRONIC PAIN  CARDIOMYOPATHY, IDIOPATHIC HYPERTROPHIC  Discharge Condition: stable  Diet recommendation: heart healthy  Filed Weights    07/17/13 2352  07/18/13 0501  07/19/13 0557   Weight:  227 lb 4.8 oz (103.103 kg)  218 lb 9.6 oz (99.156 kg)  217 lb 1.6 oz (98.476 kg)   History of present illness:  [name] is a 47 y.o. male who presents to the ED with chest pain. He has had numerous ED visits and admissions in the past for similar chest pain (left sided, worse with movement and breathing, radiates to  neck), over the past 15 years or so. Numerous work ups including cath in 1999, and multiple cardiac stress tests (most recently in April of 2013) have all been negative for CAD. Nothing seems to make the pain better, the patient presented to the ED.  Hospital Course:  Chest Pain: Requiring multiple ER visits Atypical features but help with nitro and MS. No acute ECG changes and negative enzymes Last myovue 4/13 normal  Favor diagnostic radial cath for definitive exclusion of CAD. He is likely to continue to come to ER/hospital without cath. Body habitus less favorable for CT  And repeat nuclear would not settle issue Discussed with patient willing to proceed: Pt had cath done on 11/13 and negative for sig CAD.  Sarcoid: No need for repeat cardiac MRI no AV block on ECG and done at [facility] per patient  Chol: continue statin  Procedures:  Procedures performed:  1) Selective coronary angiography  2) Left heart catheterization  3) Left ventriculogram  Description of procedure:   Past Medical History  Diagnosis Date  . B12 DEFICIENCY 03/06/2007  . CARDIOMYOPATHY, IDIOPATHIC HYPERTROPHIC 03/06/2007  . DEPRESSION 03/06/2007  . GERD 06/01/2007  . HYPERCHOLESTEROLEMIA 03/06/2007  . HYPERLIPIDEMIA 11/08/2008  . IMPAIRED GLUCOSE TOLERANCE 11/08/2008  . NEPHROLITHIASIS, HX OF 04/07/2010  . 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 OSTEOARTHRITIS 06/01/2007  . Palpitations 10/23/2007  . SYNDROME, CHRONIC PAIN 03/06/2007  . Shortness of breath   . Cancer     prostate ca  . Prostate cancer   . HYPERTENSION 03/06/2007    Dr. Amador Cunas  . Pneumonia  hx of  . Kidney stones     hx of , had surgery for removal  . Dysrhythmia     sees Dr. Jens Som for "palpitations"  . DVT (deep venous thrombosis)   . Anginal pain   . Anxiety   . SLEEP APNEA 03/06/2007    uses vpap, last sleep study 2011, Dr. Rhona Raider, at [facility]    History   Social History  . Marital Status: Married    Spouse Name: N/A    Number of  Children: N/A  . 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 Smoking status: Former Smoker    Quit date: 09/07/1983  . Smokeless tobacco: Never Used  . Alcohol Use: No  . Drug Use: No  . Sexual Activity: Yes   Other Topics Concern  . Not on file   Social History Narrative  . No narrative on file    Past Surgical History  Procedure Laterality Date  . Decompression and fusion      cervicothoracic  . Partial nephrectomy    . Cardiac catheterization    . Lumbar fusion    . Prostate surgery      robotic prostatectomy  . Penile prosthesis implant    . Tonsillectomy    . Leg skin lesion  biopsy / excision      left leg    Family History  Problem Relation Age of Onset  . Heart disease Neg Hx     family hx CHF  . Kidney disease Neg Hx     family hx   . Cancer Neg Hx     family hx prostate    Allergies  Allergen Reactions  . Adhesive [Tape] Other (See Comments)    Skin irriation    Current Outpatient Prescriptions on File Prior to Visit  Medication Sig Dispense Refill  . acetaminophen (TYLENOL) 325 MG tablet Take 1 tablet (325 mg total) by mouth every 6 (six) hours as needed for mild pain, moderate pain or headache.      . albuterol (PROVENTIL HFA;VENTOLIN HFA) 108 (90 BASE) MCG/ACT inhaler Inhale 2 puffs into the lungs every 6 (six) hours as needed for wheezing or shortness of breath.       Marland Kitchen amitriptyline (ELAVIL) 25 MG tablet Take 1 tablet (25 mg total) by mouth at bedtime.  30 tablet  2  . atenolol (TENORMIN) 25 MG tablet Take 25 mg by mouth 2 (two) times daily.      Marland Kitchen atorvastatin (LIPITOR) 40 MG tablet Take 40 mg by mouth daily.      Marland Kitchen azaTHIOprine (IMURAN) 50 MG tablet Take 50 mg by mouth 2 (two) times daily.       Marland Kitchen buPROPion (WELLBUTRIN XL) 300 MG 24 hr tablet Take 300 mg by mouth daily.      . cyanocobalamin (COBAL-1000) 1000 MCG/ML injection Inject 1,000 mcg into the muscle every 30 (thirty) days.        . cycloSPORINE (RESTASIS) 0.05 % ophthalmic emulsion Place 1 drop into both eyes 2 (two) times daily.       Marland Kitchen dicyclomine (BENTYL) 10 MG capsule Take 10 mg by mouth 4 (four) times daily.      Marland Kitchen docusate sodium (COLACE) 100 MG capsule Take 100 mg by mouth daily.       . DULoxetine (CYMBALTA) 30 MG capsule TAKE ONE CAPSULE BY MOUTH DAILY  90 capsule  2  . DULoxetine (  CYMBALTA) 60 MG capsule Take 60 mg by mouth daily. Along with 30mg  to =90mg       . gabapentin (NEURONTIN) 300 MG capsule Take 900 mg by mouth 4 (four) times daily.       Marland Kitchen guaiFENesin (MUCINEX) 600 MG 12 hr tablet Take 600 mg by mouth 2 (two) times daily.       Marland Kitchen oxybutynin (DITROPAN-XL) 5 MG 24 hr tablet Take 5 mg by mouth daily.      Marland Kitchen oxyCODONE-acetaminophen (PERCOCET) 10-325 MG per tablet Take 1 tablet by mouth every 4 (four) hours as needed for pain.  100 tablet  0  . predniSONE (DELTASONE) 5 MG tablet Take 5 mg by mouth daily.       . promethazine (PHENERGAN) 25 MG tablet Take 25 mg by mouth every 6 (six) hours as needed for nausea.       . traMADol (ULTRAM) 50 MG tablet Take 50 mg by mouth every 6 (six) hours as needed for pain.      . traMADol (ULTRAM) 50 MG tablet TAKE ONE TABLET BY MOUTH EVERY 6 HOURS AS NEEDED FOR PAIN  90 tablet  1  . pantoprazole (PROTONIX) 40 MG tablet Take 1 tablet (40 mg total) by mouth 2 (two) times daily.  60 tablet  0   No current facility-administered medications on file prior to visit.    BP 130/80  Pulse 76  Temp(Src) 98.1 F (36.7 C) (Oral)  Resp 20  Wt 233 lb (105.688 kg)  SpO2 97%       Review of Systems  Constitutional: Negative for fever, chills, appetite change and fatigue.  HENT: Negative for congestion, dental problem, ear pain, hearing loss, sore throat, tinnitus, trouble swallowing and voice change.   Eyes: Negative for pain, discharge and visual disturbance.  Respiratory: Negative for cough, chest tightness, wheezing and stridor.   Cardiovascular: Positive for chest  pain. Negative for palpitations and leg swelling.  Gastrointestinal: Negative for nausea, vomiting, abdominal pain, diarrhea, constipation, blood in stool and abdominal distention.  Genitourinary: Negative for urgency, hematuria, flank pain, discharge, difficulty urinating and genital sores.  Musculoskeletal: Negative for arthralgias, back pain, gait problem, joint swelling, myalgias and neck stiffness.  Skin: Negative for rash.  Neurological: Negative for dizziness, syncope, speech difficulty, weakness, numbness and headaches.  Hematological: Negative for adenopathy. Does not bruise/bleed easily.  Psychiatric/Behavioral: Negative for behavioral problems and dysphoric mood. The patient is not nervous/anxious.        Objective:   Physical Exam  Constitutional: He is oriented to person, place, and time. He appears well-developed.  Comfortable no distress  HENT:  Head: Normocephalic.  Right Ear: External ear normal.  Left Ear: External ear normal.  Eyes: Conjunctivae and EOM are normal.  Neck: Normal range of motion.  Cardiovascular: Normal rate and normal heart sounds.   Right radial artery appeared normal  Pulmonary/Chest: Breath sounds normal.  Abdominal: Bowel sounds are normal.  Musculoskeletal: Normal range of motion. He exhibits no edema and no tenderness.  Neurological: He is alert and oriented to person, place, and time.  Psychiatric: He has a normal mood and affect. His behavior is normal.          Assessment & Plan:   Chest pain syndrome Status post cardiac catheterization Chronic pain syndrome Sarcoidosis Hypertension  We'll followup with GI pulmonary medicine and rheumatology Followup with chronic pain clinic  Recheck here in 6 months or as needed

## 2013-07-31 DIAGNOSIS — M6281 Muscle weakness (generalized): Secondary | ICD-10-CM | POA: Diagnosis not present

## 2013-07-31 DIAGNOSIS — M542 Cervicalgia: Secondary | ICD-10-CM | POA: Diagnosis not present

## 2013-08-01 DIAGNOSIS — M542 Cervicalgia: Secondary | ICD-10-CM | POA: Diagnosis not present

## 2013-08-01 DIAGNOSIS — M6281 Muscle weakness (generalized): Secondary | ICD-10-CM | POA: Diagnosis not present

## 2013-08-06 DIAGNOSIS — M542 Cervicalgia: Secondary | ICD-10-CM | POA: Diagnosis not present

## 2013-08-06 DIAGNOSIS — M6281 Muscle weakness (generalized): Secondary | ICD-10-CM | POA: Diagnosis not present

## 2013-08-07 DIAGNOSIS — F329 Major depressive disorder, single episode, unspecified: Secondary | ICD-10-CM | POA: Diagnosis not present

## 2013-08-07 DIAGNOSIS — R079 Chest pain, unspecified: Secondary | ICD-10-CM | POA: Diagnosis not present

## 2013-08-07 DIAGNOSIS — D869 Sarcoidosis, unspecified: Secondary | ICD-10-CM | POA: Diagnosis not present

## 2013-08-07 DIAGNOSIS — Z79899 Other long term (current) drug therapy: Secondary | ICD-10-CM | POA: Insufficient documentation

## 2013-08-07 DIAGNOSIS — G4733 Obstructive sleep apnea (adult) (pediatric): Secondary | ICD-10-CM | POA: Diagnosis not present

## 2013-08-08 DIAGNOSIS — IMO0002 Reserved for concepts with insufficient information to code with codable children: Secondary | ICD-10-CM | POA: Diagnosis not present

## 2013-08-23 DIAGNOSIS — F331 Major depressive disorder, recurrent, moderate: Secondary | ICD-10-CM | POA: Diagnosis not present

## 2013-08-23 DIAGNOSIS — F4542 Pain disorder with related psychological factors: Secondary | ICD-10-CM | POA: Diagnosis not present

## 2013-08-27 DIAGNOSIS — F331 Major depressive disorder, recurrent, moderate: Secondary | ICD-10-CM | POA: Diagnosis not present

## 2013-08-27 DIAGNOSIS — F4542 Pain disorder with related psychological factors: Secondary | ICD-10-CM | POA: Diagnosis not present

## 2013-08-31 ENCOUNTER — Other Ambulatory Visit: Payer: Self-pay | Admitting: Internal Medicine

## 2013-09-19 ENCOUNTER — Ambulatory Visit (INDEPENDENT_AMBULATORY_CARE_PROVIDER_SITE_OTHER): Payer: Medicare Other | Admitting: *Deleted

## 2013-09-19 DIAGNOSIS — Z299 Encounter for prophylactic measures, unspecified: Secondary | ICD-10-CM

## 2013-09-21 LAB — TB SKIN TEST
Induration: 0 mm
TB Skin Test: NEGATIVE

## 2013-09-25 DIAGNOSIS — M961 Postlaminectomy syndrome, not elsewhere classified: Secondary | ICD-10-CM | POA: Diagnosis not present

## 2013-10-22 DIAGNOSIS — Z79899 Other long term (current) drug therapy: Secondary | ICD-10-CM | POA: Diagnosis not present

## 2013-10-22 DIAGNOSIS — M961 Postlaminectomy syndrome, not elsewhere classified: Secondary | ICD-10-CM | POA: Diagnosis not present

## 2013-10-22 DIAGNOSIS — IMO0002 Reserved for concepts with insufficient information to code with codable children: Secondary | ICD-10-CM | POA: Diagnosis not present

## 2013-10-22 DIAGNOSIS — M542 Cervicalgia: Secondary | ICD-10-CM | POA: Diagnosis not present

## 2013-10-23 ENCOUNTER — Other Ambulatory Visit: Payer: Self-pay | Admitting: Internal Medicine

## 2013-11-23 ENCOUNTER — Other Ambulatory Visit: Payer: Self-pay | Admitting: Internal Medicine

## 2013-12-04 DIAGNOSIS — M5412 Radiculopathy, cervical region: Secondary | ICD-10-CM | POA: Diagnosis not present

## 2013-12-04 DIAGNOSIS — Z6834 Body mass index (BMI) 34.0-34.9, adult: Secondary | ICD-10-CM | POA: Diagnosis not present

## 2013-12-04 DIAGNOSIS — M542 Cervicalgia: Secondary | ICD-10-CM | POA: Diagnosis not present

## 2013-12-12 DIAGNOSIS — M5412 Radiculopathy, cervical region: Secondary | ICD-10-CM | POA: Diagnosis not present

## 2013-12-12 DIAGNOSIS — M5030 Other cervical disc degeneration, unspecified cervical region: Secondary | ICD-10-CM | POA: Diagnosis not present

## 2014-01-15 ENCOUNTER — Other Ambulatory Visit: Payer: Self-pay | Admitting: Internal Medicine

## 2014-01-17 ENCOUNTER — Other Ambulatory Visit: Payer: Self-pay | Admitting: *Deleted

## 2014-01-17 ENCOUNTER — Telehealth: Payer: Self-pay | Admitting: Internal Medicine

## 2014-01-17 MED ORDER — AZATHIOPRINE 50 MG PO TABS: 50.0000 mg | ORAL_TABLET | Freq: Two times a day (BID) | ORAL | Status: DC

## 2014-01-17 NOTE — Telephone Encounter (Signed)
Pharm is calling to clarify direction on azathiprine.

## 2014-01-17 NOTE — Telephone Encounter (Signed)
Called pharmacy and spoke to [place], clarified Rx pt is taking 50 mg twice a day, disp #180. Gaya verbalized understanding.

## 2014-01-21 ENCOUNTER — Encounter: Payer: Self-pay | Admitting: Internal Medicine

## 2014-01-21 ENCOUNTER — Ambulatory Visit (INDEPENDENT_AMBULATORY_CARE_PROVIDER_SITE_OTHER): Payer: Medicare Other | Admitting: Internal Medicine

## 2014-01-21 VITALS — BP 120/80 | HR 75 | Temp 98.5°F | Resp 20 | Ht 69.0 in | Wt 231.0 lb

## 2014-01-21 DIAGNOSIS — G894 Chronic pain syndrome: Secondary | ICD-10-CM | POA: Diagnosis not present

## 2014-01-21 DIAGNOSIS — I10 Essential (primary) hypertension: Secondary | ICD-10-CM

## 2014-01-21 DIAGNOSIS — E538 Deficiency of other specified B group vitamins: Secondary | ICD-10-CM

## 2014-01-21 DIAGNOSIS — D869 Sarcoidosis, unspecified: Secondary | ICD-10-CM | POA: Diagnosis not present

## 2014-01-21 DIAGNOSIS — M1990 Unspecified osteoarthritis, unspecified site: Secondary | ICD-10-CM

## 2014-01-21 DIAGNOSIS — E785 Hyperlipidemia, unspecified: Secondary | ICD-10-CM

## 2014-01-21 DIAGNOSIS — I428 Other cardiomyopathies: Secondary | ICD-10-CM

## 2014-01-21 DIAGNOSIS — R229 Localized swelling, mass and lump, unspecified: Secondary | ICD-10-CM

## 2014-01-21 MED ORDER — CYANOCOBALAMIN 1000 MCG/ML IJ SOLN: 1000.0000 ug | INTRAMUSCULAR | Status: DC

## 2014-01-21 NOTE — Progress Notes (Signed)
Pre-visit discussion using our clinic review tool. No additional management support is needed unless otherwise documented below in the visit note.  

## 2014-01-21 NOTE — Progress Notes (Signed)
Subjective:    Patient ID: [name], male    DOB: 07/08/1966, 48 y.o.   MRN: [id number]  HPI 48 year old patient who has multiple medical problems.  He has hypertension, dyslipidemia, and B12 deficiency.  He has chronic pain.  He has a history of hypertrophic adenopathy, which has been stable.  He has sarcoidosis. His only complaint today is a lesion about his left elbow.  He has had benign lesion resected from this site in the past.  He has a history of anxiety, depression, which has been stable. No new concerns or complaints Remains on atorvastatin, which he tolerates well. He has history of chest pain, which has been stable.  He was hospitalized about 6 months ago for chest pain that included a heart catheterization.  Past Medical History  Diagnosis Date  . B12 DEFICIENCY 03/06/2007  . CARDIOMYOPATHY, IDIOPATHIC HYPERTROPHIC 03/06/2007  . DEPRESSION 03/06/2007  . GERD 06/01/2007  . HYPERCHOLESTEROLEMIA 03/06/2007  . HYPERLIPIDEMIA 11/08/2008  . IMPAIRED GLUCOSE TOLERANCE 11/08/2008  . NEPHROLITHIASIS, HX OF 04/07/2010  . 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 OSTEOARTHRITIS 06/01/2007  . Palpitations 10/23/2007  . SYNDROME, CHRONIC PAIN 03/06/2007  . Shortness of breath   . Cancer     prostate ca  . Prostate cancer   . HYPERTENSION 03/06/2007    [name]  . Pneumonia     hx of  . Kidney stones     hx of , had surgery for removal  . Dysrhythmia     sees [name] for "palpitations"  . DVT (deep venous thrombosis)   . Anginal pain   . Anxiety   . SLEEP APNEA 03/06/2007    uses vpap, last sleep study 2011, Dr. Alanson Puls, at [place]  . Marital Status: Married    Spouse Name: N/A    Number of Children: N/A  . 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 Smoking status: Former Smoker    Quit date: 09/07/1983  . Smokeless tobacco: Never Used  . Alcohol Use: No  . Drug Use: No  . Sexual  Activity: Yes   Other Topics Concern  . Not on file   Social History Narrative  . No narrative on file    Past Surgical History  Procedure Laterality Date  . Decompression and fusion      cervicothoracic  . Partial nephrectomy    . Cardiac catheterization    . Lumbar fusion    . Prostate surgery      robotic prostatectomy  . Penile prosthesis implant    . Tonsillectomy    . Leg skin lesion  biopsy / excision      left leg    Family History  Problem Relation Age of Onset  . Heart disease Neg Hx     family hx CHF  . Kidney disease Neg Hx     family hx   . Cancer Neg Hx     family hx prostate    Allergies  Allergen Reactions  . Adhesive [Tape] Other (See Comments)    Skin irriation  . Pollen Extract Other (See Comments)    Stuffy nose    Current Outpatient Prescriptions on File Prior to Visit  Medication Sig Dispense Refill  . acetaminophen (TYLENOL) 325 MG tablet Take 1 tablet (325 mg total) by mouth every 6 (six) hours as needed for mild pain, moderate pain  or headache.      . albuterol (PROVENTIL HFA;VENTOLIN HFA) 108 (90 BASE) MCG/ACT inhaler Inhale 2 puffs into the lungs every 6 (six) hours as needed for wheezing or shortness of breath.       Marland Kitchen amitriptyline (ELAVIL) 25 MG tablet Take 1 tablet (25 mg total) by mouth at bedtime.  30 tablet  2  . atenolol (TENORMIN) 25 MG tablet TAKE 1 TABLET BY MOUTH TWICE DAILY  180 tablet  1  . atorvastatin (LIPITOR) 40 MG tablet TAKE ONE TABLET BY MOUTH DAILY  90 tablet  1  . azaTHIOprine (IMURAN) 50 MG tablet Take 1 tablet (50 mg total) by mouth 2 (two) times daily.  180 tablet  3  . buPROPion (WELLBUTRIN XL) 300 MG 24 hr tablet TAKE ONE TABLET BY EVERY MORNING  90 tablet  1  . cyclobenzaprine (FLEXERIL) 10 MG tablet Take 10 mg by mouth as needed.       . cycloSPORINE (RESTASIS) 0.05 % ophthalmic emulsion Place 1 drop into both eyes 2 (two) times daily.       Marland Kitchen dicyclomine (BENTYL) 10 MG capsule TAKE 1 CAPSULE BY MOUTH 4  TIMES DAILY  360 capsule  1  . docusate sodium (COLACE) 100 MG capsule Take 100 mg by mouth daily.       . DULoxetine (CYMBALTA) 30 MG capsule TAKE ONE CAPSULE BY MOUTH DAILY  90 capsule  2  . DULoxetine (CYMBALTA) 60 MG capsule Take 60 mg by mouth daily. Along with 30mg  to =90mg       . gabapentin (NEURONTIN) 300 MG capsule Take 900 mg by mouth 4 (four) times daily.       Marland Kitchen guaiFENesin (MUCINEX) 600 MG 12 hr tablet Take 600 mg by mouth 2 (two) times daily.       Marland Kitchen NEXIUM 40 MG capsule Take 40 mg by mouth daily.       Marland Kitchen oxybutynin (DITROPAN-XL) 5 MG 24 hr tablet Take 5 mg by mouth daily.      Marland Kitchen oxyCODONE-acetaminophen (PERCOCET) 10-325 MG per tablet Take 1 tablet by mouth every 4 (four) hours as needed for pain.  100 tablet  0  . predniSONE (DELTASONE) 5 MG tablet Take 5 mg by mouth daily.       . promethazine (PHENERGAN) 25 MG tablet Take 25 mg by mouth every 6 (six) hours as needed for nausea.       . traMADol (ULTRAM) 50 MG tablet TAKE ONE TABLET BY MOUTH EVERY 6 HOURS AS NEEDED FOR PAIN  90 tablet  1   No current facility-administered medications on file prior to visit.    BP 120/80  Pulse 75  Temp(Src) 98.5 F (36.9 C) (Oral)  Resp 20  Ht 5\' 9"  (1.753 m)  Wt 231 lb (104.781 kg)  BMI 34.10 kg/m2  SpO2 98%        Review of Systems  Constitutional: Negative for fever, chills, appetite change and fatigue.  HENT: Negative for congestion, dental problem, ear pain, hearing loss, sore throat, tinnitus, trouble swallowing and voice change.   Eyes: Negative for pain, discharge and visual disturbance.  Respiratory: Negative for cough, chest tightness, wheezing and stridor.   Cardiovascular: Negative for chest pain, palpitations and leg swelling.  Gastrointestinal: Negative for nausea, vomiting, abdominal pain, diarrhea, constipation, blood in stool and abdominal distention.  Genitourinary: Negative for urgency, hematuria, flank pain, discharge, difficulty urinating and genital sores.    Musculoskeletal: Negative for arthralgias, back pain, gait problem, joint swelling,  myalgias and neck stiffness.  Skin: Positive for wound. Negative for rash.  Neurological: Negative for dizziness, syncope, speech difficulty, weakness, numbness and headaches.  Hematological: Negative for adenopathy. Does not bruise/bleed easily.  Psychiatric/Behavioral: Negative for behavioral problems and dysphoric mood. The patient is not nervous/anxious.        Objective:   Physical Exam  Constitutional: He is oriented to person, place, and time. He appears well-developed.  Weight 231  HENT:  Head: Normocephalic.  Right Ear: External ear normal.  Left Ear: External ear normal.  Eyes: Conjunctivae and EOM are normal.  Neck: Normal range of motion.  Cardiovascular: Normal rate and normal heart sounds.   Pulmonary/Chest: Breath sounds normal.  Abdominal: Bowel sounds are normal.  Musculoskeletal: Normal range of motion. He exhibits no edema and no tenderness.  Neurological: He is alert and oriented to person, place, and time.  Skin:  Nodular lesion, left elbow at side of prior resection  Psychiatric: He has a normal mood and affect. His behavior is normal.          Assessment & Plan:  Cardiomyopathy Stable Hypertension controlled Dyslipidemia.  Continue statin therapy Impaired glucose tolerance, weight loss encouraged Nodular lesion, left elbow.  Refer dermatology

## 2014-01-21 NOTE — Patient Instructions (Signed)
Dermatology followup  Limit your sodium (Salt) intake  Please check your blood pressure on a regular basis.  If it is consistently greater than 150/90, please make an office appointment.  You need to lose weight.  Consider a lower calorie diet and regular exercise.  Return in 6 months for follow-up

## 2014-01-22 ENCOUNTER — Telehealth: Payer: Self-pay | Admitting: Internal Medicine

## 2014-01-22 NOTE — Telephone Encounter (Signed)
Relevant patient education mailed to patient.  

## 2014-01-23 DIAGNOSIS — D869 Sarcoidosis, unspecified: Secondary | ICD-10-CM | POA: Diagnosis not present

## 2014-01-24 DIAGNOSIS — G4730 Sleep apnea, unspecified: Secondary | ICD-10-CM | POA: Diagnosis not present

## 2014-01-24 DIAGNOSIS — IMO0002 Reserved for concepts with insufficient information to code with codable children: Secondary | ICD-10-CM | POA: Diagnosis not present

## 2014-01-24 DIAGNOSIS — Z79899 Other long term (current) drug therapy: Secondary | ICD-10-CM | POA: Diagnosis not present

## 2014-01-24 DIAGNOSIS — M961 Postlaminectomy syndrome, not elsewhere classified: Secondary | ICD-10-CM | POA: Diagnosis not present

## 2014-01-30 ENCOUNTER — Other Ambulatory Visit: Payer: Self-pay | Admitting: *Deleted

## 2014-01-30 MED ORDER — CYANOCOBALAMIN 1000 MCG/ML IJ SOLN: 1000.0000 ug | INTRAMUSCULAR | Status: DC

## 2014-02-20 DIAGNOSIS — D869 Sarcoidosis, unspecified: Secondary | ICD-10-CM | POA: Diagnosis not present

## 2014-03-10 ENCOUNTER — Other Ambulatory Visit: Payer: Self-pay | Admitting: Internal Medicine

## 2014-03-22 DIAGNOSIS — D869 Sarcoidosis, unspecified: Secondary | ICD-10-CM | POA: Diagnosis not present

## 2014-03-22 DIAGNOSIS — M549 Dorsalgia, unspecified: Secondary | ICD-10-CM | POA: Diagnosis not present

## 2014-03-22 DIAGNOSIS — Z6835 Body mass index (BMI) 35.0-35.9, adult: Secondary | ICD-10-CM | POA: Diagnosis not present

## 2014-03-29 ENCOUNTER — Other Ambulatory Visit: Payer: Self-pay | Admitting: Neurosurgery

## 2014-03-30 ENCOUNTER — Other Ambulatory Visit: Payer: Self-pay | Admitting: Internal Medicine

## 2014-04-04 NOTE — Pre-Procedure Instructions (Addendum)
[name]  04/04/2014   Your procedure is scheduled on: Thursday, April 11, 2014  Report to [facility] Admitting at 1030 AM.  Call this number if you have problems the morning of surgery: [phone]   Remember:   Do not eat food or drink liquids after midnight Wednesday, April 10, 2014   Take these medicines the morning of surgery with A SIP OF WATER: ALPRAZolam (XANAX),  atenolol (TENORMIN), azaTHIOprine (IMURAN), buPROPion (WELLBUTRIN), DULoxetine (CYMBALTA), gabapentin (NEURONTIN), , NEXIUM, oxybutynin (DITROPAN), predniSONE (DELTASONE),    if needed:dicyclomine,Pain medication, inhaler for wheezing or shortness of breath ( bring inhaler on day of surgery)   Take all meds as ordered until day of surgery  except as instructed below or per dr  Stop taking Aspirin, otc vitamins, and herbal medications. Do not take any NSAIDs ie: Ibuprofen, Advil, Naproxen or any medication containing Aspirin; stop now.     Do not wear jewelry, make-up or nail polish.  Do not wear lotions, powders, or perfumes. You may  NOT wear deodorant.  Do not shave 48 hours prior to surgery. Men may shave face and neck.  Do not bring valuables to the hospital.  [facility] is not responsible for any belongings or valuables.               Contacts, dentures or bridgework may not be worn into surgery.  Leave suitcase in the car. After surgery it may be brought to your room.  For patients admitted to the hospital, discharge time is determined by your treatment team.               Patients discharged the day of surgery will not be allowed to drive home.  Name and phone number of your driver:  Special Instructions:  Special Instructions: [facility] - Preparing for Surgery  Before surgery, you can play an important role.  Because skin is not sterile, your skin needs to be as free of germs as possible.  You can reduce the number of germs on you skin by washing with CHG (chlorahexidine gluconate) soap  before surgery.  CHG is an antiseptic cleaner which kills germs and bonds with the skin to continue killing germs even after washing.  Please DO NOT use if you have an allergy to CHG or antibacterial soaps.  If your skin becomes reddened/irritated stop using the CHG and inform your nurse when you arrive at Short Stay.  Do not shave (including legs and underarms) for at least 48 hours prior to the first CHG shower.  You may shave your face.  Please follow these instructions carefully:   1.  Shower with CHG Soap the night before surgery and the morning of Surgery.  2.  If you choose to wash your hair, wash your hair first as usual with your normal shampoo.  3.  After you shampoo, rinse your hair and body thoroughly to remove the Shampoo.  4.  Use CHG as you would any other liquid soap.  You can apply chg directly  to the skin and wash gently with scrungie or a clean washcloth.  5.  Apply the CHG Soap to your body ONLY FROM THE NECK DOWN.  Do not use on open wounds or open sores.  Avoid contact with your eyes ears, mouth and genitals (private parts).  Wash genitals (private parts)       with your normal soap.  6.  Wash thoroughly, paying special attention to the area where your surgery  will be performed.  7.  Thoroughly rinse your body with warm water from the neck down.  8.  DO NOT shower/wash with your normal soap after using and rinsing off the CHG Soap.  9.  Pat yourself dry with a clean towel.            10.  Wear clean pajamas.            11.  Place clean sheets on your bed the night of your first shower and do not sleep with pets.  Day of Surgery  Do not apply any lotions/deodorants the morning of surgery.  Please wear clean clothes to the hospital/surgery center.   Please read over the following fact sheets that you were given: Pain Booklet, Coughing and Deep Breathing, MRSA Information and Surgical Site Infection Prevention

## 2014-04-05 ENCOUNTER — Encounter (HOSPITAL_COMMUNITY)
Admission: RE | Admit: 2014-04-05 | Discharge: 2014-04-05 | Disposition: A | Discharge status: Home / Self Care | Payer: Medicare Other | Source: Ambulatory Visit | Attending: Neurosurgery | Admitting: Neurosurgery

## 2014-04-05 ENCOUNTER — Encounter (HOSPITAL_COMMUNITY): Payer: Self-pay

## 2014-04-05 DIAGNOSIS — Z01818 Encounter for other preprocedural examination: Secondary | ICD-10-CM | POA: Diagnosis not present

## 2014-04-05 DIAGNOSIS — Z01812 Encounter for preprocedural laboratory examination: Secondary | ICD-10-CM | POA: Diagnosis not present

## 2014-04-05 HISTORY — DX: Peripheral vascular disease, unspecified: I73.9

## 2014-04-05 HISTORY — DX: Personal history of urinary calculi: Z87.442

## 2014-04-05 LAB — BASIC METABOLIC PANEL
Anion gap: 12 (ref 5–15)
BUN: 14 mg/dL (ref 6–23)
CO2: 26 mEq/L (ref 19–32)
Calcium: 8.9 mg/dL (ref 8.4–10.5)
Chloride: 102 mEq/L (ref 96–112)
Creatinine, Ser: 0.91 mg/dL (ref 0.50–1.35)
GFR calc Af Amer: >90 mL/min (ref >90)
GFR calc non Af Amer: >90 mL/min (ref >90)
Glucose, Bld: 103 mg/dL — ABNORMAL HIGH (ref 70–99)
Potassium: 4.6 mEq/L (ref 3.7–5.3)
Sodium: 140 mEq/L (ref 137–147)

## 2014-04-05 LAB — CBC
HCT: 43 % (ref 39.0–52.0)
Hemoglobin: 14.1 g/dL (ref 13.0–17.0)
MCH: 28.7 pg (ref 26.0–34.0)
MCHC: 32.8 g/dL (ref 30.0–36.0)
MCV: 87.4 fL (ref 78.0–100.0)
Platelets: 298 10*3/uL (ref 150–400)
RBC: 4.92 MIL/uL (ref 4.22–5.81)
RDW: 14 % (ref 11.5–15.5)
WBC: 3.5 10*3/uL — ABNORMAL LOW (ref 4.0–10.5)

## 2014-04-05 LAB — SURGICAL PCR SCREEN
MRSA, PCR: NEGATIVE
Staphylococcus aureus: NEGATIVE

## 2014-04-08 ENCOUNTER — Encounter (HOSPITAL_COMMUNITY): Payer: Self-pay

## 2014-04-08 NOTE — Progress Notes (Signed)
Anesthesia Chart Review: Patient is a 48 year old male scheduled for spinal cord stimulator placement on 04/11/14 by Dr. Arnoldo Morale.  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 PCP is Dr. Bluford Kaufmann. Pulmonologist is Dr. Cleta Alberts with [facility]. Neurologist is Dr. Quinn Axe with [facility]. He was also seen by cardiologist Dr. Conni Slipper also of [facility], but most recently see by [facility] Dr. Kirk Ruths in 2014.  EKG on 07/18/13 showed SB.   Cardiac cath on 07/19/13 (Dr. Haroldine Laws) showed: Normal coronaries, EF 50-55%, no wall motion abnormalities.   Echo on 12/04/12 showed:  - Left ventricle: The cavity size was normal. There was mild focal basal hypertrophy of the septum. Systolic function was normal. The estimated ejection fraction was in the range of 55% to 60%. Regional wall motion abnormalities cannot be excluded. Doppler parameters are consistent with abnormal left ventricular relaxation (grade 1 diastolic dysfunction). - Left atrium: The atrium was mildly dilated. - Tricuspid valve: Trivial regurgitation.  Nuclear stress test on 12/30/11 showed: This is interpreted as a normal Lexiscan Myoview study. There is no evidence of ischemia. There is normal left ventricular systolic function with an EF of 66%.   Cardiac MRI on 09/09/2010 ([facility]) showed:  1. The left ventricle is normal in size and function. The wall thickness is normal and there are no focal wall motion abnormalities. The calculated resting left ventricular ejection fraction is 54% 2. The  left atrium is mildly elevated 3. There is no delayed gadolinium enhancement to suggest prior infarction, inflammation, or infiltrative cardiomyopathy.  CXR on 07/18/2013 showed: 1. No acute abnormality. 2. Stable mild right hilar adenopathy. 3. Stable mild changes of COPD and chronic bronchitis.   Preoperative labs noted.   His last pulmonology note and PFTs were requested from [facility] (I can't view in Care Everywhere).  I've asked nursing staff to have me review once received.  He has had an extensive cardiac work-up most recently within the past year.  He is on prednisone and Imuran.  Based on currently available information, I would anticipate that he could proceed with this procedure if no acute exacerbations of his Sarcoidosis.    [name]/Anesthesiology Phone [phone] 04/08/2014 2:04 PM

## 2014-04-09 NOTE — Progress Notes (Signed)
Call to [facility]- left voicemail to send pulm. Last OV note & PFT's. Asked that they be faxed to [phone].

## 2014-04-10 ENCOUNTER — Emergency Department (HOSPITAL_COMMUNITY): Payer: Medicare Other

## 2014-04-10 ENCOUNTER — Encounter (HOSPITAL_COMMUNITY): Payer: Self-pay | Admitting: Emergency Medicine

## 2014-04-10 ENCOUNTER — Emergency Department (HOSPITAL_COMMUNITY)
Admission: EM | Admit: 2014-04-10 | Discharge: 2014-04-10 | Disposition: A | Discharge status: Home / Self Care | Payer: Medicare Other | Attending: Emergency Medicine | Admitting: Emergency Medicine

## 2014-04-10 DIAGNOSIS — E785 Hyperlipidemia, unspecified: Secondary | ICD-10-CM | POA: Diagnosis not present

## 2014-04-10 DIAGNOSIS — Z8719 Personal history of other diseases of the digestive system: Secondary | ICD-10-CM | POA: Diagnosis not present

## 2014-04-10 DIAGNOSIS — G8929 Other chronic pain: Secondary | ICD-10-CM | POA: Insufficient documentation

## 2014-04-10 DIAGNOSIS — R509 Fever, unspecified: Secondary | ICD-10-CM | POA: Insufficient documentation

## 2014-04-10 DIAGNOSIS — F3289 Other specified depressive episodes: Secondary | ICD-10-CM | POA: Insufficient documentation

## 2014-04-10 DIAGNOSIS — Z79899 Other long term (current) drug therapy: Secondary | ICD-10-CM | POA: Diagnosis not present

## 2014-04-10 DIAGNOSIS — Z8701 Personal history of pneumonia (recurrent): Secondary | ICD-10-CM | POA: Insufficient documentation

## 2014-04-10 DIAGNOSIS — Z87442 Personal history of urinary calculi: Secondary | ICD-10-CM | POA: Diagnosis not present

## 2014-04-10 DIAGNOSIS — R079 Chest pain, unspecified: Secondary | ICD-10-CM | POA: Insufficient documentation

## 2014-04-10 DIAGNOSIS — I10 Essential (primary) hypertension: Secondary | ICD-10-CM | POA: Diagnosis not present

## 2014-04-10 DIAGNOSIS — R110 Nausea: Secondary | ICD-10-CM | POA: Diagnosis not present

## 2014-04-10 DIAGNOSIS — Z86718 Personal history of other venous thrombosis and embolism: Secondary | ICD-10-CM | POA: Insufficient documentation

## 2014-04-10 DIAGNOSIS — E7800 Pure hypercholesterolemia, unspecified: Secondary | ICD-10-CM | POA: Insufficient documentation

## 2014-04-10 DIAGNOSIS — D518 Other vitamin B12 deficiency anemias: Secondary | ICD-10-CM | POA: Insufficient documentation

## 2014-04-10 DIAGNOSIS — M1990 Unspecified osteoarthritis, unspecified site: Secondary | ICD-10-CM | POA: Diagnosis not present

## 2014-04-10 DIAGNOSIS — IMO0002 Reserved for concepts with insufficient information to code with codable children: Secondary | ICD-10-CM | POA: Insufficient documentation

## 2014-04-10 DIAGNOSIS — Z8546 Personal history of malignant neoplasm of prostate: Secondary | ICD-10-CM | POA: Insufficient documentation

## 2014-04-10 DIAGNOSIS — G4730 Sleep apnea, unspecified: Secondary | ICD-10-CM | POA: Insufficient documentation

## 2014-04-10 DIAGNOSIS — F411 Generalized anxiety disorder: Secondary | ICD-10-CM | POA: Diagnosis not present

## 2014-04-10 DIAGNOSIS — Z87891 Personal history of nicotine dependence: Secondary | ICD-10-CM | POA: Insufficient documentation

## 2014-04-10 DIAGNOSIS — F329 Major depressive disorder, single episode, unspecified: Secondary | ICD-10-CM | POA: Diagnosis not present

## 2014-04-10 LAB — CBC WITH DIFFERENTIAL/PLATELET
Basophils Absolute: 0 10*3/uL (ref 0.0–0.1)
Basophils Relative: 0 % (ref 0–1)
Eosinophils Absolute: 0.1 10*3/uL (ref 0.0–0.7)
Eosinophils Relative: 2 % (ref 0–5)
HCT: 41 % (ref 39.0–52.0)
Hemoglobin: 13.7 g/dL (ref 13.0–17.0)
Lymphocytes Relative: 12 % (ref 12–46)
Lymphs Abs: 0.7 10*3/uL (ref 0.7–4.0)
MCH: 29.5 pg (ref 26.0–34.0)
MCHC: 33.4 g/dL (ref 30.0–36.0)
MCV: 88.2 fL (ref 78.0–100.0)
Monocytes Absolute: 0.8 10*3/uL (ref 0.1–1.0)
Monocytes Relative: 13 % — ABNORMAL HIGH (ref 3–12)
Neutro Abs: 4.4 10*3/uL (ref 1.7–7.7)
Neutrophils Relative %: 73 % (ref 43–77)
Platelets: 224 10*3/uL (ref 150–400)
RBC: 4.65 MIL/uL (ref 4.22–5.81)
RDW: 13.8 % (ref 11.5–15.5)
WBC: 6.1 10*3/uL (ref 4.0–10.5)

## 2014-04-10 LAB — URINALYSIS, ROUTINE W REFLEX MICROSCOPIC
Bilirubin Urine: NEGATIVE
Glucose, UA: NEGATIVE mg/dL
Hgb urine dipstick: NEGATIVE
Ketones, ur: NEGATIVE mg/dL
Leukocytes, UA: NEGATIVE
Nitrite: NEGATIVE
Protein, ur: NEGATIVE mg/dL
Specific Gravity, Urine: 1.022 (ref 1.005–1.030)
Urobilinogen, UA: 1 mg/dL (ref 0.0–1.0)
pH: 8.5 — ABNORMAL HIGH (ref 5.0–8.0)

## 2014-04-10 LAB — I-STAT CHEM 8, ED
BUN: 11 mg/dL (ref 6–23)
Calcium, Ion: 1.17 mmol/L (ref 1.12–1.23)
Chloride: 104 mEq/L (ref 96–112)
Creatinine, Ser: 1 mg/dL (ref 0.50–1.35)
Glucose, Bld: 98 mg/dL (ref 70–99)
HCT: 42 % (ref 39.0–52.0)
Hemoglobin: 14.3 g/dL (ref 13.0–17.0)
Potassium: 3.7 mEq/L (ref 3.7–5.3)
Sodium: 139 mEq/L (ref 137–147)
TCO2: 21 mmol/L (ref 0–100)

## 2014-04-10 LAB — D-DIMER, QUANTITATIVE: D-Dimer, Quant: 0.28 ug/mL-FEU (ref 0.00–0.48)

## 2014-04-10 LAB — I-STAT TROPONIN, ED: Troponin i, poc: 0 ng/mL (ref 0.00–0.08)

## 2014-04-10 MED ORDER — SODIUM CHLORIDE 0.9 % IV BOLUS (SEPSIS)
500.0000 mL | Freq: Once | INTRAVENOUS | Status: AC
  Administered 2014-04-10: 500 mL via INTRAVENOUS

## 2014-04-10 MED ORDER — MORPHINE SULFATE 4 MG/ML IJ SOLN
4.0000 mg | Freq: Once | INTRAMUSCULAR | Status: AC
  Administered 2014-04-10: 4 mg via INTRAVENOUS
  Filled 2014-04-10: qty 1

## 2014-04-10 MED ORDER — CEFAZOLIN SODIUM-DEXTROSE 2-3 GM-% IV SOLR
2.0000 g | INTRAVENOUS | Status: AC
Start: 2014-04-11 — End: 2014-04-11
  Administered 2014-04-11: 2 g via INTRAVENOUS
  Filled 2014-04-10: qty 50

## 2014-04-10 MED ORDER — SODIUM CHLORIDE 0.9 % IV BOLUS (SEPSIS)
1000.0000 mL | Freq: Once | INTRAVENOUS | Status: AC
  Administered 2014-04-10: 1000 mL via INTRAVENOUS

## 2014-04-10 NOTE — Discharge Instructions (Signed)

## 2014-04-10 NOTE — ED Notes (Signed)
Patient transported to X-ray 

## 2014-04-10 NOTE — ED Notes (Signed)
Pt here from home with c/o chest pain that started yesterday , pt states that the pain comes and goes radiates to his neck , pt has some nausea also , pt is due to have a nerve stimulator placed tomorrow for leg painn

## 2014-04-10 NOTE — Progress Notes (Signed)
Pt returned phone call and confirmed acknowledgement of surgery time change.  Pt to arrive at 7:40am.

## 2014-04-10 NOTE — ED Provider Notes (Signed)
CSN: [id number]     Arrival date & time 04/10/14  0609 History   First MD Initiated Contact with Patient 04/10/14 [phone]     Chief Complaint  Patient presents with  . Chest Pain     (Consider location/radiation/quality/duration/timing/severity/associated sxs/prior Treatment) HPI Comments: Patient is a 48 year old male with history of neurosarcoidosis hyperlipidemia,prostate cancer, hypertension, DVT who presents to the emergency department today with chest pain since yesterday. He reports that the chest pain began yesterday evening while he was cooking. It is a sharp pain in his left chest it has been constant since that time. He states that his pain mildly improved overnight, but never went away. The pain radiates up into his neck and into his left arm. The pain is worse with inspiration. He has had associated chills. He is unsure if he has had a fever. He denies any shortness of breath, congestion, rhinorrhea, ear pain, sore throat, cough, vomiting, abdominal pain. He last took Tylenol last night without relief of his symptoms. He has had prior cardiac cath. Last cardiac cath was in November of 2014. This was a normal cardiac cath. He has family history of early heart disease with his mother having an MI in her 10s. He is a former smoker.    Patient is a 48 y.o. male presenting with chest pain. The history is provided by the patient. No language interpreter was used.  Chest Pain Associated symptoms: fever (subjective) and nausea   Associated symptoms: no shortness of breath and not vomiting     Past Medical History  Diagnosis Date  . B12 DEFICIENCY 03/06/2007  . CARDIOMYOPATHY, IDIOPATHIC HYPERTROPHIC 03/06/2007  . DEPRESSION 03/06/2007  . GERD 06/01/2007  . HYPERCHOLESTEROLEMIA 03/06/2007  . HYPERLIPIDEMIA 11/08/2008  . IMPAIRED GLUCOSE TOLERANCE 11/08/2008  . NEPHROLITHIASIS, HX OF 04/07/2010  . 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 OSTEOARTHRITIS 06/01/2007  . Palpitations 10/23/2007  . SYNDROME, CHRONIC  PAIN 03/06/2007  . Cancer     prostate ca  . Prostate cancer   . HYPERTENSION 03/06/2007    [name]  . Pneumonia     hx of  . Dysrhythmia     sees [name] for "palpitations"  . DVT (deep venous thrombosis)   . Anxiety   . Peripheral vascular disease 03    dvt  . SLEEP APNEA 03/06/2007    uses vpap, last sleep study 2011, Dr. Alanson Puls, at [facility] of breath     infreq  . History of kidney stones   . Anginal pain     s/p cardiac cath 07/19/13 showing NL coronaries, EF 50-55%   Past Surgical History  Procedure Laterality Date  . Decompression and fusion      cervicothoracic  . Partial nephrectomy Left     bx  . Cardiac catheterization    . Lumbar fusion    . Prostate surgery      robotic prostatectomy  . Penile prosthesis implant    . Tonsillectomy    . Leg skin lesion  biopsy / excision      left leg  . Eye surgery Bilateral     blepharoplasty   Family History  Problem Relation Age of Onset  . Heart disease Neg Hx     family hx CHF  . Kidney disease Neg Hx     family hx   . Cancer Neg Hx     family hx prostate   History  Substance Use Topics  . Smoking status: Former Smoker -- 0.50  packs/day for 3 years    Types: Cigarettes    Quit date: 09/07/1983  . Smokeless tobacco: Never Used  . Alcohol Use: No    Review of Systems  Constitutional: Positive for fever (subjective) and chills.  HENT: Negative for congestion, rhinorrhea and sore throat.   Respiratory: Negative for shortness of breath.   Cardiovascular: Positive for chest pain.  Gastrointestinal: Positive for nausea. Negative for vomiting.  Genitourinary: Negative for dysuria.  All other systems reviewed and are negative.     Allergies  Adhesive and Pollen extract  Home Medications   Prior to Admission medications   Medication Sig Start Date End Date Taking? Authorizing Provider  albuterol (PROVENTIL HFA;VENTOLIN HFA) 108 (90 BASE) MCG/ACT inhaler Inhale 2 puffs into  the lungs every 6 (six) hours as needed for wheezing or shortness of breath.    Yes Historical Provider, MD  ALPRAZolam Duanne Moron) 0.5 MG tablet Take 0.5 mg by mouth 4 (four) times daily as needed for anxiety.   Yes Historical Provider, MD  amitriptyline (ELAVIL) 25 MG tablet Take 1 tablet (25 mg total) by mouth at bedtime. 09/12/12  Yes Marletta Lor, MD  atenolol (TENORMIN) 25 MG tablet Take 25 mg by mouth 2 (two) times daily.   Yes Historical Provider, MD  atorvastatin (LIPITOR) 40 MG tablet Take 40 mg by mouth daily.   Yes Historical Provider, MD  azaTHIOprine (IMURAN) 50 MG tablet Take 1 tablet (50 mg total) by mouth 2 (two) times daily. 01/17/14  Yes Marletta Lor, MD  buPROPion (WELLBUTRIN XL) 300 MG 24 hr tablet Take 300 mg by mouth daily.   Yes Historical Provider, MD  cyanocobalamin (COBAL-1000) 1000 MCG/ML injection Inject 1 mL (1,000 mcg total) into the muscle every 30 (thirty) days. 01/30/14  Yes Marletta Lor, MD  cyclobenzaprine (FLEXERIL) 10 MG tablet Take 10 mg by mouth 3 (three) times daily as needed for muscle spasms.  06/06/13  Yes Historical Provider, MD  cycloSPORINE (RESTASIS) 0.05 % ophthalmic emulsion Place 2 drops into both eyes 2 (two) times daily.    Yes Historical Provider, MD  dicyclomine (BENTYL) 10 MG capsule Take 10 mg by mouth 4 (four) times daily.   Yes Historical Provider, MD  docusate sodium (COLACE) 100 MG capsule Take 100 mg by mouth daily as needed for moderate constipation.    Yes Historical Provider, MD  DULoxetine (CYMBALTA) 30 MG capsule Take 30 mg by mouth daily.   Yes Historical Provider, MD  DULoxetine (CYMBALTA) 60 MG capsule Take 60 mg by mouth daily. Along with 30mg  to =90mg    Yes Historical Provider, MD  gabapentin (NEURONTIN) 300 MG capsule Take 900 mg by mouth 4 (four) times daily.    Yes Historical Provider, MD  guaiFENesin (MUCINEX) 600 MG 12 hr tablet Take 600 mg by mouth 2 (two) times daily.    Yes Historical Provider, MD   Hypromellose (SYSTANE OVERNIGHT THERAPY) 0.3 % GEL Place 1 drop into both eyes at bedtime.   Yes Historical Provider, MD  lidocaine (LIDODERM) 5 % Place 1 patch onto the skin daily.    Yes Historical Provider, MD  NEXIUM 40 MG capsule Take 40 mg by mouth daily.  06/12/13  Yes Historical Provider, MD  oxybutynin (DITROPAN) 5 MG tablet Take 5 mg by mouth daily.   Yes Historical Provider, MD  oxyCODONE-acetaminophen (PERCOCET) 10-325 MG per tablet Take 1 tablet by mouth every 6 (six) hours as needed for pain.   Yes Historical Provider, MD  Polyethyl  Glycol-Propyl Glycol (SYSTANE OP) Place 2 drops into both eyes 2 (two) times daily.   Yes Historical Provider, MD  predniSONE (DELTASONE) 5 MG tablet Take 5 mg by mouth daily.    Yes Historical Provider, MD  promethazine (PHENERGAN) 25 MG tablet Take 25 mg by mouth every 6 (six) hours as needed for nausea.    Yes Historical Provider, MD  traMADol (ULTRAM) 50 MG tablet Take 50 mg by mouth every 6 (six) hours as needed for moderate pain.   Yes Historical Provider, MD  triamcinolone (NASACORT AQ) 55 MCG/ACT AERO nasal inhaler Place 1 spray into both nostrils daily.    Yes Historical Provider, MD  triamcinolone ointment (KENALOG) 0.1 % Apply 1 application topically 2 (two) times daily.   Yes Historical Provider, MD   BP 116/64  Pulse 100  Temp(Src) 99.5 F (37.5 C) (Rectal)  Resp 20  Ht 5\' 9"  (1.753 m)  Wt 230 lb (104.327 kg)  BMI 33.95 kg/m2  SpO2 99% Physical Exam  Nursing note and vitals reviewed. Constitutional: He is oriented to person, place, and time. He appears well-developed and well-nourished. No distress.  HENT:  Head: Normocephalic and atraumatic.  Right Ear: External ear normal.  Left Ear: External ear normal.  Nose: Nose normal.  Eyes: Conjunctivae are normal.  Neck: Normal range of motion. No tracheal deviation present.  Cardiovascular: Normal rate, regular rhythm, normal heart sounds, intact distal pulses and normal pulses.    Pulses:      Radial pulses are 2+ on the right side, and 2+ on the left side.       Posterior tibial pulses are 2+ on the right side, and 2+ on the left side.  No calf swelling  Pulmonary/Chest: Effort normal and breath sounds normal. No stridor.  Abdominal: Soft. He exhibits no distension. There is no tenderness.  Musculoskeletal: Normal range of motion.  Neurological: He is alert and oriented to person, place, and time.  Skin: Skin is warm and dry. He is not diaphoretic.  Psychiatric: He has a normal mood and affect. His behavior is normal.    ED Course  Procedures (including critical care time) Labs Review Labs Reviewed  CBC WITH DIFFERENTIAL - Abnormal; Notable for the following:    Monocytes Relative 13 (*)    All other components within normal limits  URINALYSIS, ROUTINE W REFLEX MICROSCOPIC - Abnormal; Notable for the following:    APPearance CLOUDY (*)    pH 8.5 (*)    All other components within normal limits  URINE CULTURE  D-DIMER, QUANTITATIVE  I-STAT TROPOININ, ED  I-STAT CHEM 8, ED    Imaging Review Dg Chest 2 View  04/10/2014   CLINICAL DATA:  Chest pain  EXAM: CHEST  2 VIEW  COMPARISON:  07/18/2013  FINDINGS: No cardiomegaly. Prominent hila which may reflect on going lymphadenopathy (noted on chest CT 09/09/2012). There is no edema, consolidation, effusion, or pneumothorax. No osseous findings to explain chest pain.  IMPRESSION: No active cardiopulmonary disease.   Electronically Signed   By: Jorje Guild M.D.   On: 04/10/2014 06:53     EKG Interpretation   Date/Time:  Wednesday April 10 2014 06:13:34 EDT Ventricular Rate:  114 PR Interval:  172 QRS Duration: 92 QT Interval:  318 QTC Calculation: 438 R Axis:   -18 Text Interpretation:  Sinus tachycardia Cannot rule out Anteroseptal  infarct , age undetermined Abnormal ECG No significant change since last  tracing Confirmed by ZACKOWSKI  MD, SCOTT [phone]) on 04/10/2014  10:10:45 AM      MDM   Final  diagnoses:  Chest pain, unspecified chest pain type    Patient is to be discharged with recommendation to follow up with PCP and cardiologist in regards to today's hospital visit. Chest pain is not likely of cardiac or pulmonary etiology d/t presentation, negative d dimer, VSS, no tracheal deviation, no JVD or new murmur, RRR, breath sounds equal bilaterally, EKG without acute abnormalities, negative troponin, and negative CXR. Patient with clean cardiac catheterization 07/2013. Pt has been advised to return to the ED is CP becomes exertional, associated with diaphoresis or nausea, radiates to left jaw/arm, worsens or becomes concerning in any way. Pt appears reliable for follow up and is agreeable to discharge.   Case has been discussed with Dr. Rogene Houston who agrees with the above plan to discharge.      Elwyn Lade, PA-C 04/10/14 1454

## 2014-04-11 ENCOUNTER — Encounter (HOSPITAL_COMMUNITY)
Admission: RE | Disposition: A | Discharge status: Home / Self Care | Payer: Self-pay | Source: Ambulatory Visit | Attending: Neurosurgery

## 2014-04-11 ENCOUNTER — Ambulatory Visit (HOSPITAL_COMMUNITY): Payer: Medicare Other

## 2014-04-11 ENCOUNTER — Ambulatory Visit (HOSPITAL_COMMUNITY)
Admission: RE | Admit: 2014-04-11 | Discharge: 2014-04-12 | Disposition: A | Discharge status: Home / Self Care | Payer: Medicare Other | Source: Ambulatory Visit | Attending: Neurosurgery | Admitting: Neurosurgery

## 2014-04-11 ENCOUNTER — Ambulatory Visit (HOSPITAL_COMMUNITY): Payer: Medicare Other | Admitting: Certified Registered Nurse Anesthetist

## 2014-04-11 ENCOUNTER — Encounter (HOSPITAL_COMMUNITY): Payer: Self-pay | Admitting: *Deleted

## 2014-04-11 ENCOUNTER — Encounter (HOSPITAL_COMMUNITY): Payer: Medicare Other | Admitting: Vascular Surgery

## 2014-04-11 DIAGNOSIS — G4730 Sleep apnea, unspecified: Secondary | ICD-10-CM | POA: Diagnosis not present

## 2014-04-11 DIAGNOSIS — K219 Gastro-esophageal reflux disease without esophagitis: Secondary | ICD-10-CM | POA: Diagnosis not present

## 2014-04-11 DIAGNOSIS — M5441 Lumbago with sciatica, right side: Secondary | ICD-10-CM

## 2014-04-11 DIAGNOSIS — M545 Low back pain, unspecified: Secondary | ICD-10-CM | POA: Insufficient documentation

## 2014-04-11 DIAGNOSIS — I10 Essential (primary) hypertension: Secondary | ICD-10-CM | POA: Insufficient documentation

## 2014-04-11 DIAGNOSIS — G8929 Other chronic pain: Secondary | ICD-10-CM | POA: Diagnosis not present

## 2014-04-11 DIAGNOSIS — F329 Major depressive disorder, single episode, unspecified: Secondary | ICD-10-CM | POA: Insufficient documentation

## 2014-04-11 DIAGNOSIS — I739 Peripheral vascular disease, unspecified: Secondary | ICD-10-CM | POA: Diagnosis not present

## 2014-04-11 DIAGNOSIS — Z87891 Personal history of nicotine dependence: Secondary | ICD-10-CM | POA: Insufficient documentation

## 2014-04-11 DIAGNOSIS — M1990 Unspecified osteoarthritis, unspecified site: Secondary | ICD-10-CM | POA: Diagnosis not present

## 2014-04-11 DIAGNOSIS — E7800 Pure hypercholesterolemia, unspecified: Secondary | ICD-10-CM | POA: Insufficient documentation

## 2014-04-11 DIAGNOSIS — F3289 Other specified depressive episodes: Secondary | ICD-10-CM | POA: Insufficient documentation

## 2014-04-11 DIAGNOSIS — M519 Unspecified thoracic, thoracolumbar and lumbosacral intervertebral disc disorder: Secondary | ICD-10-CM | POA: Diagnosis not present

## 2014-04-11 DIAGNOSIS — Z981 Arthrodesis status: Secondary | ICD-10-CM | POA: Insufficient documentation

## 2014-04-11 DIAGNOSIS — F411 Generalized anxiety disorder: Secondary | ICD-10-CM | POA: Diagnosis not present

## 2014-04-11 DIAGNOSIS — IMO0002 Reserved for concepts with insufficient information to code with codable children: Secondary | ICD-10-CM | POA: Diagnosis not present

## 2014-04-11 DIAGNOSIS — M549 Dorsalgia, unspecified: Secondary | ICD-10-CM | POA: Diagnosis present

## 2014-04-11 DIAGNOSIS — M5442 Lumbago with sciatica, left side: Secondary | ICD-10-CM

## 2014-04-11 HISTORY — PX: SPINAL CORD STIMULATOR INSERTION: SHX5378

## 2014-04-11 LAB — URINE CULTURE

## 2014-04-11 SURGERY — INSERTION, SPINAL CORD STIMULATOR, LUMBAR
Anesthesia: General

## 2014-04-11 MED ORDER — GLYCOPYRROLATE 0.2 MG/ML IJ SOLN
INTRAMUSCULAR | Status: DC | PRN
  Administered 2014-04-11: 0.4 mg via INTRAVENOUS

## 2014-04-11 MED ORDER — HYPROMELLOSE 0.3 % OP GEL: 1.0000 [drp] | Freq: Every day | OPHTHALMIC | Status: DC

## 2014-04-11 MED ORDER — ROCURONIUM BROMIDE 100 MG/10ML IV SOLN
INTRAVENOUS | Status: DC | PRN
  Administered 2014-04-11: 50 mg via INTRAVENOUS

## 2014-04-11 MED ORDER — OXYBUTYNIN CHLORIDE 5 MG PO TABS
5.0000 mg | ORAL_TABLET | Freq: Every day | ORAL | Status: DC
  Administered 2014-04-11 – 2014-04-12 (×2): 5 mg via ORAL
  Filled 2014-04-11 (×2): qty 1

## 2014-04-11 MED ORDER — ALPRAZOLAM 0.5 MG PO TABS: 0.5000 mg | ORAL_TABLET | Freq: Four times a day (QID) | ORAL | Status: DC | PRN

## 2014-04-11 MED ORDER — ONDANSETRON HCL 4 MG/2ML IJ SOLN
INTRAMUSCULAR | Status: AC
  Filled 2014-04-11: qty 2

## 2014-04-11 MED ORDER — ONDANSETRON HCL 4 MG/2ML IJ SOLN
INTRAMUSCULAR | Status: DC | PRN
  Administered 2014-04-11: 4 mg via INTRAVENOUS

## 2014-04-11 MED ORDER — BUPIVACAINE-EPINEPHRINE (PF) 0.5% -1:200000 IJ SOLN
INTRAMUSCULAR | Status: DC | PRN
  Administered 2014-04-11: 17 mL

## 2014-04-11 MED ORDER — ONDANSETRON HCL 4 MG/2ML IJ SOLN
4.0000 mg | INTRAMUSCULAR | Status: DC | PRN
  Administered 2014-04-11: 4 mg via INTRAVENOUS

## 2014-04-11 MED ORDER — LACTATED RINGERS IV SOLN
INTRAVENOUS | Status: DC
  Administered 2014-04-11: 08:00:00 via INTRAVENOUS

## 2014-04-11 MED ORDER — HYDROCODONE-ACETAMINOPHEN 5-325 MG PO TABS
1.0000 | ORAL_TABLET | ORAL | Status: DC | PRN
Start: 2014-04-11 — End: 2014-04-12

## 2014-04-11 MED ORDER — ALBUTEROL SULFATE HFA 108 (90 BASE) MCG/ACT IN AERS: 2.0000 | INHALATION_SPRAY | Freq: Four times a day (QID) | RESPIRATORY_TRACT | Status: DC | PRN

## 2014-04-11 MED ORDER — HYDROCORTISONE NA SUCCINATE PF 100 MG IJ SOLR
100.0000 mg | Freq: Three times a day (TID) | INTRAMUSCULAR | Status: DC
  Administered 2014-04-11 – 2014-04-12 (×3): 100 mg via INTRAVENOUS
  Filled 2014-04-11 (×6): qty 2

## 2014-04-11 MED ORDER — DOCUSATE SODIUM 100 MG PO CAPS
100.0000 mg | ORAL_CAPSULE | Freq: Two times a day (BID) | ORAL | Status: DC
  Administered 2014-04-11 – 2014-04-12 (×2): 100 mg via ORAL
  Filled 2014-04-11 (×3): qty 1

## 2014-04-11 MED ORDER — DEXAMETHASONE SODIUM PHOSPHATE 10 MG/ML IJ SOLN
INTRAMUSCULAR | Status: DC | PRN
  Administered 2014-04-11: 10 mg via INTRAVENOUS

## 2014-04-11 MED ORDER — OXYCODONE-ACETAMINOPHEN 5-325 MG PO TABS
1.0000 | ORAL_TABLET | ORAL | Status: DC | PRN
Start: 2014-04-11 — End: 2014-04-12
  Administered 2014-04-11 – 2014-04-12 (×4): 2 via ORAL
  Filled 2014-04-11 (×5): qty 2

## 2014-04-11 MED ORDER — PROPOFOL 10 MG/ML IV BOLUS
INTRAVENOUS | Status: AC
  Filled 2014-04-11: qty 20

## 2014-04-11 MED ORDER — THROMBIN 5000 UNITS EX SOLR
CUTANEOUS | Status: DC | PRN
  Administered 2014-04-11 (×2): 5000 [IU] via TOPICAL

## 2014-04-11 MED ORDER — TRIAMCINOLONE ACETONIDE 55 MCG/ACT NA AERO
1.0000 | INHALATION_SPRAY | Freq: Every day | NASAL | Status: DC
  Filled 2014-04-11: qty 10.8

## 2014-04-11 MED ORDER — MIDAZOLAM HCL 5 MG/5ML IJ SOLN
INTRAMUSCULAR | Status: DC | PRN
  Administered 2014-04-11: 2 mg via INTRAVENOUS

## 2014-04-11 MED ORDER — POLYVINYL ALCOHOL 1.4 % OP SOLN
1.0000 [drp] | Freq: Every day | OPHTHALMIC | Status: DC
  Administered 2014-04-11 – 2014-04-12 (×3): 1 [drp] via OPHTHALMIC
  Filled 2014-04-11: qty 15

## 2014-04-11 MED ORDER — CYCLOSPORINE 0.05 % OP EMUL
2.0000 [drp] | Freq: Two times a day (BID) | OPHTHALMIC | Status: DC
  Administered 2014-04-11 – 2014-04-12 (×2): 2 [drp] via OPHTHALMIC
  Filled 2014-04-11 (×3): qty 1

## 2014-04-11 MED ORDER — ROCURONIUM BROMIDE 50 MG/5ML IV SOLN
INTRAVENOUS | Status: AC
  Filled 2014-04-11: qty 1

## 2014-04-11 MED ORDER — CEFAZOLIN SODIUM-DEXTROSE 2-3 GM-% IV SOLR
2.0000 g | Freq: Three times a day (TID) | INTRAVENOUS | Status: AC
  Administered 2014-04-11 – 2014-04-12 (×2): 2 g via INTRAVENOUS
  Filled 2014-04-11 (×2): qty 50

## 2014-04-11 MED ORDER — MIDAZOLAM HCL 2 MG/2ML IJ SOLN
INTRAMUSCULAR | Status: AC
  Filled 2014-04-11: qty 2

## 2014-04-11 MED ORDER — NEOSTIGMINE METHYLSULFATE 10 MG/10ML IV SOLN
INTRAVENOUS | Status: DC | PRN
  Administered 2014-04-11: 3 mg via INTRAVENOUS

## 2014-04-11 MED ORDER — NEOSTIGMINE METHYLSULFATE 10 MG/10ML IV SOLN
INTRAVENOUS | Status: AC
  Filled 2014-04-11: qty 1

## 2014-04-11 MED ORDER — PHENOL 1.4 % MT LIQD: 1.0000 | OROMUCOSAL | Status: DC | PRN

## 2014-04-11 MED ORDER — ARTIFICIAL TEARS OP OINT
TOPICAL_OINTMENT | OPHTHALMIC | Status: DC | PRN
  Administered 2014-04-11: 1 via OPHTHALMIC

## 2014-04-11 MED ORDER — BACITRACIN 50000 UNITS IM SOLR
INTRAMUSCULAR | Status: DC | PRN
  Administered 2014-04-11: 11:00:00

## 2014-04-11 MED ORDER — HYDROMORPHONE HCL PF 1 MG/ML IJ SOLN
0.2500 mg | INTRAMUSCULAR | Status: DC | PRN
  Administered 2014-04-11 (×4): 0.5 mg via INTRAVENOUS

## 2014-04-11 MED ORDER — OXYCODONE HCL 5 MG PO TABS: 5.0000 mg | ORAL_TABLET | ORAL | Status: DC | PRN

## 2014-04-11 MED ORDER — OXYCODONE-ACETAMINOPHEN 10-325 MG PO TABS: 1.0000 | ORAL_TABLET | ORAL | Status: DC | PRN

## 2014-04-11 MED ORDER — FENTANYL CITRATE 0.05 MG/ML IJ SOLN
INTRAMUSCULAR | Status: DC | PRN
  Administered 2014-04-11: 50 ug via INTRAVENOUS
  Administered 2014-04-11: 100 ug via INTRAVENOUS

## 2014-04-11 MED ORDER — LIDOCAINE HCL (CARDIAC) 20 MG/ML IV SOLN
INTRAVENOUS | Status: AC
  Filled 2014-04-11: qty 5

## 2014-04-11 MED ORDER — CYCLOBENZAPRINE HCL 10 MG PO TABS
10.0000 mg | ORAL_TABLET | Freq: Three times a day (TID) | ORAL | Status: DC | PRN
  Administered 2014-04-11 – 2014-04-12 (×2): 10 mg via ORAL
  Filled 2014-04-11 (×2): qty 1

## 2014-04-11 MED ORDER — PREDNISONE 5 MG PO TABS: 5.0000 mg | ORAL_TABLET | Freq: Every day | ORAL | Status: DC

## 2014-04-11 MED ORDER — LIDOCAINE HCL (CARDIAC) 20 MG/ML IV SOLN
INTRAVENOUS | Status: DC | PRN
  Administered 2014-04-11: 100 mg via INTRAVENOUS

## 2014-04-11 MED ORDER — GUAIFENESIN ER 600 MG PO TB12
600.0000 mg | ORAL_TABLET | Freq: Two times a day (BID) | ORAL | Status: DC
  Administered 2014-04-11 – 2014-04-12 (×2): 600 mg via ORAL
  Filled 2014-04-11 (×3): qty 1

## 2014-04-11 MED ORDER — ALBUTEROL SULFATE (2.5 MG/3ML) 0.083% IN NEBU: 2.5000 mg | INHALATION_SOLUTION | Freq: Four times a day (QID) | RESPIRATORY_TRACT | Status: DC | PRN

## 2014-04-11 MED ORDER — EPHEDRINE SULFATE 50 MG/ML IJ SOLN
INTRAMUSCULAR | Status: AC
  Filled 2014-04-11: qty 1

## 2014-04-11 MED ORDER — OXYCODONE HCL 5 MG PO TABS: 5.0000 mg | ORAL_TABLET | Freq: Once | ORAL | Status: DC | PRN

## 2014-04-11 MED ORDER — LACTATED RINGERS IV SOLN: INTRAVENOUS | Status: DC

## 2014-04-11 MED ORDER — PROPOFOL 10 MG/ML IV BOLUS
INTRAVENOUS | Status: DC | PRN
  Administered 2014-04-11: 270 mg via INTRAVENOUS

## 2014-04-11 MED ORDER — BUPROPION HCL ER (XL) 300 MG PO TB24
300.0000 mg | ORAL_TABLET | Freq: Every day | ORAL | Status: DC
  Administered 2014-04-11 – 2014-04-12 (×2): 300 mg via ORAL
  Filled 2014-04-11 (×2): qty 1

## 2014-04-11 MED ORDER — DICYCLOMINE HCL 10 MG PO CAPS
10.0000 mg | ORAL_CAPSULE | Freq: Four times a day (QID) | ORAL | Status: DC
  Administered 2014-04-11 – 2014-04-12 (×3): 10 mg via ORAL
  Filled 2014-04-11 (×6): qty 1

## 2014-04-11 MED ORDER — ATENOLOL 25 MG PO TABS
25.0000 mg | ORAL_TABLET | Freq: Two times a day (BID) | ORAL | Status: DC
  Administered 2014-04-11 – 2014-04-12 (×2): 25 mg via ORAL
  Filled 2014-04-11 (×3): qty 1

## 2014-04-11 MED ORDER — ATORVASTATIN CALCIUM 40 MG PO TABS
40.0000 mg | ORAL_TABLET | Freq: Every day | ORAL | Status: DC
  Administered 2014-04-11 – 2014-04-12 (×2): 40 mg via ORAL
  Filled 2014-04-11 (×2): qty 1

## 2014-04-11 MED ORDER — GABAPENTIN 300 MG PO CAPS
900.0000 mg | ORAL_CAPSULE | Freq: Four times a day (QID) | ORAL | Status: DC
  Administered 2014-04-11 – 2014-04-12 (×3): 900 mg via ORAL
  Filled 2014-04-11 (×6): qty 3

## 2014-04-11 MED ORDER — 0.9 % SODIUM CHLORIDE (POUR BTL) OPTIME
TOPICAL | Status: DC | PRN
  Administered 2014-04-11: 1000 mL

## 2014-04-11 MED ORDER — OXYCODONE HCL 5 MG/5ML PO SOLN: 5.0000 mg | Freq: Once | ORAL | Status: DC | PRN

## 2014-04-11 MED ORDER — HYDROMORPHONE HCL PF 1 MG/ML IJ SOLN
INTRAMUSCULAR | Status: AC
  Filled 2014-04-11: qty 1

## 2014-04-11 MED ORDER — ARTIFICIAL TEARS OP OINT
TOPICAL_OINTMENT | OPHTHALMIC | Status: AC
  Filled 2014-04-11: qty 3.5

## 2014-04-11 MED ORDER — MORPHINE SULFATE 2 MG/ML IJ SOLN
1.0000 mg | INTRAMUSCULAR | Status: DC | PRN
  Administered 2014-04-11: 2 mg via INTRAVENOUS
  Filled 2014-04-11: qty 1

## 2014-04-11 MED ORDER — PANTOPRAZOLE SODIUM 40 MG PO TBEC
80.0000 mg | DELAYED_RELEASE_TABLET | Freq: Every day | ORAL | Status: DC
  Administered 2014-04-11 – 2014-04-12 (×2): 80 mg via ORAL
  Filled 2014-04-11 (×2): qty 2

## 2014-04-11 MED ORDER — LACTATED RINGERS IV SOLN
INTRAVENOUS | Status: DC | PRN
  Administered 2014-04-11 (×2): via INTRAVENOUS

## 2014-04-11 MED ORDER — ALUM & MAG HYDROXIDE-SIMETH 200-200-20 MG/5ML PO SUSP
30.0000 mL | Freq: Four times a day (QID) | ORAL | Status: DC | PRN
  Administered 2014-04-11: 30 mL via ORAL
  Filled 2014-04-11: qty 30

## 2014-04-11 MED ORDER — OXYCODONE-ACETAMINOPHEN 5-325 MG PO TABS: 1.0000 | ORAL_TABLET | ORAL | Status: DC | PRN

## 2014-04-11 MED ORDER — DIAZEPAM 5 MG PO TABS
5.0000 mg | ORAL_TABLET | Freq: Four times a day (QID) | ORAL | Status: DC | PRN
  Administered 2014-04-11: 5 mg via ORAL
  Filled 2014-04-11: qty 1

## 2014-04-11 MED ORDER — DULOXETINE HCL 60 MG PO CPEP
60.0000 mg | ORAL_CAPSULE | Freq: Every day | ORAL | Status: DC
  Administered 2014-04-11 – 2014-04-12 (×2): 60 mg via ORAL
  Filled 2014-04-11 (×2): qty 1

## 2014-04-11 MED ORDER — SODIUM CHLORIDE 0.9 % IJ SOLN
INTRAMUSCULAR | Status: AC
  Filled 2014-04-11: qty 10

## 2014-04-11 MED ORDER — DULOXETINE HCL 30 MG PO CPEP
30.0000 mg | ORAL_CAPSULE | Freq: Every day | ORAL | Status: DC
  Administered 2014-04-11 – 2014-04-12 (×2): 30 mg via ORAL
  Filled 2014-04-11 (×2): qty 1

## 2014-04-11 MED ORDER — HEMOSTATIC AGENTS (NO CHARGE) OPTIME
TOPICAL | Status: DC | PRN
  Administered 2014-04-11: 1 via TOPICAL

## 2014-04-11 MED ORDER — TRAMADOL HCL 50 MG PO TABS: 50.0000 mg | ORAL_TABLET | Freq: Four times a day (QID) | ORAL | Status: DC | PRN

## 2014-04-11 MED ORDER — SUCCINYLCHOLINE CHLORIDE 20 MG/ML IJ SOLN
INTRAMUSCULAR | Status: AC
  Filled 2014-04-11: qty 1

## 2014-04-11 MED ORDER — AMITRIPTYLINE HCL 25 MG PO TABS
25.0000 mg | ORAL_TABLET | Freq: Every day | ORAL | Status: DC
  Administered 2014-04-11: 25 mg via ORAL
  Filled 2014-04-11 (×2): qty 1

## 2014-04-11 MED ORDER — PROMETHAZINE HCL 25 MG PO TABS
25.0000 mg | ORAL_TABLET | Freq: Four times a day (QID) | ORAL | Status: DC | PRN
  Administered 2014-04-11 – 2014-04-12 (×2): 25 mg via ORAL
  Filled 2014-04-11 (×2): qty 1

## 2014-04-11 MED ORDER — ACETAMINOPHEN 325 MG PO TABS: 650.0000 mg | ORAL_TABLET | ORAL | Status: DC | PRN

## 2014-04-11 MED ORDER — FENTANYL CITRATE 0.05 MG/ML IJ SOLN
INTRAMUSCULAR | Status: AC
  Filled 2014-04-11: qty 5

## 2014-04-11 MED ORDER — GLYCOPYRROLATE 0.2 MG/ML IJ SOLN
INTRAMUSCULAR | Status: AC
  Filled 2014-04-11: qty 3

## 2014-04-11 MED ORDER — ACETAMINOPHEN 650 MG RE SUPP: 650.0000 mg | RECTAL | Status: DC | PRN

## 2014-04-11 MED ORDER — DEXAMETHASONE SODIUM PHOSPHATE 10 MG/ML IJ SOLN
INTRAMUSCULAR | Status: AC
  Filled 2014-04-11: qty 2

## 2014-04-11 MED ORDER — MENTHOL 3 MG MT LOZG: 1.0000 | LOZENGE | OROMUCOSAL | Status: DC | PRN

## 2014-04-11 MED ORDER — PROMETHAZINE HCL 25 MG/ML IJ SOLN
6.2500 mg | INTRAMUSCULAR | Status: DC | PRN
Start: 2014-04-11 — End: 2014-04-11

## 2014-04-11 SURGICAL SUPPLY — 57 items
BAG DECANTER FOR FLEXI CONT (MISCELLANEOUS) ×2 IMPLANT
BENZOIN TINCTURE PRP APPL 2/3 (GAUZE/BANDAGES/DRESSINGS) ×2 IMPLANT
BLADE SURG ROTATE 9660 (MISCELLANEOUS) IMPLANT
BRUSH SCRUB EZ PLAIN DRY (MISCELLANEOUS) ×2 IMPLANT
BUR ACORN 6.0 (BURR) ×2 IMPLANT
BUR MATCHSTICK NEURO 3.0 LAGG (BURR) ×2 IMPLANT
CONT SPEC 4OZ CLIKSEAL STRL BL (MISCELLANEOUS) ×2 IMPLANT
DERMABOND ADVANCED (GAUZE/BANDAGES/DRESSINGS)
DERMABOND ADVANCED .7 DNX12 (GAUZE/BANDAGES/DRESSINGS) IMPLANT
DRAPE C-ARM 42X72 X-RAY (DRAPES) ×2 IMPLANT
DRAPE INCISE IOBAN 85X60 (DRAPES) IMPLANT
DRAPE LAPAROTOMY 100X72X124 (DRAPES) ×2 IMPLANT
DRAPE POUCH INSTRU U-SHP 10X18 (DRAPES) ×2 IMPLANT
DRAPE SURG 17X23 STRL (DRAPES) ×8 IMPLANT
DRSG TELFA 3X8 NADH (GAUZE/BANDAGES/DRESSINGS) ×2 IMPLANT
ELECT REM PT RETURN 9FT ADLT (ELECTROSURGICAL) ×2
ELECTRODE REM PT RTRN 9FT ADLT (ELECTROSURGICAL) ×1 IMPLANT
ELEVATER PASSER (SPINAL CORD STIMULATOR) ×2
GAUZE SPONGE 4X4 16PLY XRAY LF (GAUZE/BANDAGES/DRESSINGS) IMPLANT
GLOVE BIO SURGEON STRL SZ8.5 (GLOVE) ×2 IMPLANT
GLOVE EXAM NITRILE LRG STRL (GLOVE) IMPLANT
GLOVE EXAM NITRILE MD LF STRL (GLOVE) IMPLANT
GLOVE EXAM NITRILE XL STR (GLOVE) IMPLANT
GLOVE EXAM NITRILE XS STR PU (GLOVE) IMPLANT
GLOVE SS BIOGEL STRL SZ 8 (GLOVE) ×1 IMPLANT
GLOVE SUPERSENSE BIOGEL SZ 8 (GLOVE) ×1
GOWN STRL REUS W/ TWL LRG LVL3 (GOWN DISPOSABLE) IMPLANT
GOWN STRL REUS W/ TWL XL LVL3 (GOWN DISPOSABLE) IMPLANT
GOWN STRL REUS W/TWL 2XL LVL3 (GOWN DISPOSABLE) IMPLANT
GOWN STRL REUS W/TWL LRG LVL3 (GOWN DISPOSABLE)
GOWN STRL REUS W/TWL XL LVL3 (GOWN DISPOSABLE)
IPG PRECISION SPECTRA (Stimulator) ×2 IMPLANT
KIT BASIN OR (CUSTOM PROCEDURE TRAY) ×2 IMPLANT
KIT CHARGING (KITS) ×1
KIT CHARGING PRECISION NEURO (KITS) ×1 IMPLANT
KIT REMOTE CONTROL PRECISION (KITS) ×2 IMPLANT
KIT ROOM TURNOVER OR (KITS) ×2 IMPLANT
NEEDLE HYPO 22GX1.5 SAFETY (NEEDLE) ×2 IMPLANT
NS IRRIG 1000ML POUR BTL (IV SOLUTION) ×2 IMPLANT
PACK LAMINECTOMY NEURO (CUSTOM PROCEDURE TRAY) ×2 IMPLANT
PAD ARMBOARD 7.5X6 YLW CONV (MISCELLANEOUS) ×2 IMPLANT
PADDLE LEAD IPG (Lead) ×2 IMPLANT
PASSER ELEVATOR (SPINAL CORD STIMULATOR) ×1 IMPLANT
PATTIES SURGICAL .5 X1 (DISPOSABLE) ×2 IMPLANT
SPONGE LAP 4X18 X RAY DECT (DISPOSABLE) IMPLANT
SPONGE SURGIFOAM ABS GEL SZ50 (HEMOSTASIS) ×2 IMPLANT
STAPLER SKIN PROX WIDE 3.9 (STAPLE) ×2 IMPLANT
STRIP CLOSURE SKIN 1/2X4 (GAUZE/BANDAGES/DRESSINGS) ×2 IMPLANT
SUT SILK 0 TIES 10X30 (SUTURE) IMPLANT
SUT SILK 2 0 FS (SUTURE) ×6 IMPLANT
SUT SILK 2 0 TIES 10X30 (SUTURE) ×2 IMPLANT
SUT VIC AB 1 CT1 18XBRD ANBCTR (SUTURE) ×1 IMPLANT
SUT VIC AB 1 CT1 8-18 (SUTURE) ×1
SUT VIC AB 2-0 CP2 18 (SUTURE) ×2 IMPLANT
TOWEL OR 17X24 6PK STRL BLUE (TOWEL DISPOSABLE) ×2 IMPLANT
TOWEL OR 17X26 10 PK STRL BLUE (TOWEL DISPOSABLE) ×2 IMPLANT
WATER STERILE IRR 1000ML POUR (IV SOLUTION) ×2 IMPLANT

## 2014-04-11 NOTE — Anesthesia Procedure Notes (Signed)
Procedure Name: Intubation Date/Time: 04/11/2014 10:34 AM Performed by: Ned Grace Pre-anesthesia Checklist: Patient identified, Patient being monitored, Emergency Drugs available, Timeout performed and Suction available Patient Re-evaluated:Patient Re-evaluated prior to inductionOxygen Delivery Method: Circle system utilized Preoxygenation: Pre-oxygenation with 100% oxygen Intubation Type: IV induction Ventilation: Mask ventilation without difficulty and Oral airway inserted - appropriate to patient size Laryngoscope Size: Mac and 4 Grade View: Grade I Tube type: Oral Tube size: 7.5 mm Number of attempts: 1 Airway Equipment and Method: Stylet,  Oral airway and Bite block Placement Confirmation: ETT inserted through vocal cords under direct vision,  breath sounds checked- equal and bilateral and positive ETCO2 Secured at: 24 cm Tube secured with: Tape Dental Injury: Teeth and Oropharynx as per pre-operative assessment

## 2014-04-11 NOTE — Progress Notes (Signed)
Pt. Stated he went to ER yesterday due to chest pain. Stated it wasn't heart related and they released him. Pt. States he has not had any pain since being released from the ER. Notified Dr. Orene Desanctis.

## 2014-04-11 NOTE — H&P (Signed)
Subjective: The patient is a 48 year old black male who's had some chronic neck and back pain. He has had both neck and back surgery. He says some persistent pain. He was worked up with a trial spinal cord stimulator which demonstrated improvement with a lumbar pain but not a cervical pain. I discussed situation with the patient. He has weighed the risks, benefits, and alternatives surgery and decided proceed with placement of spinal cord stimulator.   Past Medical History  Diagnosis Date  . B12 DEFICIENCY 03/06/2007  . CARDIOMYOPATHY, IDIOPATHIC HYPERTROPHIC 03/06/2007  . DEPRESSION 03/06/2007  . GERD 06/01/2007  . HYPERCHOLESTEROLEMIA 03/06/2007  . HYPERLIPIDEMIA 11/08/2008  . IMPAIRED GLUCOSE TOLERANCE 11/08/2008  . NEPHROLITHIASIS, HX OF 04/07/2010  . 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 OSTEOARTHRITIS 06/01/2007  . Palpitations 10/23/2007  . SYNDROME, CHRONIC PAIN 03/06/2007  . Cancer     prostate ca  . Prostate cancer   . HYPERTENSION 03/06/2007    [name]  . Pneumonia     hx of  . Dysrhythmia     sees [name] for "palpitations"  . DVT (deep venous thrombosis)   . Anxiety   . Peripheral vascular disease 03    dvt  . SLEEP APNEA 03/06/2007    uses vpap, last sleep study 2011, Dr. Alanson Puls, at [place] of breath     infreq  . History of kidney stones   . Anginal pain     s/p cardiac cath 07/19/13 showing NL coronaries, EF 50-55%    Past Surgical History  Procedure Laterality Date  . Decompression and fusion      cervicothoracic  . Partial nephrectomy Left     bx  . Cardiac catheterization    . Lumbar fusion    . Prostate surgery      robotic prostatectomy  . Penile prosthesis implant    . Tonsillectomy    . Leg skin lesion  biopsy / excision      left leg  . Eye surgery Bilateral     blepharoplasty    Allergies  Allergen Reactions  . Adhesive [Tape] Other (See Comments)    Skin irriation  . Pollen Extract Other (See Comments)    Stuffy nose     History  Substance Use Topics  . Smoking status: Former Smoker -- 0.50 packs/day for 3 years    Types: Cigarettes    Quit date: 09/07/1983  . Smokeless tobacco: Never Used  . Alcohol Use: No    Family History  Problem Relation Age of Onset  . Heart disease Neg Hx     family hx CHF  . Kidney disease Neg Hx     family hx   . Cancer Neg Hx     family hx prostate   Prior to Admission medications   Medication Sig Start Date End Date Taking? Authorizing Provider  albuterol (PROVENTIL HFA;VENTOLIN HFA) 108 (90 BASE) MCG/ACT inhaler Inhale 2 puffs into the lungs every 6 (six) hours as needed for wheezing or shortness of breath.    Yes Historical Provider, MD  ALPRAZolam Duanne Moron) 0.5 MG tablet Take 0.5 mg by mouth 4 (four) times daily as needed for anxiety.   Yes Historical Provider, MD  amitriptyline (ELAVIL) 25 MG tablet Take 1 tablet (25 mg total) by mouth at bedtime. 09/12/12  Yes Marletta Lor, MD  atenolol (TENORMIN) 25 MG tablet Take 25 mg by mouth 2 (two) times daily.   Yes Historical Provider, MD  atorvastatin (LIPITOR) 40 MG tablet  Take 40 mg by mouth daily.   Yes Historical Provider, MD  azaTHIOprine (IMURAN) 50 MG tablet Take 1 tablet (50 mg total) by mouth 2 (two) times daily. 01/17/14  Yes Marletta Lor, MD  buPROPion (WELLBUTRIN XL) 300 MG 24 hr tablet Take 300 mg by mouth daily.   Yes Historical Provider, MD  cyanocobalamin (COBAL-1000) 1000 MCG/ML injection Inject 1 mL (1,000 mcg total) into the muscle every 30 (thirty) days. 01/30/14  Yes Marletta Lor, MD  cyclobenzaprine (FLEXERIL) 10 MG tablet Take 10 mg by mouth 3 (three) times daily as needed for muscle spasms.  06/06/13  Yes Historical Provider, MD  cycloSPORINE (RESTASIS) 0.05 % ophthalmic emulsion Place 2 drops into both eyes 2 (two) times daily.    Yes Historical Provider, MD  dicyclomine (BENTYL) 10 MG capsule Take 10 mg by mouth 4 (four) times daily.   Yes Historical Provider, MD  docusate sodium  (COLACE) 100 MG capsule Take 100 mg by mouth daily as needed for moderate constipation.    Yes Historical Provider, MD  DULoxetine (CYMBALTA) 30 MG capsule Take 30 mg by mouth daily.   Yes Historical Provider, MD  DULoxetine (CYMBALTA) 60 MG capsule Take 60 mg by mouth daily. Along with 30mg  to =90mg    Yes Historical Provider, MD  gabapentin (NEURONTIN) 300 MG capsule Take 900 mg by mouth 4 (four) times daily.    Yes Historical Provider, MD  guaiFENesin (MUCINEX) 600 MG 12 hr tablet Take 600 mg by mouth 2 (two) times daily.    Yes Historical Provider, MD  Hypromellose (SYSTANE OVERNIGHT THERAPY) 0.3 % GEL Place 1 drop into both eyes at bedtime.   Yes Historical Provider, MD  lidocaine (LIDODERM) 5 % Place 1 patch onto the skin daily.    Yes Historical Provider, MD  NEXIUM 40 MG capsule Take 40 mg by mouth daily.  06/12/13  Yes Historical Provider, MD  oxybutynin (DITROPAN) 5 MG tablet Take 5 mg by mouth daily.   Yes Historical Provider, MD  oxyCODONE-acetaminophen (PERCOCET) 10-325 MG per tablet Take 1 tablet by mouth every 6 (six) hours as needed for pain.   Yes Historical Provider, MD  Polyethyl Glycol-Propyl Glycol (SYSTANE OP) Place 2 drops into both eyes 2 (two) times daily.   Yes Historical Provider, MD  predniSONE (DELTASONE) 5 MG tablet Take 5 mg by mouth daily.    Yes Historical Provider, MD  traMADol (ULTRAM) 50 MG tablet Take 50 mg by mouth every 6 (six) hours as needed for moderate pain.   Yes Historical Provider, MD  triamcinolone (NASACORT AQ) 55 MCG/ACT AERO nasal inhaler Place 1 spray into both nostrils daily.    Yes Historical Provider, MD  triamcinolone ointment (KENALOG) 0.1 % Apply 1 application topically 2 (two) times daily.   Yes Historical Provider, MD  promethazine (PHENERGAN) 25 MG tablet Take 25 mg by mouth every 6 (six) hours as needed for nausea.     Historical Provider, MD     Review of Systems  Positive ROS: As above  All other systems have been reviewed and were  otherwise negative with the exception of those mentioned in the HPI and as above.  Objective: Vital signs in last 24 hours: Temp:  [98.2 F (36.8 C)] 98.2 F (36.8 C) (08/06 0742) Pulse Rate:  [76-100] 76 (08/06 0742) Resp:  [20] 20 (08/06 0742) BP: (116-133)/(59-64) 133/59 mmHg (08/06 0742) SpO2:  [99 %] 99 % (08/06 0742) Weight:  [104.327 kg (230 lb)] 104.327 kg (230  lb) (08/06 0742)  General Appearance: Alert, cooperative, no distress, Head: Normocephalic, without obvious abnormality, atraumatic Eyes: PERRL, conjunctiva/corneas clear, EOM's intact,    Ears: Normal  Throat: Normal  Neck: Supple, symmetrical, trachea midline, no adenopathy; thyroid: No enlargement/tenderness/nodules; no carotid bruit or JVD. The patient's cervical incision is well-healed. Back: Symmetric, no curvature, ROM normal, no CVA tenderness the patient's incision is well-healed Lungs: Clear to auscultation bilaterally, respirations unlabored Heart: Regular rate and rhythm, no murmur, rub or gallop Abdomen: Soft, non-tender,, no masses, no organomegaly Extremities: Extremities normal, atraumatic, no cyanosis or edema Pulses: 2+ and symmetric all extremities Skin: Skin color, texture, turgor normal, no rashes or lesions  NEUROLOGIC:   Mental status: alert and oriented, no aphasia, good attention span, Fund of knowledge/ memory ok Motor Exam - grossly normal Sensory Exam - grossly normal Reflexes:  Coordination - grossly normal Gait - grossly normal Balance - grossly normal Cranial Nerves: I: smell Not tested  II: visual acuity  OS: Normal  OD: Normal   II: visual fields Full to confrontation  II: pupils Equal, round, reactive to light  III,VII: ptosis None  III,IV,VI: extraocular muscles  Full ROM  V: mastication Normal  V: facial light touch sensation  Normal  V,VII: corneal reflex  Present  VII: facial muscle function - upper  Normal  VII: facial muscle function - lower Normal  VIII: hearing  Not tested  IX: soft palate elevation  Normal  IX,X: gag reflex Present  XI: trapezius strength  5/5  XI: sternocleidomastoid strength 5/5  XI: neck flexion strength  5/5  XII: tongue strength  Normal    Data Review Lab Results  Component Value Date   WBC 6.1 04/10/2014   HGB 14.3 04/10/2014   HCT 42.0 04/10/2014   MCV 88.2 04/10/2014   PLT 224 04/10/2014   Lab Results  Component Value Date   NA 139 04/10/2014   K 3.7 04/10/2014   CL 104 04/10/2014   CO2 26 04/05/2014   BUN 11 04/10/2014   CREATININE 1.00 04/10/2014   GLUCOSE 98 04/10/2014   Lab Results  Component Value Date   INR 1.21 07/19/2013    Assessment/Plan: Chronic lumbago: I have discussed situation with the patient. We have discussed the various treatment options including placement of a spinal cord stimulator. I described the procedure. We discussed the risks, benefits, alternatives, and likelihood of achieving our goals with surgery. I have answered all the patient's questions. He has decided proceed with surgery.   Jari Dipasquale D 04/11/2014 10:13 AM

## 2014-04-11 NOTE — Addendum Note (Signed)
Addendum created 04/11/14 1523 by Ned Grace, CRNA   Modules edited: Anesthesia Flowsheet

## 2014-04-11 NOTE — Transfer of Care (Signed)
Immediate Anesthesia Transfer of Care Note  Patient: [name].  Procedure(s) Performed: Procedure(s): LUMBAR SPINAL CORD STIMULATOR INSERTION (N/A)  Patient Location: PACU  Anesthesia Type:General  Level of Consciousness: awake, alert , oriented and patient cooperative  Airway & Oxygen Therapy: Patient Spontanous Breathing and Patient connected to nasal cannula oxygen  Post-op Assessment: Report given to PACU RN, Post -op Vital signs reviewed and stable and Patient moving all extremities X 4  Post vital signs: Reviewed and stable  Complications: No apparent anesthesia complications

## 2014-04-11 NOTE — Anesthesia Preprocedure Evaluation (Signed)
Anesthesia Evaluation  Patient identified by MRN, date of birth, ID band Patient awake    Reviewed: Allergy & Precautions, H&P , NPO status , Patient's Chart, lab work & pertinent test results  Airway Mallampati: II  Neck ROM: Full    Dental   Pulmonary shortness of breath, sleep apnea , former smoker,  breath sounds clear to auscultation        Cardiovascular hypertension, + angina + Peripheral Vascular Disease + dysrhythmias Rhythm:Regular Rate:Normal  Nl coronaries by cath,nl EF   Neuro/Psych    GI/Hepatic GERD-  ,  Endo/Other    Renal/GU      Musculoskeletal  (+) Arthritis -, Chronic pain   Abdominal   Peds  Hematology   Anesthesia Other Findings   Reproductive/Obstetrics                           Anesthesia Physical Anesthesia Plan  ASA: III  Anesthesia Plan: General   Post-op Pain Management:    Induction: Intravenous  Airway Management Planned: Oral ETT  Additional Equipment:   Intra-op Plan:   Post-operative Plan: Extubation in OR  Informed Consent: I have reviewed the patients History and Physical, chart, labs and discussed the procedure including the risks, benefits and alternatives for the proposed anesthesia with the patient or authorized representative who has indicated his/her understanding and acceptance.   Dental advisory given  Plan Discussed with: CRNA and Surgeon  Anesthesia Plan Comments:         Anesthesia Quick Evaluation

## 2014-04-11 NOTE — Progress Notes (Signed)
Subjective:  The patient is somnolent but easily arousable. He is in no apparent distress. He looks well.  Objective: Vital signs in last 24 hours: Temp:  [98.2 F (36.8 C)] 98.2 F (36.8 C) (08/06 0742) Pulse Rate:  [76] 76 (08/06 0742) Resp:  [20] 20 (08/06 0742) BP: (133)/(59) 133/59 mmHg (08/06 0742) SpO2:  [99 %] 99 % (08/06 0742) Weight:  [104.327 kg (230 lb)] 104.327 kg (230 lb) (08/06 0742)  Intake/Output from previous day:   Intake/Output this shift: Total I/O In: 1000 [I.V.:1000] Out: 50 [Blood:50]  Physical exam the patient is somnolent but easily arousable. He is moving his lower extremities well.  Lab Results:  Recent Labs  04/10/14 0628 04/10/14 0639  WBC 6.1  --   HGB 13.7 14.3  HCT 41.0 42.0  PLT 224  --    BMET  Recent Labs  04/10/14 0639  NA 139  K 3.7  CL 104  GLUCOSE 98  BUN 11  CREATININE 1.00    Studies/Results: Dg Chest 2 View  04/10/2014   CLINICAL DATA:  Chest pain  EXAM: CHEST  2 VIEW  COMPARISON:  07/18/2013  FINDINGS: No cardiomegaly. Prominent hila which may reflect on going lymphadenopathy (noted on chest CT 09/09/2012). There is no edema, consolidation, effusion, or pneumothorax. No osseous findings to explain chest pain.  IMPRESSION: No active cardiopulmonary disease.   Electronically Signed   By: Jorje Guild M.D.   On: 04/10/2014 06:53   Dg Lumbar Spine 1 View  04/11/2014   CLINICAL DATA:  Stimulator placement  EXAM: LUMBAR SPINE - 1 VIEW; DG C-ARM 61-120 MIN  COMPARISON:  None.  FINDINGS: Single frontal view shows stimulator lead tips in the mid to lower thoracic region. Exact level cannot be ascertained on this single view. Visualized bony structures appear intact.  IMPRESSION: Stimulator lead tips in mid to lower thoracic region; exact level cannot be ascertained on this limited frontal only view.   Electronically Signed   By: Lowella Grip M.D.   On: 04/11/2014 12:18   Dg C-arm 1-60 Min  04/11/2014   CLINICAL DATA:   Stimulator placement  EXAM: LUMBAR SPINE - 1 VIEW; DG C-ARM 61-120 MIN  COMPARISON:  None.  FINDINGS: Single frontal view shows stimulator lead tips in the mid to lower thoracic region. Exact level cannot be ascertained on this single view. Visualized bony structures appear intact.  IMPRESSION: Stimulator lead tips in mid to lower thoracic region; exact level cannot be ascertained on this limited frontal only view.   Electronically Signed   By: Lowella Grip M.D.   On: 04/11/2014 12:18    Assessment/Plan: The patient is doing well.  LOS: 0 days     Teigen Bellin D 04/11/2014, 12:33 PM

## 2014-04-11 NOTE — Plan of Care (Signed)
Problem: Consults Goal: Diagnosis - Spinal Surgery Outcome: Completed/Met Date Met:  04/11/14 LUMBAR SPINAL CORD STIMULATOR INSERTION

## 2014-04-11 NOTE — Anesthesia Postprocedure Evaluation (Signed)
  Anesthesia Post-op Note  Patient: [name].  Procedure(s) Performed: Procedure(s): LUMBAR SPINAL CORD STIMULATOR INSERTION (N/A)  Patient Location: PACU  Anesthesia Type:General  Level of Consciousness: awake, alert  and sedated  Airway and Oxygen Therapy: Patient Spontanous Breathing  Post-op Pain: mild  Post-op Assessment: Post-op Vital signs reviewed  Post-op Vital Signs: stable  Last Vitals:  Filed Vitals:   04/11/14 1314  BP: 137/86  Pulse: 81  Temp:   Resp: 53    Complications: No apparent anesthesia complications

## 2014-04-12 DIAGNOSIS — G8929 Other chronic pain: Secondary | ICD-10-CM | POA: Diagnosis not present

## 2014-04-12 MED ORDER — OXYCODONE-ACETAMINOPHEN 10-325 MG PO TABS: 1.0000 | ORAL_TABLET | ORAL | Status: DC | PRN

## 2014-04-12 NOTE — Discharge Summary (Signed)
Physician Discharge Summary  Patient ID: [name]. MRN: [id number] DOB/AGE: 49/21/1967 49 y.o.  Admit date: 04/11/2014 Discharge date: 04/12/2014  Admission Diagnoses: Lumbago, lumbar radiculopathy  Discharge Diagnoses: The same Active Problems:   Back pain   Discharged Condition: good  Hospital Course: I placed a [employer] spinal cord stimulator in the patient on 04/11/2014. The surgery went well.  The patient's postoperative course was unremarkable. On postop day #1 the patient requested discharge to home. He was given oral and written discharge instructions. All his questions were answered.  Consults: None Significant Diagnostic Studies: None Treatments: Placement of [employer] spinal cord stimulator Discharge Exam: Blood pressure 123/67, pulse 77, temperature 97.8 F (36.6 C), temperature source Oral, resp. rate 20, height 5\' 9"  (1.753 m), weight 104.327 kg (230 lb), SpO2 95.00%. Patient is alert and pleasant. His strength is normal.  Disposition: Home  Discharge Instructions   Call MD for:  difficulty breathing, headache or visual disturbances    Complete by:  As directed      Call MD for:  extreme fatigue    Complete by:  As directed      Call MD for:  hives    Complete by:  As directed      Call MD for:  persistant dizziness or light-headedness    Complete by:  As directed      Call MD for:  persistant nausea and vomiting    Complete by:  As directed      Call MD for:  redness, tenderness, or signs of infection (pain, swelling, redness, odor or green/yellow discharge around incision site)    Complete by:  As directed      Call MD for:  severe uncontrolled pain    Complete by:  As directed      Call MD for:  temperature >100.4    Complete by:  As directed      Diet - low sodium heart healthy    Complete by:  As directed      Discharge instructions    Complete by:  As directed   Call [phone] for a followup appointment. Take a stool  softener while you are using pain medications.     Driving Restrictions    Complete by:  As directed   Do not drive for 2 weeks.     Increase activity slowly    Complete by:  As directed      Lifting restrictions    Complete by:  As directed   Do not lift more than 5 pounds. No excessive bending or twisting.     May shower / Bathe    Complete by:  As directed   He may shower after the pain she is removed 3 days after surgery. Leave the incision alone.     Remove dressing in 48 hours    Complete by:  As directed   Your stitches are under the scan and will dissolve by themselves. The Steri-Strips will fall off after you take a few showers. Do not rub back or pick at the wound, Leave the wound alone.            Medication List    STOP taking these medications       LIDODERM 5 %  Generic drug:  lidocaine      TAKE these medications       albuterol 108 (90 BASE) MCG/ACT inhaler  Commonly known as:  PROVENTIL HFA;VENTOLIN HFA  Inhale 2 puffs into  the lungs every 6 (six) hours as needed for wheezing or shortness of breath.     ALPRAZolam 0.5 MG tablet  Commonly known as:  XANAX  Take 0.5 mg by mouth 4 (four) times daily as needed for anxiety.     amitriptyline 25 MG tablet  Commonly known as:  ELAVIL  Take 1 tablet (25 mg total) by mouth at bedtime.     atenolol 25 MG tablet  Commonly known as:  TENORMIN  Take 25 mg by mouth 2 (two) times daily.     atorvastatin 40 MG tablet  Commonly known as:  LIPITOR  Take 40 mg by mouth daily.     azaTHIOprine 50 MG tablet  Commonly known as:  IMURAN  Take 1 tablet (50 mg total) by mouth 2 (two) times daily.     buPROPion 300 MG 24 hr tablet  Commonly known as:  WELLBUTRIN XL  Take 300 mg by mouth daily.     cyanocobalamin 1000 MCG/ML injection  Commonly known as:  COBAL-1000  Inject 1 mL (1,000 mcg total) into the muscle every 30 (thirty) days.     cyclobenzaprine 10 MG tablet  Commonly known as:  FLEXERIL  Take 10 mg by  mouth 3 (three) times daily as needed for muscle spasms.     cycloSPORINE 0.05 % ophthalmic emulsion  Commonly known as:  RESTASIS  Place 2 drops into both eyes 2 (two) times daily.     dicyclomine 10 MG capsule  Commonly known as:  BENTYL  Take 10 mg by mouth 4 (four) times daily.     docusate sodium 100 MG capsule  Commonly known as:  COLACE  Take 100 mg by mouth daily as needed for moderate constipation.     DULoxetine 30 MG capsule  Commonly known as:  CYMBALTA  Take 30 mg by mouth daily.     DULoxetine 60 MG capsule  Commonly known as:  CYMBALTA  Take 60 mg by mouth daily. Along with 30mg  to =90mg      gabapentin 300 MG capsule  Commonly known as:  NEURONTIN  Take 900 mg by mouth 4 (four) times daily.     guaiFENesin 600 MG 12 hr tablet  Commonly known as:  MUCINEX  Take 600 mg by mouth 2 (two) times daily.     NASACORT AQ 55 MCG/ACT Aero nasal inhaler  Generic drug:  triamcinolone  Place 1 spray into both nostrils daily.     NEXIUM 40 MG capsule  Generic drug:  esomeprazole  Take 40 mg by mouth daily.     oxybutynin 5 MG tablet  Commonly known as:  DITROPAN  Take 5 mg by mouth daily.     oxyCODONE-acetaminophen 10-325 MG per tablet  Commonly known as:  PERCOCET  Take 1 tablet by mouth every 6 (six) hours as needed for pain.     oxyCODONE-acetaminophen 10-325 MG per tablet  Commonly known as:  PERCOCET  Take 1 tablet by mouth every 4 (four) hours as needed for pain.     predniSONE 5 MG tablet  Commonly known as:  DELTASONE  Take 5 mg by mouth daily.     promethazine 25 MG tablet  Commonly known as:  PHENERGAN  Take 25 mg by mouth every 6 (six) hours as needed for nausea.     SYSTANE OP  Place 2 drops into both eyes 2 (two) times daily.     SYSTANE OVERNIGHT THERAPY 0.3 % Gel  Generic drug:  Hypromellose  Place 1 drop into both eyes at bedtime.     traMADol 50 MG tablet  Commonly known as:  ULTRAM  Take 50 mg by mouth every 6 (six) hours as needed  for moderate pain.     triamcinolone ointment 0.1 %  Commonly known as:  KENALOG  Apply 1 application topically 2 (two) times daily.         SignedNewman Pies D 04/12/2014, 11:01 AM

## 2014-04-12 NOTE — Op Note (Signed)
Brief history: The patient is a 48 year old black male who has complained of chronic back pain. He's had several surgeries. He underwent a spinal cord stimulator trial which demonstrated good relief of his back pain. I discussed situation with him and he decided proceed with placement of a permanent spinal cord stimulator.  Preop diagnosis: Lumbago  Postop diagnosis: The same  Procedure: Insertion of [employer] spinal cord stimulator  Surgeon: Dr. Earle Gell  Assistant: None  Anesthesia: Gen. endotracheal  Estimated blood loss: Minimal  Specimens: None  Drains: None  Complications: None  Description of procedure: The patient was brought to the operating room by the anesthesia team. General endotracheal anesthesia was induced. The patient was turned to the prone position on the chest rolls. His thoracic or lumbar region was then prepared with Betadine scrub and Betadine solution. Sterile drapes were applied. I then injected the area to be incised with Marcaine with epinephrine solution. I made a linear midline incision over the T9-10 interspace. I used electrocautery to perform a left sided subperiosteal dissection exposing the left spinous process and lamina of T9-10. We used intraoperative fluoroscopy to confirm our location. I inserted the McCullough retractor for exposure.  I then used a high-speed drill to perform a left T9 laminotomy. I widen the laminotomy with Kerrison punch and removed the T9-10 ligamentum flavum. I then dissected through the epidural fat and expose the underlying dura. I used the paddle tennis her to create a space for the leads. I then inserted the leads and the epidural space. Confirmed good position using intraoperative fluoroscopy.  I then made an incision in the patient's right flank. I create a subcutaneous pocket for the generator. I passed the wires from the thoracic incision to the flank incision using the shunt passer. I then connected the leads  to the generator. We secured the leads with the nuts appropriately. We confirmed the generator was working using telemetry. I then placed a generator in the subcutaneous pocket. We then removed the retractors and reapproximated patient's thoracic fascia with interrupted #1 Vicryl suture. We reapproximated the subcutaneous tissue in the thoracic and flank incision using interrupted 2-0 Vicryl suture. We reapproximated the skin with Steri-Strips and benzoin. The wounds were then coated with bacitracin ointment. A sterile dressing was applied. The drapes were removed. The patient was subsequently returned to the supine position where he was extubated by the anesthesia team. He was then transported to the post anesthesia care unit in stable condition. By report all sponge, instrument, and needle counts were correct at the end of this case.

## 2014-04-12 NOTE — ED Provider Notes (Signed)
Medical screening examination/treatment/procedure(s) were performed by non-physician practitioner and as supervising physician I was immediately available for consultation/collaboration.   EKG Interpretation   Date/Time:  Wednesday April 10 2014 06:13:34 EDT Ventricular Rate:  114 PR Interval:  172 QRS Duration: 92 QT Interval:  318 QTC Calculation: 438 R Axis:   -18 Text Interpretation:  Sinus tachycardia Cannot rule out Anteroseptal  infarct , age undetermined Abnormal ECG No significant change since last  tracing Confirmed by Seldon Barrell  MD, [place] [phone]) on 04/10/2014 10:10:45 AM        Fredia Sorrow, MD 04/12/14 1334

## 2014-04-12 NOTE — Progress Notes (Signed)
Patient alert and oriented, mae's well, voiding adequate amount of urine, swallowing without difficulty, c/o moderate pain and meds given prior to discharged. Patient discharged home with family. Script and discharged instructions given to patient. Patient and family stated understanding of instructions given.  

## 2014-04-12 NOTE — Evaluation (Signed)
Physical Therapy Evaluation Patient Details Name: [name]. MRN: [id number] DOB: 1965/11/29 Today's Date: 04/12/2014   History of Present Illness  Adm 04/11/14 for lumbar spinal stimulator insertion. PMHx- multiple neck and lumbar surgeries, neurosarcoidosis, cardiomyopathy, OA, prostate Ca, DVT, dysrhythmia  Clinical Impression  Patient evaluated by Physical Therapy with no further acute PT needs identified. All education has been completed and the patient has no further questions.  See below for any follow-up Physial Therapy or equipment needs. PT is signing off. Thank you for this referral.     Follow Up Recommendations No PT follow up    Equipment Recommendations  None recommended by PT    Recommendations for Other Services       Precautions / Restrictions Precautions Precautions: Back (educated for comfort while healing) Precaution Booklet Issued: Yes (comment)      Mobility  Bed Mobility Overal bed mobility: Modified Independent             General bed mobility comments: uses proper log-rolling and side tosit  Transfers Overall transfer level: Independent Equipment used: None                Ambulation/Gait Ambulation/Gait assistance: Independent Ambulation Distance (Feet): 200 Feet Assistive device: None Gait Pattern/deviations: WFL(Within Functional Limits)        Stairs Stairs: Yes Stairs assistance: Supervision Stair Management: One rail Right;Forwards;Step to pattern;Alternating pattern Number of Stairs: 10 General stair comments: pt alternates ascending and step-to with descending  Wheelchair Mobility    Modified Rankin (Stroke Patients Only)       Balance                                             Pertinent Vitals/Pain Pain Assessment: 0-10 Pain Score: 5  Pain Location: low back Pain Descriptors / Indicators: Sore Pain Intervention(s): Repositioned;Monitored during session    Home Living  Family/patient expects to be discharged to:: Private residence Living Arrangements: Spouse/significant other;Children Available Help at Discharge: Family Type of Home: House Home Access: Level entry     Home Layout: Two level Home Equipment: Adaptive equipment      Prior Function Level of Independence: Independent               Hand Dominance        Extremity/Trunk Assessment   Upper Extremity Assessment: Overall WFL for tasks assessed           Lower Extremity Assessment: Overall WFL for tasks assessed      Cervical / Trunk Assessment: Normal  Communication   Communication: No difficulties  Cognition Arousal/Alertness: Awake/alert Behavior During Therapy: WFL for tasks assessed/performed Overall Cognitive Status: Within Functional Limits for tasks assessed                      General Comments General comments (skin integrity, edema, etc.): Provided back handout and educated these are good "rules" to follow for general back protection and while incision healing.     Exercises        Assessment/Plan    PT Assessment Patent does not need any further PT services  PT Diagnosis     PT Problem List    PT Treatment Interventions     PT Goals (Current goals can be found in the Care Plan section) Acute Rehab PT Goals Patient Stated Goal: return to work in 2  weeks PT Goal Formulation: No goals set, d/c therapy    Frequency     Barriers to discharge        Co-evaluation               End of Session   Activity Tolerance: Patient tolerated treatment well Patient left: in bed;with call bell/phone within reach      Functional Assessment Tool Used: clinical judgement Functional Limitation: Mobility: Walking and moving around Mobility: Walking and Moving Around Current Status ([id number]): At least 1 percent but less than 20 percent impaired, limited or restricted Mobility: Walking and Moving Around Goal Status [phone]): At least 1 percent but  less than 20 percent impaired, limited or restricted Mobility: Walking and Moving Around Discharge Status [phone]): At least 1 percent but less than 20 percent impaired, limited or restricted    Time: 0905-0922 PT Time Calculation (min): 17 min   Charges:   PT Evaluation $Initial PT Evaluation Tier I: 1 Procedure PT Treatments $Gait Training: 8-22 mins   PT G Codes:   Functional Assessment Tool Used: clinical judgement Functional Limitation: Mobility: Walking and moving around    [name] 04/12/2014, 10:25 AM Pager [phone]

## 2014-04-15 DIAGNOSIS — Z8249 Family history of ischemic heart disease and other diseases of the circulatory system: Secondary | ICD-10-CM | POA: Diagnosis not present

## 2014-04-15 DIAGNOSIS — C61 Malignant neoplasm of prostate: Secondary | ICD-10-CM | POA: Diagnosis not present

## 2014-04-15 DIAGNOSIS — Z8659 Personal history of other mental and behavioral disorders: Secondary | ICD-10-CM | POA: Diagnosis not present

## 2014-04-15 DIAGNOSIS — Z0100 Encounter for examination of eyes and vision without abnormal findings: Secondary | ICD-10-CM | POA: Diagnosis not present

## 2014-04-15 DIAGNOSIS — Z87891 Personal history of nicotine dependence: Secondary | ICD-10-CM | POA: Diagnosis not present

## 2014-04-15 DIAGNOSIS — H01009 Unspecified blepharitis unspecified eye, unspecified eyelid: Secondary | ICD-10-CM | POA: Diagnosis not present

## 2014-04-15 DIAGNOSIS — H53009 Unspecified amblyopia, unspecified eye: Secondary | ICD-10-CM | POA: Diagnosis not present

## 2014-04-15 DIAGNOSIS — Z8042 Family history of malignant neoplasm of prostate: Secondary | ICD-10-CM | POA: Diagnosis not present

## 2014-04-15 DIAGNOSIS — H04129 Dry eye syndrome of unspecified lacrimal gland: Secondary | ICD-10-CM | POA: Insufficient documentation

## 2014-04-15 DIAGNOSIS — Z841 Family history of disorders of kidney and ureter: Secondary | ICD-10-CM | POA: Diagnosis not present

## 2014-04-15 DIAGNOSIS — H269 Unspecified cataract: Secondary | ICD-10-CM | POA: Diagnosis not present

## 2014-04-15 DIAGNOSIS — H0289 Other specified disorders of eyelid: Secondary | ICD-10-CM | POA: Diagnosis not present

## 2014-04-15 DIAGNOSIS — D869 Sarcoidosis, unspecified: Secondary | ICD-10-CM | POA: Diagnosis not present

## 2014-04-16 ENCOUNTER — Other Ambulatory Visit: Payer: Self-pay | Admitting: Internal Medicine

## 2014-04-17 ENCOUNTER — Encounter (HOSPITAL_COMMUNITY): Payer: Self-pay | Admitting: Neurosurgery

## 2014-04-18 ENCOUNTER — Encounter (HOSPITAL_COMMUNITY): Payer: Self-pay | Admitting: Emergency Medicine

## 2014-04-18 DIAGNOSIS — E538 Deficiency of other specified B group vitamins: Secondary | ICD-10-CM | POA: Diagnosis not present

## 2014-04-18 DIAGNOSIS — G478 Other sleep disorders: Secondary | ICD-10-CM | POA: Diagnosis not present

## 2014-04-18 DIAGNOSIS — N3289 Other specified disorders of bladder: Secondary | ICD-10-CM | POA: Diagnosis not present

## 2014-04-18 DIAGNOSIS — E785 Hyperlipidemia, unspecified: Secondary | ICD-10-CM | POA: Diagnosis not present

## 2014-04-18 DIAGNOSIS — M159 Polyosteoarthritis, unspecified: Secondary | ICD-10-CM | POA: Insufficient documentation

## 2014-04-18 DIAGNOSIS — Z87891 Personal history of nicotine dependence: Secondary | ICD-10-CM | POA: Diagnosis not present

## 2014-04-18 DIAGNOSIS — J9819 Other pulmonary collapse: Secondary | ICD-10-CM | POA: Diagnosis not present

## 2014-04-18 DIAGNOSIS — J029 Acute pharyngitis, unspecified: Secondary | ICD-10-CM | POA: Insufficient documentation

## 2014-04-18 DIAGNOSIS — G8918 Other acute postprocedural pain: Secondary | ICD-10-CM | POA: Insufficient documentation

## 2014-04-18 DIAGNOSIS — K219 Gastro-esophageal reflux disease without esophagitis: Secondary | ICD-10-CM | POA: Insufficient documentation

## 2014-04-18 DIAGNOSIS — Z86718 Personal history of other venous thrombosis and embolism: Secondary | ICD-10-CM | POA: Diagnosis not present

## 2014-04-18 DIAGNOSIS — I10 Essential (primary) hypertension: Secondary | ICD-10-CM | POA: Diagnosis not present

## 2014-04-18 DIAGNOSIS — Z87442 Personal history of urinary calculi: Secondary | ICD-10-CM | POA: Insufficient documentation

## 2014-04-18 DIAGNOSIS — F411 Generalized anxiety disorder: Secondary | ICD-10-CM | POA: Insufficient documentation

## 2014-04-18 DIAGNOSIS — R0789 Other chest pain: Secondary | ICD-10-CM | POA: Diagnosis not present

## 2014-04-18 DIAGNOSIS — I499 Cardiac arrhythmia, unspecified: Secondary | ICD-10-CM | POA: Diagnosis not present

## 2014-04-18 DIAGNOSIS — IMO0002 Reserved for concepts with insufficient information to code with codable children: Secondary | ICD-10-CM | POA: Insufficient documentation

## 2014-04-18 DIAGNOSIS — B370 Candidal stomatitis: Secondary | ICD-10-CM | POA: Diagnosis not present

## 2014-04-18 DIAGNOSIS — Z9889 Other specified postprocedural states: Secondary | ICD-10-CM | POA: Insufficient documentation

## 2014-04-18 DIAGNOSIS — Z79899 Other long term (current) drug therapy: Secondary | ICD-10-CM | POA: Insufficient documentation

## 2014-04-18 DIAGNOSIS — Z9981 Dependence on supplemental oxygen: Secondary | ICD-10-CM | POA: Diagnosis not present

## 2014-04-18 DIAGNOSIS — F329 Major depressive disorder, single episode, unspecified: Secondary | ICD-10-CM | POA: Diagnosis not present

## 2014-04-18 DIAGNOSIS — I209 Angina pectoris, unspecified: Secondary | ICD-10-CM | POA: Insufficient documentation

## 2014-04-18 DIAGNOSIS — F3289 Other specified depressive episodes: Secondary | ICD-10-CM | POA: Insufficient documentation

## 2014-04-18 DIAGNOSIS — M549 Dorsalgia, unspecified: Secondary | ICD-10-CM | POA: Diagnosis present

## 2014-04-18 DIAGNOSIS — Z8546 Personal history of malignant neoplasm of prostate: Secondary | ICD-10-CM | POA: Diagnosis not present

## 2014-04-18 DIAGNOSIS — Z8701 Personal history of pneumonia (recurrent): Secondary | ICD-10-CM | POA: Diagnosis not present

## 2014-04-18 DIAGNOSIS — I739 Peripheral vascular disease, unspecified: Secondary | ICD-10-CM | POA: Insufficient documentation

## 2014-04-18 LAB — RAPID STREP SCREEN (MED CTR MEBANE ONLY): Streptococcus, Group A Screen (Direct): NEGATIVE

## 2014-04-18 LAB — CBC WITH DIFFERENTIAL/PLATELET
Basophils Absolute: 0 10*3/uL (ref 0.0–0.1)
Basophils Relative: 1 % (ref 0–1)
Eosinophils Absolute: 0.2 10*3/uL (ref 0.0–0.7)
Eosinophils Relative: 3 % (ref 0–5)
HCT: 40.9 % (ref 39.0–52.0)
Hemoglobin: 13.6 g/dL (ref 13.0–17.0)
Lymphocytes Relative: 18 % (ref 12–46)
Lymphs Abs: 1.1 10*3/uL (ref 0.7–4.0)
MCH: 28.9 pg (ref 26.0–34.0)
MCHC: 33.3 g/dL (ref 30.0–36.0)
MCV: 87 fL (ref 78.0–100.0)
Monocytes Absolute: 0.7 10*3/uL (ref 0.1–1.0)
Monocytes Relative: 11 % (ref 3–12)
Neutro Abs: 4.1 10*3/uL (ref 1.7–7.7)
Neutrophils Relative %: 67 % (ref 43–77)
Platelets: 336 10*3/uL (ref 150–400)
RBC: 4.7 MIL/uL (ref 4.22–5.81)
RDW: 13.8 % (ref 11.5–15.5)
WBC: 6 10*3/uL (ref 4.0–10.5)

## 2014-04-18 LAB — BASIC METABOLIC PANEL
Anion gap: 13 (ref 5–15)
BUN: 9 mg/dL (ref 6–23)
CO2: 26 mEq/L (ref 19–32)
Calcium: 9.3 mg/dL (ref 8.4–10.5)
Chloride: 102 mEq/L (ref 96–112)
Creatinine, Ser: 0.92 mg/dL (ref 0.50–1.35)
GFR calc Af Amer: >90 mL/min (ref >90)
GFR calc non Af Amer: >90 mL/min (ref >90)
Glucose, Bld: 95 mg/dL (ref 70–99)
Potassium: 4.4 mEq/L (ref 3.7–5.3)
Sodium: 141 mEq/L (ref 137–147)

## 2014-04-18 NOTE — ED Notes (Signed)
Presents one week post op of spine stimulator-redness noted to lower right flank incision and upper incision-pt reports increase in pain, chills and nausea began 2 days ago. C/o sore throat for 2 days and exudate to tongue-sore throat is owrse with swallowing-unable to eat or drink due to pain. Redness noted to throat.

## 2014-04-19 ENCOUNTER — Emergency Department (HOSPITAL_COMMUNITY)
Admission: EM | Admit: 2014-04-19 | Discharge: 2014-04-19 | Disposition: A | Discharge status: Home / Self Care | Payer: Medicare Other | Attending: Emergency Medicine | Admitting: Emergency Medicine

## 2014-04-19 ENCOUNTER — Emergency Department (HOSPITAL_COMMUNITY): Payer: Medicare Other

## 2014-04-19 DIAGNOSIS — N3289 Other specified disorders of bladder: Secondary | ICD-10-CM | POA: Diagnosis not present

## 2014-04-19 DIAGNOSIS — G8918 Other acute postprocedural pain: Secondary | ICD-10-CM

## 2014-04-19 DIAGNOSIS — J9819 Other pulmonary collapse: Secondary | ICD-10-CM | POA: Diagnosis not present

## 2014-04-19 DIAGNOSIS — B370 Candidal stomatitis: Secondary | ICD-10-CM

## 2014-04-19 MED ORDER — FLUCONAZOLE 100 MG PO TABS
150.0000 mg | ORAL_TABLET | Freq: Once | ORAL | Status: AC
  Administered 2014-04-19: 150 mg via ORAL
  Filled 2014-04-19: qty 2

## 2014-04-19 MED ORDER — SODIUM CHLORIDE 0.9 % IV BOLUS (SEPSIS)
1000.0000 mL | Freq: Once | INTRAVENOUS | Status: AC
  Administered 2014-04-19: 1000 mL via INTRAVENOUS

## 2014-04-19 MED ORDER — HYDROMORPHONE HCL PF 1 MG/ML IJ SOLN
1.0000 mg | Freq: Once | INTRAMUSCULAR | Status: AC
  Administered 2014-04-19: 1 mg via INTRAVENOUS
  Filled 2014-04-19: qty 1

## 2014-04-19 MED ORDER — IOHEXOL 300 MG/ML  SOLN
100.0000 mL | Freq: Once | INTRAMUSCULAR | Status: AC | PRN
  Administered 2014-04-19: 100 mL via INTRAVENOUS

## 2014-04-19 MED ORDER — NYSTATIN 100000 UNIT/ML MT SUSP: 500000.0000 [IU] | Freq: Four times a day (QID) | OROMUCOSAL | Status: DC

## 2014-04-19 MED ORDER — OXYCODONE-ACETAMINOPHEN 5-325 MG PO TABS
2.0000 | ORAL_TABLET | Freq: Once | ORAL | Status: AC
  Administered 2014-04-19: 2 via ORAL
  Filled 2014-04-19: qty 2

## 2014-04-19 MED ORDER — IBUPROFEN 600 MG PO TABS: 600.0000 mg | ORAL_TABLET | Freq: Four times a day (QID) | ORAL | Status: DC | PRN

## 2014-04-19 NOTE — ED Provider Notes (Signed)
CSN: [id number]     Arrival date & time 04/18/14  2014 History   First MD Initiated Contact with Patient 04/19/14 0123     Chief Complaint  Patient presents with  . Post-op Problem  . Sore Throat     (Consider location/radiation/quality/duration/timing/severity/associated sxs/prior Treatment) HPI Comments: Pt comes in with cc of back pain. Pt is post op day 9 from a spine nerve stimulator placement for pain. Pt reports that since the surgery, he has had back pain - right at the incision site, and it is moving to his chest. The pain is constant. Pt also c/o sore throat. Pain is worse with swallowing. No n/v/f/c.  Patient is a 48 y.o. male presenting with pharyngitis. The history is provided by the patient and medical records.  Sore Throat Associated symptoms include chest pain and abdominal pain. Pertinent negatives include no shortness of breath.    Past Medical History  Diagnosis Date  . B12 DEFICIENCY 03/06/2007  . CARDIOMYOPATHY, IDIOPATHIC HYPERTROPHIC 03/06/2007  . DEPRESSION 03/06/2007  . GERD 06/01/2007  . HYPERCHOLESTEROLEMIA 03/06/2007  . HYPERLIPIDEMIA 11/08/2008  . IMPAIRED GLUCOSE TOLERANCE 11/08/2008  . NEPHROLITHIASIS, HX OF 04/07/2010  . 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 OSTEOARTHRITIS 06/01/2007  . Palpitations 10/23/2007  . SYNDROME, CHRONIC PAIN 03/06/2007  . Cancer     prostate ca  . Prostate cancer   . HYPERTENSION 03/06/2007    [name]  . Pneumonia     hx of  . Dysrhythmia     sees [name] for "palpitations"  . DVT (deep venous thrombosis)   . Anxiety   . Peripheral vascular disease 03    dvt  . SLEEP APNEA 03/06/2007    uses vpap, last sleep study 2011, Dr. Alanson Puls, at [place] of breath     infreq  . History of kidney stones   . Anginal pain     s/p cardiac cath 07/19/13 showing NL coronaries, EF 50-55%   Past Surgical History  Procedure Laterality Date  . Decompression and fusion      cervicothoracic  . Partial nephrectomy Left      bx  . Cardiac catheterization    . Lumbar fusion    . Prostate surgery      robotic prostatectomy  . Penile prosthesis implant    . Tonsillectomy    . Leg skin lesion  biopsy / excision      left leg  . Eye surgery Bilateral     blepharoplasty  . Spinal cord stimulator insertion N/A 04/11/2014    Procedure: LUMBAR SPINAL CORD STIMULATOR INSERTION;  Surgeon: Newman Pies, MD;  Location: [facility];  Service: Neurosurgery;  Laterality: N/A;   Family History  Problem Relation Age of Onset  . Heart disease Neg Hx     family hx CHF  . Kidney disease Neg Hx     family hx   . Cancer Neg Hx     family hx prostate   History  Substance Use Topics  . Smoking status: Former Smoker -- 0.50 packs/day for 3 years    Types: Cigarettes    Quit date: 09/07/1983  . Smokeless tobacco: Never Used  . Alcohol Use: No    Review of Systems  Constitutional: Negative for activity change and appetite change.  Respiratory: Negative for cough and shortness of breath.   Cardiovascular: Positive for chest pain.  Gastrointestinal: Positive for abdominal pain.  Genitourinary: Negative for dysuria.      Allergies  Adhesive  and Pollen extract  Home Medications   Prior to Admission medications   Medication Sig Start Date End Date Taking? Authorizing Provider  albuterol (PROVENTIL HFA;VENTOLIN HFA) 108 (90 BASE) MCG/ACT inhaler Inhale 2 puffs into the lungs 2 (two) times daily as needed for wheezing or shortness of breath.    Yes Historical Provider, MD  amitriptyline (ELAVIL) 25 MG tablet Take 1 tablet (25 mg total) by mouth at bedtime. 09/12/12  Yes Marletta Lor, MD  atenolol (TENORMIN) 25 MG tablet Take 25 mg by mouth 2 (two) times daily.   Yes Historical Provider, MD  atorvastatin (LIPITOR) 40 MG tablet Take 40 mg by mouth daily.   Yes Historical Provider, MD  azaTHIOprine (IMURAN) 50 MG tablet Take 1 tablet (50 mg total) by mouth 2 (two) times daily. 01/17/14  Yes Marletta Lor,  MD  buPROPion (WELLBUTRIN XL) 300 MG 24 hr tablet Take 300 mg by mouth daily.   Yes Historical Provider, MD  cyanocobalamin (,VITAMIN B-12,) 1000 MCG/ML injection Inject 1,000 mcg into the muscle every 30 (thirty) days. On or about the 1st of August   Yes Historical Provider, MD  cyclobenzaprine (FLEXERIL) 10 MG tablet Take 10 mg by mouth 3 (three) times daily as needed for muscle spasms.  06/06/13  Yes Historical Provider, MD  cycloSPORINE (RESTASIS) 0.05 % ophthalmic emulsion Place 2 drops into both eyes 2 (two) times daily.    Yes Historical Provider, MD  dicyclomine (BENTYL) 10 MG capsule Take 10 mg by mouth 4 (four) times daily.   Yes Historical Provider, MD  docusate sodium (COLACE) 100 MG capsule Take 100 mg by mouth at bedtime.    Yes Historical Provider, MD  DULoxetine (CYMBALTA) 30 MG capsule Take 30 mg by mouth daily. Take with a 30 mg capsule for a 90 mg dose   Yes Historical Provider, MD  DULoxetine (CYMBALTA) 60 MG capsule Take 60 mg by mouth daily. Take with a 30 mg capsule for a 90 mg dose   Yes Historical Provider, MD  erythromycin ophthalmic ointment Place 1 application into both eyes at bedtime.  04/15/14  Yes Historical Provider, MD  esomeprazole (NEXIUM) 40 MG capsule Take 40 mg by mouth daily at 12 noon.   Yes Historical Provider, MD  gabapentin (NEURONTIN) 300 MG capsule Take 900 mg by mouth 4 (four) times daily.    Yes Historical Provider, MD  guaiFENesin (MUCINEX) 600 MG 12 hr tablet Take 600 mg by mouth 2 (two) times daily.    Yes Historical Provider, MD  oxybutynin (DITROPAN) 5 MG tablet Take 5 mg by mouth at bedtime.    Yes Historical Provider, MD  oxyCODONE-acetaminophen (PERCOCET) 10-325 MG per tablet Take 1 tablet by mouth every 4 (four) hours as needed for pain. 04/12/14  Yes Newman Pies, MD  Polyethyl Glycol-Propyl Glycol (SYSTANE PRESERVATIVE FREE OP) Place 1 drop into both eyes 2 (two) times daily.   Yes Historical Provider, MD  predniSONE (DELTASONE) 5 MG tablet  Take 5 mg by mouth daily. Continuous course   Yes Historical Provider, MD  promethazine (PHENERGAN) 25 MG tablet Take 25 mg by mouth every 6 (six) hours as needed for nausea.    Yes Historical Provider, MD  traMADol (ULTRAM) 50 MG tablet Take 50 mg by mouth daily as needed for moderate pain.    Yes Historical Provider, MD  triamcinolone (NASACORT AQ) 55 MCG/ACT AERO nasal inhaler Place 1 spray into both nostrils daily as needed (dry mouth).    Yes Historical  Provider, MD  triamcinolone ointment (KENALOG) 0.1 % Apply 1 application topically 2 (two) times daily.   Yes Historical Provider, MD  ibuprofen (ADVIL,MOTRIN) 600 MG tablet Take 1 tablet (600 mg total) by mouth every 6 (six) hours as needed. 04/19/14   Varney Biles, MD  nystatin (MYCOSTATIN) 100000 UNIT/ML suspension Take 5 mLs (500,000 Units total) by mouth 4 (four) times daily. 04/19/14   Leafy Motsinger Kathrynn Humble, MD   BP 122/77  Pulse 74  Temp(Src) 98.4 F (36.9 C) (Oral)  Resp 17  Ht 5\' 9"  (1.753 m)  Wt 230 lb (104.327 kg)  BMI 33.95 kg/m2  SpO2 97% Physical Exam  Nursing note and vitals reviewed. Constitutional: He is oriented to person, place, and time. He appears well-developed.  HENT:  Head: Normocephalic and atraumatic.  Pt has oral thrush  Eyes: Conjunctivae and EOM are normal. Pupils are equal, round, and reactive to light.  Neck: Normal range of motion. Neck supple.  Cardiovascular: Normal rate and regular rhythm.   Pulmonary/Chest: Effort normal and breath sounds normal.  Abdominal: Soft. Bowel sounds are normal. He exhibits no distension. There is no tenderness. There is no rebound and no guarding.  Musculoskeletal:  Tenderness to palpation over the incisional site, in the thoracic spine. 2+ and equal paellar reflex. Sensory exam normal for bilateral upper and lower extremities - and patient is able to discriminate between sharp and dull. Motor exam is 4+/5, bilateral lower extremities.   Neurological: He is alert and  oriented to person, place, and time.  Skin: Skin is warm.    ED Course  Procedures (including critical care time) Labs Review Labs Reviewed  RAPID STREP SCREEN  CULTURE, GROUP A STREP  CBC WITH DIFFERENTIAL  BASIC METABOLIC PANEL    Imaging Review Ct Chest W Contrast  04/19/2014   CLINICAL DATA:  Worsening back pain, chills and nausea, 1 week status post placement of thoracic spinal stimulator at the right flank. Sore throat and tongue exudate.  EXAM: CT CHEST, ABDOMEN, AND PELVIS WITH CONTRAST  TECHNIQUE: Multidetector CT imaging of the chest, abdomen and pelvis was performed following the standard protocol during bolus administration of intravenous contrast.  CONTRAST:  180mL OMNIPAQUE IOHEXOL 300 MG/ML  SOLN  COMPARISON:  Chest radiograph performed 04/10/2014, and CTA of the chest performed 09/09/2012  FINDINGS: CT CHEST FINDINGS  Minimal bilateral dependent subsegmental atelectasis is noted. Small nodules within the right middle lobe and along the right minor fissure are stable in appearance and likely benign. No pleural effusion or pneumothorax is seen. No suspicious masses are identified.  The mediastinum is unremarkable in appearance. No mediastinal lymphadenopathy is seen. Right hilar and peribronchial nodes are stable in appearance. No pericardial effusion is identified. The great vessels are grossly unremarkable in appearance.  The visualized portions of the thyroid gland are unremarkable. No axillary lymphadenopathy is seen.  The patient's spinal stimulation leads are grossly unremarkable in appearance. No focal soft tissue abnormalities are seen, though evaluation of the soft tissues is somewhat limited on CT. There is no evidence of abscess.  No acute osseous abnormalities are identified.  CT ABDOMEN AND PELVIS FINDINGS  The liver and spleen are unremarkable in appearance. The gallbladder is within normal limits. The pancreas and adrenal glands are unremarkable.  The kidneys are  unremarkable in appearance. There is no evidence of hydronephrosis. No renal or ureteral stones are seen. No perinephric stranding is appreciated.  No free fluid is identified. The small bowel is unremarkable in appearance. The stomach is  within normal limits. No acute vascular abnormalities are seen.  The appendix is normal in caliber and air without evidence for appendicitis. The colon is partially filled with stool and is unremarkable in appearance.  No focal abnormality is noted at the right flank, at site of the patient's spinal stimulation device. Minimal surrounding postoperative stranding is noted. There is no evidence of abscess.  The bladder is mildly distended and grossly unremarkable. The prostate is not well seen. A penile implant reservoir is noted at the left hemipelvis; it is unremarkable in appearance. No inguinal lymphadenopathy is seen.  No acute osseous abnormalities are identified. The patient is status post posterior lumbar spinal fusion at L3-S1, with underlying decompression.  IMPRESSION: 1. No acute abnormality seen with regard to the patient's spinal stimulation device at the right flank; the spinal stimulation leads are grossly unremarkable in appearance, and seen in expected position. No evidence of abscess. Mild postoperative soft tissue stranding noted at the right flank. 2. Minimal bilateral dependent subsegmental atelectasis noted; stable small nodules within the right lung are likely benign. Lungs otherwise clear. 3. No acute abnormality seen within the abdomen and pelvis.   Electronically Signed   By: Garald Balding M.D.   On: 04/19/2014 07:03   Ct Abdomen Pelvis W Contrast  04/19/2014   CLINICAL DATA:  Worsening back pain, chills and nausea, 1 week status post placement of thoracic spinal stimulator at the right flank. Sore throat and tongue exudate.  EXAM: CT CHEST, ABDOMEN, AND PELVIS WITH CONTRAST  TECHNIQUE: Multidetector CT imaging of the chest, abdomen and pelvis was  performed following the standard protocol during bolus administration of intravenous contrast.  CONTRAST:  159mL OMNIPAQUE IOHEXOL 300 MG/ML  SOLN  COMPARISON:  Chest radiograph performed 04/10/2014, and CTA of the chest performed 09/09/2012  FINDINGS: CT CHEST FINDINGS  Minimal bilateral dependent subsegmental atelectasis is noted. Small nodules within the right middle lobe and along the right minor fissure are stable in appearance and likely benign. No pleural effusion or pneumothorax is seen. No suspicious masses are identified.  The mediastinum is unremarkable in appearance. No mediastinal lymphadenopathy is seen. Right hilar and peribronchial nodes are stable in appearance. No pericardial effusion is identified. The great vessels are grossly unremarkable in appearance.  The visualized portions of the thyroid gland are unremarkable. No axillary lymphadenopathy is seen.  The patient's spinal stimulation leads are grossly unremarkable in appearance. No focal soft tissue abnormalities are seen, though evaluation of the soft tissues is somewhat limited on CT. There is no evidence of abscess.  No acute osseous abnormalities are identified.  CT ABDOMEN AND PELVIS FINDINGS  The liver and spleen are unremarkable in appearance. The gallbladder is within normal limits. The pancreas and adrenal glands are unremarkable.  The kidneys are unremarkable in appearance. There is no evidence of hydronephrosis. No renal or ureteral stones are seen. No perinephric stranding is appreciated.  No free fluid is identified. The small bowel is unremarkable in appearance. The stomach is within normal limits. No acute vascular abnormalities are seen.  The appendix is normal in caliber and air without evidence for appendicitis. The colon is partially filled with stool and is unremarkable in appearance.  No focal abnormality is noted at the right flank, at site of the patient's spinal stimulation device. Minimal surrounding postoperative  stranding is noted. There is no evidence of abscess.  The bladder is mildly distended and grossly unremarkable. The prostate is not well seen. A penile implant reservoir is noted at  the left hemipelvis; it is unremarkable in appearance. No inguinal lymphadenopathy is seen.  No acute osseous abnormalities are identified. The patient is status post posterior lumbar spinal fusion at L3-S1, with underlying decompression.  IMPRESSION: 1. No acute abnormality seen with regard to the patient's spinal stimulation device at the right flank; the spinal stimulation leads are grossly unremarkable in appearance, and seen in expected position. No evidence of abscess. Mild postoperative soft tissue stranding noted at the right flank. 2. Minimal bilateral dependent subsegmental atelectasis noted; stable small nodules within the right lung are likely benign. Lungs otherwise clear. 3. No acute abnormality seen within the abdomen and pelvis.   Electronically Signed   By: Garald Balding M.D.   On: 04/19/2014 07:03     EKG Interpretation None      MDM   Final diagnoses:  Post-op pain  Oral candida    Pt comes in with cc of back pain, radiating to the chest. Also has sore throat.  Sore throat - definitely esophageal candidiasis per hx and exam. Will give nystatin.  Pt has normal labs, normal neuro exam. i spoke with the neurosurgery on call, to see if there is any complications with such procedure. They recommend CT chest and abd - all of which ordered, and are negative. Will discharge.   Varney Biles, MD 04/19/14 [phone]

## 2014-04-19 NOTE — Discharge Instructions (Signed)
We saw you in the ER for the back pain and sore throat. All the results in the ER are normal, labs and imaging. We are not sure what is causing your symptoms. The workup in the ER is not complete, and is limited to screening for life threatening and emergent conditions only, so please see a primary care doctor for further evaluation.   Thrush, Adult  Ritta Slot, also called oral candidiasis, is a fungal infection that develops in the mouth and throat and on the tongue. It causes white patches to form on the mouth and tongue. Ritta Slot is most common in older adults, but it can occur at any age.  Many cases of thrush are mild, but this infection can also be more serious. Ritta Slot can be a recurring problem for people who have chronic illnesses or who take medicines that limit the body's ability to fight infection. Because these people have difficulty fighting infections, the fungus that causes thrush can spread throughout the body. This can cause life-threatening blood or organ infections. CAUSES  Ritta Slot is usually caused by a yeast called Candida albicans. This fungus is normally present in small amounts in the mouth and on other mucous membranes. It usually causes no harm. However, when conditions are present that allow the fungus to grow uncontrolled, it invades surrounding tissues and becomes an infection. Less often, other Candida species can also lead to thrush.  RISK FACTORS Ritta Slot is more likely to develop in the following people:  People with an impaired ability to fight infection (weakened immune system).   Older adults.   People with HIV.   People with diabetes.   People with dry mouth (xerostomia).   Pregnant women.   People with poor dental care, especially those who have false teeth.   People who use antibiotic medicines.  SIGNS AND SYMPTOMS  Ritta Slot can be a mild infection that causes no symptoms. If symptoms develop, they may include:   A burning feeling in the mouth and  throat. This can occur at the start of a thrush infection.   White patches that adhere to the mouth and tongue. The tissue around the patches may be red, raw, and painful. If rubbed (during tooth brushing, for example), the patches and the tissue of the mouth may bleed easily.   A bad taste in the mouth or difficulty tasting foods.   Cottony feeling in the mouth.   Pain during eating and swallowing. DIAGNOSIS  Your health care provider can usually diagnose thrush by looking in your mouth and asking you questions about your health.  TREATMENT  Medicines that help prevent the growth of fungi (antifungals) are the standard treatment for thrush. These medicines are either applied directly to the affected area (topical) or swallowed (oral). The treatment will depend on the severity of the condition.  Mild Thrush Mild cases of thrush may clear up with the use of an antifungal mouth rinse or lozenges. Treatment usually lasts about 14 days.  Moderate to Severe Thrush  More severe thrush infections that have spread to the esophagus are treated with an oral antifungal medicine. A topical antifungal medicine may also be used.   For some severe infections, a treatment period longer than 14 days may be needed.   Oral antifungal medicines are almost never used during pregnancy because the fetus may be harmed. However, if a pregnant woman has a rare, severe thrush infection that has spread to her blood, oral antifungal medicines may be used. In this case, the risk  of harm to the mother and fetus from the severe thrush infection may be greater than the risk posed by the use of antifungal medicines.  Persistent or Recurrent Thrush For cases of thrush that do not go away or keep coming back, treatment may involve the following:   Treatment may be needed twice as long as the symptoms last.   Treatment will include both oral and topical antifungal medicines.   People with weakened immune systems  can take an antifungal medicine on a continuous basis to prevent thrush infections.  It is important to treat conditions that make you more likely to get thrush, such as diabetes or HIV.  HOME CARE INSTRUCTIONS   Only take over-the-counter or prescription medicine as directed by your health care provider. Talk to your health care provider about an over-the-counter medicine called gentian violet, which kills bacteria and fungi.   Eat plain, unflavored yogurt as directed by your health care provider. Check the label to make sure the yogurt contains live cultures. This yogurt can help healthy bacteria grow in the mouth that can stop the growth of the fungus that causes thrush.   Try these measures to help reduce the discomfort of thrush:   Drink cold liquids such as water or iced tea.   Try flavored ice treats or frozen juices.   Eat foods that are easy to swallow, such as gelatin, ice cream, or custard.   If the patches in your mouth are painful, try drinking from a straw.   Rinse your mouth several times a day with a warm saltwater rinse. You can make the saltwater mixture with 1 tsp (6 g) of salt in 8 fl oz (0.2 L) of warm water.   If you wear dentures, remove the dentures before going to bed, brush them vigorously, and soak them in a cleaning solution as directed by your health care provider.   Women who are breastfeeding should clean their nipples with an antifungal medicine as directed by their health care provider. Dry the nipples after breastfeeding. Applying lanolin-containing body lotion may help relieve nipple soreness.  SEEK MEDICAL CARE IF:  Your symptoms are getting worse or are not improving within 7 days of starting treatment.   You have symptoms of spreading infection, such as white patches on the skin outside of the mouth.   You are nursing and you have redness, burning, or pain in the nipples that is not relieved with treatment.  MAKE SURE  YOU:  Understand these instructions.  Will watch your condition.  Will get help right away if you are not doing well or get worse. Document Released: 05/18/2004 Document Revised: 06/13/2013 Document Reviewed: 03/26/2013 [facility] Patient Information 2015 [place], [place]. This information is not intended to replace advice given to you by your health care provider. Make sure you discuss any questions you have with your health care provider.

## 2014-04-20 LAB — CULTURE, GROUP A STREP

## 2014-05-07 DIAGNOSIS — M5412 Radiculopathy, cervical region: Secondary | ICD-10-CM | POA: Diagnosis not present

## 2014-05-16 DIAGNOSIS — H2510 Age-related nuclear cataract, unspecified eye: Secondary | ICD-10-CM | POA: Diagnosis not present

## 2014-05-16 DIAGNOSIS — R0602 Shortness of breath: Secondary | ICD-10-CM | POA: Diagnosis not present

## 2014-05-16 DIAGNOSIS — H0289 Other specified disorders of eyelid: Secondary | ICD-10-CM | POA: Diagnosis not present

## 2014-05-16 DIAGNOSIS — D869 Sarcoidosis, unspecified: Secondary | ICD-10-CM | POA: Diagnosis not present

## 2014-05-16 DIAGNOSIS — H526 Other disorders of refraction: Secondary | ICD-10-CM | POA: Diagnosis not present

## 2014-05-16 DIAGNOSIS — H04129 Dry eye syndrome of unspecified lacrimal gland: Secondary | ICD-10-CM | POA: Diagnosis not present

## 2014-05-16 DIAGNOSIS — H53009 Unspecified amblyopia, unspecified eye: Secondary | ICD-10-CM | POA: Diagnosis not present

## 2014-05-24 DIAGNOSIS — D869 Sarcoidosis, unspecified: Secondary | ICD-10-CM | POA: Diagnosis not present

## 2014-06-04 DIAGNOSIS — M4802 Spinal stenosis, cervical region: Secondary | ICD-10-CM | POA: Diagnosis not present

## 2014-06-04 DIAGNOSIS — R209 Unspecified disturbances of skin sensation: Secondary | ICD-10-CM | POA: Diagnosis not present

## 2014-06-04 DIAGNOSIS — M5412 Radiculopathy, cervical region: Secondary | ICD-10-CM | POA: Diagnosis not present

## 2014-06-13 DIAGNOSIS — D869 Sarcoidosis, unspecified: Secondary | ICD-10-CM | POA: Diagnosis not present

## 2014-06-13 DIAGNOSIS — I10 Essential (primary) hypertension: Secondary | ICD-10-CM | POA: Diagnosis not present

## 2014-06-13 DIAGNOSIS — H04123 Dry eye syndrome of bilateral lacrimal glands: Secondary | ICD-10-CM | POA: Diagnosis not present

## 2014-06-13 DIAGNOSIS — H53002 Unspecified amblyopia, left eye: Secondary | ICD-10-CM | POA: Diagnosis not present

## 2014-06-13 DIAGNOSIS — Z8669 Personal history of other diseases of the nervous system and sense organs: Secondary | ICD-10-CM | POA: Insufficient documentation

## 2014-06-13 DIAGNOSIS — Z87891 Personal history of nicotine dependence: Secondary | ICD-10-CM | POA: Diagnosis not present

## 2014-06-13 DIAGNOSIS — H02539 Eyelid retraction unspecified eye, unspecified lid: Secondary | ICD-10-CM | POA: Insufficient documentation

## 2014-06-13 DIAGNOSIS — H2513 Age-related nuclear cataract, bilateral: Secondary | ICD-10-CM | POA: Diagnosis not present

## 2014-06-13 DIAGNOSIS — H527 Unspecified disorder of refraction: Secondary | ICD-10-CM | POA: Diagnosis not present

## 2014-06-19 ENCOUNTER — Ambulatory Visit (INDEPENDENT_AMBULATORY_CARE_PROVIDER_SITE_OTHER): Payer: Medicare Other

## 2014-06-19 DIAGNOSIS — E538 Deficiency of other specified B group vitamins: Secondary | ICD-10-CM | POA: Diagnosis not present

## 2014-06-19 DIAGNOSIS — Z23 Encounter for immunization: Secondary | ICD-10-CM | POA: Diagnosis not present

## 2014-06-19 DIAGNOSIS — R4701 Aphasia: Secondary | ICD-10-CM | POA: Diagnosis not present

## 2014-06-19 MED ORDER — CYANOCOBALAMIN 1000 MCG/ML IJ SOLN
1000.0000 ug | Freq: Once | INTRAMUSCULAR | Status: AC
  Administered 2014-06-19: 1000 ug via INTRAMUSCULAR

## 2014-06-20 ENCOUNTER — Telehealth: Payer: Self-pay | Admitting: Internal Medicine

## 2014-06-20 NOTE — Telephone Encounter (Signed)
Pt has been sch for 06/24/14

## 2014-06-20 NOTE — Telephone Encounter (Signed)
Pt last cpx was in 2012. Pt needs employment cpx ASAP for [employer] . Can I create 30 min slot?

## 2014-06-20 NOTE — Telephone Encounter (Signed)
Yes

## 2014-06-24 ENCOUNTER — Encounter: Payer: Self-pay | Admitting: Internal Medicine

## 2014-06-24 ENCOUNTER — Ambulatory Visit (INDEPENDENT_AMBULATORY_CARE_PROVIDER_SITE_OTHER): Payer: Medicare Other | Admitting: Internal Medicine

## 2014-06-24 VITALS — BP 110/70 | HR 89 | Temp 98.1°F | Resp 20 | Ht 69.5 in | Wt 232.0 lb

## 2014-06-24 DIAGNOSIS — R7302 Impaired glucose tolerance (oral): Secondary | ICD-10-CM | POA: Diagnosis not present

## 2014-06-24 DIAGNOSIS — Z0000 Encounter for general adult medical examination without abnormal findings: Secondary | ICD-10-CM | POA: Diagnosis not present

## 2014-06-24 DIAGNOSIS — E7800 Pure hypercholesterolemia, unspecified: Secondary | ICD-10-CM

## 2014-06-24 DIAGNOSIS — G4730 Sleep apnea, unspecified: Secondary | ICD-10-CM

## 2014-06-24 DIAGNOSIS — G894 Chronic pain syndrome: Secondary | ICD-10-CM | POA: Diagnosis not present

## 2014-06-24 DIAGNOSIS — I10 Essential (primary) hypertension: Secondary | ICD-10-CM | POA: Diagnosis not present

## 2014-06-24 LAB — LIPID PANEL
Cholesterol: 140 mg/dL (ref 0–200)
HDL: 39.6 mg/dL (ref >39.00)
LDL Cholesterol: 73 mg/dL (ref 0–99)
NonHDL: 100.4
Total CHOL/HDL Ratio: 4
Triglycerides: 135 mg/dL (ref 0.0–149.0)
VLDL: 27 mg/dL (ref 0.0–40.0)

## 2014-06-24 LAB — TSH: TSH: 0.97 u[IU]/mL (ref 0.35–4.50)

## 2014-06-24 LAB — HEMOGLOBIN A1C: Hgb A1c MFr Bld: 5.2 % (ref 4.6–6.5)

## 2014-06-24 NOTE — Progress Notes (Signed)
Pre visit review using our clinic review tool, if applicable. No additional management support is needed unless otherwise documented below in the visit note. 

## 2014-06-24 NOTE — Patient Instructions (Addendum)
It is important that you exercise regularly, at least 20 minutes 3 to 4 times per week.  If you develop chest pain or shortness of breath seek  medical attention.  Limit your sodium (Salt) intake  You need to lose weight.  Consider a lower calorie diet and regular exercise.  Please check your blood pressure on a regular basis.  If it is consistently greater than 150/90, please make an office appointment.  Return in 6 months for follow-up Health Maintenance A healthy lifestyle and preventative care can promote health and wellness.  Maintain regular health, dental, and eye exams.  Eat a healthy diet. Foods like vegetables, fruits, whole grains, low-fat dairy products, and lean protein foods contain the nutrients you need and are low in calories. Decrease your intake of foods high in solid fats, added sugars, and salt. Get information about a proper diet from your health care provider, if necessary.  Regular physical exercise is one of the most important things you can do for your health. Most adults should get at least 150 minutes of moderate-intensity exercise (any activity that increases your heart rate and causes you to sweat) each week. In addition, most adults need muscle-strengthening exercises on 2 or more days a week.   Maintain a healthy weight. The body mass index (BMI) is a screening tool to identify possible weight problems. It provides an estimate of body fat based on height and weight. Your health care provider can find your BMI and can help you achieve or maintain a healthy weight. For males 20 years and older:  A BMI below 18.5 is considered underweight.  A BMI of 18.5 to 24.9 is normal.  A BMI of 25 to 29.9 is considered overweight.  A BMI of 30 and above is considered obese.  Maintain normal blood lipids and cholesterol by exercising and minimizing your intake of saturated fat. Eat a balanced diet with plenty of fruits and vegetables. Blood tests for lipids and  cholesterol should begin at age 45 and be repeated every 5 years. If your lipid or cholesterol levels are high, you are over age 52, or you are at high risk for heart disease, you may need your cholesterol levels checked more frequently.Ongoing high lipid and cholesterol levels should be treated with medicines if diet and exercise are not working.  If you smoke, find out from your health care provider how to quit. If you do not use tobacco, do not start.  Lung cancer screening is recommended for adults aged 67-80 years who are at high risk for developing lung cancer because of a history of smoking. A yearly low-dose CT scan of the lungs is recommended for people who have at least a 30-pack-year history of smoking and are current smokers or have quit within the past 15 years. A pack year of smoking is smoking an average of 1 pack of cigarettes a day for 1 year (for example, a 30-pack-year history of smoking could mean smoking 1 pack a day for 30 years or 2 packs a day for 15 years). Yearly screening should continue until the smoker has stopped smoking for at least 15 years. Yearly screening should be stopped for people who develop a health problem that would prevent them from having lung cancer treatment.  If you choose to drink alcohol, do not have more than 2 drinks per day. One drink is considered to be 12 oz (360 mL) of beer, 5 oz (150 mL) of wine, or 1.5 oz (45 mL) of  liquor.  Avoid the use of street drugs. Do not share needles with anyone. Ask for help if you need support or instructions about stopping the use of drugs.  High blood pressure causes heart disease and increases the risk of stroke. Blood pressure should be checked at least every 1-2 years. Ongoing high blood pressure should be treated with medicines if weight loss and exercise are not effective.  If you are 57-28 years old, ask your health care provider if you should take aspirin to prevent heart disease.  Diabetes screening involves  taking a blood sample to check your fasting blood sugar level. This should be done once every 3 years after age 57 if you are at a normal weight and without risk factors for diabetes. Testing should be considered at a younger age or be carried out more frequently if you are overweight and have at least 1 risk factor for diabetes.  Colorectal cancer can be detected and often prevented. Most routine colorectal cancer screening begins at the age of 53 and continues through age 64. However, your health care provider may recommend screening at an earlier age if you have risk factors for colon cancer. On a yearly basis, your health care provider may provide home test kits to check for hidden blood in the stool. A small camera at the end of a tube may be used to directly examine the colon (sigmoidoscopy or colonoscopy) to detect the earliest forms of colorectal cancer. Talk to your health care provider about this at age 79 when routine screening begins. A direct exam of the colon should be repeated every 5-10 years through age 69, unless early forms of precancerous polyps or small growths are found.  People who are at an increased risk for hepatitis B should be screened for this virus. You are considered at high risk for hepatitis B if:  You were born in a country where hepatitis B occurs often. Talk with your health care provider about which countries are considered high risk.  Your parents were born in a high-risk country and you have not received a shot to protect against hepatitis B (hepatitis B vaccine).  You have HIV or AIDS.  You use needles to inject street drugs.  You live with, or have sex with, someone who has hepatitis B.  You are a man who has sex with other men (MSM).  You get hemodialysis treatment.  You take certain medicines for conditions like cancer, organ transplantation, and autoimmune conditions.  Hepatitis C blood testing is recommended for all people born from [birth year] through 1966  and any individual with known risk factors for hepatitis C.  Healthy men should no longer receive prostate-specific antigen (PSA) blood tests as part of routine cancer screening. Talk to your health care provider about prostate cancer screening.  Testicular cancer screening is not recommended for adolescents or adult males who have no symptoms. Screening includes self-exam, a health care provider exam, and other screening tests. Consult with your health care provider about any symptoms you have or any concerns you have about testicular cancer.  Practice safe sex. Use condoms and avoid high-risk sexual practices to reduce the spread of sexually transmitted infections (STIs).  You should be screened for STIs, including gonorrhea and chlamydia if:  You are sexually active and are younger than 24 years.  You are older than 24 years, and your health care provider tells you that you are at risk for this type of infection.  Your sexual activity has changed  since you were last screened, and you are at an increased risk for chlamydia or gonorrhea. Ask your health care provider if you are at risk.  If you are at risk of being infected with HIV, it is recommended that you take a prescription medicine daily to prevent HIV infection. This is called pre-exposure prophylaxis (PrEP). You are considered at risk if:  You are a man who has sex with other men (MSM).  You are a heterosexual man who is sexually active with multiple partners.  You take drugs by injection.  You are sexually active with a partner who has HIV.  Talk with your health care provider about whether you are at high risk of being infected with HIV. If you choose to begin PrEP, you should first be tested for HIV. You should then be tested every 3 months for as long as you are taking PrEP.  Use sunscreen. Apply sunscreen liberally and repeatedly throughout the day. You should seek shade when your shadow is shorter than you. Protect  yourself by wearing long sleeves, pants, a wide-brimmed hat, and sunglasses year round whenever you are outdoors.  Tell your health care provider of new moles or changes in moles, especially if there is a change in shape or color. Also, tell your health care provider if a mole is larger than the size of a pencil eraser.  A one-time screening for abdominal aortic aneurysm (AAA) and surgical repair of large AAAs by ultrasound is recommended for men aged 40-75 years who are current or former smokers.  Stay current with your vaccines (immunizations). Document Released: 02/19/2008 Document Revised: 08/28/2013 Document Reviewed: 01/18/2011 [facility] Patient Information 2015 [place], [place]. This information is not intended to replace advice given to you by your health care provider. Make sure you discuss any questions you have with your health care provider.

## 2014-06-24 NOTE — Progress Notes (Signed)
Subjective:    Patient ID: John Carol., male    DOB: 21-Apr-1966, 48 y.o.   MRN: [id number]  HPI 48 year old patient who is seen today for a preventive health examination. He is followed annually by urology.  He is status post radical prostatectomy at age 50.  He had a penile implant approximately 2 years later He has chronic pain and approximately 2 months ago had a spinal stimulator placed. He has a history of hypertrophic cardiomyopathy.  B12 deficiency, hypertension, impaired glucose tolerance dyslipidemia, and osteoarthritis.  He has a history of sarcoidosis and gastroesophageal reflux disease  Past Medical History  Diagnosis Date  . B12 DEFICIENCY 03/06/2007  . CARDIOMYOPATHY, IDIOPATHIC HYPERTROPHIC 03/06/2007  . DEPRESSION 03/06/2007  . GERD 06/01/2007  . HYPERCHOLESTEROLEMIA 03/06/2007  . HYPERLIPIDEMIA 11/08/2008  . IMPAIRED GLUCOSE TOLERANCE 11/08/2008  . NEPHROLITHIASIS, HX OF 04/07/2010  . 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 OSTEOARTHRITIS 06/01/2007  . Palpitations 10/23/2007  . SYNDROME, CHRONIC PAIN 03/06/2007  . Cancer     prostate ca  . Prostate cancer   . HYPERTENSION 03/06/2007    [name]  . Pneumonia     hx of  . Dysrhythmia     sees [name] for "palpitations"  . DVT (deep venous thrombosis)   . Anxiety   . Peripheral vascular disease 03    dvt  . SLEEP APNEA 03/06/2007    uses vpap, last sleep study 2011, Dr. Alanson Puls, at [place] of breath     infreq  . History of kidney stones   . Anginal pain     s/p cardiac cath 07/19/13 showing NL coronaries, EF 50-55%    History   Social History  . Marital Status: Married    Spouse Name: N/A    Number of Children: N/A  . 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 Smoking status: Former Smoker -- 0.50 packs/day for 3 years    Types: Cigarettes    Quit date: 09/07/1983  . Smokeless tobacco: Never Used  . Alcohol Use: No  . Drug  Use: No  . Sexual Activity: Yes   Other Topics Concern  . Not on file   Social History Narrative  . No narrative on file    Past Surgical History  Procedure Laterality Date  . Decompression and fusion      cervicothoracic  . Partial nephrectomy Left     bx  . Cardiac catheterization    . Lumbar fusion    . Prostate surgery      robotic prostatectomy  . Penile prosthesis implant    . Tonsillectomy    . Leg skin lesion  biopsy / excision      left leg  . Eye surgery Bilateral     blepharoplasty  . Spinal cord stimulator insertion N/A 04/11/2014    Procedure: LUMBAR SPINAL CORD STIMULATOR INSERTION;  Surgeon: Newman Pies, MD;  Location: [facility];  Service: Neurosurgery;  Laterality: N/A;    Family History  Problem Relation Age of Onset  . Heart disease Neg Hx     family hx CHF  . Kidney disease Neg Hx     family hx   . Cancer Neg Hx     family hx prostate    Allergies  Allergen Reactions  . Adhesive [Tape] Other (See Comments)    Skin irritation  . Pollen Extract Other (See Comments)  Stuffy nose    Current Outpatient Prescriptions on File Prior to Visit  Medication Sig Dispense Refill  . albuterol (PROVENTIL HFA;VENTOLIN HFA) 108 (90 BASE) MCG/ACT inhaler Inhale 2 puffs into the lungs 2 (two) times daily as needed for wheezing or shortness of breath.       Marland Kitchen amitriptyline (ELAVIL) 25 MG tablet Take 1 tablet (25 mg total) by mouth at bedtime.  30 tablet  2  . atenolol (TENORMIN) 25 MG tablet Take 25 mg by mouth 2 (two) times daily.      Marland Kitchen atorvastatin (LIPITOR) 40 MG tablet Take 40 mg by mouth daily.      Marland Kitchen azaTHIOprine (IMURAN) 50 MG tablet Take 1 tablet (50 mg total) by mouth 2 (two) times daily.  180 tablet  3  . buPROPion (WELLBUTRIN XL) 300 MG 24 hr tablet Take 300 mg by mouth daily.      . cyanocobalamin (,VITAMIN B-12,) 1000 MCG/ML injection Inject 1,000 mcg into the muscle every 30 (thirty) days. On or about the 1st of August      .  cyclobenzaprine (FLEXERIL) 10 MG tablet Take 10 mg by mouth 3 (three) times daily as needed for muscle spasms.       . cycloSPORINE (RESTASIS) 0.05 % ophthalmic emulsion Place 2 drops into both eyes 2 (two) times daily.       Marland Kitchen dicyclomine (BENTYL) 10 MG capsule Take 10 mg by mouth 4 (four) times daily.      Marland Kitchen docusate sodium (COLACE) 100 MG capsule Take 100 mg by mouth at bedtime.       . DULoxetine (CYMBALTA) 30 MG capsule Take 30 mg by mouth daily. Take with a 30 mg capsule for a 90 mg dose      . DULoxetine (CYMBALTA) 60 MG capsule Take 60 mg by mouth daily. Take with a 30 mg capsule for a 90 mg dose      . erythromycin ophthalmic ointment Place 1 application into both eyes at bedtime.       Marland Kitchen esomeprazole (NEXIUM) 40 MG capsule Take 40 mg by mouth daily at 12 noon.      . gabapentin (NEURONTIN) 300 MG capsule Take 900 mg by mouth 4 (four) times daily.       Marland Kitchen guaiFENesin (MUCINEX) 600 MG 12 hr tablet Take 600 mg by mouth 2 (two) times daily.       Marland Kitchen ibuprofen (ADVIL,MOTRIN) 600 MG tablet Take 1 tablet (600 mg total) by mouth every 6 (six) hours as needed.  30 tablet  0  . oxybutynin (DITROPAN) 5 MG tablet Take 5 mg by mouth at bedtime.       Vladimir Faster Glycol-Propyl Glycol (SYSTANE PRESERVATIVE FREE OP) Place 1 drop into both eyes 2 (two) times daily.      . predniSONE (DELTASONE) 5 MG tablet Take 5 mg by mouth daily. Continuous course      . promethazine (PHENERGAN) 25 MG tablet Take 25 mg by mouth every 6 (six) hours as needed for nausea.       . traMADol (ULTRAM) 50 MG tablet Take 50 mg by mouth daily as needed for moderate pain.       Marland Kitchen triamcinolone (NASACORT AQ) 55 MCG/ACT AERO nasal inhaler Place 1 spray into both nostrils daily as needed (dry mouth).       . triamcinolone ointment (KENALOG) 0.1 % Apply 1 application topically 2 (two) times daily.       No current facility-administered medications on file  prior to visit.    BP 110/70  Pulse 89  Temp(Src) 98.1 F (36.7 C) (Oral)   Resp 20  Ht 5' 9.5" (1.765 m)  Wt 232 lb (105.235 kg)  BMI 33.78 kg/m2  SpO2 98%     Review of Systems  Constitutional: Negative for fever, chills, activity change, appetite change and fatigue.  HENT: Negative for congestion, dental problem, ear pain, hearing loss, mouth sores, rhinorrhea, sinus pressure, sneezing, tinnitus, trouble swallowing and voice change.   Eyes: Negative for photophobia, pain, redness and visual disturbance.  Respiratory: Negative for apnea, cough, choking, chest tightness, shortness of breath and wheezing.   Cardiovascular: Negative for chest pain, palpitations and leg swelling.  Gastrointestinal: Negative for nausea, vomiting, abdominal pain, diarrhea, constipation, blood in stool, abdominal distention, anal bleeding and rectal pain.  Genitourinary: Negative for dysuria, urgency, frequency, hematuria, flank pain, decreased urine volume, discharge, penile swelling, scrotal swelling, difficulty urinating, genital sores and testicular pain.  Musculoskeletal: Positive for arthralgias and back pain. Negative for gait problem, joint swelling, myalgias, neck pain and neck stiffness.  Skin: Negative for color change, rash and wound.  Neurological: Negative for dizziness, tremors, seizures, syncope, facial asymmetry, speech difficulty, weakness, light-headedness, numbness and headaches.  Hematological: Negative for adenopathy. Does not bruise/bleed easily.  Psychiatric/Behavioral: Negative for suicidal ideas, hallucinations, behavioral problems, confusion, sleep disturbance, self-injury, dysphoric mood, decreased concentration and agitation. The patient is not nervous/anxious.        Objective:   Physical Exam  Constitutional: He appears well-developed and well-nourished.  Overweight No distress Blood pressure low normal  HENT:  Head: Normocephalic and atraumatic.  Right Ear: External ear normal.  Left Ear: External ear normal.  Nose: Nose normal.  Mouth/Throat:  Oropharynx is clear and moist.  Eyes: Conjunctivae and EOM are normal. Pupils are equal, round, and reactive to light. No scleral icterus.  Neck: Normal range of motion. Neck supple. No JVD present. No thyromegaly present.  Cardiovascular: Regular rhythm and intact distal pulses.  Exam reveals no gallop and no friction rub.   Murmur heard. Grade 3 over 6 high-pitched systolic murmur  Decreased left dorsalis pedis pulse  Pulmonary/Chest: Effort normal and breath sounds normal. He exhibits no tenderness.  Abdominal: Soft. Bowel sounds are normal. He exhibits no distension and no mass. There is no tenderness.  Genitourinary: Penis normal.  Status post penile prosthesis  Musculoskeletal: Normal range of motion. He exhibits no edema and no tenderness.  Lymphadenopathy:    He has no cervical adenopathy.  Neurological: He is alert. He has normal reflexes. No cranial nerve deficit. Coordination normal.  Skin: Skin is warm and dry. No rash noted.  Multiple surgical scars  Psychiatric: He has a normal mood and affect. His behavior is normal.          Assessment & Plan:   Preventive health examination Hypertrophic cardiomyopathy Hypertension Impaired glucose tolerance Dyslipidemia Chronic pain syndrome Neurosarcoidosis  We'll check updated labs No change in medical regimen Followup multiple consultants Recheck in 6 months or as needed

## 2014-06-25 ENCOUNTER — Telehealth: Payer: Self-pay | Admitting: Internal Medicine

## 2014-06-25 NOTE — Telephone Encounter (Signed)
emmi emailed °

## 2014-07-12 DIAGNOSIS — D869 Sarcoidosis, unspecified: Secondary | ICD-10-CM | POA: Diagnosis not present

## 2014-07-12 DIAGNOSIS — Z79899 Other long term (current) drug therapy: Secondary | ICD-10-CM | POA: Diagnosis not present

## 2014-07-13 ENCOUNTER — Other Ambulatory Visit: Payer: Self-pay | Admitting: Internal Medicine

## 2014-07-17 DIAGNOSIS — L910 Hypertrophic scar: Secondary | ICD-10-CM | POA: Diagnosis not present

## 2014-07-17 DIAGNOSIS — R239 Unspecified skin changes: Secondary | ICD-10-CM | POA: Diagnosis not present

## 2014-07-17 DIAGNOSIS — D869 Sarcoidosis, unspecified: Secondary | ICD-10-CM | POA: Diagnosis not present

## 2014-07-23 DIAGNOSIS — M545 Low back pain: Secondary | ICD-10-CM | POA: Diagnosis not present

## 2014-07-23 DIAGNOSIS — M542 Cervicalgia: Secondary | ICD-10-CM | POA: Diagnosis not present

## 2014-07-23 DIAGNOSIS — Z6834 Body mass index (BMI) 34.0-34.9, adult: Secondary | ICD-10-CM | POA: Diagnosis not present

## 2014-07-23 DIAGNOSIS — D869 Sarcoidosis, unspecified: Secondary | ICD-10-CM | POA: Diagnosis not present

## 2014-07-23 DIAGNOSIS — R309 Painful micturition, unspecified: Secondary | ICD-10-CM | POA: Diagnosis not present

## 2014-07-23 DIAGNOSIS — N528 Other male erectile dysfunction: Secondary | ICD-10-CM | POA: Diagnosis not present

## 2014-07-23 DIAGNOSIS — Z905 Acquired absence of kidney: Secondary | ICD-10-CM | POA: Diagnosis not present

## 2014-07-23 DIAGNOSIS — Z79899 Other long term (current) drug therapy: Secondary | ICD-10-CM | POA: Diagnosis not present

## 2014-07-23 DIAGNOSIS — Z8546 Personal history of malignant neoplasm of prostate: Secondary | ICD-10-CM | POA: Diagnosis not present

## 2014-07-23 DIAGNOSIS — C61 Malignant neoplasm of prostate: Secondary | ICD-10-CM | POA: Diagnosis not present

## 2014-07-24 ENCOUNTER — Ambulatory Visit: Payer: Self-pay | Admitting: Internal Medicine

## 2014-08-08 DIAGNOSIS — D8689 Sarcoidosis of other sites: Secondary | ICD-10-CM | POA: Diagnosis not present

## 2014-08-08 DIAGNOSIS — Z79899 Other long term (current) drug therapy: Secondary | ICD-10-CM | POA: Diagnosis not present

## 2014-08-08 DIAGNOSIS — D869 Sarcoidosis, unspecified: Secondary | ICD-10-CM | POA: Diagnosis not present

## 2014-08-15 ENCOUNTER — Encounter (HOSPITAL_COMMUNITY): Payer: Self-pay | Admitting: Internal Medicine

## 2014-08-20 DIAGNOSIS — R0602 Shortness of breath: Secondary | ICD-10-CM | POA: Diagnosis not present

## 2014-08-20 DIAGNOSIS — D869 Sarcoidosis, unspecified: Secondary | ICD-10-CM | POA: Diagnosis not present

## 2014-08-20 DIAGNOSIS — G4733 Obstructive sleep apnea (adult) (pediatric): Secondary | ICD-10-CM | POA: Diagnosis not present

## 2014-08-27 DIAGNOSIS — R0602 Shortness of breath: Secondary | ICD-10-CM | POA: Diagnosis not present

## 2014-08-27 DIAGNOSIS — D869 Sarcoidosis, unspecified: Secondary | ICD-10-CM | POA: Diagnosis not present

## 2014-08-27 DIAGNOSIS — G4733 Obstructive sleep apnea (adult) (pediatric): Secondary | ICD-10-CM | POA: Diagnosis not present

## 2014-09-02 ENCOUNTER — Other Ambulatory Visit: Payer: Self-pay | Admitting: Internal Medicine

## 2014-10-07 ENCOUNTER — Other Ambulatory Visit: Payer: Self-pay | Admitting: Internal Medicine

## 2014-10-09 DIAGNOSIS — M5412 Radiculopathy, cervical region: Secondary | ICD-10-CM | POA: Diagnosis not present

## 2014-10-09 DIAGNOSIS — M4802 Spinal stenosis, cervical region: Secondary | ICD-10-CM | POA: Diagnosis not present

## 2014-10-14 DIAGNOSIS — M545 Low back pain: Secondary | ICD-10-CM | POA: Diagnosis not present

## 2014-10-15 ENCOUNTER — Emergency Department (HOSPITAL_COMMUNITY)
Admission: EM | Admit: 2014-10-15 | Discharge: 2014-10-15 | Disposition: A | Discharge status: Home / Self Care | Payer: Medicare Other | Attending: Emergency Medicine | Admitting: Emergency Medicine

## 2014-10-15 ENCOUNTER — Telehealth: Payer: Self-pay | Admitting: Internal Medicine

## 2014-10-15 ENCOUNTER — Emergency Department (HOSPITAL_COMMUNITY): Payer: Medicare Other

## 2014-10-15 ENCOUNTER — Encounter (HOSPITAL_COMMUNITY): Payer: Self-pay | Admitting: *Deleted

## 2014-10-15 DIAGNOSIS — Z87891 Personal history of nicotine dependence: Secondary | ICD-10-CM | POA: Diagnosis not present

## 2014-10-15 DIAGNOSIS — Z9889 Other specified postprocedural states: Secondary | ICD-10-CM | POA: Insufficient documentation

## 2014-10-15 DIAGNOSIS — G8929 Other chronic pain: Secondary | ICD-10-CM | POA: Diagnosis not present

## 2014-10-15 DIAGNOSIS — I10 Essential (primary) hypertension: Secondary | ICD-10-CM | POA: Insufficient documentation

## 2014-10-15 DIAGNOSIS — Z79899 Other long term (current) drug therapy: Secondary | ICD-10-CM | POA: Diagnosis not present

## 2014-10-15 DIAGNOSIS — Z87442 Personal history of urinary calculi: Secondary | ICD-10-CM | POA: Insufficient documentation

## 2014-10-15 DIAGNOSIS — F329 Major depressive disorder, single episode, unspecified: Secondary | ICD-10-CM | POA: Diagnosis not present

## 2014-10-15 DIAGNOSIS — Z8739 Personal history of other diseases of the musculoskeletal system and connective tissue: Secondary | ICD-10-CM | POA: Insufficient documentation

## 2014-10-15 DIAGNOSIS — R079 Chest pain, unspecified: Secondary | ICD-10-CM | POA: Diagnosis present

## 2014-10-15 DIAGNOSIS — F419 Anxiety disorder, unspecified: Secondary | ICD-10-CM | POA: Diagnosis not present

## 2014-10-15 DIAGNOSIS — K219 Gastro-esophageal reflux disease without esophagitis: Secondary | ICD-10-CM | POA: Insufficient documentation

## 2014-10-15 DIAGNOSIS — Z8701 Personal history of pneumonia (recurrent): Secondary | ICD-10-CM | POA: Insufficient documentation

## 2014-10-15 DIAGNOSIS — R0602 Shortness of breath: Secondary | ICD-10-CM | POA: Diagnosis not present

## 2014-10-15 DIAGNOSIS — R0789 Other chest pain: Secondary | ICD-10-CM | POA: Diagnosis not present

## 2014-10-15 DIAGNOSIS — E785 Hyperlipidemia, unspecified: Secondary | ICD-10-CM | POA: Diagnosis not present

## 2014-10-15 DIAGNOSIS — Z8546 Personal history of malignant neoplasm of prostate: Secondary | ICD-10-CM | POA: Diagnosis not present

## 2014-10-15 DIAGNOSIS — Z8673 Personal history of transient ischemic attack (TIA), and cerebral infarction without residual deficits: Secondary | ICD-10-CM | POA: Diagnosis not present

## 2014-10-15 DIAGNOSIS — E780 Pure hypercholesterolemia: Secondary | ICD-10-CM | POA: Insufficient documentation

## 2014-10-15 DIAGNOSIS — Z86018 Personal history of other benign neoplasm: Secondary | ICD-10-CM | POA: Insufficient documentation

## 2014-10-15 DIAGNOSIS — E538 Deficiency of other specified B group vitamins: Secondary | ICD-10-CM | POA: Insufficient documentation

## 2014-10-15 DIAGNOSIS — Z86718 Personal history of other venous thrombosis and embolism: Secondary | ICD-10-CM | POA: Diagnosis not present

## 2014-10-15 LAB — BASIC METABOLIC PANEL
Anion gap: 10 (ref 5–15)
BUN: 16 mg/dL (ref 6–23)
CO2: 20 mmol/L (ref 19–32)
Calcium: 9.6 mg/dL (ref 8.4–10.5)
Chloride: 106 mmol/L (ref 96–112)
Creatinine, Ser: 1 mg/dL (ref 0.50–1.35)
GFR calc Af Amer: >90 mL/min (ref >90)
GFR calc non Af Amer: 87 mL/min — ABNORMAL LOW (ref >90)
Glucose, Bld: 101 mg/dL — ABNORMAL HIGH (ref 70–99)
Potassium: 4.4 mmol/L (ref 3.5–5.1)
Sodium: 136 mmol/L (ref 135–145)

## 2014-10-15 LAB — I-STAT TROPONIN, ED
Troponin i, poc: 0 ng/mL (ref 0.00–0.08)
Troponin i, poc: 0 ng/mL (ref 0.00–0.08)

## 2014-10-15 LAB — CBC
HCT: 41 % (ref 39.0–52.0)
Hemoglobin: 13.9 g/dL (ref 13.0–17.0)
MCH: 30.2 pg (ref 26.0–34.0)
MCHC: 33.9 g/dL (ref 30.0–36.0)
MCV: 88.9 fL (ref 78.0–100.0)
Platelets: 352 10*3/uL (ref 150–400)
RBC: 4.61 MIL/uL (ref 4.22–5.81)
RDW: 14.2 % (ref 11.5–15.5)
WBC: 6 10*3/uL (ref 4.0–10.5)

## 2014-10-15 MED ORDER — KETOROLAC TROMETHAMINE 30 MG/ML IJ SOLN
30.0000 mg | Freq: Once | INTRAMUSCULAR | Status: AC
  Administered 2014-10-15: 30 mg via INTRAVENOUS
  Filled 2014-10-15: qty 1

## 2014-10-15 MED ORDER — GI COCKTAIL ~~LOC~~
30.0000 mL | Freq: Once | ORAL | Status: AC
  Administered 2014-10-15: 30 mL via ORAL
  Filled 2014-10-15: qty 30

## 2014-10-15 MED ORDER — OXYCODONE-ACETAMINOPHEN 5-325 MG PO TABS
1.0000 | ORAL_TABLET | Freq: Once | ORAL | Status: AC
  Administered 2014-10-15: 1 via ORAL
  Filled 2014-10-15: qty 1

## 2014-10-15 MED ORDER — OMEPRAZOLE 20 MG PO CPDR: 20.0000 mg | DELAYED_RELEASE_CAPSULE | Freq: Every day | ORAL | Status: DC

## 2014-10-15 NOTE — Telephone Encounter (Signed)
Patient Name: [name]  DOB: 11-21-1965    Initial Comment caller states he has chest pains   Nurse Assessment  Nurse: Thad Ranger, RN, [name] Date/Time (Eastern Time): 10/15/2014 2:58:15 PM  Confirm and document reason for call. If symptomatic, describe symptoms. ---Caller states he has chest pain continuous since yesterday that is continuous since yesterday. States he is having some SOB as well. He is in the car alone driving and approx 7 mins from the hospital.  Has the patient traveled out of the country within the last 30 days? ---Not Applicable  Does the patient require triage? ---Yes  Related visit to physician within the last 2 weeks? ---No  Does the PT have any chronic conditions? (i.e. diabetes, asthma, etc.) ---Yes  List chronic conditions. ---HTN, Heart Murmur, Neurosarcodosis     Guidelines    Guideline Title Affirmed Question Affirmed Notes  Chest Pain [1] Chest pain lasts > 5 minutes AND [2] age > 72 AND [3] at least one cardiac risk factor (i.e., hypertension, diabetes, obesity, smoker or strong family history of heart disease) Also being treated for HTN.   Final Disposition User   Call EMS 911 Now [name], RN, [name]    Comments  REFUSED TO CALL 911 AND IS ON THE WAY TO THE [facility] NOW VIA PRIVATE [place]. HE IS JUST A FEW MINUTES FROM THE ER.

## 2014-10-15 NOTE — Discharge Instructions (Signed)
Chest Pain (Nonspecific) °It is often hard to give a specific diagnosis for the cause of chest pain. There is always a chance that your pain could be related to something serious, such as a heart attack or a blood clot in the lungs. You need to follow up with your health care provider for further evaluation. °CAUSES  °· Heartburn. °· Pneumonia or bronchitis. °· Anxiety or stress. °· Inflammation around your heart (pericarditis) or lung (pleuritis or pleurisy). °· A blood clot in the lung. °· A collapsed lung (pneumothorax). It can develop suddenly on its own (spontaneous pneumothorax) or from trauma to the chest. °· Shingles infection (herpes zoster virus). °The chest wall is composed of bones, muscles, and cartilage. Any of these can be the source of the pain. °· The bones can be bruised by injury. °· The muscles or cartilage can be strained by coughing or overwork. °· The cartilage can be affected by inflammation and become sore (costochondritis). °DIAGNOSIS  °Lab tests or other studies may be needed to find the cause of your pain. Your health care provider may have you take a test called an ambulatory electrocardiogram (ECG). An ECG records your heartbeat patterns over a 24-hour period. You may also have other tests, such as: °· Transthoracic echocardiogram (TTE). During echocardiography, sound waves are used to evaluate how blood flows through your heart. °· Transesophageal echocardiogram (TEE). °· Cardiac monitoring. This allows your health care provider to monitor your heart rate and rhythm in real time. °· Holter monitor. This is a portable device that records your heartbeat and can help diagnose heart arrhythmias. It allows your health care provider to track your heart activity for several days, if needed. °· Stress tests by exercise or by giving medicine that makes the heart beat faster. °TREATMENT  °· Treatment depends on what may be causing your chest pain. Treatment may include: °· Acid blockers for  heartburn. °· Anti-inflammatory medicine. °· Pain medicine for inflammatory conditions. °· Antibiotics if an infection is present. °· You may be advised to change lifestyle habits. This includes stopping smoking and avoiding alcohol, caffeine, and chocolate. °· You may be advised to keep your head raised (elevated) when sleeping. This reduces the chance of acid going backward from your stomach into your esophagus. °Most of the time, nonspecific chest pain will improve within 2-3 days with rest and mild pain medicine.  °HOME CARE INSTRUCTIONS  °· If antibiotics were prescribed, take them as directed. Finish them even if you start to feel better. °· For the next few days, avoid physical activities that bring on chest pain. Continue physical activities as directed. °· Do not use any tobacco products, including cigarettes, chewing tobacco, or electronic cigarettes. °· Avoid drinking alcohol. °· Only take medicine as directed by your health care provider. °· Follow your health care provider's suggestions for further testing if your chest pain does not go away. °· Keep any follow-up appointments you made. If you do not go to an appointment, you could develop lasting (chronic) problems with pain. If there is any problem keeping an appointment, call to reschedule. °SEEK MEDICAL CARE IF:  °· Your chest pain does not go away, even after treatment. °· You have a rash with blisters on your chest. °· You have a fever. °SEEK IMMEDIATE MEDICAL CARE IF:  °· You have increased chest pain or pain that spreads to your arm, neck, jaw, back, or abdomen. °· You have shortness of breath. °· You have an increasing cough, or you cough   up blood. °· You have severe back or abdominal pain. °· You feel nauseous or vomit. °· You have severe weakness. °· You faint. °· You have chills. °This is an emergency. Do not wait to see if the pain will go away. Get medical help at once. Call your local emergency services (911 in [place] Do not drive  yourself to the hospital. °MAKE SURE YOU:  °· Understand these instructions. °· Will watch your condition. °· Will get help right away if you are not doing well or get worse. °Document Released: 06/02/2005 Document Revised: 08/28/2013 Document Reviewed: 03/28/2008 °Exit[employer]® Patient Information ©2015 [employer]. This information is not intended to replace advice given to you by your health care provider. Make sure you discuss any questions you have with your health care provider. °Gastroesophageal Reflux Disease, Adult °Gastroesophageal reflux disease (GERD) happens when acid from your stomach flows up into the esophagus. When acid comes in contact with the esophagus, the acid causes soreness (inflammation) in the esophagus. Over time, GERD may create small holes (ulcers) in the lining of the esophagus. °CAUSES  °· Increased body weight. This puts pressure on the stomach, making acid rise from the stomach into the esophagus. °· Smoking. This increases acid production in the stomach. °· Drinking alcohol. This causes decreased pressure in the lower esophageal sphincter (valve or ring of muscle between the esophagus and stomach), allowing acid from the stomach into the esophagus. °· Late evening meals and a full stomach. This increases pressure and acid production in the stomach. °· A malformed lower esophageal sphincter. °Sometimes, no cause is found. °SYMPTOMS  °· Burning pain in the lower part of the mid-chest behind the breastbone and in the mid-stomach area. This may occur twice a week or more often. °· Trouble swallowing. °· Sore throat. °· Dry cough. °· Asthma-like symptoms including chest tightness, shortness of breath, or wheezing. °DIAGNOSIS  °Your caregiver may be able to diagnose GERD based on your symptoms. In some cases, X-rays and other tests may be done to check for complications or to check the condition of your stomach and esophagus. °TREATMENT  °Your caregiver may recommend over-the-counter or  prescription medicines to help decrease acid production. Ask your caregiver before starting or adding any new medicines.  °HOME CARE INSTRUCTIONS  °· Change the factors that you can control. Ask your caregiver for guidance concerning weight loss, quitting smoking, and alcohol consumption. °· Avoid foods and drinks that make your symptoms worse, such as: °¨ Caffeine or alcoholic drinks. °¨ Chocolate. °¨ Peppermint or mint flavorings. °¨ Garlic and onions. °¨ Spicy foods. °¨ Citrus fruits, such as oranges, lemons, or limes. °¨ Tomato-based foods such as sauce, chili, salsa, and pizza. °¨ Fried and fatty foods. °· Avoid lying down for the 3 hours prior to your bedtime or prior to taking a nap. °· Eat small, frequent meals instead of large meals. °· Wear loose-fitting clothing. Do not wear anything tight around your waist that causes pressure on your stomach. °· Raise the head of your bed 6 to 8 inches with wood blocks to help you sleep. Extra pillows will not help. °· Only take over-the-counter or prescription medicines for pain, discomfort, or fever as directed by your caregiver. °· Do not take aspirin, ibuprofen, or other nonsteroidal anti-inflammatory drugs (NSAIDs). °SEEK IMMEDIATE MEDICAL CARE IF:  °· You have pain in your arms, neck, jaw, teeth, or back. °· Your pain increases or changes in intensity or duration. °· You develop nausea, vomiting, or sweating (diaphoresis). °·   You develop shortness of breath, or you faint. °· Your vomit is green, yellow, black, or looks like coffee grounds or blood. °· Your stool is red, bloody, or black. °These symptoms could be signs of other problems, such as heart disease, gastric bleeding, or esophageal bleeding. °MAKE SURE YOU:  °· Understand these instructions. °· Will watch your condition. °· Will get help right away if you are not doing well or get worse. °Document Released: 06/02/2005 Document Revised: 11/15/2011 Document Reviewed: 03/12/2011 °ExitCare® Patient  Information ©2015 [employer]. This information is not intended to replace advice given to you by your health care provider. Make sure you discuss any questions you have with your health care provider. ° °

## 2014-10-15 NOTE — Telephone Encounter (Signed)
Noted. FYI 

## 2014-10-15 NOTE — ED Provider Notes (Signed)
CSN: [id number]     Arrival date & time 10/15/14  1516 History   First MD Initiated Contact with Patient 10/15/14 1656     Chief Complaint  Patient presents with  . Chest Pain      HPI Patient presents with constant chest pain since yesterday.  He reports this is left-sided chest pain with radiation to the left shoulder.  His pain is been rather constant since yesterday.  He did have some shortness of breath yesterday but no shortness of breath currently.  He still has 5 out of 10 discomfort at this time.  He states at times it feels sharp and at times it feels like pressure.  He had a heart catheter in 2014 which demonstrated normal coronary arteries.  Please see electronic medical record for complete details of the heart catheterization in November 2014.  Denies recent travel or surgery.  No history of PE or DVT.  Reports cough without productivity.  No fevers or chills.  No shortness of breath at this time.  Denies back pain.   Past Medical History  Diagnosis Date  . B12 DEFICIENCY 03/06/2007  . CARDIOMYOPATHY, IDIOPATHIC HYPERTROPHIC 03/06/2007  . DEPRESSION 03/06/2007  . GERD 06/01/2007  . HYPERCHOLESTEROLEMIA 03/06/2007  . HYPERLIPIDEMIA 11/08/2008  . IMPAIRED GLUCOSE TOLERANCE 11/08/2008  . NEPHROLITHIASIS, HX OF 04/07/2010  . 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 OSTEOARTHRITIS 06/01/2007  . Palpitations 10/23/2007  . SYNDROME, CHRONIC PAIN 03/06/2007  . Cancer     prostate ca  . Prostate cancer   . HYPERTENSION 03/06/2007    [name]  . Pneumonia     hx of  . Dysrhythmia     sees [name] for "palpitations"  . DVT (deep venous thrombosis)   . Anxiety   . Peripheral vascular disease 03    dvt  . SLEEP APNEA 03/06/2007    uses vpap, last sleep study 2011, Dr. Alanson Puls, at [place] [facility] breath     infreq  . History of kidney stones   . Anginal pain     s/p cardiac cath 07/19/13 showing NL coronaries, EF 50-55%   Past Surgical History  Procedure Laterality Date  .  Decompression and fusion      cervicothoracic  . Partial nephrectomy Left     bx  . Cardiac catheterization    . Lumbar fusion    . Prostate surgery      robotic prostatectomy  . Penile prosthesis implant    . Tonsillectomy    . Leg skin lesion  biopsy / excision      left leg  . Eye surgery Bilateral     blepharoplasty  . Spinal cord stimulator insertion N/A 04/11/2014    Procedure: LUMBAR SPINAL CORD STIMULATOR INSERTION;  Surgeon: Newman Pies, MD;  Location: [facility];  Service: Neurosurgery;  Laterality: N/A;  . Left heart catheterization with coronary angiogram N/A 07/19/2013    Procedure: LEFT HEART CATHETERIZATION WITH CORONARY ANGIOGRAM;  Surgeon: Jolaine Artist, MD;  Location: [facility] CATH LAB;  Service: Cardiovascular;  Laterality: N/A;   Family History  Problem Relation Age of Onset  . Heart disease Neg Hx     family hx CHF  . Kidney disease Neg Hx     family hx   . Cancer Neg Hx     family hx prostate   History  Substance Use Topics  . Smoking status: Former Smoker -- 0.50 packs/day for 3 years    Types: Cigarettes  Quit date: 09/07/1983  . Smokeless tobacco: Never Used  . Alcohol Use: No    Review of Systems  All other systems reviewed and are negative.     Allergies  Adhesive and Pollen extract  Home Medications   Prior to Admission medications   Medication Sig Start Date End Date Taking? Authorizing Provider  albuterol (PROVENTIL HFA;VENTOLIN HFA) 108 (90 BASE) MCG/ACT inhaler Inhale 2 puffs into the lungs 2 (two) times daily as needed for wheezing or shortness of breath.    Yes Historical Provider, MD  amitriptyline (ELAVIL) 25 MG tablet Take 1 tablet (25 mg total) by mouth at bedtime. 09/12/12  Yes Marletta Lor, MD  atenolol (TENORMIN) 25 MG tablet Take 25 mg by mouth 2 (two) times daily.   Yes Historical Provider, MD  atorvastatin (LIPITOR) 40 MG tablet Take 40 mg by mouth daily.   Yes Historical Provider, MD  azaTHIOprine (IMURAN) 50  MG tablet Take 1 tablet (50 mg total) by mouth 2 (two) times daily. 01/17/14  Yes Marletta Lor, MD  buPROPion (WELLBUTRIN XL) 300 MG 24 hr tablet Take 300 mg by mouth daily.   Yes Historical Provider, MD  cyanocobalamin (,VITAMIN B-12,) 1000 MCG/ML injection Inject 1,000 mcg into the muscle every 30 (thirty) days. On or about the 1st of August   Yes Historical Provider, MD  cyclobenzaprine (FLEXERIL) 10 MG tablet Take 10 mg by mouth 3 (three) times daily as needed for muscle spasms.  06/06/13  Yes Historical Provider, MD  cycloSPORINE (RESTASIS) 0.05 % ophthalmic emulsion Place 2 drops into both eyes 2 (two) times daily.    Yes Historical Provider, MD  dicyclomine (BENTYL) 10 MG capsule Take 10 mg by mouth 4 (four) times daily.   Yes Historical Provider, MD  docusate sodium (COLACE) 100 MG capsule Take 100 mg by mouth at bedtime.    Yes Historical Provider, MD  DULoxetine (CYMBALTA) 30 MG capsule Take 30 mg by mouth daily. Take with a 30 mg capsule for a 90 mg dose   Yes Historical Provider, MD  DULoxetine (CYMBALTA) 30 MG capsule TAKE ONE CAPSULE BY MOUTH DAILY 07/15/14  Yes Marletta Lor, MD  DULoxetine (CYMBALTA) 60 MG capsule Take 60 mg by mouth daily. Take with a 30 mg capsule for a 90 mg dose   Yes Historical Provider, MD  erythromycin ophthalmic ointment Place 1 application into both eyes at bedtime.  04/15/14  Yes Historical Provider, MD  esomeprazole (NEXIUM) 40 MG capsule Take 40 mg by mouth daily at 12 noon.   Yes Historical Provider, MD  gabapentin (NEURONTIN) 300 MG capsule Take 900 mg by mouth 4 (four) times daily.    Yes Historical Provider, MD  gabapentin (NEURONTIN) 300 MG capsule TAKE 3 CAPSULES BY MOUTH FOUR TIMES DAILY 09/02/14  Yes Marletta Lor, MD  guaiFENesin (MUCINEX) 600 MG 12 hr tablet Take 600 mg by mouth 2 (two) times daily.    Yes Historical Provider, MD  ibuprofen (ADVIL,MOTRIN) 600 MG tablet Take 1 tablet (600 mg total) by mouth every 6 (six) hours as  needed. 04/19/14  Yes Varney Biles, MD  NEXIUM 40 MG capsule TAKE ONE CAPSULE BY MOUTH DAILY 10/09/14  Yes Marletta Lor, MD  oxybutynin (DITROPAN) 5 MG tablet Take 5 mg by mouth at bedtime.    Yes Historical Provider, MD  oxyCODONE-acetaminophen (PERCOCET) 10-325 MG per tablet Take 1 tablet by mouth every 4 (four) hours as needed for pain. Prescribed by Dr. Assunta Curtis 04/12/14  Yes Newman Pies, MD  Polyethyl Glycol-Propyl Glycol (SYSTANE PRESERVATIVE FREE OP) Place 1 drop into both eyes 2 (two) times daily.   Yes Historical Provider, MD  predniSONE (DELTASONE) 5 MG tablet Take 5 mg by mouth daily. Continuous course   Yes Historical Provider, MD  promethazine (PHENERGAN) 25 MG tablet Take 25 mg by mouth every 6 (six) hours as needed for nausea.    Yes Historical Provider, MD  traMADol (ULTRAM) 50 MG tablet Take 50 mg by mouth daily as needed for moderate pain.    Yes Historical Provider, MD  triamcinolone (NASACORT AQ) 55 MCG/ACT AERO nasal inhaler Place 1 spray into both nostrils daily as needed (dry mouth).    Yes Historical Provider, MD  triamcinolone ointment (KENALOG) 0.1 % Apply 1 application topically 2 (two) times daily.   Yes Historical Provider, MD  omeprazole (PRILOSEC) 20 MG capsule Take 1 capsule (20 mg total) by mouth daily. 10/15/14   Hoy Morn, MD   BP 122/75 mmHg  Pulse 75  Temp(Src) 97.9 F (36.6 C) (Oral)  Resp 18  SpO2 96% Physical Exam  Constitutional: He is oriented to person, place, and time. He appears well-developed and well-nourished.  HENT:  Head: Normocephalic and atraumatic.  Eyes: EOM are normal.  Neck: Normal range of motion.  Cardiovascular: Normal rate, regular rhythm, normal heart sounds and intact distal pulses.   Pulmonary/Chest: Effort normal and breath sounds normal. No respiratory distress.  Abdominal: Soft. He exhibits no distension. There is no tenderness.  Musculoskeletal: Normal range of motion.  Neurological: He is alert and oriented to  person, place, and time.  Skin: Skin is warm and dry.  Psychiatric: He has a normal mood and affect. Judgment normal.  Nursing note and vitals reviewed.   ED Course  Procedures (including critical care time) Labs Review Labs Reviewed  BASIC METABOLIC PANEL - Abnormal; Notable for the following:    Glucose, Bld 101 (*)    GFR calc non Af Amer 87 (*)    All other components within normal limits  CBC  I-STAT TROPOININ, ED  I-STAT TROPOININ, ED    Imaging Review Dg Chest 2 View  10/15/2014   CLINICAL DATA:  Constant mid chest pain with radiation into left shoulder for 2 days.  EXAM: CHEST  2 VIEW  COMPARISON:  04/10/2014  FINDINGS: The heart size and mediastinal contours are within normal limits. Both lungs are clear. The visualized skeletal structures are unremarkable. Spinal stimulator device is in place projecting over the upper thoracic spine.  IMPRESSION: No active cardiopulmonary disease.   Electronically Signed   By: Rolm Baptise M.D.   On: 10/15/2014 16:08     EKG Interpretation   Date/Time:  Tuesday October 15 2014 15:32:42 EST Ventricular Rate:  85 PR Interval:  152 QRS Duration: 76 QT Interval:  338 QTC Calculation: 402 R Axis:   28 Text Interpretation:  Sinus rhythm with occasional Premature ventricular  complexes Otherwise normal ECG No significant change was found Confirmed  by Lawrencia Mauney  MD, Doretta Remmert ([id number]) on 10/15/2014 5:38:10 PM      MDM   Final diagnoses:  Chest pain, unspecified chest pain type    Atypical chest pain.  Likely GI related.  Some improvement with GI cocktail.  Home with Prilosec.  EKG normal sinus rhythm without ischemic changes.  Troponin 2 is negative.  Heart catheterization November 2014 demonstrates no obstructive coronary artery disease and essentially normal coronary arteries.    Hoy Morn, MD 10/15/14  2129 

## 2014-10-15 NOTE — ED Notes (Signed)
Lt. Side chest pain since yesterday, hx. Of a clot in knee; pain makes him sick; inc. Pain respirations.

## 2014-11-10 IMAGING — RF DG LUMBAR SPINE 2-3V
1 series · 2 of 2 positions shown · non-contrast
Comparison: CT of the 07/26/2012.

CLINICAL DATA: L5-S1 revision.

DG C-ARM 1-60 MIN,LUMBAR SPINE - 2-3 VIEW

[Series 1: run · 2 of 2 slices shown]
[im 1/2]
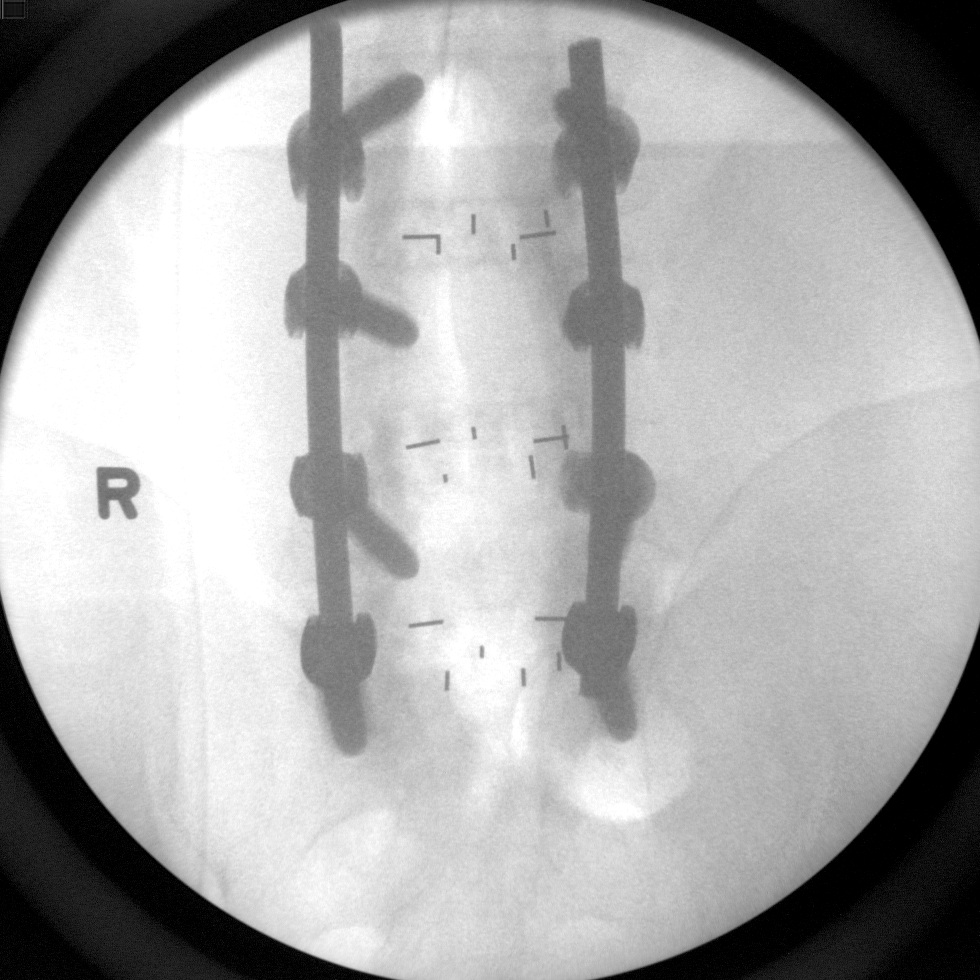
[im 2/2]
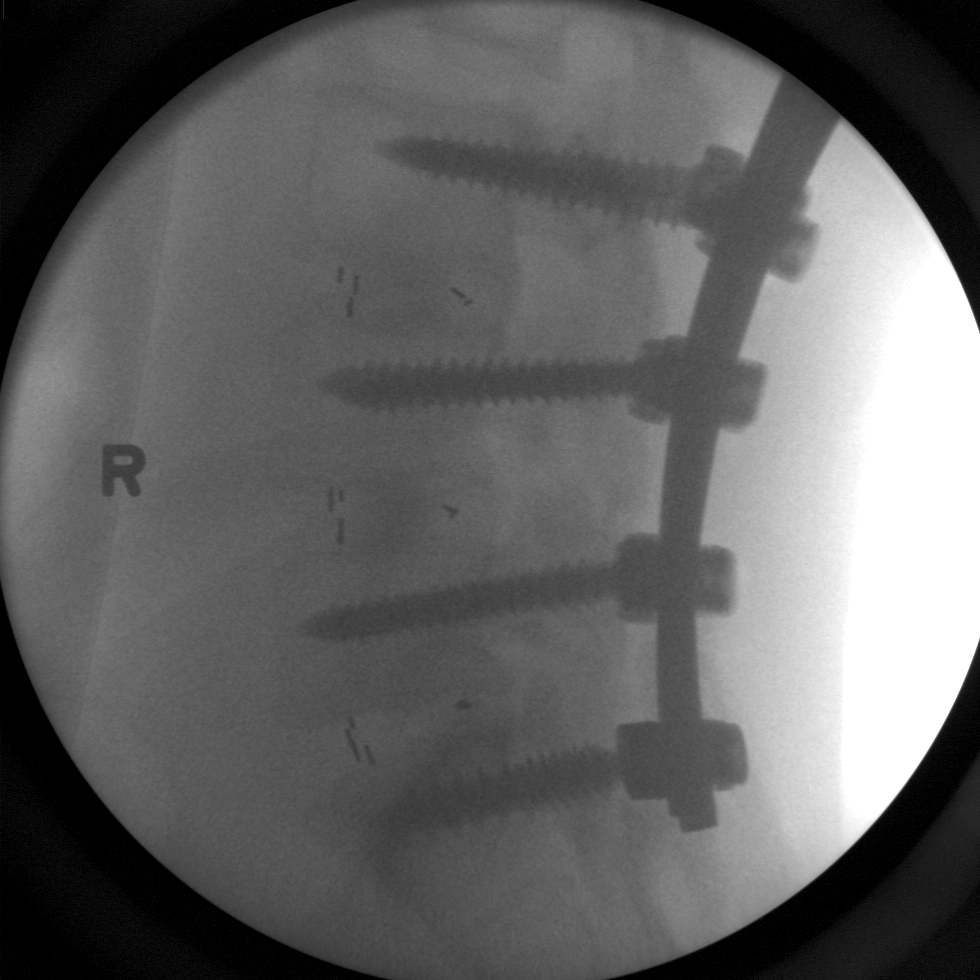

[2 of 2 positions shown; findings below may reference images not displayed]

FINDINGS: AP and lateral intraoperative views of the lumbosacral
spine demonstrate trans pedicle screw fixation at 4 levels, L3-S1.
Interbody fusion material.
IMPRESSION: Intraoperative imaging.

## 2014-11-29 IMAGING — CR DG CHEST 2V
2 series · 2 of 2 positions shown · non-contrast
Comparison: 12/29/2011

CLINICAL DATA: Short of breath

CHEST - 2 VIEW

[w chest pa]
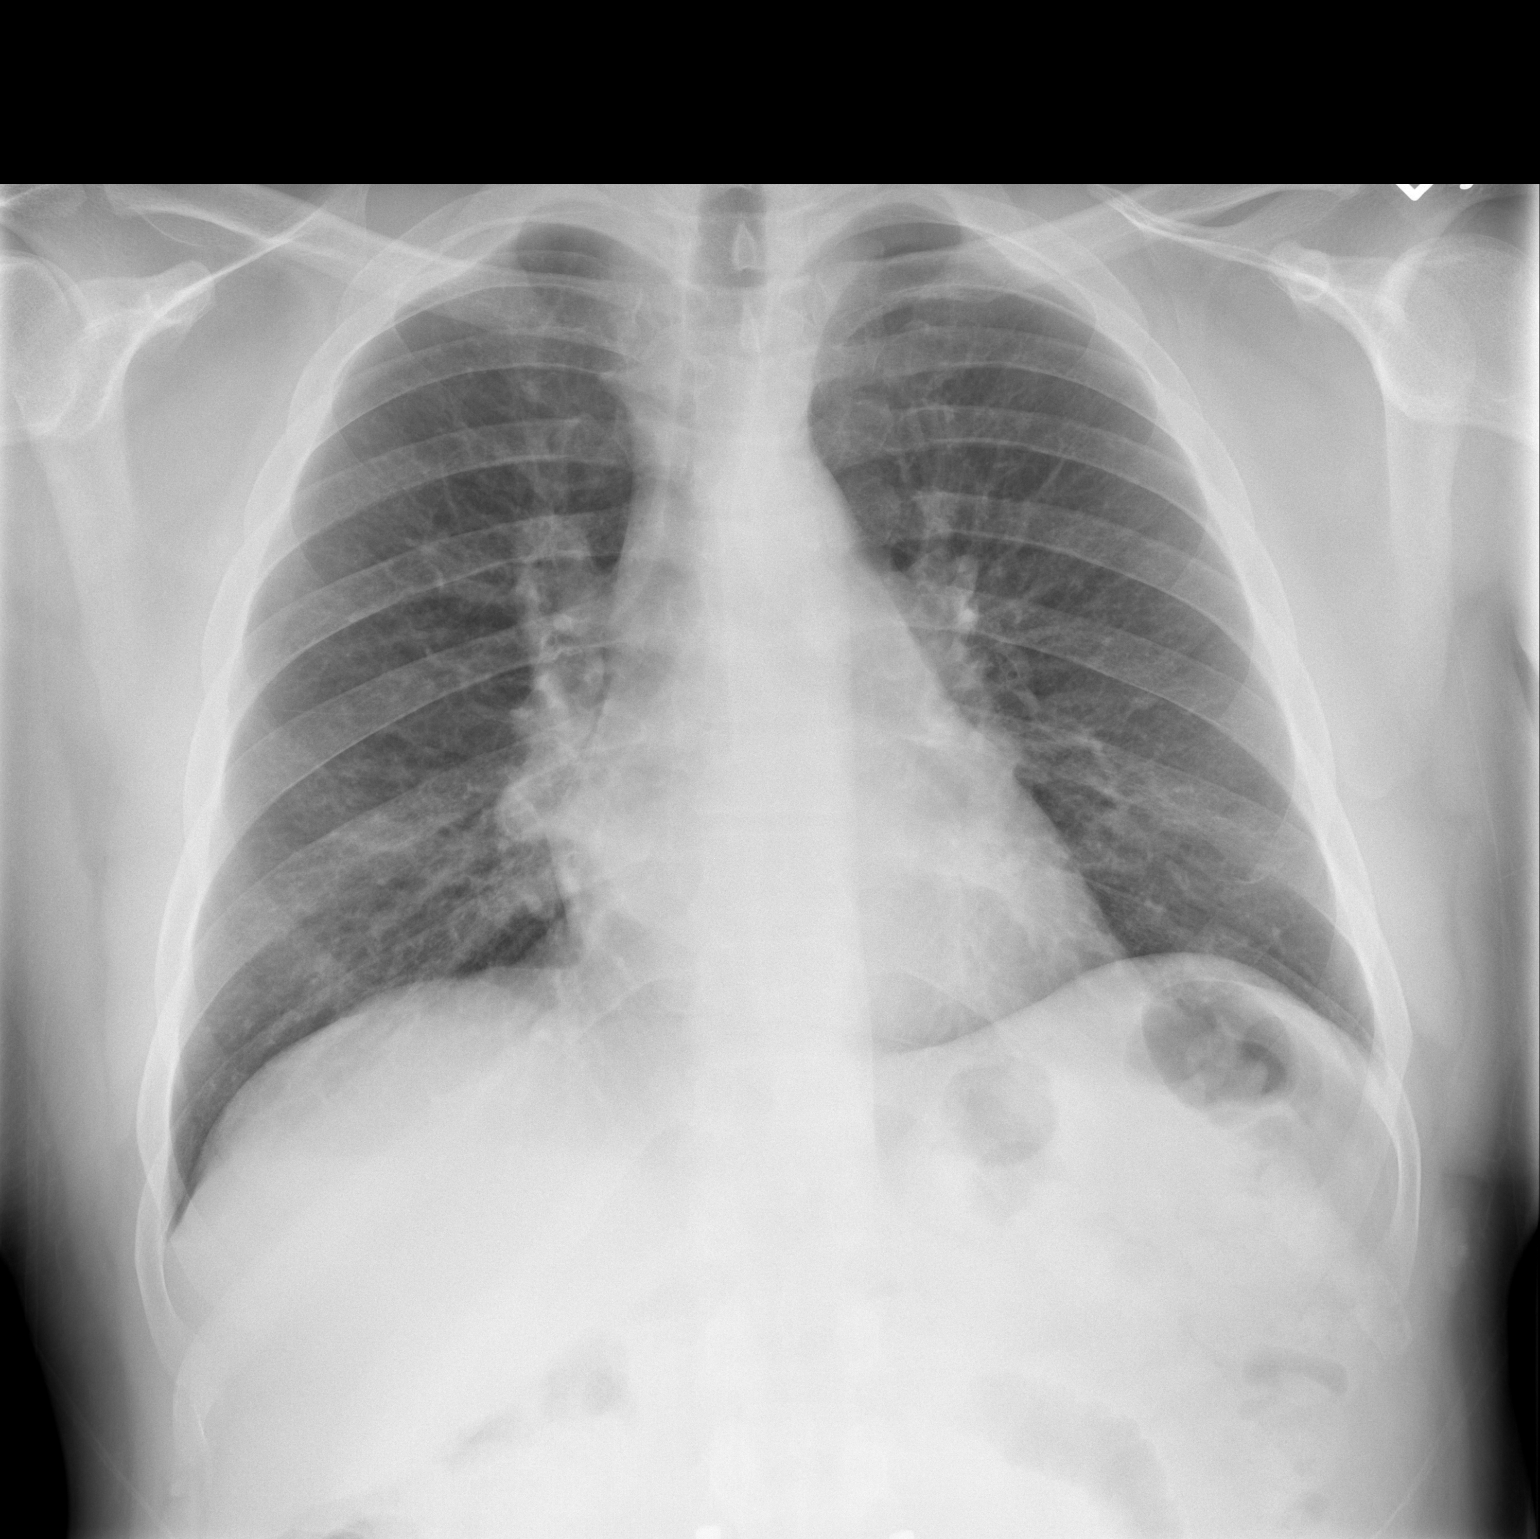

[w chest lat]
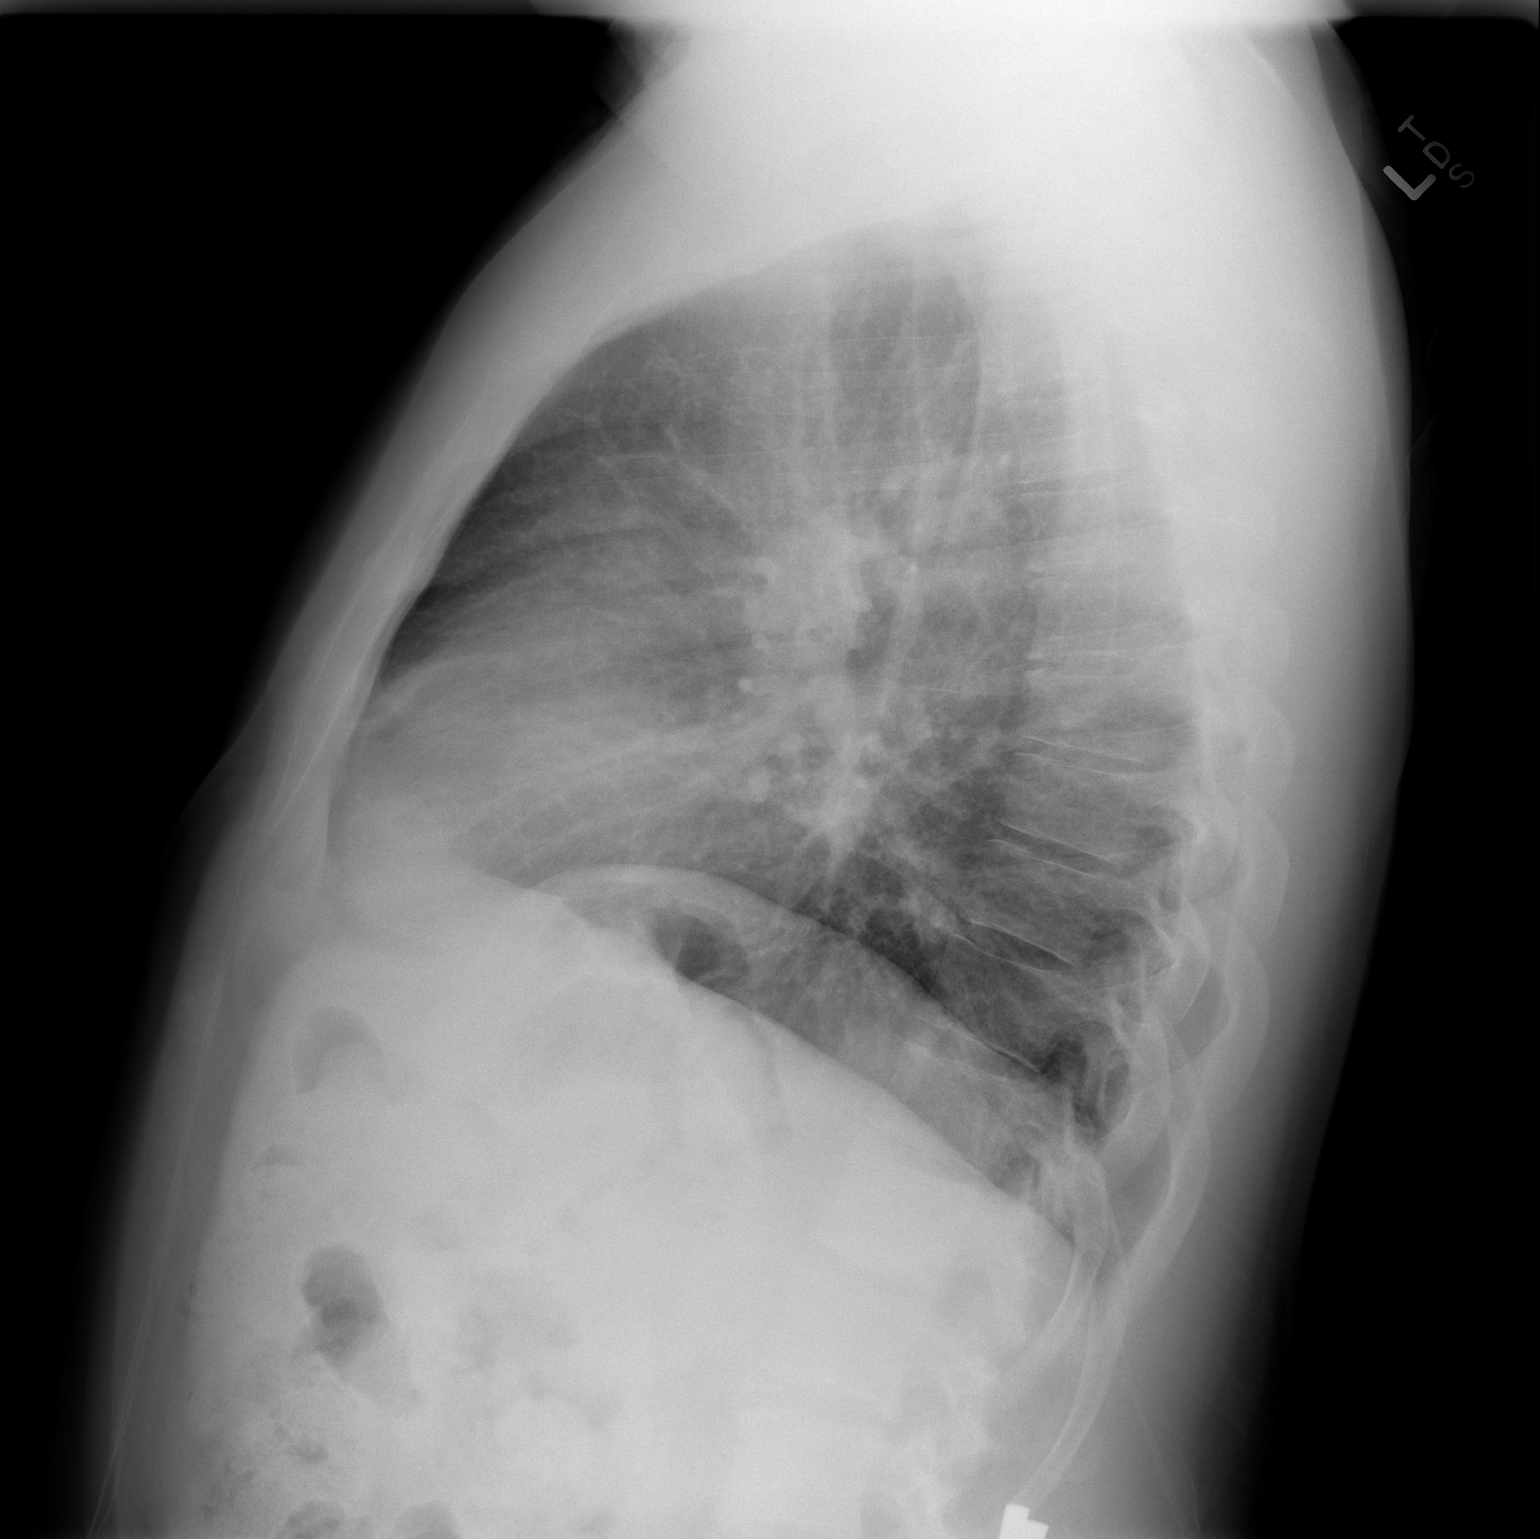

[2 of 2 positions shown; findings below may reference images not displayed]

FINDINGS: Heart size and pulmonary vascularity are normal.
Negative for heart failure.  Negative for infiltrate effusion or
mass lesion. Right lower hilar adenopathy is unchanged from the CT
of 08/18/2011.  History of sarcoid.
IMPRESSION: No acute abnormality.

## 2014-12-04 DIAGNOSIS — M5416 Radiculopathy, lumbar region: Secondary | ICD-10-CM | POA: Diagnosis not present

## 2014-12-06 ENCOUNTER — Other Ambulatory Visit: Payer: Self-pay | Admitting: Internal Medicine

## 2014-12-30 DIAGNOSIS — Z87442 Personal history of urinary calculi: Secondary | ICD-10-CM | POA: Diagnosis not present

## 2014-12-30 DIAGNOSIS — F329 Major depressive disorder, single episode, unspecified: Secondary | ICD-10-CM | POA: Diagnosis not present

## 2014-12-30 DIAGNOSIS — Z8546 Personal history of malignant neoplasm of prostate: Secondary | ICD-10-CM | POA: Diagnosis not present

## 2014-12-30 DIAGNOSIS — Z79899 Other long term (current) drug therapy: Secondary | ICD-10-CM | POA: Diagnosis not present

## 2014-12-30 DIAGNOSIS — G4733 Obstructive sleep apnea (adult) (pediatric): Secondary | ICD-10-CM | POA: Diagnosis not present

## 2014-12-30 DIAGNOSIS — I10 Essential (primary) hypertension: Secondary | ICD-10-CM | POA: Diagnosis not present

## 2014-12-30 DIAGNOSIS — Z87891 Personal history of nicotine dependence: Secondary | ICD-10-CM | POA: Diagnosis not present

## 2014-12-30 DIAGNOSIS — K219 Gastro-esophageal reflux disease without esophagitis: Secondary | ICD-10-CM | POA: Diagnosis not present

## 2014-12-30 DIAGNOSIS — Z09 Encounter for follow-up examination after completed treatment for conditions other than malignant neoplasm: Secondary | ICD-10-CM | POA: Diagnosis not present

## 2014-12-30 DIAGNOSIS — D869 Sarcoidosis, unspecified: Secondary | ICD-10-CM | POA: Diagnosis not present

## 2014-12-30 DIAGNOSIS — D8689 Sarcoidosis of other sites: Secondary | ICD-10-CM | POA: Diagnosis not present

## 2014-12-30 DIAGNOSIS — R51 Headache: Secondary | ICD-10-CM | POA: Diagnosis not present

## 2014-12-30 DIAGNOSIS — E785 Hyperlipidemia, unspecified: Secondary | ICD-10-CM | POA: Diagnosis not present

## 2014-12-30 DIAGNOSIS — Z905 Acquired absence of kidney: Secondary | ICD-10-CM | POA: Diagnosis not present

## 2015-01-16 ENCOUNTER — Other Ambulatory Visit: Payer: Self-pay | Admitting: Internal Medicine

## 2015-01-21 DIAGNOSIS — M5416 Radiculopathy, lumbar region: Secondary | ICD-10-CM | POA: Diagnosis not present

## 2015-01-21 DIAGNOSIS — M542 Cervicalgia: Secondary | ICD-10-CM | POA: Diagnosis not present

## 2015-01-21 DIAGNOSIS — Z6834 Body mass index (BMI) 34.0-34.9, adult: Secondary | ICD-10-CM | POA: Diagnosis not present

## 2015-01-21 DIAGNOSIS — M545 Low back pain: Secondary | ICD-10-CM | POA: Diagnosis not present

## 2015-01-29 ENCOUNTER — Other Ambulatory Visit: Payer: Self-pay | Admitting: Neurosurgery

## 2015-01-29 DIAGNOSIS — M545 Low back pain, unspecified: Secondary | ICD-10-CM

## 2015-01-29 DIAGNOSIS — M542 Cervicalgia: Secondary | ICD-10-CM

## 2015-01-29 DIAGNOSIS — G8929 Other chronic pain: Secondary | ICD-10-CM

## 2015-02-14 ENCOUNTER — Other Ambulatory Visit: Payer: Self-pay

## 2015-02-21 ENCOUNTER — Other Ambulatory Visit: Payer: Self-pay

## 2015-02-25 DIAGNOSIS — G4733 Obstructive sleep apnea (adult) (pediatric): Secondary | ICD-10-CM | POA: Diagnosis not present

## 2015-02-25 DIAGNOSIS — D869 Sarcoidosis, unspecified: Secondary | ICD-10-CM | POA: Diagnosis not present

## 2015-03-12 DIAGNOSIS — Z6834 Body mass index (BMI) 34.0-34.9, adult: Secondary | ICD-10-CM | POA: Diagnosis not present

## 2015-03-12 DIAGNOSIS — M5416 Radiculopathy, lumbar region: Secondary | ICD-10-CM | POA: Diagnosis not present

## 2015-03-21 ENCOUNTER — Ambulatory Visit
Admission: RE | Admit: 2015-03-21 | Discharge: 2015-03-21 | Disposition: A | Discharge status: Home / Self Care | Payer: Medicare Other | Source: Ambulatory Visit | Attending: Neurosurgery | Admitting: Neurosurgery

## 2015-03-21 DIAGNOSIS — G8929 Other chronic pain: Secondary | ICD-10-CM

## 2015-03-21 DIAGNOSIS — M545 Low back pain, unspecified: Secondary | ICD-10-CM

## 2015-03-21 DIAGNOSIS — M5021 Other cervical disc displacement,  high cervical region: Secondary | ICD-10-CM | POA: Diagnosis not present

## 2015-03-21 DIAGNOSIS — M47816 Spondylosis without myelopathy or radiculopathy, lumbar region: Secondary | ICD-10-CM | POA: Diagnosis not present

## 2015-03-21 DIAGNOSIS — M542 Cervicalgia: Secondary | ICD-10-CM

## 2015-03-21 DIAGNOSIS — M5022 Other cervical disc displacement, mid-cervical region: Secondary | ICD-10-CM | POA: Diagnosis not present

## 2015-03-21 DIAGNOSIS — M5126 Other intervertebral disc displacement, lumbar region: Secondary | ICD-10-CM | POA: Diagnosis not present

## 2015-03-21 DIAGNOSIS — M47812 Spondylosis without myelopathy or radiculopathy, cervical region: Secondary | ICD-10-CM | POA: Diagnosis not present

## 2015-03-21 MED ORDER — ONDANSETRON HCL 4 MG/2ML IJ SOLN
4.0000 mg | Freq: Once | INTRAMUSCULAR | Status: AC
  Administered 2015-03-21: 4 mg via INTRAMUSCULAR

## 2015-03-21 MED ORDER — IOHEXOL 300 MG/ML  SOLN
10.0000 mL | Freq: Once | INTRAMUSCULAR | Status: AC | PRN
  Administered 2015-03-21: 10 mL via INTRATHECAL

## 2015-03-21 MED ORDER — ONDANSETRON HCL 4 MG/2ML IJ SOLN: 4.0000 mg | Freq: Four times a day (QID) | INTRAMUSCULAR | Status: DC | PRN

## 2015-03-21 MED ORDER — MEPERIDINE HCL 100 MG/ML IJ SOLN
100.0000 mg | Freq: Once | INTRAMUSCULAR | Status: AC
  Administered 2015-03-21: 100 mg via INTRAMUSCULAR

## 2015-03-21 MED ORDER — DIAZEPAM 5 MG PO TABS
10.0000 mg | ORAL_TABLET | Freq: Once | ORAL | Status: AC
  Administered 2015-03-21: 10 mg via ORAL

## 2015-03-21 NOTE — Discharge Instructions (Signed)
Myelogram Discharge Instructions  1. Go home and rest quietly for the next 24 hours.  It is important to lie flat for the next 24 hours.  Get up only to go to the restroom.  You may lie in the bed or on a couch on your back, your stomach, your left side or your right side.  You may have one pillow under your head.  You may have pillows between your knees while you are on your side or under your knees while you are on your back.  2. DO NOT drive today.  Recline the seat as far back as it will go, while still wearing your seat belt, on the way home.  3. You may get up to go to the bathroom as needed.  You may sit up for 10 minutes to eat.  You may resume your normal diet and medications unless otherwise indicated.  Drink lots of extra fluids today and tomorrow.  4. The incidence of headache, nausea, or vomiting is about 5% (one in 20 patients).  If you develop a headache, lie flat and drink plenty of fluids until the headache goes away.  Caffeinated beverages may be helpful.  If you develop severe nausea and vomiting or a headache that does not go away with flat bed rest, call [phone].  5. You may resume normal activities after your 24 hours of bed rest is over; however, do not exert yourself strongly or do any heavy lifting tomorrow. If when you get up you have a headache when standing, go back to bed and force fluids for another 24 hours.  6. Call your physician for a follow-up appointment.  The results of your myelogram will be sent directly to your physician by the following day.  7. If you have any questions or if complications develop after you arrive home, please call [phone].  Discharge instructions have been explained to the patient.  The patient, or the person responsible for the patient, fully understands these instructions.        May resume Phenergan, Cymbalta, Wellbutrin and Amitriptyline on March 22, 2015, after 9:30 am.

## 2015-03-21 NOTE — Progress Notes (Signed)
Patient states he has been off Amitriptyline, Cymbalta, Phenergan and Wellbutrin for at least the past two days.  Brita Romp, RN

## 2015-04-04 DIAGNOSIS — H0259 Other disorders affecting eyelid function: Secondary | ICD-10-CM | POA: Diagnosis not present

## 2015-04-04 DIAGNOSIS — H04123 Dry eye syndrome of bilateral lacrimal glands: Secondary | ICD-10-CM | POA: Diagnosis not present

## 2015-04-04 DIAGNOSIS — H532 Diplopia: Secondary | ICD-10-CM | POA: Diagnosis not present

## 2015-04-04 DIAGNOSIS — H53002 Unspecified amblyopia, left eye: Secondary | ICD-10-CM | POA: Diagnosis not present

## 2015-04-04 DIAGNOSIS — D869 Sarcoidosis, unspecified: Secondary | ICD-10-CM | POA: Diagnosis not present

## 2015-04-22 DIAGNOSIS — M545 Low back pain: Secondary | ICD-10-CM | POA: Diagnosis not present

## 2015-04-22 DIAGNOSIS — Z6834 Body mass index (BMI) 34.0-34.9, adult: Secondary | ICD-10-CM | POA: Diagnosis not present

## 2015-04-22 DIAGNOSIS — M542 Cervicalgia: Secondary | ICD-10-CM | POA: Diagnosis not present

## 2015-04-29 ENCOUNTER — Other Ambulatory Visit: Payer: Self-pay | Admitting: Internal Medicine

## 2015-05-08 DIAGNOSIS — Z79899 Other long term (current) drug therapy: Secondary | ICD-10-CM | POA: Diagnosis not present

## 2015-05-08 DIAGNOSIS — D869 Sarcoidosis, unspecified: Secondary | ICD-10-CM | POA: Diagnosis not present

## 2015-05-08 DIAGNOSIS — M79604 Pain in right leg: Secondary | ICD-10-CM | POA: Diagnosis not present

## 2015-05-08 DIAGNOSIS — M79602 Pain in left arm: Secondary | ICD-10-CM | POA: Diagnosis not present

## 2015-05-08 DIAGNOSIS — M65331 Trigger finger, right middle finger: Secondary | ICD-10-CM | POA: Diagnosis not present

## 2015-05-08 DIAGNOSIS — M545 Low back pain: Secondary | ICD-10-CM | POA: Diagnosis not present

## 2015-05-08 DIAGNOSIS — M25512 Pain in left shoulder: Secondary | ICD-10-CM | POA: Diagnosis not present

## 2015-05-19 ENCOUNTER — Encounter (HOSPITAL_COMMUNITY): Payer: Self-pay | Admitting: Emergency Medicine

## 2015-05-19 ENCOUNTER — Emergency Department (HOSPITAL_COMMUNITY)
Admission: EM | Admit: 2015-05-19 | Discharge: 2015-05-19 | Disposition: A | Discharge status: Home / Self Care | Payer: Medicare Other | Attending: Emergency Medicine | Admitting: Emergency Medicine

## 2015-05-19 DIAGNOSIS — H53149 Visual discomfort, unspecified: Secondary | ICD-10-CM | POA: Diagnosis not present

## 2015-05-19 DIAGNOSIS — Z7952 Long term (current) use of systemic steroids: Secondary | ICD-10-CM | POA: Insufficient documentation

## 2015-05-19 DIAGNOSIS — F329 Major depressive disorder, single episode, unspecified: Secondary | ICD-10-CM | POA: Diagnosis not present

## 2015-05-19 DIAGNOSIS — X58XXXA Exposure to other specified factors, initial encounter: Secondary | ICD-10-CM | POA: Diagnosis not present

## 2015-05-19 DIAGNOSIS — Z86718 Personal history of other venous thrombosis and embolism: Secondary | ICD-10-CM | POA: Diagnosis not present

## 2015-05-19 DIAGNOSIS — I10 Essential (primary) hypertension: Secondary | ICD-10-CM | POA: Diagnosis not present

## 2015-05-19 DIAGNOSIS — Z79899 Other long term (current) drug therapy: Secondary | ICD-10-CM | POA: Insufficient documentation

## 2015-05-19 DIAGNOSIS — Z8546 Personal history of malignant neoplasm of prostate: Secondary | ICD-10-CM | POA: Diagnosis not present

## 2015-05-19 DIAGNOSIS — Z87442 Personal history of urinary calculi: Secondary | ICD-10-CM | POA: Diagnosis not present

## 2015-05-19 DIAGNOSIS — Z87891 Personal history of nicotine dependence: Secondary | ICD-10-CM | POA: Insufficient documentation

## 2015-05-19 DIAGNOSIS — E785 Hyperlipidemia, unspecified: Secondary | ICD-10-CM | POA: Insufficient documentation

## 2015-05-19 DIAGNOSIS — G894 Chronic pain syndrome: Secondary | ICD-10-CM | POA: Diagnosis not present

## 2015-05-19 DIAGNOSIS — F419 Anxiety disorder, unspecified: Secondary | ICD-10-CM | POA: Diagnosis not present

## 2015-05-19 DIAGNOSIS — Y999 Unspecified external cause status: Secondary | ICD-10-CM | POA: Insufficient documentation

## 2015-05-19 DIAGNOSIS — M542 Cervicalgia: Secondary | ICD-10-CM | POA: Diagnosis present

## 2015-05-19 DIAGNOSIS — Y939 Activity, unspecified: Secondary | ICD-10-CM | POA: Insufficient documentation

## 2015-05-19 DIAGNOSIS — K219 Gastro-esophageal reflux disease without esophagitis: Secondary | ICD-10-CM | POA: Insufficient documentation

## 2015-05-19 DIAGNOSIS — Y929 Unspecified place or not applicable: Secondary | ICD-10-CM | POA: Insufficient documentation

## 2015-05-19 DIAGNOSIS — S161XXA Strain of muscle, fascia and tendon at neck level, initial encounter: Secondary | ICD-10-CM | POA: Diagnosis not present

## 2015-05-19 DIAGNOSIS — Z8701 Personal history of pneumonia (recurrent): Secondary | ICD-10-CM | POA: Insufficient documentation

## 2015-05-19 MED ORDER — KETOROLAC TROMETHAMINE 30 MG/ML IJ SOLN
30.0000 mg | Freq: Once | INTRAMUSCULAR | Status: AC
  Administered 2015-05-19: 30 mg via INTRAVENOUS
  Filled 2015-05-19: qty 1

## 2015-05-19 MED ORDER — METOCLOPRAMIDE HCL 5 MG/ML IJ SOLN
10.0000 mg | Freq: Once | INTRAMUSCULAR | Status: AC
  Administered 2015-05-19: 10 mg via INTRAVENOUS
  Filled 2015-05-19: qty 2

## 2015-05-19 MED ORDER — OXYCODONE-ACETAMINOPHEN 5-325 MG PO TABS: 1.0000 | ORAL_TABLET | ORAL | Status: DC | PRN

## 2015-05-19 MED ORDER — SODIUM CHLORIDE 0.9 % IV SOLN: 1000.0000 mL | INTRAVENOUS | Status: DC

## 2015-05-19 MED ORDER — TIZANIDINE HCL 2 MG PO TABS: 2.0000 mg | ORAL_TABLET | Freq: Four times a day (QID) | ORAL | Status: DC | PRN

## 2015-05-19 MED ORDER — DIPHENHYDRAMINE HCL 50 MG/ML IJ SOLN
25.0000 mg | Freq: Once | INTRAMUSCULAR | Status: AC
  Administered 2015-05-19: 25 mg via INTRAVENOUS
  Filled 2015-05-19: qty 1

## 2015-05-19 MED ORDER — NAPROXEN 500 MG PO TABS: 500.0000 mg | ORAL_TABLET | Freq: Two times a day (BID) | ORAL | Status: DC

## 2015-05-19 MED ORDER — SODIUM CHLORIDE 0.9 % IV SOLN
1000.0000 mL | Freq: Once | INTRAVENOUS | Status: AC
  Administered 2015-05-19: 1000 mL via INTRAVENOUS

## 2015-05-19 NOTE — Discharge Instructions (Signed)
Cervical Sprain °A cervical sprain is an injury in the neck in which the strong, fibrous tissues (ligaments) that connect your neck bones stretch or tear. Cervical sprains can range from mild to severe. Severe cervical sprains can cause the neck vertebrae to be unstable. This can lead to damage of the spinal cord and can result in serious nervous system problems. The amount of time it takes for a cervical sprain to get better depends on the cause and extent of the injury. Most cervical sprains heal in 1 to 3 weeks. °CAUSES  °Severe cervical sprains may be caused by:  °· Contact sport injuries (such as from football, rugby, wrestling, hockey, auto racing, gymnastics, diving, martial arts, or boxing).   °· Motor vehicle collisions.   °· Whiplash injuries. This is an injury from a sudden forward and backward whipping movement of the head and neck.  °· Falls.   °Mild cervical sprains may be caused by:  °· Being in an awkward position, such as while cradling a telephone between your ear and shoulder.   °· Sitting in a chair that does not offer proper support.   °· Working at a poorly designed computer station.   °· Looking up or down for long periods of time.   °SYMPTOMS  °· Pain, soreness, stiffness, or a burning sensation in the front, back, or sides of the neck. This discomfort may develop immediately after the injury or slowly, 24 hours or more after the injury.   °· Pain or tenderness directly in the middle of the back of the neck.   °· Shoulder or upper back pain.   °· Limited ability to move the neck.   °· Headache.   °· Dizziness.   °· Weakness, numbness, or tingling in the hands or arms.   °· Muscle spasms.   °· Difficulty swallowing or chewing.   °· Tenderness and swelling of the neck.   °DIAGNOSIS  °Most of the time your health care provider can diagnose a cervical sprain by taking your history and doing a physical exam. Your health care provider will ask about previous neck injuries and any known neck  problems, such as arthritis in the neck. X-rays may be taken to find out if there are any other problems, such as with the bones of the neck. Other tests, such as a CT scan or MRI, may also be needed.  °TREATMENT  °Treatment depends on the severity of the cervical sprain. Mild sprains can be treated with rest, keeping the neck in place (immobilization), and pain medicines. Severe cervical sprains are immediately immobilized. Further treatment is done to help with pain, muscle spasms, and other symptoms and may include: °· Medicines, such as pain relievers, numbing medicines, or muscle relaxants.   °· Physical therapy. This may involve stretching exercises, strengthening exercises, and posture training. Exercises and improved posture can help stabilize the neck, strengthen muscles, and help stop symptoms from returning.   °HOME CARE INSTRUCTIONS  °· Put ice on the injured area.   °¨ Put ice in a plastic bag.   °¨ Place a towel between your skin and the bag.   °¨ Leave the ice on for 15-20 minutes, 3-4 times a day.   °· If your injury was severe, you may have been given a cervical collar to wear. A cervical collar is a two-piece collar designed to keep your neck from moving while it heals. °¨ Do not remove the collar unless instructed by your health care provider. °¨ If you have long hair, keep it outside of the collar. °¨ Ask your health care provider before making any adjustments to your collar. Minor   adjustments may be required over time to improve comfort and reduce pressure on your chin or on the back of your head.  Ifyou are allowed to remove the collar for cleaning or bathing, follow your health care provider's instructions on how to do so safely.  Keep your collar clean by wiping it with mild soap and water and drying it completely. If the collar you have been given includes removable pads, remove them every 1-2 days and hand wash them with soap and water. Allow them to air dry. They should be completely  dry before you wear them in the collar.  If you are allowed to remove the collar for cleaning and bathing, wash and dry the skin of your neck. Check your skin for irritation or sores. If you see any, tell your health care provider.  Do not drive while wearing the collar.   Only take over-the-counter or prescription medicines for pain, discomfort, or fever as directed by your health care provider.   Keep all follow-up appointments as directed by your health care provider.   Keep all physical therapy appointments as directed by your health care provider.   Make any needed adjustments to your workstation to promote good posture.   Avoid positions and activities that make your symptoms worse.   Warm up and stretch before being active to help prevent problems.  SEEK MEDICAL CARE IF:   Your pain is not controlled with medicine.   You are unable to decrease your pain medicine over time as planned.   Your activity level is not improving as expected.  SEEK IMMEDIATE MEDICAL CARE IF:   You develop any bleeding.  You develop stomach upset.  You have signs of an allergic reaction to your medicine.   Your symptoms get worse.   You develop new, unexplained symptoms.   You have numbness, tingling, weakness, or paralysis in any part of your body.  MAKE SURE YOU:   Understand these instructions.  Will watch your condition.  Will get help right away if you are not doing well or get worse. Document Released: 06/20/2007 Document Revised: 08/28/2013 Document Reviewed: 02/28/2013 [facility] Patient Information 2015 [place], [place]. This information is not intended to replace advice given to you by your health care provider. Make sure you discuss any questions you have with your health care provider.  Naproxen and naproxen sodium oral immediate-release tablets What is this medicine? NAPROXEN (na PROX en) is a non-steroidal anti-inflammatory drug (NSAID). It is used to reduce  swelling and to treat pain. This medicine may be used for dental pain, headache, or painful monthly periods. It is also used for painful joint and muscular problems such as arthritis, tendinitis, bursitis, and gout. This medicine may be used for other purposes; ask your health care provider or pharmacist if you have questions. COMMON BRAND NAME(S): Aflaxen, Aleve, Aleve Arthritis, All Day Relief, Anaprox, Anaprox DS, Naprosyn What should I tell my health care provider before I take this medicine? They need to know if you have any of these conditions: -asthma -cigarette smoker -drink more than 3 alcohol containing drinks a day -heart disease or circulation problems such as heart failure or leg edema (fluid retention) -high blood pressure -kidney disease -liver disease -stomach bleeding or ulcers -an unusual or allergic reaction to naproxen, aspirin, other NSAIDs, other medicines, foods, dyes, or preservatives -pregnant or trying to get pregnant -breast-feeding How should I use this medicine? Take this medicine by mouth with a glass of water. Follow the directions on  the prescription label. Take it with food if your stomach gets upset. Try to not lie down for at least 10 minutes after you take it. Take your medicine at regular intervals. Do not take your medicine more often than directed. Long-term, continuous use may increase the risk of heart attack or stroke. A special MedGuide will be given to you by the pharmacist with each prescription and refill. Be sure to read this information carefully each time. Talk to your pediatrician regarding the use of this medicine in children. Special care may be needed. Overdosage: If you think you have taken too much of this medicine contact a poison control center or emergency room at once. NOTE: This medicine is only for you. Do not share this medicine with others. What if I miss a dose? If you miss a dose, take it as soon as you can. If it is almost time  for your next dose, take only that dose. Do not take double or extra doses. What may interact with this medicine? -alcohol -aspirin -cidofovir -diuretics -lithium -methotrexate -other drugs for inflammation like ketorolac or prednisone -pemetrexed -probenecid -warfarin This list may not describe all possible interactions. Give your health care provider a list of all the medicines, herbs, non-prescription drugs, or dietary supplements you use. Also tell them if you smoke, drink alcohol, or use illegal drugs. Some items may interact with your medicine. What should I watch for while using this medicine? Tell your doctor or health care professional if your pain does not get better. Talk to your doctor before taking another medicine for pain. Do not treat yourself. This medicine does not prevent heart attack or stroke. In fact, this medicine may increase the chance of a heart attack or stroke. The chance may increase with longer use of this medicine and in people who have heart disease. If you take aspirin to prevent heart attack or stroke, talk with your doctor or health care professional. Do not take other medicines that contain aspirin, ibuprofen, or naproxen with this medicine. Side effects such as stomach upset, nausea, or ulcers may be more likely to occur. Many medicines available without a prescription should not be taken with this medicine. This medicine can cause ulcers and bleeding in the stomach and intestines at any time during treatment. Do not smoke cigarettes or drink alcohol. These increase irritation to your stomach and can make it more susceptible to damage from this medicine. Ulcers and bleeding can happen without warning symptoms and can cause death. You may get drowsy or dizzy. Do not drive, use machinery, or do anything that needs mental alertness until you know how this medicine affects you. Do not stand or sit up quickly, especially if you are an older patient. This reduces the  risk of dizzy or fainting spells. This medicine can cause you to bleed more easily. Try to avoid damage to your teeth and gums when you brush or floss your teeth. What side effects may I notice from receiving this medicine? Side effects that you should report to your doctor or health care professional as soon as possible: -black or bloody stools, blood in the urine or vomit -blurred vision -chest pain -difficulty breathing or wheezing -nausea or vomiting -severe stomach pain -skin rash, skin redness, blistering or peeling skin, hives, or itching -slurred speech or weakness on one side of the body -swelling of eyelids, throat, lips -unexplained weight gain or swelling -unusually weak or tired -yellowing of eyes or skin Side effects that usually do  not require medical attention (report to your doctor or health care professional if they continue or are bothersome): -constipation -headache -heartburn This list may not describe all possible side effects. Call your doctor for medical advice about side effects. You may report side effects to FDA at 1-800-FDA-1088. Where should I keep my medicine? Keep out of the reach of children. Store at room temperature between 15 and 30 degrees C (59 and 86 degrees F). Keep container tightly closed. Throw away any unused medicine after the expiration date. NOTE: This sheet is a summary. It may not cover all possible information. If you have questions about this medicine, talk to your doctor, pharmacist, or health care provider.  2015, [employer]vier/[employer] Standard. (2009-08-25 20:10:16)  Tizanidine tablets or capsules What is this medicine? TIZANIDINE (tye ZAN i deen) helps to relieve muscle spasms. It may be used to help in the treatment of multiple sclerosis and spinal cord injury. This medicine may be used for other purposes; ask your health care provider or pharmacist if you have questions. COMMON BRAND NAME(S): Zanaflex What should I tell my health care  provider before I take this medicine? They need to know if you have any of these conditions: -kidney disease -liver disease -low blood pressure -mental disorder -an unusual or allergic reaction to tizanidine, other medicines, lactose (tablets only), foods, dyes, or preservatives -pregnant or trying to get pregnant -breast-feeding How should I use this medicine? Take this medicine by mouth with a full glass of water. Take this medicine on an empty stomach, at least 30 minutes before or 2 hours after food. Do not take with food unless you talk with your doctor. Follow the directions on the prescription label. Take your medicine at regular intervals. Do not take your medicine more often than directed. Do not stop taking except on your doctor's advice. Suddenly stopping the medicine can be very dangerous. Talk to your pediatrician regarding the use of this medicine in children. Patients over 45 years old may have a stronger reaction and need a smaller dose. Overdosage: If you think you have taken too much of this medicine contact a poison control center or emergency room at once. NOTE: This medicine is only for you. Do not share this medicine with others. What if I miss a dose? If you miss a dose, take it as soon as you can. If it is almost time for your next dose, take only that dose. Do not take double or extra doses. What may interact with this medicine? Do not take this medicine with any of the following medications: -ciprofloxacin -clonidine -fluvoxamine -guanabenz -guanfacine -methyldopa This medicine may also interact with the following medications: -acyclovir -alcohol -antihistamines -baclofen -barbiturates like phenobarbital -benzodiazepines -cimetidine -famotidine -male hormones, like estrogens or progestins and birth control pills -medicines for high blood pressure -medicines for irregular heartbeat -medicines for pain like codeine, morphine, and hydrocodone -medicines  for sleep -rofecoxib -some antibiotics like levofloxacin, ofloxacin -ticlopidine -zileuton This list may not describe all possible interactions. Give your health care provider a list of all the medicines, herbs, non-prescription drugs, or dietary supplements you use. Also tell them if you smoke, drink alcohol, or use illegal drugs. Some items may interact with your medicine. What should I watch for while using this medicine? You may get drowsy or dizzy. Do not drive, use machinery, or do anything that needs mental alertness until you know how this medicine affects you. Do not stand or sit up quickly, especially if you are an older patient.  This reduces the risk of dizzy or fainting spells. Alcohol may interfere with the effect of this medicine. Avoid alcoholic drinks. Your mouth may get dry. Chewing sugarless gum or sucking hard candy, and drinking plenty of water may help. Contact your doctor if the problem does not go away or is severe. What side effects may I notice from receiving this medicine? Side effects that you should report to your doctor or health care professional as soon as possible: -allergic reactions like skin rash, itching or hives, swelling of the face, lips, or tongue -blurred vision -fainting spells -hallucinations -nausea or vomiting -nervousness -redness, blistering, peeling or loosening of the skin, including inside the mouth -slow or irregular heartbeat, palpitations, or chest pain -yellowing of the skin or eyes Side effects that usually do not require medical attention (report to your doctor or health care professional if they continue or are bothersome): -dizziness -drowsiness -dry mouth -tiredness or weakness This list may not describe all possible side effects. Call your doctor for medical advice about side effects. You may report side effects to FDA at 1-800-FDA-1088. Where should I keep my medicine? Keep out of the reach of children. Store at room temperature  between 15 and 30 degrees C (59 and 86 degrees F). Throw away any unused medicine after the expiration date. NOTE: This sheet is a summary. It may not cover all possible information. If you have questions about this medicine, talk to your doctor, pharmacist, or health care provider.  2015, [employer]vier/Gold Standard. (2008-05-09 12:38:02)  Acetaminophen; Oxycodone tablets What is this medicine? ACETAMINOPHEN; OXYCODONE (a set a MEE noe fen; ox i KOE done) is a pain reliever. It is used to treat mild to moderate pain. This medicine may be used for other purposes; ask your health care provider or pharmacist if you have questions. COMMON BRAND NAME(S): Endocet, Magnacet, Narvox, Percocet, Perloxx, Primalev, Primlev, Roxicet, Xolox What should I tell my health care provider before I take this medicine? They need to know if you have any of these conditions: -brain tumor -Crohn's disease, inflammatory bowel disease, or ulcerative colitis -drug abuse or addiction -head injury -heart or circulation problems -if you often drink alcohol -kidney disease or problems going to the bathroom -liver disease -lung disease, asthma, or breathing problems -an unusual or allergic reaction to acetaminophen, oxycodone, other opioid analgesics, other medicines, foods, dyes, or preservatives -pregnant or trying to get pregnant -breast-feeding How should I use this medicine? Take this medicine by mouth with a full glass of water. Follow the directions on the prescription label. Take your medicine at regular intervals. Do not take your medicine more often than directed. Talk to your pediatrician regarding the use of this medicine in children. Special care may be needed. Patients over 22 years old may have a stronger reaction and need a smaller dose. Overdosage: If you think you have taken too much of this medicine contact a poison control center or emergency room at once. NOTE: This medicine is only for you. Do not  share this medicine with others. What if I miss a dose? If you miss a dose, take it as soon as you can. If it is almost time for your next dose, take only that dose. Do not take double or extra doses. What may interact with this medicine? -alcohol -antihistamines -barbiturates like amobarbital, butalbital, butabarbital, methohexital, pentobarbital, phenobarbital, thiopental, and secobarbital -benztropine -drugs for bladder problems like solifenacin, trospium, oxybutynin, tolterodine, hyoscyamine, and methscopolamine -drugs for breathing problems like ipratropium and tiotropium -drugs  for certain stomach or intestine problems like propantheline, homatropine methylbromide, glycopyrrolate, atropine, belladonna, and dicyclomine -general anesthetics like etomidate, ketamine, nitrous oxide, propofol, desflurane, enflurane, halothane, isoflurane, and sevoflurane -medicines for depression, anxiety, or psychotic disturbances -medicines for sleep -muscle relaxants -naltrexone -narcotic medicines (opiates) for pain -phenothiazines like perphenazine, thioridazine, chlorpromazine, mesoridazine, fluphenazine, prochlorperazine, promazine, and trifluoperazine -scopolamine -tramadol -trihexyphenidyl This list may not describe all possible interactions. Give your health care provider a list of all the medicines, herbs, non-prescription drugs, or dietary supplements you use. Also tell them if you smoke, drink alcohol, or use illegal drugs. Some items may interact with your medicine. What should I watch for while using this medicine? Tell your doctor or health care professional if your pain does not go away, if it gets worse, or if you have new or a different type of pain. You may develop tolerance to the medicine. Tolerance means that you will need a higher dose of the medication for pain relief. Tolerance is normal and is expected if you take this medicine for a long time. Do not suddenly stop taking your  medicine because you may develop a severe reaction. Your body becomes used to the medicine. This does NOT mean you are addicted. Addiction is a behavior related to getting and using a drug for a non-medical reason. If you have pain, you have a medical reason to take pain medicine. Your doctor will tell you how much medicine to take. If your doctor wants you to stop the medicine, the dose will be slowly lowered over time to avoid any side effects. You may get drowsy or dizzy. Do not drive, use machinery, or do anything that needs mental alertness until you know how this medicine affects you. Do not stand or sit up quickly, especially if you are an older patient. This reduces the risk of dizzy or fainting spells. Alcohol may interfere with the effect of this medicine. Avoid alcoholic drinks. There are different types of narcotic medicines (opiates) for pain. If you take more than one type at the same time, you may have more side effects. Give your health care provider a list of all medicines you use. Your doctor will tell you how much medicine to take. Do not take more medicine than directed. Call emergency for help if you have problems breathing. The medicine will cause constipation. Try to have a bowel movement at least every 2 to 3 days. If you do not have a bowel movement for 3 days, call your doctor or health care professional. Do not take Tylenol (acetaminophen) or medicines that have acetaminophen with this medicine. Too much acetaminophen can be very dangerous. Many nonprescription medicines contain acetaminophen. Always read the labels carefully to avoid taking more acetaminophen. What side effects may I notice from receiving this medicine? Side effects that you should report to your doctor or health care professional as soon as possible: -allergic reactions like skin rash, itching or hives, swelling of the face, lips, or tongue -breathing difficulties, wheezing -confusion -light headedness or  fainting spells -severe stomach pain -unusually weak or tired -yellowing of the skin or the whites of the eyes Side effects that usually do not require medical attention (report to your doctor or health care professional if they continue or are bothersome): -dizziness -drowsiness -nausea -vomiting This list may not describe all possible side effects. Call your doctor for medical advice about side effects. You may report side effects to FDA at 1-800-FDA-1088. Where should I keep my medicine? Keep out of  the reach of children. This medicine can be abused. Keep your medicine in a safe place to protect it from theft. Do not share this medicine with anyone. Selling or giving away this medicine is dangerous and against the law. Store at room temperature between 20 and 25 degrees C (68 and 77 degrees F). Keep container tightly closed. Protect from light. This medicine may cause accidental overdose and death if it is taken by other adults, children, or pets. Flush any unused medicine down the toilet to reduce the chance of harm. Do not use the medicine after the expiration date. NOTE: This sheet is a summary. It may not cover all possible information. If you have questions about this medicine, talk to your doctor, pharmacist, or health care provider.  2015, [employer] Standard. (2013-04-16 13:17:35)

## 2015-05-19 NOTE — ED Notes (Signed)
EKG performed and given to Dr. Roxanne Mins d/t jaw pain.

## 2015-05-19 NOTE — ED Provider Notes (Signed)
CSN: [id number]     Arrival date & time 05/19/15  0247 History   This chart was scribed for Delora Fuel, MD by Forrestine Him, ED Scribe. This patient was seen in room B16C/B16C and the patient's care was started 3:28 AM.   Chief Complaint  Patient presents with  . Neck Pain   The history is provided by the patient. No language interpreter was used.    HPI Comments: [name]. is a 49 y.o. male with a PMHx of hyperlipidemia, HTN, and peripheral vascular disease who presents to the Emergency Department complaining of constant, ongoing R sided neck pain that radiates into the head onset 3-4 hours prior to arrival. Pain is currently rated 8/10. No recent injury or trauma to the neck. Pain is made worse with certain movements and mildly alleviated when at rest. Mild HA also reported. Heat and ice application attempted prior to arrival without any improvement. Denies any recent fever, chills, nausea, vomiting, chest pain, or shortness of breath. No weakness, loss of sensation, or numbness. [name] denies any history of same. No known allergies to medications.  Past Medical History  Diagnosis Date  . B12 DEFICIENCY 03/06/2007  . CARDIOMYOPATHY, IDIOPATHIC HYPERTROPHIC 03/06/2007  . DEPRESSION 03/06/2007  . GERD 06/01/2007  . HYPERCHOLESTEROLEMIA 03/06/2007  . HYPERLIPIDEMIA 11/08/2008  . IMPAIRED GLUCOSE TOLERANCE 11/08/2008  . NEPHROLITHIASIS, HX OF 04/07/2010  . 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 OSTEOARTHRITIS 06/01/2007  . Palpitations 10/23/2007  . SYNDROME, CHRONIC PAIN 03/06/2007  . Cancer     prostate ca  . Prostate cancer   . HYPERTENSION 03/06/2007    [name]  . Pneumonia     hx of  . Dysrhythmia     sees [name] for "palpitations"  . DVT (deep venous thrombosis)   . Anxiety   . Peripheral vascular disease 03    dvt  . SLEEP APNEA 03/06/2007    uses vpap, last sleep study 2011, Dr. Alanson Puls, at [facility] of breath     infreq  . History of kidney stones    . Anginal pain     s/p cardiac cath 07/19/13 showing NL coronaries, EF 50-55%   Past Surgical History  Procedure Laterality Date  . Decompression and fusion      cervicothoracic  . Partial nephrectomy Left     bx  . Cardiac catheterization    . Lumbar fusion    . Prostate surgery      robotic prostatectomy  . Penile prosthesis implant    . Tonsillectomy    . Leg skin lesion  biopsy / excision      left leg  . Eye surgery Bilateral     blepharoplasty  . Spinal cord stimulator insertion N/A 04/11/2014    Procedure: LUMBAR SPINAL CORD STIMULATOR INSERTION;  Surgeon: Newman Pies, MD;  Location: [place] NEUR[facility] ORS;  Service: Neurosurgery;  Laterality: N/A;  . Left heart catheterization with coronary angiogram N/A 07/19/2013    Procedure: LEFT HEART CATHETERIZATION WITH CORONARY ANGIOGRAM;  Surgeon: Jolaine Artist, MD;  Location: [facility] CATH LAB;  Service: Cardiovascular;  Laterality: N/A;   Family History  Problem Relation Age of Onset  . Heart disease Neg Hx     family hx CHF  . Kidney disease Neg Hx     family hx   . Cancer Neg Hx     family hx prostate   Social History  Substance Use Topics  . Smoking status: Former Smoker -- 0.50 packs/day  for 3 years    Types: Cigarettes    Quit date: 09/07/1983  . Smokeless tobacco: Never Used  . Alcohol Use: No    Review of Systems  Constitutional: Negative for fever and chills.  Eyes: Positive for photophobia.  Respiratory: Negative for cough and shortness of breath.   Cardiovascular: Negative for chest pain.  Gastrointestinal: Negative for nausea, vomiting and abdominal pain.  Musculoskeletal: Positive for neck pain.  Neurological: Positive for headaches.  Psychiatric/Behavioral: Negative for confusion.  All other systems reviewed and are negative.     Allergies  Adhesive and Pollen extract  Home Medications   Prior to Admission medications   Medication Sig Start Date End Date Taking? Authorizing Provider  albuterol  (PROVENTIL HFA;VENTOLIN HFA) 108 (90 BASE) MCG/ACT inhaler Inhale 2 puffs into the lungs 2 (two) times daily as needed for wheezing or shortness of breath.     Historical Provider, MD  amitriptyline (ELAVIL) 25 MG tablet Take 1 tablet (25 mg total) by mouth at bedtime. 09/12/12   Marletta Lor, MD  amitriptyline (ELAVIL) 25 MG tablet TAKE 1 TABLET (25 MG TOTAL) BY MOUTH AT BEDTIME. 12/06/14   Marletta Lor, MD  atenolol (TENORMIN) 25 MG tablet TAKE 1 TABLET BY MOUTH TWICE DAILY 01/16/15   Marletta Lor, MD  atorvastatin (LIPITOR) 40 MG tablet Take 40 mg by mouth daily.    Historical Provider, MD  azaTHIOprine (IMURAN) 50 MG tablet TAKE 4 TABLETS (200 MG TOTAL) BY MOUTH DAILY. 02/14/15   Historical Provider, MD  buPROPion (WELLBUTRIN XL) 300 MG 24 hr tablet TAKE ONE TABLET BY MOUTH EVERY MORNING 01/16/15   Marletta Lor, MD  cyanocobalamin (,VITAMIN B-12,) 1000 MCG/ML injection Inject 1,000 mcg into the muscle every 30 (thirty) days. On or about the 1st of August    Historical Provider, MD  cyclobenzaprine (FLEXERIL) 10 MG tablet Take 10 mg by mouth. 06/06/13   Historical Provider, MD  cycloSPORINE (RESTASIS) 0.05 % ophthalmic emulsion Place 2 drops into both eyes 2 (two) times daily.     Historical Provider, MD  dicyclomine (BENTYL) 10 MG capsule Take 10 mg by mouth 4 (four) times daily.    Historical Provider, MD  docusate sodium (COLACE) 100 MG capsule Take 100 mg by mouth at bedtime.     Historical Provider, MD  DULoxetine (CYMBALTA) 30 MG capsule TAKE ONE CAPSULE BY MOUTH DAILY 04/29/15   Marletta Lor, MD  erythromycin ophthalmic ointment Place 1 application into both eyes at bedtime.  04/15/14   Historical Provider, MD  esomeprazole (NEXIUM) 40 MG capsule Take 40 mg by mouth daily at 12 noon.    Historical Provider, MD  gabapentin (NEURONTIN) 300 MG capsule Take 900 mg by mouth 4 (four) times daily.     Historical Provider, MD  gabapentin (NEURONTIN) 300 MG capsule TAKE 3  CAPSULES BY MOUTH FOUR TIMES DAILY 12/06/14   Marletta Lor, MD  guaiFENesin (MUCINEX) 600 MG 12 hr tablet Take 600 mg by mouth 2 (two) times daily.     Historical Provider, MD  hypromellose (SYSTANE OVERNIGHT THERAPY) 0.3 % GEL ophthalmic ointment Apply to eye.    Historical Provider, MD  ibuprofen (ADVIL,MOTRIN) 600 MG tablet Take 1 tablet (600 mg total) by mouth every 6 (six) hours as needed. 04/19/14   Varney Biles, MD  NEXIUM 40 MG capsule TAKE ONE CAPSULE BY MOUTH DAILY 10/09/14   Marletta Lor, MD  omeprazole (PRILOSEC) 20 MG capsule Take 1 capsule (20 mg  total) by mouth daily. 10/15/14   Jola Schmidt, MD  oxybutynin (DITROPAN-XL) 5 MG 24 hr tablet Take 5 mg by mouth. 01/30/15   Historical Provider, MD  oxyCODONE-acetaminophen (PERCOCET) 10-325 MG per tablet Take 1 tablet by mouth every 4 (four) hours as needed for pain. Prescribed by Dr. Assunta Curtis 04/12/14   Newman Pies, MD  Polyethyl Glycol-Propyl Glycol (SYSTANE PRESERVATIVE FREE OP) Place 1 drop into both eyes 2 (two) times daily.    Historical Provider, MD  predniSONE (DELTASONE) 5 MG tablet Take 5 mg by mouth daily. Continuous course    Historical Provider, MD  promethazine (PHENERGAN) 25 MG tablet Take 25 mg by mouth every 6 (six) hours as needed for nausea.     Historical Provider, MD  traMADol (ULTRAM) 50 MG tablet Take 50 mg by mouth daily as needed for moderate pain.     Historical Provider, MD  triamcinolone (NASACORT AQ) 55 MCG/ACT AERO nasal inhaler Place 1 spray into both nostrils daily as needed (dry mouth).     Historical Provider, MD  triamcinolone ointment (KENALOG) 0.1 % Apply 1 application topically 2 (two) times daily.    Historical Provider, MD   Triage Vitals: BP 133/83 mmHg  Pulse 76  Temp(Src) 98.2 F (36.8 C) (Oral)  Resp 18  Ht 5\' 9"  (1.753 m)  Wt 232 lb (105.235 kg)  BMI 34.24 kg/m2  SpO2 100%   Physical Exam  Constitutional: He is oriented to person, place, and time. He appears well-developed and  well-nourished.  HENT:  Head: Normocephalic and atraumatic.  Eyes: EOM are normal. Pupils are equal, round, and reactive to light.  Neck: No JVD present.  Spasm of R paracervical  Muscles with localized tenderness   Cardiovascular: Normal rate, regular rhythm, normal heart sounds and intact distal pulses.   No murmur heard. Pulmonary/Chest: Effort normal and breath sounds normal. He has no wheezes. He has no rales. He exhibits no tenderness.  Abdominal: Soft. Bowel sounds are normal. He exhibits no distension and no mass. There is no tenderness.  Musculoskeletal: Normal range of motion. He exhibits no edema.  Lymphadenopathy:    He has no cervical adenopathy.  Neurological: He is alert and oriented to person, place, and time. No cranial nerve deficit. He exhibits normal muscle tone. Coordination normal.  Skin: Skin is warm and dry. No rash noted.  Psychiatric: He has a normal mood and affect. His behavior is normal. Judgment and thought content normal.  Nursing note and vitals reviewed.   ED Course  Procedures (including critical care time)  DIAGNOSTIC STUDIES: Oxygen Saturation is 100% on RA, Normal by my interpretation.    COORDINATION OF CARE:      EKG Interpretation   Date/Time:  Monday May 19 2015 03:19:50 EDT Ventricular Rate:  71 PR Interval:  182 QRS Duration: 85 QT Interval:  357 QTC Calculation: 388 R Axis:   0 Text Interpretation:  Sinus rhythm Probable left atrial enlargement  Anteroseptal infarct, old When compared with ECG of 10/15/2014, Premature  ventricular complexes are no longer Present Confirmed by [facility]  MD, Rashaun Wichert  ([id number]) on 05/19/2015 3:25:34 AM      MDM   Final diagnoses:  Cervical muscle strain, initial encounter    Neck pain with radiation to the head. Pain and seems to be centered in the right paracervical muscles headache seems to be a muscle contraction headache. No neurologic deficit and no midline tenderness. He is given a headache  cocktail of IV fluids, metoclopramide, and diphenhydramine  with partial relief. He was given ketorolac and lorazepam with excellent relief of pain. He is discharged with prescriptions for tizanidine and oxycodone-acetaminophen and naproxen. Follow-up with PCP.  I personally performed the services described in this documentation, which was scribed in my presence. The recorded information has been reviewed and is accurate.     Delora Fuel, MD 76/14/70 [id number]

## 2015-05-19 NOTE — ED Notes (Signed)
Patient left at this time with all belongings. 

## 2015-05-19 NOTE — ED Notes (Signed)
Pt. reports right side neck pain onset last night , pain increases with movement and changing positions , denies injury , pt. added mild headache .

## 2015-05-30 DIAGNOSIS — G4733 Obstructive sleep apnea (adult) (pediatric): Secondary | ICD-10-CM | POA: Diagnosis not present

## 2015-05-30 DIAGNOSIS — E6609 Other obesity due to excess calories: Secondary | ICD-10-CM | POA: Diagnosis not present

## 2015-05-30 DIAGNOSIS — Z6834 Body mass index (BMI) 34.0-34.9, adult: Secondary | ICD-10-CM | POA: Diagnosis not present

## 2015-06-09 ENCOUNTER — Other Ambulatory Visit: Payer: Self-pay | Admitting: Internal Medicine

## 2015-07-01 DIAGNOSIS — M461 Sacroiliitis, not elsewhere classified: Secondary | ICD-10-CM | POA: Diagnosis not present

## 2015-07-27 ENCOUNTER — Other Ambulatory Visit: Payer: Self-pay | Admitting: Internal Medicine

## 2015-09-02 DIAGNOSIS — N200 Calculus of kidney: Secondary | ICD-10-CM | POA: Diagnosis not present

## 2015-09-02 DIAGNOSIS — C642 Malignant neoplasm of left kidney, except renal pelvis: Secondary | ICD-10-CM | POA: Diagnosis not present

## 2015-09-02 DIAGNOSIS — C61 Malignant neoplasm of prostate: Secondary | ICD-10-CM | POA: Diagnosis not present

## 2015-09-02 DIAGNOSIS — N5231 Erectile dysfunction following radical prostatectomy: Secondary | ICD-10-CM | POA: Diagnosis not present

## 2015-09-02 DIAGNOSIS — R399 Unspecified symptoms and signs involving the genitourinary system: Secondary | ICD-10-CM | POA: Diagnosis not present

## 2015-09-12 DIAGNOSIS — G4733 Obstructive sleep apnea (adult) (pediatric): Secondary | ICD-10-CM | POA: Diagnosis not present

## 2015-09-24 DIAGNOSIS — Z6835 Body mass index (BMI) 35.0-35.9, adult: Secondary | ICD-10-CM | POA: Diagnosis not present

## 2015-09-24 DIAGNOSIS — M461 Sacroiliitis, not elsewhere classified: Secondary | ICD-10-CM | POA: Diagnosis not present

## 2015-11-07 ENCOUNTER — Other Ambulatory Visit: Payer: Self-pay | Admitting: Internal Medicine

## 2015-11-30 ENCOUNTER — Encounter (HOSPITAL_COMMUNITY): Payer: Self-pay | Admitting: Physical Medicine and Rehabilitation

## 2015-11-30 ENCOUNTER — Emergency Department (HOSPITAL_COMMUNITY): Payer: BC Managed Care – PPO

## 2015-11-30 ENCOUNTER — Emergency Department (HOSPITAL_COMMUNITY)
Admission: EM | Admit: 2015-11-30 | Discharge: 2015-11-30 | Disposition: A | Discharge status: Home / Self Care | Payer: BC Managed Care – PPO | Attending: Emergency Medicine | Admitting: Emergency Medicine

## 2015-11-30 DIAGNOSIS — I10 Essential (primary) hypertension: Secondary | ICD-10-CM | POA: Diagnosis not present

## 2015-11-30 DIAGNOSIS — F329 Major depressive disorder, single episode, unspecified: Secondary | ICD-10-CM | POA: Insufficient documentation

## 2015-11-30 DIAGNOSIS — Z862 Personal history of diseases of the blood and blood-forming organs and certain disorders involving the immune mechanism: Secondary | ICD-10-CM | POA: Diagnosis not present

## 2015-11-30 DIAGNOSIS — Z8546 Personal history of malignant neoplasm of prostate: Secondary | ICD-10-CM | POA: Diagnosis not present

## 2015-11-30 DIAGNOSIS — Z8701 Personal history of pneumonia (recurrent): Secondary | ICD-10-CM | POA: Diagnosis not present

## 2015-11-30 DIAGNOSIS — R002 Palpitations: Secondary | ICD-10-CM | POA: Diagnosis not present

## 2015-11-30 DIAGNOSIS — Z87891 Personal history of nicotine dependence: Secondary | ICD-10-CM | POA: Insufficient documentation

## 2015-11-30 DIAGNOSIS — G4730 Sleep apnea, unspecified: Secondary | ICD-10-CM | POA: Insufficient documentation

## 2015-11-30 DIAGNOSIS — Z87442 Personal history of urinary calculi: Secondary | ICD-10-CM | POA: Insufficient documentation

## 2015-11-30 DIAGNOSIS — Z9889 Other specified postprocedural states: Secondary | ICD-10-CM | POA: Insufficient documentation

## 2015-11-30 DIAGNOSIS — R0602 Shortness of breath: Secondary | ICD-10-CM | POA: Insufficient documentation

## 2015-11-30 DIAGNOSIS — G894 Chronic pain syndrome: Secondary | ICD-10-CM | POA: Diagnosis not present

## 2015-11-30 DIAGNOSIS — F419 Anxiety disorder, unspecified: Secondary | ICD-10-CM | POA: Insufficient documentation

## 2015-11-30 DIAGNOSIS — K219 Gastro-esophageal reflux disease without esophagitis: Secondary | ICD-10-CM | POA: Diagnosis not present

## 2015-11-30 DIAGNOSIS — E7800 Pure hypercholesterolemia, unspecified: Secondary | ICD-10-CM | POA: Insufficient documentation

## 2015-11-30 DIAGNOSIS — Z79899 Other long term (current) drug therapy: Secondary | ICD-10-CM | POA: Diagnosis not present

## 2015-11-30 DIAGNOSIS — R079 Chest pain, unspecified: Secondary | ICD-10-CM | POA: Diagnosis present

## 2015-11-30 DIAGNOSIS — Z7952 Long term (current) use of systemic steroids: Secondary | ICD-10-CM | POA: Insufficient documentation

## 2015-11-30 DIAGNOSIS — Z792 Long term (current) use of antibiotics: Secondary | ICD-10-CM | POA: Diagnosis not present

## 2015-11-30 DIAGNOSIS — Z9981 Dependence on supplemental oxygen: Secondary | ICD-10-CM | POA: Diagnosis not present

## 2015-11-30 DIAGNOSIS — E785 Hyperlipidemia, unspecified: Secondary | ICD-10-CM | POA: Diagnosis not present

## 2015-11-30 DIAGNOSIS — Z86718 Personal history of other venous thrombosis and embolism: Secondary | ICD-10-CM | POA: Diagnosis not present

## 2015-11-30 DIAGNOSIS — M1990 Unspecified osteoarthritis, unspecified site: Secondary | ICD-10-CM | POA: Insufficient documentation

## 2015-11-30 DIAGNOSIS — I209 Angina pectoris, unspecified: Secondary | ICD-10-CM | POA: Diagnosis not present

## 2015-11-30 DIAGNOSIS — R0789 Other chest pain: Secondary | ICD-10-CM | POA: Diagnosis not present

## 2015-11-30 LAB — CBC WITH DIFFERENTIAL/PLATELET
Basophils Absolute: 0 10*3/uL (ref 0.0–0.1)
Basophils Relative: 1 %
Eosinophils Absolute: 0.1 10*3/uL (ref 0.0–0.7)
Eosinophils Relative: 4 %
HCT: 43.6 % (ref 39.0–52.0)
Hemoglobin: 14.6 g/dL (ref 13.0–17.0)
Lymphocytes Relative: 25 %
Lymphs Abs: 0.7 10*3/uL (ref 0.7–4.0)
MCH: 29.4 pg (ref 26.0–34.0)
MCHC: 33.5 g/dL (ref 30.0–36.0)
MCV: 87.9 fL (ref 78.0–100.0)
Monocytes Absolute: 0.7 10*3/uL (ref 0.1–1.0)
Monocytes Relative: 24 %
Neutro Abs: 1.3 10*3/uL — ABNORMAL LOW (ref 1.7–7.7)
Neutrophils Relative %: 46 %
Platelets: 321 10*3/uL (ref 150–400)
RBC: 4.96 MIL/uL (ref 4.22–5.81)
RDW: 14 % (ref 11.5–15.5)
WBC: 2.9 10*3/uL — ABNORMAL LOW (ref 4.0–10.5)

## 2015-11-30 LAB — COMPREHENSIVE METABOLIC PANEL
ALT: 24 U/L (ref 17–63)
AST: 29 U/L (ref 15–41)
Albumin: 4.3 g/dL (ref 3.5–5.0)
Alkaline Phosphatase: 45 U/L (ref 38–126)
Anion gap: 7 (ref 5–15)
BUN: 14 mg/dL (ref 6–20)
CO2: 25 mmol/L (ref 22–32)
Calcium: 9.5 mg/dL (ref 8.9–10.3)
Chloride: 107 mmol/L (ref 101–111)
Creatinine, Ser: 1.07 mg/dL (ref 0.61–1.24)
GFR calc Af Amer: >60 mL/min (ref >60)
GFR calc non Af Amer: >60 mL/min (ref >60)
Glucose, Bld: 100 mg/dL — ABNORMAL HIGH (ref 65–99)
Potassium: 4.2 mmol/L (ref 3.5–5.1)
Sodium: 139 mmol/L (ref 135–145)
Total Bilirubin: 0.7 mg/dL (ref 0.3–1.2)
Total Protein: 7.1 g/dL (ref 6.5–8.1)

## 2015-11-30 LAB — I-STAT TROPONIN, ED
Troponin i, poc: 0 ng/mL (ref 0.00–0.08)
Troponin i, poc: 0.01 ng/mL (ref 0.00–0.08)

## 2015-11-30 LAB — D-DIMER, QUANTITATIVE: D-Dimer, Quant: <0.27 ug/mL-FEU (ref 0.00–0.50)

## 2015-11-30 MED ORDER — ASPIRIN 81 MG PO CHEW
324.0000 mg | CHEWABLE_TABLET | Freq: Once | ORAL | Status: AC
  Administered 2015-11-30: 324 mg via ORAL
  Filled 2015-11-30: qty 4

## 2015-11-30 MED ORDER — KETOROLAC TROMETHAMINE 30 MG/ML IJ SOLN
30.0000 mg | Freq: Once | INTRAMUSCULAR | Status: AC
  Administered 2015-11-30: 30 mg via INTRAVENOUS
  Filled 2015-11-30: qty 1

## 2015-11-30 NOTE — Discharge Instructions (Signed)
The cardiology clinic will be calling you to schedule your follow up appointment. If you have not heard from them in 2 days, call the number listed.  Please return to the ER for new or worsening symptoms, any additional concerns.

## 2015-11-30 NOTE — ED Notes (Signed)
Pt tried to [place]. E-sign not working

## 2015-11-30 NOTE — ED Notes (Signed)
Lab aware of add on D-Dimer

## 2015-11-30 NOTE — ED Notes (Signed)
Pt reports L sided chest pain radiating to L arm, also reports SOB. Onset Saturday evening. 8/10 pain upon arrival to ED.

## 2015-11-30 NOTE — ED Provider Notes (Signed)
CSN: [id number]     Arrival date & time 11/30/15  [id number] History   First MD Initiated Contact with Patient 11/30/15 1134     Chief Complaint  Patient presents with  . Chest Pain     (Consider location/radiation/quality/duration/timing/severity/associated sxs/prior Treatment) Patient is a 50 y.o. male presenting with chest pain. The history is provided by the patient and medical records. No language interpreter was used.  Chest Pain Associated symptoms: palpitations and shortness of breath   Associated symptoms: no abdominal pain, no back pain, no cough, no diaphoresis, no dizziness, no fever, no headache, no nausea, not vomiting and no weakness    Chapman Trettin. is a 50 y.o. male  with a PMH of neurosarcoidosis, HTN, HLD, PVD, sleep apnea who presents to the Emergency Department complaining of gradually worsening achy left-sided chest pain which radiates down left arm that began yesterday afternoon. Associated symptoms include intermittent 3-4 second episiodes of palpitations associated with shortness of breath. No medications taken PTA, not on anti-coagulation. Taking all home medications as directed. Denies associated fever, diaphoresis, exertional dyspnea. Negative heart cath in 2014 with EF 50-55% per chart review.    Past Medical History  Diagnosis Date  . B12 DEFICIENCY 03/06/2007  . CARDIOMYOPATHY, IDIOPATHIC HYPERTROPHIC 03/06/2007  . DEPRESSION 03/06/2007  . GERD 06/01/2007  . HYPERCHOLESTEROLEMIA 03/06/2007  . HYPERLIPIDEMIA 11/08/2008  . IMPAIRED GLUCOSE TOLERANCE 11/08/2008  . NEPHROLITHIASIS, HX OF 04/07/2010  . 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 OSTEOARTHRITIS 06/01/2007  . Palpitations 10/23/2007  . SYNDROME, CHRONIC PAIN 03/06/2007  . Cancer [facility])     prostate ca  . Prostate cancer ([place])   . HYPERTENSION 03/06/2007    [name]  . Pneumonia     hx of  . Dysrhythmia     sees [name] for "palpitations"  . DVT (deep venous thrombosis) ([place])   . Anxiety   . Peripheral  vascular disease (H[facility]) 03    dvt  . SLEEP APNEA 03/06/2007    uses vpap, last sleep study 2011, Dr. Alanson Puls, at [facility] of breath     infreq  . History of kidney stones   . Anginal pain ([place])     s/p cardiac cath 07/19/13 showing NL coronaries, EF 50-55%   Past Surgical History  Procedure Laterality Date  . Decompression and fusion      cervicothoracic  . Partial nephrectomy Left     bx  . Cardiac catheterization    . Lumbar fusion    . Prostate surgery      robotic prostatectomy  . Penile prosthesis implant    . Tonsillectomy    . Leg skin lesion  biopsy / excision      left leg  . Eye surgery Bilateral     blepharoplasty  . Spinal cord stimulator insertion N/A 04/11/2014    Procedure: LUMBAR SPINAL CORD STIMULATOR INSERTION;  Surgeon: Newman Pies, MD;  Location: [place] NEURO ORS;  Service: Neurosurgery;  Laterality: N/A;  . Left heart catheterization with coronary angiogram N/A 07/19/2013    Procedure: LEFT HEART CATHETERIZATION WITH CORONARY ANGIOGRAM;  Surgeon: Jolaine Artist, MD;  Location: [facility] CATH LAB;  Service: Cardiovascular;  Laterality: N/A;   Family History  Problem Relation Age of Onset  . Heart disease Neg Hx     family hx CHF  . Kidney disease Neg Hx     family hx   . Cancer Neg Hx     family hx prostate   Social History  Substance Use Topics  . Smoking status: Former Smoker -- 0.50 packs/day for 3 years    Types: Cigarettes    Quit date: 09/07/1983  . Smokeless tobacco: Never Used  . Alcohol Use: No    Review of Systems  Constitutional: Negative for fever, chills and diaphoresis.  HENT: Negative for congestion and sore throat.   Eyes: Negative for visual disturbance.  Respiratory: Positive for shortness of breath. Negative for cough and wheezing.   Cardiovascular: Positive for chest pain and palpitations. Negative for leg swelling.  Gastrointestinal: Negative for nausea, vomiting and abdominal pain.  Genitourinary: Negative for  dysuria.  Musculoskeletal: Negative for back pain and neck pain.  Skin: Negative for rash.  Neurological: Negative for dizziness, weakness and headaches.  All other systems reviewed and are negative.     Allergies  Adhesive and Pollen extract  Home Medications   Prior to Admission medications   Medication Sig Start Date End Date Taking? Authorizing Provider  albuterol (PROVENTIL HFA;VENTOLIN HFA) 108 (90 BASE) MCG/ACT inhaler Inhale 2 puffs into the lungs 2 (two) times daily as needed for wheezing or shortness of breath.    Yes Historical Provider, MD  amitriptyline (ELAVIL) 25 MG tablet Take 1 tablet (25 mg total) by mouth at bedtime. 09/12/12  Yes Marletta Lor, MD  atenolol (TENORMIN) 25 MG tablet TAKE 1 TABLET BY MOUTH TWICE DAILY 06/09/15  Yes Marletta Lor, MD  atorvastatin (LIPITOR) 40 MG tablet Take 40 mg by mouth daily.   Yes Historical Provider, MD  azaTHIOprine (IMURAN) 50 MG tablet Take 200 mg by mouth daily.   Yes Historical Provider, MD  buPROPion (WELLBUTRIN XL) 300 MG 24 hr tablet TAKE ONE TABLET BY MOUTH EVERY MORNING 11/07/15  Yes Marletta Lor, MD  cyanocobalamin (,VITAMIN B-12,) 1000 MCG/ML injection Inject 1,000 mcg into the muscle every 30 (thirty) days. On or about the 1st of each month   Yes Historical Provider, MD  cycloSPORINE (RESTASIS) 0.05 % ophthalmic emulsion Place 2 drops into both eyes 2 (two) times daily.    Yes Historical Provider, MD  dicyclomine (BENTYL) 10 MG capsule TAKE 1 CAPSULE BY MOUTH 4 TIMES DAILY 07/28/15  Yes Marletta Lor, MD  DULoxetine (CYMBALTA) 30 MG capsule TAKE ONE CAPSULE BY MOUTH DAILY Patient taking differently: TAKE ONE CAPSULE BY MOUTH DAILY WITH A 60 MG CAPSULE FOR A 90 MG DOSE 06/09/15  Yes Marletta Lor, MD  DULoxetine (CYMBALTA) 60 MG capsule TAKE ONE CAPSULE BY MOUTH DAILY Patient taking differently: TAKE ONE CAPSULE BY MOUTH DAILY WITH A 30 MG CAPSULE FOR A 90 MG DOSE 11/07/15  Yes Marletta Lor,  MD  erythromycin ophthalmic ointment Place 1 application into both eyes at bedtime.  04/15/14  Yes Historical Provider, MD  gabapentin (NEURONTIN) 300 MG capsule TAKE 3 CAPSULES BY MOUTH FOUR TIMES DAILY 12/06/14  Yes Marletta Lor, MD  guaiFENesin (MUCINEX) 600 MG 12 hr tablet Take 600 mg by mouth 2 (two) times daily.    Yes Historical Provider, MD  ibuprofen (ADVIL,MOTRIN) 200 MG tablet Take 400 mg by mouth every 6 (six) hours as needed (pain).   Yes Historical Provider, MD  lidocaine (XYLOCAINE) 5 % ointment Apply 1 application topically 2 (two) times daily.   Yes Historical Provider, MD  NEXIUM 40 MG capsule TAKE ONE CAPSULE BY MOUTH DAILY 10/09/14  Yes Marletta Lor, MD  oxybutynin (DITROPAN-XL) 5 MG 24 hr tablet Take 5 mg by mouth daily.  01/30/15  Yes  Historical Provider, MD  oxyCODONE-acetaminophen (PERCOCET) 10-325 MG tablet Take 1 tablet by mouth See admin instructions. Take 1 tablet by mouth every morning and 1 tablet before 6pm   Yes Historical Provider, MD  Polyethyl Glycol-Propyl Glycol (SYSTANE PRESERVATIVE FREE OP) Place 1 drop into both eyes 2 (two) times daily.   Yes Historical Provider, MD  promethazine (PHENERGAN) 25 MG tablet Take 25 mg by mouth every 6 (six) hours as needed for nausea.    Yes Historical Provider, MD  tiZANidine (ZANAFLEX) 2 MG tablet Take 1 tablet (2 mg total) by mouth every 6 (six) hours as needed for muscle spasms. [id number]  Yes Delora Fuel, MD  triamcinolone (NASACORT AQ) 55 MCG/ACT AERO nasal inhaler Place 1 spray into both nostrils daily as needed (dry mouth).    Yes Historical Provider, MD  triamcinolone ointment (KENALOG) 0.1 % Apply 1 application topically 2 (two) times daily.   Yes Historical Provider, MD  amitriptyline (ELAVIL) 25 MG tablet TAKE 1 TABLET (25 MG TOTAL) BY MOUTH AT BEDTIME. Patient not taking: Reported on 11/30/2015 12/06/14   Marletta Lor, MD  dicyclomine (BENTYL) 10 MG capsule TAKE 1 CAPSULE BY MOUTH 4 TIMES DAILY Patient not  taking: Reported on 11/30/2015 06/09/15   Marletta Lor, MD  ibuprofen (ADVIL,MOTRIN) 600 MG tablet Take 1 tablet (600 mg total) by mouth every 6 (six) hours as needed. Patient not taking: Reported on 11/30/2015 04/19/14   Varney Biles, MD  naproxen (NAPROSYN) 500 MG tablet Take 1 tablet (500 mg total) by mouth 2 (two) times daily. Patient not taking: Reported on 11/30/2015 [id number]   Delora Fuel, MD  omeprazole (PRILOSEC) 20 MG capsule Take 1 capsule (20 mg total) by mouth daily. Patient not taking: Reported on 11/30/2015 10/15/14   Jola Schmidt, MD  oxyCODONE-acetaminophen (PERCOCET) 5-325 MG per tablet Take 1 tablet by mouth every 4 (four) hours as needed for moderate pain. Patient not taking: Reported on 11/30/2015 [id number]   Delora Fuel, MD   BP 123456 mmHg  Pulse 70  Temp(Src) 97.8 F (36.6 C) (Oral)  Resp 22  Ht 5\' 9"  (1.753 m)  Wt 102.059 kg  BMI 33.21 kg/m2  SpO2 99% Physical Exam  Constitutional: He is oriented to person, place, and time. He appears well-developed and well-nourished.  Alert and in no acute distress  HENT:  Head: Normocephalic and atraumatic.  Cardiovascular: Normal rate, regular rhythm, normal heart sounds and intact distal pulses.  Exam reveals no gallop and no friction rub.   No murmur heard. Pulmonary/Chest: Effort normal and breath sounds normal. No respiratory distress. He has no wheezes. He has no rales. He exhibits no tenderness.  Abdominal: Soft. Bowel sounds are normal. He exhibits no distension and no mass. There is no tenderness. There is no rebound and no guarding.  Musculoskeletal: He exhibits no edema.  Neurological: He is alert and oriented to person, place, and time.  Skin: Skin is warm and dry. He is not diaphoretic.  Nursing note and vitals reviewed.   ED Course  Procedures (including critical care time) Labs Review Labs Reviewed  CBC WITH DIFFERENTIAL/PLATELET - Abnormal; Notable for the following:    WBC 2.9 (*)    Neutro Abs 1.3 (*)     All other components within normal limits  COMPREHENSIVE METABOLIC PANEL - Abnormal; Notable for the following:    Glucose, Bld 100 (*)    All other components within normal limits  D-DIMER, QUANTITATIVE (NOT AT [facility])  Randolm Idol, ED  I-STAT  TROPOININ, ED    Imaging Review Dg Chest 2 View  11/30/2015  CLINICAL DATA:  2 days patient has had left sided chest and left arm pain, SOB. HX HTN, cancer prostate, pneumonia,DVT, cardiac catheterization EXAM: CHEST  2 VIEW COMPARISON:  10/15/2014 FINDINGS: Spinal cord stimulation electrode noted in the thoracic spine central canal. Normal mediastinum and cardiac silhouette. Normal pulmonary vasculature. No evidence of effusion, infiltrate, or pneumothorax. No acute bony abnormality. Anterior cervical fusion IMPRESSION: No acute cardiopulmonary process. Electronically Signed   By: Suzy Bouchard M.D.   On: 11/30/2015 10:17   I have personally reviewed and evaluated these images and lab results as part of my medical decision-making.   EKG Interpretation   Date/Time:  Sunday November 30 2015 15:01:53 EDT Ventricular Rate:  68 PR Interval:  185 QRS Duration: 83 QT Interval:  379 QTC Calculation: 403 R Axis:   -8 Text Interpretation:  Sinus rhythm Consider left atrial enlargement  Baseline wander in lead(s) V1 no significant change since earlier in the  day Confirmed by [place]  MD, SCOTT ([id number]) on 11/30/2015 3:04:43 PM        EKG Interpretation  Date/Time:  Sunday November 30 2015 15:01:53 EDT Ventricular Rate:  68 PR Interval:  185 QRS Duration: 83 QT Interval:  379 QTC Calculation: 403 R Axis:   -8 Text Interpretation:  Sinus rhythm Consider left atrial enlargement Baseline wander in lead(s) V1 no significant change since earlier in the day Confirmed by [place] ([id number]) on 11/30/2015 3:04:43 PM       MDM   Final diagnoses:  Chest pain, unspecified chest pain type   [name] presents with left-sided chest  pain associated with intermitted episodes of palpitations/sob. He had negative cardiac cath in 2014 with EF of 50-55%. EKG reviewed with no acute changes. 324 ASA given upon arrival to ED room.   Labs: d-dimer, trop x 2 negative. CBC and CMP reassuring.  Imaging: CXR unremarkable.   Moderate improvement in pain after Toradol. Repeat EKG was performed which remains without acute changes.   Consults: Given patient has a HEART score of 4, cardiology was consulted. They will call patient to schedule close follow up.   Patient given strict return precautions and informed that cardiology will be calling him to schedule follow up appointment. Will also provide cards contact info on discharge instructions. Patient verbalizes understanding of plan and agrees to return for exertional chest pain, shortness of breath, new or worsening symptoms.   Patient seen by and discussed with Dr. Regenia Skeeter who agrees with treatment plan.   [facility] Ward, PA-C 11/30/15 [place], MD 12/04/15 [phone]

## 2015-12-09 ENCOUNTER — Encounter: Payer: Self-pay | Admitting: Cardiology

## 2015-12-09 ENCOUNTER — Ambulatory Visit (INDEPENDENT_AMBULATORY_CARE_PROVIDER_SITE_OTHER): Payer: BC Managed Care – PPO | Admitting: Cardiology

## 2015-12-09 VITALS — BP 118/86 | HR 88 | Ht 69.0 in | Wt 232.0 lb

## 2015-12-09 DIAGNOSIS — R002 Palpitations: Secondary | ICD-10-CM

## 2015-12-09 DIAGNOSIS — I422 Other hypertrophic cardiomyopathy: Secondary | ICD-10-CM | POA: Diagnosis not present

## 2015-12-09 DIAGNOSIS — R42 Dizziness and giddiness: Secondary | ICD-10-CM

## 2015-12-09 DIAGNOSIS — R0789 Other chest pain: Secondary | ICD-10-CM

## 2015-12-09 NOTE — Progress Notes (Signed)
12/09/2015 John Manns Jr.   Oct 16, 1965  [id number]  Primary Physician Nyoka Cowden, MD Primary Cardiologist: [name]   Reason for Visit/CC: Post ED f/u for CP and palpitations   HPI:  50 y/o AAM followed by [name]. He has been followed in the past at [facility] for pulmonary and neurosarcoidosis. He has a hx of idiopathic hypertrophic cardiomyopathy, depression, HLD, GERD, HTN, and glucose intolerance. He was seen for chest pain by cardiology in 12/2011. Myoview 12/29/11: No ischemia, EF 66%. He was admitted 3/28-4/1 with chest pain. He was again seen by cardiology. Symptoms were felt to be atypical for ischemia. Cardiac markers were negative. D-dimer was negative. Echocardiogram 12/01/12: Mild focal basal hypertrophy the septum, EF 0000000, grade 1 diastolic dysfunction, mild LAE. Patient had palpitations. He had one ventricular couplet on telemetry.In 07/2013, he ws revaluated for CP and ultimately underwent a LHC which showed normal coronaries. EF was 50-55%. His CP was noncardiac. He has not been seen by our practice since that time. He was being followed some at [facility] in the past with cardiac MRIs as there was concern for possible sarcoid cardiac involvement. However he now has a spinal stimulator, thus he is unable to get MRIs. He has not had a cardiac MRI in several years. As far as he knows, no diagnosis of cardiac sarcoid was made.   He was recently seen at the [facility] ED on 11/30/15 with a complaint of chest pain, described as gradually worsening achy left-sided chest pain radiating down his left arm. Symptoms were associated with brief palpitations.  W/u in the ED was negative, including normal D-dimer, negative troponins x 2 and normal CBC and CMP. His CXR was also unremarkable. EKGs showed no ischemic abnormalities but did show PVCs. He had moderate improvement with Toradol. He was advised to f/u in our office for further recommendations.   He continues  to  note episodic left sided chest pain radiating to his neck and left arm. Symptoms occur with exertion and are relieved with rest. He also note tachypalpiations, dizziness and near syncope. No frank syncope. He is currently CP free. His BP and HR are both well controlled in clinic today. EKG showed NSR. HR 88 bpm. No PVCs noted. Nonischemic.    Current Outpatient Prescriptions  Medication Sig Dispense Refill  . albuterol (PROVENTIL HFA;VENTOLIN HFA) 108 (90 BASE) MCG/ACT inhaler Inhale 2 puffs into the lungs 2 (two) times daily as needed for wheezing or shortness of breath.     Marland Kitchen amitriptyline (ELAVIL) 25 MG tablet Take 1 tablet (25 mg total) by mouth at bedtime. 30 tablet 2  . atenolol (TENORMIN) 25 MG tablet TAKE 1 TABLET BY MOUTH TWICE DAILY 180 tablet 0  . atorvastatin (LIPITOR) 40 MG tablet Take 40 mg by mouth daily.    Marland Kitchen azaTHIOprine (IMURAN) 50 MG tablet Take 200 mg by mouth daily.    Marland Kitchen buPROPion (WELLBUTRIN XL) 300 MG 24 hr tablet TAKE ONE TABLET BY MOUTH EVERY MORNING 90 tablet 0  . cyanocobalamin (,VITAMIN B-12,) 1000 MCG/ML injection Inject 1,000 mcg into the muscle every 30 (thirty) days. On or about the 1st of each month    . cycloSPORINE (RESTASIS) 0.05 % ophthalmic emulsion Place 2 drops into both eyes 2 (two) times daily.     Marland Kitchen dicyclomine (BENTYL) 10 MG capsule TAKE 1 CAPSULE BY MOUTH 4 TIMES DAILY 360 capsule 0  . DULoxetine (CYMBALTA) 30 MG capsule TAKE ONE CAPSULE BY MOUTH DAILY (Patient taking  differently: TAKE ONE CAPSULE BY MOUTH DAILY WITH A 60 MG CAPSULE FOR A 90 MG DOSE) 90 capsule 0  . DULoxetine (CYMBALTA) 60 MG capsule TAKE ONE CAPSULE BY MOUTH DAILY (Patient taking differently: TAKE ONE CAPSULE BY MOUTH DAILY WITH A 30 MG CAPSULE FOR A 90 MG DOSE) 90 capsule 0  . erythromycin ophthalmic ointment Place 1 application into both eyes at bedtime.     . gabapentin (NEURONTIN) 300 MG capsule TAKE 3 CAPSULES BY MOUTH FOUR TIMES DAILY 360 capsule 0  . guaiFENesin (MUCINEX)  600 MG 12 hr tablet Take 600 mg by mouth 2 (two) times daily.     Marland Kitchen lidocaine (XYLOCAINE) 5 % ointment Apply 1 application topically 2 (two) times daily.    Marland Kitchen NEXIUM 40 MG capsule TAKE ONE CAPSULE BY MOUTH DAILY 90 capsule 2  . oxybutynin (DITROPAN-XL) 5 MG 24 hr tablet Take 5 mg by mouth daily.     Marland Kitchen oxyCODONE-acetaminophen (PERCOCET) 10-325 MG tablet Take 1 tablet by mouth See admin instructions. Take 1 tablet by mouth every morning and 1 tablet before 6pm    . Polyethyl Glycol-Propyl Glycol (SYSTANE PRESERVATIVE FREE OP) Place 1 drop into both eyes 2 (two) times daily.    . promethazine (PHENERGAN) 25 MG tablet Take 25 mg by mouth every 6 (six) hours as needed for nausea.     Marland Kitchen triamcinolone (NASACORT AQ) 55 MCG/ACT AERO nasal inhaler Place 1 spray into both nostrils daily as needed (dry mouth).     . triamcinolone ointment (KENALOG) 0.1 % Apply 1 application topically 2 (two) times daily.     No current facility-administered medications for this visit.    Allergies  Allergen Reactions  . Adhesive [Tape] Other (See Comments)    Skin irritation  . Pollen Extract Other (See Comments)    Stuffy nose    Social History   Social History  . Marital Status: Married    Spouse Name: N/A  . Number of Children: N/A  . 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 Smoking status: Former Smoker -- 0.50 packs/day for 3 years    Types: Cigarettes    Quit date: 09/07/1983  . Smokeless tobacco: Never Used  . Alcohol Use: No  . Drug Use: No  . Sexual Activity: Yes   Other Topics Concern  . Not on file   Social History Narrative     Review of Systems: General: negative for chills, fever, night sweats or weight changes.  Cardiovascular: negative for chest pain, dyspnea on exertion, edema, orthopnea, palpitations, paroxysmal nocturnal dyspnea or shortness of breath Dermatological: negative for rash Respiratory: negative for cough or  wheezing Urologic: negative for hematuria Abdominal: negative for nausea, vomiting, diarrhea, bright red blood per rectum, melena, or hematemesis Neurologic: negative for visual changes, syncope, or dizziness All other systems reviewed and are otherwise negative except as noted above.    Blood pressure 118/86, pulse 88, height 5\' 9"  (1.753 m), weight 232 lb (105.235 kg), SpO2 95 %.  General appearance: alert, cooperative and no distress Neck: no carotid bruit and no JVD Lungs: clear to auscultation bilaterally Heart: regular rate and rhythm, S1, S2 normal, no murmur, click, rub or gallop Extremities: no LEE Pulses: 2+ and symmetric Skin: warm and dry Neurologic: Grossly normal  EKG NSR. HR 88 bpm. Non ischemic.   ASSESSMENT AND PLAN:   1. Chest Pain: h/o normal cath in 07/2013. However he  has a history of hypertrophic cardiomyopathy and h/o sarcoid, although there has never been a diagnosis of cardiac involvement. Given his recent symptoms of exertional lefts sided chest pain, we will obtain a NST to r/o ischemia. Will also check a 2D echo.   2. Hypertrophic Cardiomyopathy: last echo was in 3/ 2014. This showed mild focal basal hypertrophy of the septum. Systolic function was normal. The estimated ejection fraction was in the range of 55% to 60%. Regional wall motion abnormalities could not be excluded. Doppler parameters were consistent with abnormal left ventricular relaxation (grade 1 diastolic dysfunction). Given it has been 3 years since this has been assessed, we will obtain a repeat echo to look for disease progression. Will also assess EF and wall motion.   3. Tachy palpitations: associated with chest discomfort, dizziness and near syncope. Given his h/o hypertrophic cardiomyopathy and presence of PVCs on ED EKGs, we will further evaluate with a 30 day monitor to r/o worrisome ventricular arrhthymias.   PLAN  F/u in 4 weeks after monitor with either [name] or myself.    John Jester PA-C 12/09/2015 3:48 PM

## 2015-12-09 NOTE — Patient Instructions (Signed)
Medication Instructions:  None  Labwork: None  Testing/Procedures: Your physician has recommended that you wear an event monitor. Event monitors are medical devices that record the heart's electrical activity. Doctors most often [place] these monitors to diagnose arrhythmias. Arrhythmias are problems with the speed or rhythm of the heartbeat. The monitor is a small, portable device. You can wear one while you do your normal daily activities. This is usually used to diagnose what is causing palpitations/syncope (passing out).   Your physician has requested that you have an echocardiogram. Echocardiography is a painless test that uses sound waves to create images of your heart. It provides your doctor with information about the size and shape of your heart and how well your heart's chambers and valves are working. This procedure takes approximately one hour. There are no restrictions for this procedure.   Your physician has requested that you have en exercise stress myoview. For further information please visit [email or web address]. Please follow instruction sheet, as given.    Follow-Up: Your physician recommends that you schedule a follow-up appointment in: 4 weeks with [name] or Lyda Jester, PA-C.   Any Other Special Instructions Will Be Listed Below (If Applicable).     If you need a refill on your cardiac medications before your next appointment, please call your pharmacy.

## 2015-12-10 NOTE — Addendum Note (Signed)
Addended by: Freada Bergeron on: 12/10/2015 01:12 PM   Modules accepted: Orders

## 2015-12-11 ENCOUNTER — Telehealth: Payer: Self-pay | Admitting: Cardiology

## 2015-12-11 NOTE — Telephone Encounter (Signed)
Left message for pt to call back to get a sooner appt. Pt was to see [name] or Lyda Jester, PA-C 4 weeks from [place] on 4/4. Pt needs to be scheduled first or second week of May.

## 2015-12-12 NOTE — Telephone Encounter (Signed)
Pt called and spoke to scheduling and appt was moved up to 5/8.

## 2015-12-14 ENCOUNTER — Encounter (HOSPITAL_COMMUNITY): Payer: Self-pay

## 2015-12-14 ENCOUNTER — Emergency Department (HOSPITAL_COMMUNITY): Payer: BC Managed Care – PPO

## 2015-12-14 ENCOUNTER — Observation Stay (HOSPITAL_COMMUNITY)
Admission: EM | Admit: 2015-12-14 | Discharge: 2015-12-16 | Disposition: A | Discharge status: Home / Self Care | Payer: BC Managed Care – PPO | Attending: Internal Medicine | Admitting: Internal Medicine

## 2015-12-14 DIAGNOSIS — E538 Deficiency of other specified B group vitamins: Secondary | ICD-10-CM | POA: Diagnosis not present

## 2015-12-14 DIAGNOSIS — Z86718 Personal history of other venous thrombosis and embolism: Secondary | ICD-10-CM | POA: Diagnosis not present

## 2015-12-14 DIAGNOSIS — Z8701 Personal history of pneumonia (recurrent): Secondary | ICD-10-CM | POA: Insufficient documentation

## 2015-12-14 DIAGNOSIS — I428 Other cardiomyopathies: Secondary | ICD-10-CM | POA: Insufficient documentation

## 2015-12-14 DIAGNOSIS — R072 Precordial pain: Secondary | ICD-10-CM | POA: Insufficient documentation

## 2015-12-14 DIAGNOSIS — E739 Lactose intolerance, unspecified: Secondary | ICD-10-CM | POA: Insufficient documentation

## 2015-12-14 DIAGNOSIS — Z87891 Personal history of nicotine dependence: Secondary | ICD-10-CM | POA: Insufficient documentation

## 2015-12-14 DIAGNOSIS — R079 Chest pain, unspecified: Principal | ICD-10-CM | POA: Insufficient documentation

## 2015-12-14 DIAGNOSIS — E7800 Pure hypercholesterolemia, unspecified: Secondary | ICD-10-CM | POA: Insufficient documentation

## 2015-12-14 DIAGNOSIS — Z79899 Other long term (current) drug therapy: Secondary | ICD-10-CM | POA: Insufficient documentation

## 2015-12-14 DIAGNOSIS — F419 Anxiety disorder, unspecified: Secondary | ICD-10-CM | POA: Insufficient documentation

## 2015-12-14 DIAGNOSIS — Z87442 Personal history of urinary calculi: Secondary | ICD-10-CM | POA: Insufficient documentation

## 2015-12-14 DIAGNOSIS — I10 Essential (primary) hypertension: Secondary | ICD-10-CM | POA: Diagnosis not present

## 2015-12-14 DIAGNOSIS — G4730 Sleep apnea, unspecified: Secondary | ICD-10-CM | POA: Diagnosis not present

## 2015-12-14 DIAGNOSIS — F329 Major depressive disorder, single episode, unspecified: Secondary | ICD-10-CM | POA: Insufficient documentation

## 2015-12-14 DIAGNOSIS — I209 Angina pectoris, unspecified: Secondary | ICD-10-CM | POA: Diagnosis not present

## 2015-12-14 DIAGNOSIS — K219 Gastro-esophageal reflux disease without esophagitis: Secondary | ICD-10-CM | POA: Diagnosis not present

## 2015-12-14 DIAGNOSIS — M1990 Unspecified osteoarthritis, unspecified site: Secondary | ICD-10-CM | POA: Diagnosis not present

## 2015-12-14 DIAGNOSIS — G894 Chronic pain syndrome: Secondary | ICD-10-CM | POA: Insufficient documentation

## 2015-12-14 DIAGNOSIS — E785 Hyperlipidemia, unspecified: Secondary | ICD-10-CM | POA: Diagnosis present

## 2015-12-14 DIAGNOSIS — G4733 Obstructive sleep apnea (adult) (pediatric): Secondary | ICD-10-CM | POA: Diagnosis present

## 2015-12-14 DIAGNOSIS — D869 Sarcoidosis, unspecified: Secondary | ICD-10-CM | POA: Diagnosis present

## 2015-12-14 HISTORY — DX: Other hypertrophic cardiomyopathy: I42.2

## 2015-12-14 LAB — CBC
HCT: 41.3 % (ref 39.0–52.0)
Hemoglobin: 14.1 g/dL (ref 13.0–17.0)
MCH: 29.6 pg (ref 26.0–34.0)
MCHC: 34.1 g/dL (ref 30.0–36.0)
MCV: 86.6 fL (ref 78.0–100.0)
Platelets: 318 10*3/uL (ref 150–400)
RBC: 4.77 MIL/uL (ref 4.22–5.81)
RDW: 13.9 % (ref 11.5–15.5)
WBC: 3.6 10*3/uL — ABNORMAL LOW (ref 4.0–10.5)

## 2015-12-14 LAB — I-STAT TROPONIN, ED: Troponin i, poc: 0 ng/mL (ref 0.00–0.08)

## 2015-12-14 LAB — BASIC METABOLIC PANEL
Anion gap: 9 (ref 5–15)
BUN: 11 mg/dL (ref 6–20)
CO2: 21 mmol/L — ABNORMAL LOW (ref 22–32)
Calcium: 9.2 mg/dL (ref 8.9–10.3)
Chloride: 109 mmol/L (ref 101–111)
Creatinine, Ser: 1.02 mg/dL (ref 0.61–1.24)
GFR calc Af Amer: >60 mL/min (ref >60)
GFR calc non Af Amer: >60 mL/min (ref >60)
Glucose, Bld: 111 mg/dL — ABNORMAL HIGH (ref 65–99)
Potassium: 3.7 mmol/L (ref 3.5–5.1)
Sodium: 139 mmol/L (ref 135–145)

## 2015-12-14 MED ORDER — NITROGLYCERIN 0.4 MG SL SUBL
0.4000 mg | SUBLINGUAL_TABLET | SUBLINGUAL | Status: AC | PRN
  Administered 2015-12-14 – 2015-12-15 (×3): 0.4 mg via SUBLINGUAL
  Filled 2015-12-14: qty 1

## 2015-12-14 MED ORDER — ASPIRIN 81 MG PO CHEW
324.0000 mg | CHEWABLE_TABLET | Freq: Once | ORAL | Status: AC
  Administered 2015-12-14: 324 mg via ORAL
  Filled 2015-12-14: qty 4

## 2015-12-14 NOTE — ED Notes (Signed)
Pt is due to have stress test and heart monitor next week.  Pt reports has been having constant chest pain and gets worse with exertion.  Today chest pain with exertion and short of breath.  Pt talking in complete sentences.

## 2015-12-14 NOTE — ED Provider Notes (Signed)
By signing my name below, I, Michiel Cowboy, attest that this documentation has been prepared under the direction and in the presence of Nyra Jabs, DO Electronically Signed: Michiel Cowboy, ED Scribe. 12/14/2015. 12:09 AM.  TIME SEEN: 11:47 PM  CHIEF COMPLAINT:   Chief Complaint  Patient presents with  . Chest Pain  . Shortness of Breath     HPI:  HPI Comments: [name]. is a 50 y.o. male with history of Sarcoidosis, idiopathic hypertrophic cardiomyopathy, hypertension, hyperlipidemia, previous tobacco use who presents to the Emergency Department complaining of an episode of  nagging, pressure-like chest pain, rated 7/10 x a few weeks. He states he has the CP when at rest and states it is occasionally exacerbated by exertion. He reports associated palpitations which he describes as "fluttering" and SOB beginning 6pm tonight. Pt states his symptoms have been intermittent.  Pt also notes radiation of pain in his neck and left arm.  He states that he was a smoker but no longer smokes. Pt has a hx of a cardiac catheterization in November 2014 that showed normal coronary arteries.  Was seen as an outpatient with cardiology on April 4 and has a stress test scheduled on April 25.States that he is scheduled to see cardiology next week to have a Holter monitor placed for these episodes of "fluttering". States he feels that his chest pain is getting worse. Reports that it is now coming on at rest.  He is having chest tightness currently. Was recently seen in the emergency department on March 26 and had 2 negative troponins and a negative d-dimer.  ROS: See HPI Constitutional: no fever  Eyes: no drainage  ENT: no runny nose   Cardiovascular: chest pain  Resp: SOB  GI: no vomiting GU: no dysuria Integumentary: no rash  Allergy: no hives  Musculoskeletal: no leg swelling  Neurological: no slurred speech ROS otherwise negative  PAST MEDICAL HISTORY/PAST SURGICAL HISTORY:  Past Medical  History  Diagnosis Date  . B12 DEFICIENCY 03/06/2007  . CARDIOMYOPATHY, IDIOPATHIC HYPERTROPHIC 03/06/2007  . DEPRESSION 03/06/2007  . GERD 06/01/2007  . HYPERCHOLESTEROLEMIA 03/06/2007  . HYPERLIPIDEMIA 11/08/2008  . IMPAIRED GLUCOSE TOLERANCE 11/08/2008  . NEPHROLITHIASIS, HX OF 04/07/2010  . 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 OSTEOARTHRITIS 06/01/2007  . Palpitations 10/23/2007  . SYNDROME, CHRONIC PAIN 03/06/2007  . Cancer [facility])     prostate ca  . Prostate cancer ([place])   . HYPERTENSION 03/06/2007    [name]  . Pneumonia     hx of  . Dysrhythmia     sees [name] for "palpitations"  . DVT (deep venous thrombosis) ([place])   . Anxiety   . Peripheral vascular disease ([facility]) 03    dvt  . SLEEP APNEA 03/06/2007    uses vpap, last sleep study 2011, Dr. Alanson Puls, at [place] of breath     infreq  . History of kidney stones   . Anginal pain ([place])     s/p cardiac cath 07/19/13 showing NL coronaries, EF 50-55%    MEDICATIONS:  Prior to Admission medications   Medication Sig Start Date End Date Taking? Authorizing Provider  albuterol (PROVENTIL HFA;VENTOLIN HFA) 108 (90 BASE) MCG/ACT inhaler Inhale 2 puffs into the lungs 2 (two) times daily as needed for wheezing or shortness of breath.    Yes Historical Provider, MD  amitriptyline (ELAVIL) 25 MG tablet Take 1 tablet (25 mg total) by mouth at bedtime. 09/12/12  Yes Marletta Lor, MD  atenolol (  TENORMIN) 25 MG tablet TAKE 1 TABLET BY MOUTH TWICE DAILY 06/09/15  Yes Marletta Lor, MD  atorvastatin (LIPITOR) 40 MG tablet Take 40 mg by mouth daily.   Yes Historical Provider, MD  azaTHIOprine (IMURAN) 50 MG tablet Take 200 mg by mouth daily.   Yes Historical Provider, MD  buPROPion (WELLBUTRIN XL) 300 MG 24 hr tablet TAKE ONE TABLET BY MOUTH EVERY MORNING 11/07/15  Yes Marletta Lor, MD  cyanocobalamin (,VITAMIN B-12,) 1000 MCG/ML injection Inject 1,000 mcg into the muscle every 30 (thirty) days. On or about the 1st of  each month   Yes Historical Provider, MD  cycloSPORINE (RESTASIS) 0.05 % ophthalmic emulsion Place 2 drops into both eyes 2 (two) times daily.    Yes Historical Provider, MD  dicyclomine (BENTYL) 10 MG capsule TAKE 1 CAPSULE BY MOUTH 4 TIMES DAILY 07/28/15  Yes Marletta Lor, MD  DULoxetine (CYMBALTA) 30 MG capsule TAKE ONE CAPSULE BY MOUTH DAILY Patient taking differently: TAKE ONE CAPSULE BY MOUTH DAILY WITH A 60 MG CAPSULE FOR A 90 MG DOSE 06/09/15  Yes Marletta Lor, MD  DULoxetine (CYMBALTA) 60 MG capsule TAKE ONE CAPSULE BY MOUTH DAILY Patient taking differently: TAKE ONE CAPSULE BY MOUTH DAILY WITH A 30 MG CAPSULE FOR A 90 MG DOSE 11/07/15  Yes Marletta Lor, MD  erythromycin ophthalmic ointment Place 1 application into both eyes at bedtime.  04/15/14  Yes Historical Provider, MD  gabapentin (NEURONTIN) 300 MG capsule TAKE 3 CAPSULES BY MOUTH FOUR TIMES DAILY 12/06/14  Yes Marletta Lor, MD  guaiFENesin (MUCINEX) 600 MG 12 hr tablet Take 600 mg by mouth 2 (two) times daily.    Yes Historical Provider, MD  lidocaine (XYLOCAINE) 5 % ointment Apply 1 application topically 2 (two) times daily.   Yes Historical Provider, MD  NEXIUM 40 MG capsule TAKE ONE CAPSULE BY MOUTH DAILY 10/09/14  Yes Marletta Lor, MD  oxybutynin (DITROPAN-XL) 5 MG 24 hr tablet Take 5 mg by mouth daily.  01/30/15  Yes Historical Provider, MD  oxyCODONE-acetaminophen (PERCOCET) 10-325 MG tablet Take 1 tablet by mouth See admin instructions. Take 1 tablet by mouth every morning and 1 tablet before 6pm   Yes Historical Provider, MD  Polyethyl Glycol-Propyl Glycol (SYSTANE PRESERVATIVE FREE OP) Place 1 drop into both eyes 2 (two) times daily.   Yes Historical Provider, MD  promethazine (PHENERGAN) 25 MG tablet Take 25 mg by mouth every 6 (six) hours as needed for nausea.    Yes Historical Provider, MD  triamcinolone (NASACORT AQ) 55 MCG/ACT AERO nasal inhaler Place 1 spray into both nostrils daily as needed  (dry mouth).    Yes Historical Provider, MD  triamcinolone ointment (KENALOG) 0.1 % Apply 1 application topically 2 (two) times daily.   Yes Historical Provider, MD    ALLERGIES:  Allergies  Allergen Reactions  . Adhesive [Tape] Other (See Comments)    Skin irritation  . Pollen Extract Other (See Comments)    Stuffy nose    SOCIAL HISTORY:  Social History  Substance Use Topics  . Smoking status: Former Smoker -- 0.50 packs/day for 3 years    Types: Cigarettes    Quit date: 09/07/1983  . Smokeless tobacco: Never Used  . Alcohol Use: No    FAMILY HISTORY: Family History  Problem Relation Age of Onset  . Heart disease Neg Hx     family hx CHF  . Kidney disease Neg Hx     family hx   .  Cancer Neg Hx     family hx prostate    EXAM: BP 120/83 mmHg  Pulse 73  Temp(Src) 98.5 F (36.9 C) (Oral)  Resp 20  Ht 5\' 9"  (1.753 m)  Wt 232 lb (105.235 kg)  BMI 34.24 kg/m2  SpO2 99% CONSTITUTIONAL: Alert and oriented and responds appropriately to questions. Well-appearing; well-nourished, Obese HEAD: Normocephalic EYES: Conjunctivae clear, PERRL ENT: normal nose; no rhinorrhea; moist mucous membranes NECK: Supple, no meningismus, no LAD  CARD: RRR; S1 and S2 appreciated; no murmurs, no clicks, no rubs, no gallops RESP: Normal chest excursion without splinting or tachypnea; breath sounds clear and equal bilaterally; no wheezes, no rhonchi, no rales, no hypoxia or respiratory distress, speaking full sentences ABD/GI: Normal bowel sounds; non-distended; soft, non-tender, no rebound, no guarding, no peritoneal signs BACK:  The back appears normal and is non-tender to palpation, there is no CVA tenderness EXT: Normal ROM in all joints; non-tender to palpation; no edema; normal capillary refill; no cyanosis, no calf tenderness or swelling    SKIN: Normal color for age and race; warm; no rash NEURO: Moves all extremities equally, sensation to light touch intact diffusely, cranial nerves  II through XII intact PSYCH: The patient's mood and manner are appropriate. Grooming and personal hygiene are appropriate.  MEDICAL DECISION MAKING: Patient here with concerning story for chest pain. He does have multiple risk factors for ACS. EKG shows no new ischemic abnormality. Concern for possible unstable angina now that symptoms are coming on at rest. He is scheduled for a stress test April 25. I feel he may need admission to the hospital for stress test to be done while hospitalized. We'll discuss with cardiology on call for recommendations. We'll give aspirin, nitroglycerin to get him pain-free. First troponin negative. Chest x-ray clear.  ED PROGRESS: 12:40 AM  D/w Dr. Susy Manor with cardiology who feels it is reasonable to admit patient for chest pain rule out. He agrees with [place] while in hospital.  Recommends medicine admission. Patient has had some improvement with nitroglycerin. Will give IV morphine. Pain down to a 4/10 after third nitroglycerin.   1:05 AM  Discussed patient's case with hospitalist, Dr. Arnoldo Morale.  Recommend admission to telemetry, observation bed.  I will place holding orders per their request. Patient and family (if present) updated with plan. Care transferred to hospitalist service.     EKG Interpretation  Date/Time:  Sunday December 14 2015 20:06:18 EDT Ventricular Rate:  92 PR Interval:  164 QRS Duration: 86 QT Interval:  342 QTC Calculation: 422 R Axis:   -13 Text Interpretation:  Normal sinus rhythm Normal ECG No significant change since last tracing Confirmed by WARD,  DO, KRISTEN [id number]) on 12/14/2015 11:13:00 PM        I personally performed the services described in this documentation, which was scribed in my presence. The recorded information has been reviewed and is accurate.     [place], DO 12/15/15 [phone]

## 2015-12-15 ENCOUNTER — Observation Stay (HOSPITAL_COMMUNITY): Payer: BC Managed Care – PPO

## 2015-12-15 ENCOUNTER — Observation Stay (HOSPITAL_BASED_OUTPATIENT_CLINIC_OR_DEPARTMENT_OTHER): Payer: BC Managed Care – PPO

## 2015-12-15 ENCOUNTER — Encounter (HOSPITAL_COMMUNITY): Payer: Self-pay | Admitting: Internal Medicine

## 2015-12-15 DIAGNOSIS — R079 Chest pain, unspecified: Secondary | ICD-10-CM | POA: Diagnosis present

## 2015-12-15 DIAGNOSIS — E785 Hyperlipidemia, unspecified: Secondary | ICD-10-CM | POA: Diagnosis not present

## 2015-12-15 DIAGNOSIS — R072 Precordial pain: Secondary | ICD-10-CM | POA: Insufficient documentation

## 2015-12-15 DIAGNOSIS — G4733 Obstructive sleep apnea (adult) (pediatric): Secondary | ICD-10-CM

## 2015-12-15 DIAGNOSIS — I10 Essential (primary) hypertension: Secondary | ICD-10-CM | POA: Diagnosis not present

## 2015-12-15 DIAGNOSIS — D869 Sarcoidosis, unspecified: Secondary | ICD-10-CM

## 2015-12-15 LAB — BASIC METABOLIC PANEL
Anion gap: 10 (ref 5–15)
BUN: 12 mg/dL (ref 6–20)
CO2: 23 mmol/L (ref 22–32)
Calcium: 9 mg/dL (ref 8.9–10.3)
Chloride: 106 mmol/L (ref 101–111)
Creatinine, Ser: 0.94 mg/dL (ref 0.61–1.24)
GFR calc Af Amer: >60 mL/min (ref >60)
GFR calc non Af Amer: >60 mL/min (ref >60)
Glucose, Bld: 102 mg/dL — ABNORMAL HIGH (ref 65–99)
Potassium: 3.5 mmol/L (ref 3.5–5.1)
Sodium: 139 mmol/L (ref 135–145)

## 2015-12-15 LAB — CBC
HCT: 39.2 % (ref 39.0–52.0)
Hemoglobin: 13.2 g/dL (ref 13.0–17.0)
MCH: 29.3 pg (ref 26.0–34.0)
MCHC: 33.7 g/dL (ref 30.0–36.0)
MCV: 86.9 fL (ref 78.0–100.0)
Platelets: 259 10*3/uL (ref 150–400)
RBC: 4.51 MIL/uL (ref 4.22–5.81)
RDW: 14 % (ref 11.5–15.5)
WBC: 2.9 10*3/uL — ABNORMAL LOW (ref 4.0–10.5)

## 2015-12-15 LAB — NM MYOCAR MULTI W/SPECT W/WALL MOTION / EF
LV dias vol: 116 mL (ref 62–150)
LV sys vol: 37 mL
MPHR: 170 {beats}/min
Peak HR: 116 {beats}/min
Percent HR: 68 %
RATE: 0.29
Rest HR: 66 {beats}/min
SDS: 8
SRS: 2
SSS: 10
TID: 1.1

## 2015-12-15 LAB — TROPONIN I
Troponin I: <0.03 ng/mL (ref <0.031)
Troponin I: <0.03 ng/mL (ref <0.031)
Troponin I: <0.03 ng/mL (ref <0.031)

## 2015-12-15 MED ORDER — TECHNETIUM TC 99M SESTAMIBI GENERIC - CARDIOLITE
10.0000 | Freq: Once | INTRAVENOUS | Status: AC | PRN
  Administered 2015-12-15: 10 via INTRAVENOUS

## 2015-12-15 MED ORDER — ACETAMINOPHEN 650 MG RE SUPP: 650.0000 mg | Freq: Four times a day (QID) | RECTAL | Status: DC | PRN

## 2015-12-15 MED ORDER — ERYTHROMYCIN 5 MG/GM OP OINT
1.0000 "application " | TOPICAL_OINTMENT | Freq: Every day | OPHTHALMIC | Status: DC
  Administered 2015-12-15: 1 via OPHTHALMIC
  Filled 2015-12-15: qty 3.5

## 2015-12-15 MED ORDER — PANTOPRAZOLE SODIUM 40 MG PO TBEC
40.0000 mg | DELAYED_RELEASE_TABLET | Freq: Every day | ORAL | Status: DC
  Administered 2015-12-15 – 2015-12-16 (×2): 40 mg via ORAL
  Filled 2015-12-15 (×2): qty 1

## 2015-12-15 MED ORDER — HYDROMORPHONE HCL 1 MG/ML IJ SOLN
0.5000 mg | INTRAMUSCULAR | Status: DC | PRN
  Administered 2015-12-15 (×2): 1 mg via INTRAVENOUS
  Filled 2015-12-15 (×2): qty 1

## 2015-12-15 MED ORDER — NITROGLYCERIN 2 % TD OINT: 0.5000 [in_us] | TOPICAL_OINTMENT | Freq: Four times a day (QID) | TRANSDERMAL | Status: DC

## 2015-12-15 MED ORDER — REGADENOSON 0.4 MG/5ML IV SOLN
0.4000 mg | Freq: Once | INTRAVENOUS | Status: AC
  Administered 2015-12-15: 0.4 mg via INTRAVENOUS

## 2015-12-15 MED ORDER — ATENOLOL 25 MG PO TABS
25.0000 mg | ORAL_TABLET | Freq: Two times a day (BID) | ORAL | Status: DC
  Administered 2015-12-15 – 2015-12-16 (×2): 25 mg via ORAL
  Filled 2015-12-15 (×3): qty 1

## 2015-12-15 MED ORDER — ONDANSETRON HCL 4 MG/2ML IJ SOLN
4.0000 mg | Freq: Once | INTRAMUSCULAR | Status: AC
  Administered 2015-12-15: 4 mg via INTRAVENOUS
  Filled 2015-12-15: qty 2

## 2015-12-15 MED ORDER — OXYBUTYNIN CHLORIDE ER 5 MG PO TB24
5.0000 mg | ORAL_TABLET | Freq: Every day | ORAL | Status: DC
  Administered 2015-12-15 – 2015-12-16 (×2): 5 mg via ORAL
  Filled 2015-12-15 (×2): qty 1

## 2015-12-15 MED ORDER — TECHNETIUM TC 99M SESTAMIBI GENERIC - CARDIOLITE
30.0000 | Freq: Once | INTRAVENOUS | Status: AC | PRN
  Administered 2015-12-15: 30 via INTRAVENOUS

## 2015-12-15 MED ORDER — BUPROPION HCL ER (XL) 150 MG PO TB24
300.0000 mg | ORAL_TABLET | Freq: Every morning | ORAL | Status: DC
  Administered 2015-12-15 – 2015-12-16 (×2): 300 mg via ORAL
  Filled 2015-12-15 (×2): qty 2

## 2015-12-15 MED ORDER — AZATHIOPRINE 50 MG PO TABS
200.0000 mg | ORAL_TABLET | Freq: Every day | ORAL | Status: DC
  Administered 2015-12-15 – 2015-12-16 (×2): 200 mg via ORAL
  Filled 2015-12-15 (×2): qty 4

## 2015-12-15 MED ORDER — REGADENOSON 0.4 MG/5ML IV SOLN
INTRAVENOUS | Status: AC
  Filled 2015-12-15: qty 5

## 2015-12-15 MED ORDER — MORPHINE SULFATE (PF) 4 MG/ML IV SOLN
4.0000 mg | Freq: Once | INTRAVENOUS | Status: AC
  Administered 2015-12-15: 4 mg via INTRAVENOUS
  Filled 2015-12-15: qty 1

## 2015-12-15 MED ORDER — SODIUM CHLORIDE 0.9% FLUSH
3.0000 mL | Freq: Two times a day (BID) | INTRAVENOUS | Status: DC
  Administered 2015-12-15 (×3): 3 mL via INTRAVENOUS

## 2015-12-15 MED ORDER — ONDANSETRON HCL 4 MG/2ML IJ SOLN: 4.0000 mg | Freq: Four times a day (QID) | INTRAMUSCULAR | Status: DC | PRN

## 2015-12-15 MED ORDER — ATORVASTATIN CALCIUM 40 MG PO TABS
40.0000 mg | ORAL_TABLET | Freq: Every day | ORAL | Status: DC
  Administered 2015-12-15: 40 mg via ORAL
  Filled 2015-12-15: qty 1

## 2015-12-15 MED ORDER — SODIUM CHLORIDE 0.9% FLUSH
3.0000 mL | Freq: Two times a day (BID) | INTRAVENOUS | Status: DC
  Administered 2015-12-15 – 2015-12-16 (×3): 3 mL via INTRAVENOUS

## 2015-12-15 MED ORDER — NITROGLYCERIN 2 % TD OINT
0.5000 [in_us] | TOPICAL_OINTMENT | Freq: Four times a day (QID) | TRANSDERMAL | Status: DC
  Administered 2015-12-15 – 2015-12-16 (×4): 0.5 [in_us] via TOPICAL
  Filled 2015-12-15: qty 0.5

## 2015-12-15 MED ORDER — ACETAMINOPHEN 325 MG PO TABS: 650.0000 mg | ORAL_TABLET | Freq: Four times a day (QID) | ORAL | Status: DC | PRN

## 2015-12-15 MED ORDER — DICYCLOMINE HCL 10 MG PO CAPS
10.0000 mg | ORAL_CAPSULE | Freq: Three times a day (TID) | ORAL | Status: DC
  Administered 2015-12-15 – 2015-12-16 (×6): 10 mg via ORAL
  Filled 2015-12-15 (×6): qty 1

## 2015-12-15 MED ORDER — DULOXETINE HCL 60 MG PO CPEP
90.0000 mg | ORAL_CAPSULE | Freq: Every day | ORAL | Status: DC
  Administered 2015-12-15 – 2015-12-16 (×2): 90 mg via ORAL
  Filled 2015-12-15 (×2): qty 1

## 2015-12-15 MED ORDER — SODIUM CHLORIDE 0.9 % IV SOLN: 250.0000 mL | INTRAVENOUS | Status: DC | PRN

## 2015-12-15 MED ORDER — SODIUM CHLORIDE 0.9% FLUSH: 3.0000 mL | INTRAVENOUS | Status: DC | PRN

## 2015-12-15 MED ORDER — CYCLOSPORINE 0.05 % OP EMUL
2.0000 [drp] | Freq: Two times a day (BID) | OPHTHALMIC | Status: DC
  Administered 2015-12-15 – 2015-12-16 (×3): 2 [drp] via OPHTHALMIC
  Filled 2015-12-15 (×3): qty 1

## 2015-12-15 MED ORDER — ASPIRIN EC 325 MG PO TBEC
325.0000 mg | DELAYED_RELEASE_TABLET | Freq: Every day | ORAL | Status: DC
  Administered 2015-12-15 – 2015-12-16 (×2): 325 mg via ORAL
  Filled 2015-12-15 (×2): qty 1

## 2015-12-15 MED ORDER — NITROGLYCERIN 2 % TD OINT
0.5000 [in_us] | TOPICAL_OINTMENT | Freq: Once | TRANSDERMAL | Status: AC
  Administered 2015-12-15: 0.5 [in_us] via TOPICAL
  Filled 2015-12-15: qty 1

## 2015-12-15 MED ORDER — ONDANSETRON HCL 4 MG PO TABS: 4.0000 mg | ORAL_TABLET | Freq: Four times a day (QID) | ORAL | Status: DC | PRN

## 2015-12-15 MED ORDER — OXYCODONE HCL 5 MG PO TABS
10.0000 mg | ORAL_TABLET | ORAL | Status: DC | PRN
  Administered 2015-12-15 (×2): 10 mg via ORAL
  Filled 2015-12-15 (×2): qty 2

## 2015-12-15 MED ORDER — ENOXAPARIN SODIUM 60 MG/0.6ML ~~LOC~~ SOLN
50.0000 mg | SUBCUTANEOUS | Status: DC
  Administered 2015-12-15 – 2015-12-16 (×2): 50 mg via SUBCUTANEOUS
  Filled 2015-12-15 (×2): qty 0.6

## 2015-12-15 MED ORDER — GABAPENTIN 300 MG PO CAPS
900.0000 mg | ORAL_CAPSULE | Freq: Four times a day (QID) | ORAL | Status: DC
  Administered 2015-12-15 – 2015-12-16 (×5): 900 mg via ORAL
  Filled 2015-12-15 (×5): qty 3

## 2015-12-15 MED ORDER — AMITRIPTYLINE HCL 25 MG PO TABS
25.0000 mg | ORAL_TABLET | Freq: Every day | ORAL | Status: DC
Start: 2015-12-15 — End: 2015-12-16
  Administered 2015-12-15: 25 mg via ORAL
  Filled 2015-12-15: qty 1

## 2015-12-15 NOTE — ED Notes (Signed)
Pain decreased to 6/10 after second ntg.

## 2015-12-15 NOTE — Consult Note (Signed)
CARDIOLOGY CONSULT NOTE   Patient ID: [name]. MRN: [id number] DOB/AGE: 51/21/1967 51 y.o.  Admit date: 12/14/2015  Primary Physician   Nyoka Cowden, MD Primary Cardiologist: [name] Reason for Consultation: Chest pain   HPI: [name] is a 50 y/o male with a past medical history of pulmonary and neurosarcoidosis, for which he is followed at [facility].  He has a hx of idiopathic hypertrophic cardiomyopathy, depression, HLD, GERD, HTN, and glucose intolerance.   He was seen in the ED on 12/15/15 with complaints of intermittent chest pain for 2-3 days. He does have some associated SOB, dizziness, nausea and palpitations. He says his pain is pressure like, it has occurred at rest and with exertion. Pain radiates to his neck and left arm. Troponin negative x 2, EKG is not concerning for ischemia. He was seen last month in the ED with the same symptoms, and has a stress test scheduled on 12/30/15.   He also reports feelings palpitations for which he was seen outpatient on April 4th, and is scheduled to have monitor placed next week to evaluate.    His last echo was in March 2014, and his EF was 55-60% with grade 1 DD.  No wall motion abnormalities. His last cath was in November 2014, he did not have any obstructive CAD.   He has chest pressure, with radiation to his neck.  He states his pain is somewhat relieved by Nitro.  Feels like his heart is fluttering, tele reviewed, no arrhythmias.   Past Medical History  Diagnosis Date  . B12 DEFICIENCY 03/06/2007  . CARDIOMYOPATHY, IDIOPATHIC HYPERTROPHIC 03/06/2007  . DEPRESSION 03/06/2007  . GERD 06/01/2007  . HYPERCHOLESTEROLEMIA 03/06/2007  . IMPAIRED GLUCOSE TOLERANCE 11/08/2008  . NEPHROLITHIASIS, HX OF 04/07/2010  . 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 OSTEOARTHRITIS 06/01/2007  . Palpitations 10/23/2007  . SYNDROME, CHRONIC PAIN 03/06/2007  . Cancer [facility])     prostate ca  . Prostate cancer ([place])   .  HYPERTENSION 03/06/2007    [name]  . Pneumonia     hx of  . Dysrhythmia     sees [name] for "palpitations"  . DVT (deep venous thrombosis) ([place])   . Anxiety   . Peripheral vascular disease (HCC) 03    dvt  . SLEEP APNEA 03/06/2007    uses vpap, last sleep study 2011, Dr. Alanson Puls, at [place] of breath     infreq  . History of kidney stones   . Anginal pain ([place])     s/p cardiac cath 07/19/13 showing NL coronaries, EF 50-55%  . Primary idiopathic hypertrophic cardiomyopathy [facility])      Past Surgical History  Procedure Laterality Date  . Decompression and fusion      cervicothoracic  . Partial nephrectomy Left     bx  . Cardiac catheterization    . Lumbar fusion    . Prostate surgery      robotic prostatectomy  . Penile prosthesis implant    . Tonsillectomy    . Leg skin lesion  biopsy / excision      left leg  . Eye surgery Bilateral     blepharoplasty  . Spinal cord stimulator insertion N/A 04/11/2014    Procedure: LUMBAR SPINAL CORD STIMULATOR INSERTION;  Surgeon: Newman Pies, MD;  Location: [place] NEURO ORS;  Service: Neurosurgery;  Laterality: N/A;  . Left heart catheterization with coronary angiogram N/A 07/19/2013    Procedure: LEFT HEART CATHETERIZATION  WITH CORONARY ANGIOGRAM;  Surgeon: Jolaine Artist, MD;  Location: [facility] CATH LAB;  Service: Cardiovascular;  Laterality: N/A;    Allergies  Allergen Reactions  . Adhesive [Tape] Other (See Comments)    Skin irritation  . Pollen Extract Other (See Comments)    Stuffy nose    I have reviewed the patient's current medications . amitriptyline  25 mg Oral QHS  . aspirin EC  325 mg Oral Daily  . atenolol  25 mg Oral BID  . atorvastatin  40 mg Oral q1800  . azaTHIOprine  200 mg Oral Daily  . buPROPion  300 mg Oral q morning - 10a  . cycloSPORINE  2 drop Both Eyes BID  . dicyclomine  10 mg Oral TID AC & HS  . DULoxetine  90 mg Oral Daily  . enoxaparin (LOVENOX) injection  50 mg  Subcutaneous Q24H  . erythromycin  1 application Both Eyes QHS  . gabapentin  900 mg Oral QID  . nitroGLYCERIN  0.5 inch Topical 4 times per day  . oxybutynin  5 mg Oral Daily  . pantoprazole  40 mg Oral Daily  . sodium chloride flush  3 mL Intravenous Q12H  . sodium chloride flush  3 mL Intravenous Q12H     sodium chloride, acetaminophen **OR** acetaminophen, HYDROmorphone (DILAUDID) injection, ondansetron **OR** ondansetron (ZOFRAN) IV, oxyCODONE, sodium chloride flush  Prior to Admission medications   Medication Sig Start Date End Date Taking? Authorizing Provider  albuterol (PROVENTIL HFA;VENTOLIN HFA) 108 (90 BASE) MCG/ACT inhaler Inhale 2 puffs into the lungs 2 (two) times daily as needed for wheezing or shortness of breath.    Yes Historical Provider, MD  amitriptyline (ELAVIL) 25 MG tablet Take 1 tablet (25 mg total) by mouth at bedtime. 09/12/12  Yes Marletta Lor, MD  atenolol (TENORMIN) 25 MG tablet TAKE 1 TABLET BY MOUTH TWICE DAILY 06/09/15  Yes Marletta Lor, MD  atorvastatin (LIPITOR) 40 MG tablet Take 40 mg by mouth daily.   Yes Historical Provider, MD  azaTHIOprine (IMURAN) 50 MG tablet Take 200 mg by mouth daily.   Yes Historical Provider, MD  buPROPion (WELLBUTRIN XL) 300 MG 24 hr tablet TAKE ONE TABLET BY MOUTH EVERY MORNING 11/07/15  Yes Marletta Lor, MD  cyanocobalamin (,VITAMIN B-12,) 1000 MCG/ML injection Inject 1,000 mcg into the muscle every 30 (thirty) days. On or about the 1st of each month   Yes Historical Provider, MD  cycloSPORINE (RESTASIS) 0.05 % ophthalmic emulsion Place 2 drops into both eyes 2 (two) times daily.    Yes Historical Provider, MD  dicyclomine (BENTYL) 10 MG capsule TAKE 1 CAPSULE BY MOUTH 4 TIMES DAILY 07/28/15  Yes Marletta Lor, MD  DULoxetine (CYMBALTA) 30 MG capsule TAKE ONE CAPSULE BY MOUTH DAILY Patient taking differently: TAKE ONE CAPSULE BY MOUTH DAILY WITH A 60 MG CAPSULE FOR A 90 MG DOSE 06/09/15  Yes Marletta Lor, MD  DULoxetine (CYMBALTA) 60 MG capsule TAKE ONE CAPSULE BY MOUTH DAILY Patient taking differently: TAKE ONE CAPSULE BY MOUTH DAILY WITH A 30 MG CAPSULE FOR A 90 MG DOSE 11/07/15  Yes Marletta Lor, MD  erythromycin ophthalmic ointment Place 1 application into both eyes at bedtime.  04/15/14  Yes Historical Provider, MD  gabapentin (NEURONTIN) 300 MG capsule TAKE 3 CAPSULES BY MOUTH FOUR TIMES DAILY 12/06/14  Yes Marletta Lor, MD  guaiFENesin (MUCINEX) 600 MG 12 hr tablet Take 600 mg by mouth 2 (two) times daily.  Yes Historical Provider, MD  lidocaine (XYLOCAINE) 5 % ointment Apply 1 application topically 2 (two) times daily.   Yes Historical Provider, MD  NEXIUM 40 MG capsule TAKE ONE CAPSULE BY MOUTH DAILY 10/09/14  Yes Marletta Lor, MD  oxybutynin (DITROPAN-XL) 5 MG 24 hr tablet Take 5 mg by mouth daily.  01/30/15  Yes Historical Provider, MD  oxyCODONE-acetaminophen (PERCOCET) 10-325 MG tablet Take 1 tablet by mouth See admin instructions. Take 1 tablet by mouth every morning and 1 tablet before 6pm   Yes Historical Provider, MD  Polyethyl Glycol-Propyl Glycol (SYSTANE PRESERVATIVE FREE OP) Place 1 drop into both eyes 2 (two) times daily.   Yes Historical Provider, MD  promethazine (PHENERGAN) 25 MG tablet Take 25 mg by mouth every 6 (six) hours as needed for nausea.    Yes Historical Provider, MD  triamcinolone (NASACORT AQ) 55 MCG/ACT AERO nasal inhaler Place 1 spray into both nostrils daily as needed (dry mouth).    Yes Historical Provider, MD  triamcinolone ointment (KENALOG) 0.1 % Apply 1 application topically 2 (two) times daily.   Yes Historical Provider, MD     Social History   Social History  . Marital Status: Married    Spouse Name: N/A  . Number of Children: N/A  . 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 Smoking status: Former Smoker -- 0.50 packs/day for 3 years    Types: Cigarettes     Quit date: 09/07/1983  . Smokeless tobacco: Never Used  . Alcohol Use: No  . Drug Use: No  . Sexual Activity: Yes   Other Topics Concern  . Not on file   Social History Narrative    No family status information on file.   Family History  Problem Relation Age of Onset  . Heart disease Neg Hx     family hx CHF  . Kidney disease Neg Hx     family hx   . Cancer Neg Hx     family hx prostate     ROS:  Full 14 point review of systems complete and found to be negative unless listed above.  Physical Exam: Blood pressure 136/76, pulse 63, temperature 98.1 F (36.7 C), temperature source Oral, resp. rate 16, height 5\' 9"  (1.753 m), weight 230 lb 4.8 oz (104.463 kg), SpO2 100 %.  General: Well developed, well nourished, male in no acute distress Head: Eyes PERRLA, No xanthomas.   Normocephalic and atraumatic, oropharynx without edema or exudate. Lungs: CTA Heart: HRRR S1 S2, no rub/gallop,no murmur. pulses are 2+ extrem.   Neck: No carotid bruits. No lymphadenopathy.  No JVD. Abdomen: Bowel sounds present, abdomen soft and non-tender without masses or hernias noted. Msk:  No spine or cva tenderness. No weakness, no joint deformities or effusions. Extremities: No clubbing or cyanosis.  No edema.  Neuro: Alert and oriented X 3. No focal deficits noted. Psych:  Good affect, responds appropriately Skin: No rashes or lesions noted.  Labs:   Lab Results  Component Value Date   WBC 2.9* 12/15/2015   HGB 13.2 12/15/2015   HCT 39.2 12/15/2015   MCV 86.9 12/15/2015   PLT 259 12/15/2015    Recent Labs Lab 12/15/15 0329  NA 139  K 3.5  CL 106  CO2 23  BUN 12  CREATININE 0.94  CALCIUM 9.0  GLUCOSE 102*     Recent Labs  12/15/15 0329  TROPONINI <0.03  Recent Labs  12/14/15 2024  TROPIPOC 0.00    Echo: March 2014 - Left ventricle: The cavity size was normal. There was mild focal basal hypertrophy of the septum. Systolic function was normal. The estimated  ejection fraction was in the range of 55% to 60%. Regional wall motion abnormalities cannot be excluded. Doppler parameters are consistent with abnormal left ventricular relaxation (grade 1 diastolic dysfunction). - Left atrium: The atrium was mildly dilated.  ECG: NSR  Radiology:  Dg Chest 2 View  12/14/2015  CLINICAL DATA:  Constant chest pain, worse with exertion. Dyspnea today. EXAM: CHEST  2 VIEW COMPARISON:  11/30/2015 FINDINGS: The heart size and mediastinal contours are within normal limits. Both lungs are clear. The visualized skeletal structures are unremarkable. Implanted spinal stimulator projects over the mid thoracic spinal canal. IMPRESSION: No active cardiopulmonary disease. Electronically Signed   By: Andreas Newport M.D.   On: 12/14/2015 21:10    ASSESSMENT AND PLAN:    Principal Problem:   Chest pain Active Problems:   NEUROSARCOIDOSIS   Essential hypertension   Sarcoid (HCC)   Obstructive sleep apnea of adult   Hyperlipidemia   1. Chest pain: Patient has chest pressure with radiation to his left arm and neck.  He does feel SOB.  He is currently a non smoker, not diabetic, his last LDL was 73.  His troponin has been negative x2.  Given that he is still having pain, would recommend doing treadmill stress test today.  It will be useful to assess for arrhythmias as he states that sometimes he can feel his heart fluttering.   Lipid panel pending for risk stratification.    2. Hypertrophic Cardiomyopathy: last echo was in 3/ 2014. This showed mild focal basal hypertrophy of the septum. Systolic function was normal. The estimated ejection fraction was in the range of 55% to 60%. Regional wall motion abnormalities could not be excluded. Doppler parameters were consistent with abnormal left ventricular relaxation (grade 1 diastolic dysfunction). Given it has been 3 years since this has been assessed, we will obtain a repeat echo outpatient to look for disease  progression. Will also assess EF and wall motion.    Signed: Arbutus Leas, NP 12/15/2015 8:04 AM Pager [phone] Patient seen and examined. I agree with the assessment and plan as detailed above. See also my additional thoughts below.   I reviewed all the information. I've spoken with the patient and his wife. I have reviewed all the data with John Sacramento, NP. The patient is stable today. Because he has had 2 hospitalizations for chest pain, I feel we should proceed with his nuclear study in the hospital. We know that he had a nuclear study in 2013 that did not show any significant abnormalities. Cardiac catheterization in 2014 revealed no significant coronary disease. Hopefully his nuclear study will be normal today. If it is normal he can go home. He can have his follow-up echo and 21 day monitor as an outpatient. Having him wear the monitor will be very important as he continues to describe palpitations. In the hospital he has not had any significant arrhythmias.  The plan will be stress nuclear study in the hospital today. If normal, home later today.   Dola Argyle, MD, [facility] 12/15/2015 10:44 AM

## 2015-12-15 NOTE — Progress Notes (Signed)
     The patient was seen in nuclear medicine for a lexiscan myoview. He tolerated the procedure well. No acute ST or TW changes on ECG. Await nuclear images.    Chantee Cerino Stern PA-C  [facility]    

## 2015-12-15 NOTE — Progress Notes (Signed)
Pt admitted after midnight. Please refer to admission note done 12/15/2015 50 y.o. male with a history of Sarcoidosis, Idiopathic HCOM, HTN, Hyperlipidemia who presented to [facility] ED with intermittent chest ain with associated symptoms of SOB, diizziness, nausea and palpitations for the past 2--3 days prior to this admission.Pain was pressure like and 8/10 in intensity.The pain was initially with exertion but has started to occur at rest.His initial cardiac workup has been negative.  Assessment and plan: Chest pain - Appreciate Cardiology recommendations - Plan for stress test this am  - Continue aspirin daily - Continue nitroglycerin q 6 hours   Leisa Lenz [facility] [phone]

## 2015-12-15 NOTE — Progress Notes (Signed)
Patient received from E.D. Via stretcher. Patient alert, orientedx4, and  verbal. Vital signs stable. Patient denies any pain or discomfort. Nitro paste in place to left anterior chest. Patient oriented to room, call bell in reach. Will continue to monitor.

## 2015-12-15 NOTE — ED Notes (Signed)
Rating pain at 7/10 after first ntg.

## 2015-12-15 NOTE — Progress Notes (Signed)
I have reviewed the patient's records early this morning. He does not need to be nothing by mouth. He will be seen more completely later this morning with a complete note to follow.  Daryel November, MD

## 2015-12-15 NOTE — Progress Notes (Signed)
Placed patient on CPAP for the night.  Patient is tolerating well at this time. 

## 2015-12-15 NOTE — H&P (Addendum)
[facility] Admission History and Physical       [name]. [id number] DOB: 05/18/1966 DOA: 12/14/2015  Referring physician: EDP PCP: John Cowden, MD  Specialists:   Chief Complaint: Chest Pain  HPI: [name]. is a 50 y.o. male with a history of Sarcoidosis, Idiopathic HCOM, HTN, Hyperlipidemia who present to the ED with complaints of intermittent Chest Pain with associated symptoms of SOB, Dizziness, and Nausea and Palpitations for the past 2--3 days.   He describes the pain as pressure like, and rate the pain at a 8/10.   The pain was initially occurring with exertion but has started to occur at rest.   He was evaluated in the ED, and his initial cardiac workup has been negative.   He was referred for further evaluation.       Review of Systems:  Constitutional: No Weight Loss, No Weight Gain, Night Sweats, Fevers, Chills, +Dizziness, Light Headedness, Fatigue, or Generalized Weakness HEENT: No Headaches, Difficulty Swallowing,Tooth/Dental Problems,Sore Throat,  No Sneezing, Rhinitis, Ear Ache, Nasal Congestion, or Post Nasal Drip,  Cardio-vascular:  +Chest pain, Orthopnea, PND, Edema in Lower Extremities, Anasarca, Dizziness, +Palpitations  Resp:  +Dyspnea, No DOE, No Productive Cough, No Non-Productive Cough, No Hemoptysis, No Wheezing.    GI: No Heartburn, Indigestion, Abdominal Pain, Nausea, Vomiting, Diarrhea, Constipation, Hematemesis, Hematochezia, Melena, Change in Bowel Habits,  Loss of Appetite  GU: No Dysuria, No Change in Color of Urine, No Urgency or Urinary Frequency, No Flank pain.  Musculoskeletal: No Joint Pain or Swelling, No Decreased Range of Motion, No Back Pain.  Neurologic: No Syncope, No Seizures, Muscle Weakness, Paresthesia, Vision Disturbance or Loss, No Diplopia, No Vertigo, No Difficulty Walking,  Skin: No Rash or Lesions. Psych: No Change in Mood or Affect, No Depression or Anxiety, No Memory loss, No Confusion, or  Hallucinations   Past Medical History  Diagnosis Date  . B12 DEFICIENCY 03/06/2007  . CARDIOMYOPATHY, IDIOPATHIC HYPERTROPHIC 03/06/2007  . DEPRESSION 03/06/2007  . GERD 06/01/2007  . HYPERCHOLESTEROLEMIA 03/06/2007  . IMPAIRED GLUCOSE TOLERANCE 11/08/2008  . NEPHROLITHIASIS, HX OF 04/07/2010  . 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 OSTEOARTHRITIS 06/01/2007  . Palpitations 10/23/2007  . SYNDROME, CHRONIC PAIN 03/06/2007  . Cancer [facility])     prostate ca  . Prostate cancer ([place])   . HYPERTENSION 03/06/2007    [name]  . Pneumonia     hx of  . Dysrhythmia     sees [name] for "palpitations"  . DVT (deep venous thrombosis) ([place])   . Anxiety   . Peripheral vascular disease (HCC) 03    dvt  . SLEEP APNEA 03/06/2007    uses vpap, last sleep study 2011, Dr. Alanson Puls, at [place] of breath     infreq  . History of kidney stones   . Anginal pain ([place])     s/p cardiac cath 07/19/13 showing NL coronaries, EF 50-55%  . Primary idiopathic hypertrophic cardiomyopathy [facility])      Past Surgical History  Procedure Laterality Date  . Decompression and fusion      cervicothoracic  . Partial nephrectomy Left     bx  . Cardiac catheterization    . Lumbar fusion    . Prostate surgery      robotic prostatectomy  . Penile prosthesis implant    . Tonsillectomy    . Leg skin lesion  biopsy / excision      left leg  . Eye surgery Bilateral  blepharoplasty  . Spinal cord stimulator insertion N/A 04/11/2014    Procedure: LUMBAR SPINAL CORD STIMULATOR INSERTION;  Surgeon: Newman Pies, MD;  Location: [place] [facility]UR[facility] ORS;  Service: Neurosurgery;  Laterality: N/A;  . Left heart catheterization with coronary angiogram N/A 07/19/2013    Procedure: LEFT HEART CATHETERIZATION WITH CORONARY ANGIOGRAM;  Surgeon: Jolaine Artist, MD;  Location: [facility] CATH LAB;  Service: Cardiovascular;  Laterality: N/A;      Prior to Admission medications   Medication Sig Start Date End Date Taking?  Authorizing Provider  albuterol (PROVENTIL HFA;VENTOLIN HFA) 108 (90 BASE) MCG/ACT inhaler Inhale 2 puffs into the lungs 2 (two) times daily as needed for wheezing or shortness of breath.    Yes Historical Provider, MD  amitriptyline (ELAVIL) 25 MG tablet Take 1 tablet (25 mg total) by mouth at bedtime. 09/12/12  Yes Marletta Lor, MD  atenolol (TENORMIN) 25 MG tablet TAKE 1 TABLET BY MOUTH TWICE DAILY 06/09/15  Yes Marletta Lor, MD  atorvastatin (LIPITOR) 40 MG tablet Take 40 mg by mouth daily.   Yes Historical Provider, MD  azaTHIOprine (IMURAN) 50 MG tablet Take 200 mg by mouth daily.   Yes Historical Provider, MD  buPROPion (WELLBUTRIN XL) 300 MG 24 hr tablet TAKE ONE TABLET BY MOUTH EVERY MORNING 11/07/15  Yes Marletta Lor, MD  cyanocobalamin (,VITAMIN B-12,) 1000 MCG/ML injection Inject 1,000 mcg into the muscle every 30 (thirty) days. On or about the 1st of each month   Yes Historical Provider, MD  cycloSPORINE (RESTASIS) 0.05 % ophthalmic emulsion Place 2 drops into both eyes 2 (two) times daily.    Yes Historical Provider, MD  dicyclomine (BENTYL) 10 MG capsule TAKE 1 CAPSULE BY MOUTH 4 TIMES DAILY 07/28/15  Yes Marletta Lor, MD  DULoxetine (CYMBALTA) 30 MG capsule TAKE ONE CAPSULE BY MOUTH DAILY Patient taking differently: TAKE ONE CAPSULE BY MOUTH DAILY WITH A 60 MG CAPSULE FOR A 90 MG DOSE 06/09/15  Yes Marletta Lor, MD  DULoxetine (CYMBALTA) 60 MG capsule TAKE ONE CAPSULE BY MOUTH DAILY Patient taking differently: TAKE ONE CAPSULE BY MOUTH DAILY WITH A 30 MG CAPSULE FOR A 90 MG DOSE 11/07/15  Yes Marletta Lor, MD  erythromycin ophthalmic ointment Place 1 application into both eyes at bedtime.  04/15/14  Yes Historical Provider, MD  gabapentin (NEURONTIN) 300 MG capsule TAKE 3 CAPSULES BY MOUTH FOUR TIMES DAILY 12/06/14  Yes Marletta Lor, MD  guaiFENesin (MUCINEX) 600 MG 12 hr tablet Take 600 mg by mouth 2 (two) times daily.    Yes Historical Provider,  MD  lidocaine (XYLOCAINE) 5 % ointment Apply 1 application topically 2 (two) times daily.   Yes Historical Provider, MD  NEXIUM 40 MG capsule TAKE ONE CAPSULE BY MOUTH DAILY 10/09/14  Yes Marletta Lor, MD  oxybutynin (DITROPAN-XL) 5 MG 24 hr tablet Take 5 mg by mouth daily.  01/30/15  Yes Historical Provider, MD  oxyCODONE-acetaminophen (PERCOCET) 10-325 MG tablet Take 1 tablet by mouth See admin instructions. Take 1 tablet by mouth every morning and 1 tablet before 6pm   Yes Historical Provider, MD  Polyethyl Glycol-Propyl Glycol (SYSTANE PRESERVATIVE FREE OP) Place 1 drop into both eyes 2 (two) times daily.   Yes Historical Provider, MD  promethazine (PHENERGAN) 25 MG tablet Take 25 mg by mouth every 6 (six) hours as needed for nausea.    Yes Historical Provider, MD  triamcinolone (NASACORT AQ) 55 MCG/ACT AERO nasal inhaler Place 1 spray into  both nostrils daily as needed (dry mouth).    Yes Historical Provider, MD  triamcinolone ointment (KENALOG) 0.1 % Apply 1 application topically 2 (two) times daily.   Yes Historical Provider, MD     Allergies  Allergen Reactions  . Adhesive [Tape] Other (See Comments)    Skin irritation  . Pollen Extract Other (See Comments)    Stuffy nose    Social History:  reports that he quit smoking about 32 years ago. His smoking use included Cigarettes. He has a 1.5 pack-year smoking history. He has never used smokeless tobacco. He reports that he does not drink alcohol or use illicit drugs.    Family History  Problem Relation Age of Onset  . Heart disease Neg Hx     family hx CHF  . Kidney disease Neg Hx     family hx   . Cancer Neg Hx     family hx prostate       Physical Exam:  GEN:  Pleasant  50 y.o. male examined and in no acute distress; cooperative with exam Filed Vitals:   12/14/15 2230 12/14/15 2245 12/14/15 2253 12/14/15 2300  BP: 127/79 120/83  131/85  Pulse: 67 73  67  Temp:      TempSrc:      Resp: 15 20  10   Height:        Weight:      SpO2: 97% 96% 99% 98%   Blood pressure 131/85, pulse 67, temperature 98.5 F (36.9 C), temperature source Oral, resp. rate 10, height 5\' 9"  (1.753 m), weight 105.235 kg (232 lb), SpO2 98 %. PSYCH: He is alert and oriented x4; does not appear anxious does not appear depressed; affect is normal HEENT: Normocephalic and Atraumatic, Mucous membranes pink; PERRLA; EOM intact; Fundi:  Benign;  No scleral icterus, Nares: Patent, Oropharynx: Clear, Edentulous or Fair Dentition,    Neck:  FROM, No Cervical Lymphadenopathy nor Thyromegaly or Carotid Bruit; No JVD; Breasts:: Not examined CHEST WALL: No tenderness CHEST: Normal respiration, clear to auscultation bilaterally HEART: Regular rate and rhythm; no murmurs rubs or gallops BACK: No kyphosis or scoliosis; No CVA tenderness ABDOMEN: Positive Bowel Sounds, Scaphoid, Obese, Soft Non-Tender, No Rebound or Guarding; No Masses, No Organomegaly, No Pannus; No Intertriginous candida. Rectal Exam: Not done EXTREMITIES: No Cyanosis, Clubbing, or Edema; No Ulcerations. Genitalia: not examined PULSES: 2+ and symmetric SKIN: Normal hydration no rash or ulceration CNS:  Alert and Oriented x 4, No Focal Deficits Vascular: pulses palpable throughout    Labs on Admission:  Basic Metabolic Panel:  Recent Labs Lab 12/14/15 2015  NA 139  K 3.7  CL 109  CO2 21*  GLUCOSE 111*  BUN 11  CREATININE 1.02  CALCIUM 9.2   Liver Function Tests: No results for input(s): AST, ALT, ALKPHOS, BILITOT, PROT, ALBUMIN in the last 168 hours. No results for input(s): LIPASE, AMYLASE in the last 168 hours. No results for input(s): AMMONIA in the last 168 hours. CBC:  Recent Labs Lab 12/14/15 2015  WBC 3.6*  HGB 14.1  HCT 41.3  MCV 86.6  PLT 318   Cardiac Enzymes: No results for input(s): CKTOTAL, CKMB, CKMBINDEX, TROPONINI in the last 168 hours.  BNP (last 3 results) No results for input(s): BNP in the last 8760 hours.  ProBNP (last 3  results) No results for input(s): PROBNP in the last 8760 hours.  CBG: No results for input(s): GLUCAP in the last 168 hours.  Radiological Exams on Admission: Dg Chest  2 View  12/14/2015  CLINICAL DATA:  Constant chest pain, worse with exertion. Dyspnea today. EXAM: CHEST  2 VIEW COMPARISON:  11/30/2015 FINDINGS: The heart size and mediastinal contours are within normal limits. Both lungs are clear. The visualized skeletal structures are unremarkable. Implanted spinal stimulator projects over the mid thoracic spinal canal. IMPRESSION: No active cardiopulmonary disease. Electronically Signed   By: Andreas Newport M.D.   On: 12/14/2015 21:10     EKG: Independently reviewed. Normal Sinus Rhythm at 92 No Acute S-T changes   Assessment/Plan:      50 y.o. male with  Principal Problem:    Chest pain   Cardiac Monitoring   Cycle Troponins   Nitropaste, O2, ASA    Continue Atenolol, and Atorvastatin   2D ECHO in AM   Check Lipids   Consult Cards in AM  for Stress Testing     Active Problems:    Essential hypertension   Continue Atenolol   Monitor BPs      Hyperlipidemia   Continue Atorvastatin    NEUROSARCOIDOSIS/Sarcoid (HCC)   Continue Azathioprine   Continue Gabapentin   Chronic pain   On Gabapentin and Oxycodone         Obstructive sleep apnea of adult   CPAP      DVT Prophylaxis   Lovenox     Code Status:     FULL CODE       Family Communication:   No Family Present    Disposition Plan:    Observation Status        Time spent: 65 Minutes      Theressa Millard [facility] Pager [phone]   If 7AM -7PM Please Contact the Day Rounding Team MD for [facility]  If 7PM-7AM, Please Contact Night-Floor Coverage  [email or web address] Password [email or web address] 12/15/2015, 1:11 AM     ADDENDUM:   Patient was seen and examined on 12/15/2015

## 2015-12-16 ENCOUNTER — Other Ambulatory Visit: Payer: Self-pay | Admitting: Cardiology

## 2015-12-16 DIAGNOSIS — R0789 Other chest pain: Secondary | ICD-10-CM

## 2015-12-16 DIAGNOSIS — D8689 Sarcoidosis of other sites: Secondary | ICD-10-CM | POA: Diagnosis not present

## 2015-12-16 DIAGNOSIS — I10 Essential (primary) hypertension: Secondary | ICD-10-CM

## 2015-12-16 DIAGNOSIS — I422 Other hypertrophic cardiomyopathy: Secondary | ICD-10-CM

## 2015-12-16 DIAGNOSIS — R072 Precordial pain: Secondary | ICD-10-CM | POA: Diagnosis not present

## 2015-12-16 LAB — LIPID PANEL
Cholesterol: 144 mg/dL (ref 0–200)
HDL: 39 mg/dL — ABNORMAL LOW (ref >40)
LDL Cholesterol: 88 mg/dL (ref 0–99)
Total CHOL/HDL Ratio: 3.7 RATIO
Triglycerides: 84 mg/dL (ref <150)
VLDL: 17 mg/dL (ref 0–40)

## 2015-12-16 NOTE — Progress Notes (Signed)
Patient Name: [name]. Date of Encounter: 12/16/2015  Principal Problem:   Chest pain Active Problems:   NEUROSARCOIDOSIS   Essential hypertension   Sarcoid (HCC)   Obstructive sleep apnea of adult   Hyperlipidemia   Precordial chest pain   Primary Cardiologist: [name] Patient Profile: [name] is a 50 y/o male with a past medical history of pulmonary and neurosarcoidosis, for which he is followed at [facility]. He has a hx of idiopathic hypertrophic cardiomyopathy, depression, HLD, GERD, HTN, and glucose intolerance. He has intermittent chest pain for 2-3 days. Had Lexiscan Myoview on 12/15/15, low risk study. No ischemia.  He continues to have some mild residual chest discomfort with radiation to his left neck. Etiology is not clear. There is no evidence that this is cardiac in origin at this time.   SUBJECTIVE: Reports having chest pressure "all night" with radiation to the left side of his neck.  No SOB, no palpitations, no diaphoresis.    OBJECTIVE Filed Vitals:   12/15/15 1411 12/15/15 2032 12/15/15 2321 12/16/15 0421  BP: 126/70 115/69  104/65  Pulse: 97 79 68   Temp: 98.7 F (37.1 C) 97.8 F (36.6 C)  98.2 F (36.8 C)  TempSrc: Oral Oral  Oral  Resp: 18 18 18 16   Height:      Weight:    229 lb 12.8 oz (104.237 kg)  SpO2:  95% 98% 97%    Intake/Output Summary (Last 24 hours) at 12/16/15 0729 Last data filed at 12/15/15 2200  Gross per 24 hour  Intake    480 ml  Output      0 ml  Net    480 ml   Filed Weights   12/14/15 2003 12/15/15 0245 12/16/15 0421  Weight: 232 lb (105.235 kg) 230 lb 4.8 oz (104.463 kg) 229 lb 12.8 oz (104.237 kg)    PHYSICAL EXAM General: Well developed, well nourished, male in no acute distress. Head: Normocephalic, atraumatic.  Neck: Supple without bruits, No JVD. Lungs:  Resp regular and unlabored, CTA. Heart: RRR, S1, S2, no S3, S4, or murmur; no rub. Abdomen: Soft, non-tender, non-distended, BS  + x 4.  Extremities: No clubbing, cyanosis,No edema.  Neuro: Alert and oriented X 3. Moves all extremities spontaneously. Psych: Normal affect.  LABS: CBC: Recent Labs  12/14/15 2015 12/15/15 0329  WBC 3.6* 2.9*  HGB 14.1 13.2  HCT 41.3 39.2  MCV 86.6 86.9  PLT 318 [id number]   Basic Metabolic Panel: Recent Labs  12/14/15 2015 12/15/15 0329  NA 139 139  K 3.7 3.5  CL 109 106  CO2 21* 23  GLUCOSE 111* 102*  BUN 11 12  CREATININE 1.02 0.94  CALCIUM 9.2 9.0   Cardiac Enzymes: Recent Labs  12/15/15 0329 12/15/15 1004 12/15/15 1454  TROPONINI <0.03 <0.03 <0.03    Recent Labs  12/14/15 2024  TROPIPOC 0.00   Fasting Lipid Panel: Recent Labs  12/16/15 0355  CHOL 144  HDL 39*  LDLCALC 88  TRIG 84  CHOLHDL 3.7    Current facility-administered medications:  .  0.9 %  sodium chloride infusion, 250 mL, Intravenous, PRN, Theressa Millard, MD .  acetaminophen (TYLENOL) tablet 650 mg, 650 mg, Oral, Q6H PRN **OR** acetaminophen (TYLENOL) suppository 650 mg, 650 mg, Rectal, Q6H PRN, Theressa Millard, MD .  amitriptyline (ELAVIL) tablet 25 mg, 25 mg, Oral, QHS, Theressa Millard, MD, 25 mg at 12/15/15 2154 .  aspirin EC tablet 325  mg, 325 mg, Oral, Daily, Theressa Millard, MD, 325 mg at 12/15/15 0900 .  atenolol (TENORMIN) tablet 25 mg, 25 mg, Oral, BID, Theressa Millard, MD, 25 mg at 12/15/15 2154 .  atorvastatin (LIPITOR) tablet 40 mg, 40 mg, Oral, q1800, Theressa Millard, MD, 40 mg at 12/15/15 1842 .  azaTHIOprine (IMURAN) tablet 200 mg, 200 mg, Oral, Daily, Theressa Millard, MD, 200 mg at 12/15/15 0902 .  buPROPion (WELLBUTRIN XL) 24 hr tablet 300 mg, 300 mg, Oral, q morning - 10a, Theressa Millard, MD, 300 mg at 12/15/15 0901 .  cycloSPORINE (RESTASIS) 0.05 % ophthalmic emulsion 2 drop, 2 drop, Both Eyes, BID, Theressa Millard, MD, 2 drop at 12/15/15 2154 .  dicyclomine (BENTYL) capsule 10 mg, 10 mg, Oral, TID AC & HS, Theressa Millard, MD, 10 mg at  12/16/15 0607 .  DULoxetine (CYMBALTA) DR capsule 90 mg, 90 mg, Oral, Daily, Theressa Millard, MD, 90 mg at 12/15/15 0901 .  enoxaparin (LOVENOX) injection 50 mg, 50 mg, Subcutaneous, Q24H, Theressa Millard, MD, 50 mg at 12/15/15 0903 .  erythromycin ophthalmic ointment 1 application, 1 application, Both Eyes, QHS, Theressa Millard, MD, 1 application at [id number] .  gabapentin (NEURONTIN) capsule 900 mg, 900 mg, Oral, QID, Theressa Millard, MD, 900 mg at 12/15/15 2153 .  HYDROmorphone (DILAUDID) injection 0.5-1 mg, 0.5-1 mg, Intravenous, Q3H PRN, Theressa Millard, MD, 1 mg at 12/15/15 1104 .  nitroGLYCERIN (NITROGLYN) 2 % ointment 0.5 inch, 0.5 inch, Topical, 4 times per day, Theressa Millard, MD, 0.5 inch at 12/16/15 0607 .  ondansetron (ZOFRAN) tablet 4 mg, 4 mg, Oral, Q6H PRN **OR** ondansetron (ZOFRAN) injection 4 mg, 4 mg, Intravenous, Q6H PRN, Theressa Millard, MD .  oxybutynin (DITROPAN-XL) 24 hr tablet 5 mg, 5 mg, Oral, Daily, Theressa Millard, MD, 5 mg at 12/15/15 0902 .  oxyCODONE (Oxy IR/ROXICODONE) immediate release tablet 10 mg, 10 mg, Oral, Q4H PRN, Theressa Millard, MD, 10 mg at 12/15/15 2020 .  pantoprazole (PROTONIX) EC tablet 40 mg, 40 mg, Oral, Daily, Theressa Millard, MD, 40 mg at 12/15/15 0902 .  sodium chloride flush (NS) 0.9 % injection 3 mL, 3 mL, Intravenous, Q12H, Theressa Millard, MD, 3 mL at 12/15/15 2200 .  sodium chloride flush (NS) 0.9 % injection 3 mL, 3 mL, Intravenous, Q12H, Theressa Millard, MD, 3 mL at 12/15/15 1000 .  sodium chloride flush (NS) 0.9 % injection 3 mL, 3 mL, Intravenous, PRN, Theressa Millard, MD    TELE: NSR        Radiology/Studies: Dg Chest 2 View  12/14/2015  CLINICAL DATA:  Constant chest pain, worse with exertion. Dyspnea today. EXAM: CHEST  2 VIEW COMPARISON:  11/30/2015 FINDINGS: The heart size and mediastinal contours are within normal limits. Both lungs are clear. The visualized skeletal structures are  unremarkable. Implanted spinal stimulator projects over the mid thoracic spinal canal. IMPRESSION: No active cardiopulmonary disease. Electronically Signed   By: Andreas Newport M.D.   On: 12/14/2015 21:10   Nm Myocar Multi W/spect W/wall Motion / Ef  12/15/2015   There was no ST segment deviation noted during stress.  The study is normal.  This is a low risk study.  Nuclear stress EF: 68%.      Current Medications:  . amitriptyline  25 mg Oral QHS  . aspirin EC  325 mg Oral Daily  . atenolol  25 mg Oral BID  .  atorvastatin  40 mg Oral q1800  . azaTHIOprine  200 mg Oral Daily  . buPROPion  300 mg Oral q morning - 10a  . cycloSPORINE  2 drop Both Eyes BID  . dicyclomine  10 mg Oral TID AC & HS  . DULoxetine  90 mg Oral Daily  . enoxaparin (LOVENOX) injection  50 mg Subcutaneous Q24H  . erythromycin  1 application Both Eyes QHS  . gabapentin  900 mg Oral QID  . nitroGLYCERIN  0.5 inch Topical 4 times per day  . oxybutynin  5 mg Oral Daily  . pantoprazole  40 mg Oral Daily  . sodium chloride flush  3 mL Intravenous Q12H  . sodium chloride flush  3 mL Intravenous Q12H      ASSESSMENT AND PLAN:  1.Atypical Chest pain: Patient states that he had intermittent chest pressure all night last night. Pain radiates to his left neck.  This is the same pain that brought him to the hospital. His nuclear study this admission showed low risk. EF was 68%. Troponin negative x 3.  There is no evidence of ischemia. At this point I feel that the patient's chest discomfort is not cardiac in origin. No further cardiology workup is recommended during this admission. The etiology of his current discomfort is not clear. I wonder if he could possibly have cervical disc disease causing some of his problems. I will talk with his primary physicians about this. In addition, he has a diagnosis of neuro sarcoidosis. I do not have experience with this diagnosis. I do not know if it can cause any type of pain  syndromes. It would be very helpful to arrange for the patient to be seen by his sarcoid doctor at [facility] with our records carefully communicated to them.  2. Hypertrophic Cardiomyopathy: last echo was in 3/ 2014. This showed mild focal basal hypertrophy of the septum. Systolic function was normal. The estimated ejection fraction was in the range of 55% to 60%. Regional wall motion abnormalities could not be excluded. Doppler parameters were consistent with abnormal left ventricular relaxation (grade 1 diastolic dysfunction). We will obtain a repeat echo outpatient to look for disease progression. Will also assess EF and wall motion.   Signed, Arbutus Leas , NP 7:29 AM 12/16/2015 Pager [phone] Patient seen and examined. I agree with the assessment and plan as detailed above. See also my additional thoughts below.   I have spoken with the patient and his wife today. I have adjusted the assessment and plan above. The findings represent my opinion. I have also spoken with the patient's primary physician during this hospitalization.  Dola Argyle, MD, [facility] 12/16/2015 10:41 AM

## 2015-12-16 NOTE — Care Management Note (Signed)
Case Management Note  Patient Details  Name: [name]. MRN: [id number] Date of Birth: 07/10/1966  Subjective/Objective:    Chest pain, hx sarcoidosis       Action/Plan: Discharge Planning: AVS reviewed:  Chart reviewed. No NCM needs identified. Pt has insurance to cover medications.  PCP- Marletta Lor MD   Expected Discharge Date:  12/16/2015               Expected Discharge Plan:  Home/Self Care  In-House Referral:  NA  Discharge planning Services  CM Consult  Post Acute Care Choice:  NA Choice offered to:  NA  DME Arranged:  N/A DME Agency:     HH Arranged:  NA HH Agency:  NA  Status of Service:  Completed, signed off  Medicare Important Message Given:    Date Medicare IM Given:    Medicare IM give by:    Date Additional Medicare IM Given:    Additional Medicare Important Message give by:     If discussed at [place] of Stay Meetings, dates discussed:    Additional Comments:  Erenest Rasher, RN 12/16/2015, 11:33 AM

## 2015-12-16 NOTE — Progress Notes (Signed)
Pt discharged home with family member.  Reviewed discharge instructions and education, all questions answered.  Assessment unchanged from earlier.

## 2015-12-16 NOTE — Plan of Care (Signed)
Problem: Consults Goal: Chest Pain Patient Education (See Patient Education module for education specifics.)  Outcome: Completed/Met Date Met:  12/16/15 Pt received education regarding chest pain during hospitalization   Problem: Phase I Progression Outcomes Goal: Anginal pain relieved Outcome: Completed/Met Date Met:  12/16/15 Pt remains free from chest pain   Problem: Education: Goal: Knowledge of [place] [facility] Education information/materials will improve Outcome: Completed/Met Date Met:  12/16/15 Pt received education on tests, procedures, medications, pain rating, and [place] values while hospitalized   Problem: Safety: Goal: Ability to remain free from injury will improve Outcome: Completed/Met Date Met:  12/16/15 Pt remained free from injury during this admission      

## 2015-12-16 NOTE — Discharge Summary (Signed)
Physician Discharge Summary  [name]. [id number] DOB: May 10, 1966 DOA: 12/14/2015  PCP: Nyoka Cowden, MD  Admit date: 12/14/2015 Discharge date: 12/16/2015  Recommendations for Outpatient Follow-up:  1. F/U with PCP per scheduled appt 2. No changes in medications and discharge  Discharge Diagnoses:  Principal Problem:   Chest pain Active Problems:   NEUROSARCOIDOSIS   Essential hypertension   Sarcoid (HCC)   Obstructive sleep apnea of adult   Hyperlipidemia   Precordial chest pain    Discharge Condition: stable   Diet recommendation: as tolerated   History of present illness:  50 y.o. male with a history of Sarcoidosis, Idiopathic HCOM, HTN, Hyperlipidemia who presented to [facility] ED with intermittent chest ain with associated symptoms of SOB, diizziness, nausea and palpitations for the past 2--3 days prior to this admission.Pain was pressure like and 8/10 in intensity.The pain was initially with exertion but has started to occur at rest.His initial cardiac workup has been negative.   Hospital Course:   Principal Problem:   Chest pain - Troponin levels within normal limit - Unclear etiology at this point. Patient underwent stress test with no signs of ischemia - Per cardiology, and over further workup required during this hospital stay - Continue current medications and discharge  Active Problems:   NEUROSARCOIDOSIS - Follow up in bed this hospital    Essential hypertension - Continue atenolol   Signed:  Leisa Lenz, MD  [facility] 12/16/2015, 11:23 AM  Pager #: [phone]  Time spent in minutes: less than 30 minutes   Discharge Exam: Filed Vitals:   12/15/15 2321 12/16/15 0421  BP:  104/65  Pulse: 68   Temp:  98.2 F (36.8 C)  Resp: 18 16   Filed Vitals:   12/15/15 1411 12/15/15 2032 12/15/15 2321 12/16/15 0421  BP: 126/70 115/69  104/65  Pulse: 97 79 68   Temp: 98.7 F (37.1 C) 97.8 F (36.6 C)  98.2 F (36.8 C)   TempSrc: Oral Oral  Oral  Resp: 18 18 18 16   Height:      Weight:    104.237 kg (229 lb 12.8 oz)  SpO2:  95% 98% 97%    General: Pt is alert, follows commands appropriately, not in acute distress Cardiovascular: Regular rate and rhythm, S1/S2 +, no murmurs Respiratory: Clear to auscultation bilaterally, no wheezing, no crackles, no rhonchi Abdominal: Soft, non tender, non distended, bowel sounds +, no guarding Extremities: no edema, no cyanosis, pulses palpable bilaterally DP and PT Neuro: Grossly nonfocal  Discharge Instructions  Discharge Instructions    Call MD for:  difficulty breathing, headache or visual disturbances    Complete by:  As directed      Call MD for:  persistant dizziness or light-headedness    Complete by:  As directed      Call MD for:  persistant nausea and vomiting    Complete by:  As directed      Call MD for:  severe uncontrolled pain    Complete by:  As directed      Diet - low sodium heart healthy    Complete by:  As directed      Increase activity slowly    Complete by:  As directed             Medication List    TAKE these medications        albuterol 108 (90 Base) MCG/ACT inhaler  Commonly known as:  PROVENTIL HFA;VENTOLIN HFA  Inhale 2 puffs into  the lungs 2 (two) times daily as needed for wheezing or shortness of breath.     amitriptyline 25 MG tablet  Commonly known as:  ELAVIL  Take 1 tablet (25 mg total) by mouth at bedtime.     atenolol 25 MG tablet  Commonly known as:  TENORMIN  TAKE 1 TABLET BY MOUTH TWICE DAILY     atorvastatin 40 MG tablet  Commonly known as:  LIPITOR  Take 40 mg by mouth daily.     azaTHIOprine 50 MG tablet  Commonly known as:  IMURAN  Take 200 mg by mouth daily.     buPROPion 300 MG 24 hr tablet  Commonly known as:  WELLBUTRIN XL  TAKE ONE TABLET BY MOUTH EVERY MORNING     cyanocobalamin 1000 MCG/ML injection  Commonly known as:  (VITAMIN B-12)  Inject 1,000 mcg into the muscle every 30  (thirty) days. On or about the 1st of each month     cycloSPORINE 0.05 % ophthalmic emulsion  Commonly known as:  RESTASIS  Place 2 drops into both eyes 2 (two) times daily.     dicyclomine 10 MG capsule  Commonly known as:  BENTYL  TAKE 1 CAPSULE BY MOUTH 4 TIMES DAILY     DULoxetine 30 MG capsule  Commonly known as:  CYMBALTA  TAKE ONE CAPSULE BY MOUTH DAILY     DULoxetine 60 MG capsule  Commonly known as:  CYMBALTA  TAKE ONE CAPSULE BY MOUTH DAILY     erythromycin ophthalmic ointment  Place 1 application into both eyes at bedtime.     gabapentin 300 MG capsule  Commonly known as:  NEURONTIN  TAKE 3 CAPSULES BY MOUTH FOUR TIMES DAILY     guaiFENesin 600 MG 12 hr tablet  Commonly known as:  MUCINEX  Take 600 mg by mouth 2 (two) times daily.     lidocaine 5 % ointment  Commonly known as:  XYLOCAINE  Apply 1 application topically 2 (two) times daily.     NASACORT AQ 55 MCG/ACT Aero nasal inhaler  Generic drug:  triamcinolone  Place 1 spray into both nostrils daily as needed (dry mouth).     NEXIUM 40 MG capsule  Generic drug:  esomeprazole  TAKE ONE CAPSULE BY MOUTH DAILY     oxybutynin 5 MG 24 hr tablet  Commonly known as:  DITROPAN-XL  Take 5 mg by mouth daily.     oxyCODONE-acetaminophen 10-325 MG tablet  Commonly known as:  PERCOCET  Take 1 tablet by mouth See admin instructions. Take 1 tablet by mouth every morning and 1 tablet before 6pm     promethazine 25 MG tablet  Commonly known as:  PHENERGAN  Take 25 mg by mouth every 6 (six) hours as needed for nausea.     SYSTANE PRESERVATIVE FREE OP  Place 1 drop into both eyes 2 (two) times daily.     triamcinolone ointment 0.1 %  Commonly known as:  KENALOG  Apply 1 application topically 2 (two) times daily.           Follow-up Information    Follow up with HAGER, BRYAN, PA-C On 12/30/2015.   Specialties:  Physician Assistant, Radiology, Interventional Cardiology   Why:  at [facility]  information:   [address] [place] [address] [phone]       Follow up with [facility] On 12/23/2015.   Specialty:  Cardiology   Why:  at 10am  to set up event monitor   Contact information:   [address] [place]      Follow up with [facility] On 12/30/2015.   Specialty:  Cardiology   Why:  at 8:30am for echo    Contact information:   [address], [place] [phone]      Follow up with Nyoka Cowden, MD. Schedule an appointment as soon as possible for a visit in 1 week.   Specialty:  Internal Medicine   Why:  Follow up appt after recent hospitalization   Contact information:   [place] [address] [phone]        The results of significant diagnostics from this hospitalization (including imaging, microbiology, ancillary and laboratory) are listed below for reference.    Significant Diagnostic Studies: Dg Chest 2 View  12/14/2015  CLINICAL DATA:  Constant chest pain, worse with exertion. Dyspnea today. EXAM: CHEST  2 VIEW COMPARISON:  11/30/2015 FINDINGS: The heart size and mediastinal contours are within normal limits. Both lungs are clear. The visualized skeletal structures are unremarkable. Implanted spinal stimulator projects over the mid thoracic spinal canal. IMPRESSION: No active cardiopulmonary disease. Electronically Signed   By: Andreas Newport M.D.   On: 12/14/2015 21:10   Dg Chest 2 View  11/30/2015  CLINICAL DATA:  2 days patient has had left sided chest and left arm pain, SOB. HX HTN, cancer prostate, pneumonia,DVT, cardiac catheterization EXAM: CHEST  2 VIEW COMPARISON:  10/15/2014 FINDINGS: Spinal cord stimulation electrode noted in the thoracic spine central canal. Normal mediastinum and cardiac silhouette. Normal pulmonary vasculature. No evidence of effusion,  infiltrate, or pneumothorax. No acute bony abnormality. Anterior cervical fusion IMPRESSION: No acute cardiopulmonary process. Electronically Signed   By: Suzy Bouchard M.D.   On: 11/30/2015 10:17   Nm Myocar Multi W/spect W/wall Motion / Ef  12/15/2015   There was no ST segment deviation noted during stress.  The study is normal.  This is a low risk study.  Nuclear stress EF: 68%.     Microbiology: No results found for this or any previous visit (from the past 240 hour(s)).   Labs: Basic Metabolic Panel:  Recent Labs Lab 12/14/15 2015 12/15/15 0329  NA 139 139  K 3.7 3.5  CL 109 106  CO2 21* 23  GLUCOSE 111* 102*  BUN 11 12  CREATININE 1.02 0.94  CALCIUM 9.2 9.0   Liver Function Tests: No results for input(s): AST, ALT, ALKPHOS, BILITOT, PROT, ALBUMIN in the last 168 hours. No results for input(s): LIPASE, AMYLASE in the last 168 hours. No results for input(s): AMMONIA in the last 168 hours. CBC:  Recent Labs Lab 12/14/15 2015 12/15/15 0329  WBC 3.6* 2.9*  HGB 14.1 13.2  HCT 41.3 39.2  MCV 86.6 86.9  PLT 318 259   Cardiac Enzymes:  Recent Labs Lab 12/15/15 0329 12/15/15 1004 12/15/15 1454  TROPONINI <0.03 <0.03 <0.03   BNP: BNP (last 3 results) No results for input(s): BNP in the last 8760 hours.  ProBNP (last 3 results) No results for input(s): PROBNP in the last 8760 hours.  CBG: No results for input(s): GLUCAP in the last 168 hours.

## 2015-12-16 NOTE — Discharge Instructions (Signed)

## 2015-12-17 ENCOUNTER — Other Ambulatory Visit: Payer: Self-pay | Admitting: Internal Medicine

## 2015-12-23 ENCOUNTER — Encounter: Payer: Self-pay | Admitting: *Deleted

## 2015-12-23 NOTE — Progress Notes (Signed)
Patient ID: John Stahr., male   DOB: [birth year], 50 y.o.   MRN: [id number] Patient did not show up for 12/23/15, 10:00 AM, appointment to have a cardiac event monitor applied.

## 2015-12-27 ENCOUNTER — Other Ambulatory Visit: Payer: Self-pay | Admitting: Internal Medicine

## 2015-12-30 ENCOUNTER — Encounter (HOSPITAL_COMMUNITY): Payer: BC Managed Care – PPO

## 2015-12-30 ENCOUNTER — Ambulatory Visit (HOSPITAL_COMMUNITY): Payer: BC Managed Care – PPO | Attending: Cardiology

## 2015-12-30 ENCOUNTER — Encounter: Payer: Self-pay | Admitting: Physician Assistant

## 2015-12-30 ENCOUNTER — Other Ambulatory Visit: Payer: Self-pay

## 2015-12-30 ENCOUNTER — Ambulatory Visit (INDEPENDENT_AMBULATORY_CARE_PROVIDER_SITE_OTHER): Payer: BC Managed Care – PPO | Admitting: Physician Assistant

## 2015-12-30 VITALS — BP 120/84 | HR 60 | Ht 69.0 in | Wt 229.0 lb

## 2015-12-30 DIAGNOSIS — I10 Essential (primary) hypertension: Secondary | ICD-10-CM

## 2015-12-30 DIAGNOSIS — R0789 Other chest pain: Secondary | ICD-10-CM

## 2015-12-30 DIAGNOSIS — I119 Hypertensive heart disease without heart failure: Secondary | ICD-10-CM | POA: Diagnosis not present

## 2015-12-30 DIAGNOSIS — E785 Hyperlipidemia, unspecified: Secondary | ICD-10-CM | POA: Diagnosis not present

## 2015-12-30 DIAGNOSIS — I421 Obstructive hypertrophic cardiomyopathy: Secondary | ICD-10-CM

## 2015-12-30 DIAGNOSIS — I422 Other hypertrophic cardiomyopathy: Secondary | ICD-10-CM | POA: Diagnosis not present

## 2015-12-30 DIAGNOSIS — R079 Chest pain, unspecified: Secondary | ICD-10-CM | POA: Diagnosis present

## 2015-12-30 NOTE — Progress Notes (Signed)
Ferguson ID: [name]., male   DOB: 05/11/1966, 50 y.o.   MRN: [id number]    Date:  12/30/2015   ID:  [name]., DOB 12/15/1965, MRN [id number]  PCP:  John Cowden, MD  Primary Cardiologist:  [name]   Chief Complaint  Ferguson presents with  . Follow-up    some chest pain down neck and arm, waking up at night sweating, dizziness when gets up from bending     History of Present Illness: John Salway. is Ferguson 50 y.o. male with Ferguson John medical history of pulmonary and neurosarcoidosis, for which John is followed at [facility]. John has Ferguson hx of idiopathic hypertrophic cardiomyopathy, depression, HLD, GERD, HTN, and glucose intolerance.  Echocardiogram 12/01/12: Mild focal basal hypertrophy John septum, EF 0000000, grade 1 diastolic dysfunction, mild LAE. Ferguson had palpitations. John had one ventricular couplet on telemetry.In 07/2013, John ws revaluated for CP and ultimately underwent Ferguson LHC which showed normal coronaries. EF was 50-55%.  John was seen by [name] PAC on 12/09/2015 at that time she ordered Ferguson CardioNet monitor for 30 days for tachypalpitations. John was admitted 12/15/2015 with intermittent chest pain for 2-3 days. Had Lexiscan Myoview on 12/15/15, low risk study. No ischemia. John was discharged with plans to have Ferguson echocardiogram which was completed today. John was also rescheduled for his cardiac monitor. John is here again for posthospital follow-up. John reports some night sweats occasionally for John last week or 2.  John also continues to have intermittent left chest/left arm pain which radiates down from his left side of his neck. John has had prior neck surgery.    [name] currently denies nausea, vomiting, fever, shortness of breath, orthopnea, PND, cough, congestion, abdominal pain, hematochezia, melena, lower extremity edema, claudication.  Wt Readings from Last 3 Encounters:  12/30/15 229 lb (103.874 kg)  12/16/15 229 lb 12.8 oz (104.237 kg)    12/09/15 232 lb (105.235 kg)     John Medical History  Diagnosis Date  . B12 DEFICIENCY 03/06/2007  . CARDIOMYOPATHY, IDIOPATHIC HYPERTROPHIC 03/06/2007  . DEPRESSION 03/06/2007  . GERD 06/01/2007  . HYPERCHOLESTEROLEMIA 03/06/2007  . IMPAIRED GLUCOSE TOLERANCE 11/08/2008  . NEPHROLITHIASIS, HX OF 04/07/2010  . 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 OSTEOARTHRITIS 06/01/2007  . Palpitations 10/23/2007  . SYNDROME, CHRONIC PAIN 03/06/2007  . Cancer [facility])     prostate ca  . Prostate cancer ([place])   . HYPERTENSION 03/06/2007    [name]  . Pneumonia     hx of  . Dysrhythmia     sees Dr. Stanford Ferguson for "palpitations"  . DVT (deep venous thrombosis) ([place])   . Anxiety   . Peripheral vascular disease (HCC) 03    dvt  . SLEEP APNEA 03/06/2007    uses vpap, last sleep study 2011, Dr. Alanson Puls, at [place] [facility]     infreq  . History of kidney stones   . Anginal pain ([place])     s/p cardiac cath 07/19/13 showing NL coronaries, EF 50-55%  . Primary idiopathic hypertrophic cardiomyopathy ([place])     Current Outpatient Prescriptions  Medication Sig Dispense Refill  . albuterol (PROVENTIL HFA;VENTOLIN HFA) 108 (90 BASE) MCG/ACT inhaler Inhale 2 puffs into John lungs 2 (two) times daily as needed for wheezing or shortness of breath.     Marland Kitchen amitriptyline (ELAVIL) 25 MG tablet TAKE 1 TABLET (25 MG TOTAL) BY MOUTH AT BEDTIME. 30 tablet 5  . atenolol (TENORMIN) 25 MG tablet TAKE  1 TABLET BY MOUTH TWICE DAILY 180 tablet 0  . atorvastatin (LIPITOR) 40 MG tablet Take 40 mg by mouth daily.    Marland Kitchen azaTHIOprine (IMURAN) 50 MG tablet Take 250 mg by mouth daily.     Marland Kitchen buPROPion (WELLBUTRIN XL) 300 MG 24 hr tablet TAKE ONE TABLET BY MOUTH EVERY MORNING 90 tablet 0  . cyanocobalamin (,VITAMIN B-12,) 1000 MCG/ML injection Inject 1,000 mcg into John muscle every 30 (thirty) days. On or about John 1st of each month    . cycloSPORINE (RESTASIS) 0.05 % ophthalmic emulsion Place 2 drops into both eyes 2 (two)  times daily.     Marland Kitchen dicyclomine (BENTYL) 10 MG capsule TAKE 1 CAPSULE BY MOUTH 4 TIMES DAILY 360 capsule 0  . DULoxetine (CYMBALTA) 30 MG capsule TAKE ONE CAPSULE BY MOUTH DAILY (Ferguson taking differently: TAKE ONE CAPSULE BY MOUTH DAILY WITH Ferguson 60 MG CAPSULE FOR Ferguson 90 MG DOSE) 90 capsule 0  . erythromycin ophthalmic ointment Place 1 application into both eyes at bedtime.     . gabapentin (NEURONTIN) 300 MG capsule TAKE 3 CAPSULES BY MOUTH FOUR TIMES DAILY 360 capsule 0  . guaiFENesin (MUCINEX) 600 MG 12 hr tablet Take 600 mg by mouth 2 (two) times daily.     Marland Kitchen lidocaine (XYLOCAINE) 5 % ointment Apply 1 application topically 2 (two) times daily.    Marland Kitchen NEXIUM 40 MG capsule TAKE ONE CAPSULE BY MOUTH DAILY 90 capsule 2  . oxybutynin (DITROPAN-XL) 5 MG 24 hr tablet Take 5 mg by mouth daily.     Marland Kitchen oxyCODONE-acetaminophen (PERCOCET) 10-325 MG tablet Take 1 tablet by mouth See admin instructions. Take 1 tablet by mouth every morning and 1 tablet before 6pm    . Polyethyl Glycol-Propyl Glycol (SYSTANE PRESERVATIVE FREE OP) Place 1 drop into both eyes 2 (two) times daily.    . promethazine (PHENERGAN) 25 MG tablet Take 25 mg by mouth every 6 (six) hours as needed for nausea.     Marland Kitchen triamcinolone (NASACORT AQ) 55 MCG/ACT AERO nasal inhaler Place 1 spray into both nostrils daily as needed (dry mouth).     . triamcinolone ointment (KENALOG) 0.1 % Apply 1 application topically 2 (two) times daily.     No current facility-administered medications for this visit.    Allergies:    Allergies  Allergen Reactions  . Adhesive [Tape] Other (See Comments)    Skin irritation  . Pollen Extract Other (See Comments)    Stuffy nose    Social History:  [name]  reports that John quit smoking about 32 years ago. His smoking use included Cigarettes. John has Ferguson 1.5 pack-year smoking history. John has never used smokeless tobacco. John reports that John does not drink alcohol or use illicit drugs.   Family history:   Family  History  Problem Relation Age of Onset  . Heart disease Neg Hx     family hx CHF  . Kidney disease Neg Hx     family hx   . Cancer Neg Hx     family hx prostate    ROS:  Please see John history of present illness.  All other systems reviewed and negative.   PHYSICAL EXAM: VS:  BP 120/84 mmHg  Pulse 60  Ht 5\' 9"  (1.753 m)  Wt 229 lb (103.874 kg)  BMI 33.80 kg/m2 Obese, well developed, in no acute distress HEENT: Pupils are equal round react to light accommodation extraocular movements are intact.  Neck: no JVDNo cervical lymphadenopathy.  Cardiac: Regular rate and rhythm without murmurs rubs or gallops. Lungs:  clear to auscultation bilaterally, no wheezing, rhonchi or rales Abd: soft, nontender, positive bowel sounds all quadrants, no hepatosplenomegaly Ext: no lower extremity edema.  2+ radial and dorsalis pedis pulses. Skin: warm and dry Neuro:  Grossly normal  EKG:  Normal sinus rhythm rate 60 bpm    ASSESSMENT AND PLAN:  Problem List Items Addressed This Visit    Hypertrophic obstructive cardiomyopathy ([place]) (Chronic)   Essential hypertension - Primary   Relevant Orders   EKG 12-Lead   Dyslipidemia   Chest pain     Ferguson continues to complain of intermittent pain radiating down from last side of his neck into John axilla and left arm. John had Ferguson stress test 12/15/2015 which was normal. John does have prior history of neck surgery.  Perhaps his current symptoms are related to his neck.   Blood pressure is well-controlled. John has 2-D echocardiogram today and John results are pending. John is also still scheduled for Ferguson CardioNet monitor for tachycardia palpitations. John has follow-up scheduled already in one month with John Henri, PA-C.  his blood pressures well-controlled and will continue Lipitor 40 mg daily.

## 2015-12-30 NOTE — Patient Instructions (Signed)
Medication Instructions:   Your physician recommends that you continue on your current medications as directed. Please refer to the Current Medication list given to you today.   If you need a refill on your cardiac medications before your next appointment, please call your pharmacy.  Labwork:.NONE ORDER TODAY    Testing/Procedures:.NONE ORDER TODAY    Follow-Up: ALREADY SCHEDULED  Any Other Special Instructions Will Be Listed Below (If Applicable).

## 2015-12-31 ENCOUNTER — Encounter: Payer: Self-pay | Admitting: *Deleted

## 2015-12-31 NOTE — Progress Notes (Signed)
Patient ID: John Bachmann., male   DOB: 1966/07/13, 50 y.o.   MRN: [id number] Patient did not show up for 12/31/15, 4:00 PM, appointment to have a 30 day cardiac event monitor applied.

## 2016-01-06 ENCOUNTER — Telehealth: Payer: Self-pay | Admitting: Cardiology

## 2016-01-12 ENCOUNTER — Ambulatory Visit: Payer: BC Managed Care – PPO | Admitting: Cardiology

## 2016-01-19 NOTE — Telephone Encounter (Signed)
Pt is coming in on may 17th for Monitor appt.

## 2016-01-21 ENCOUNTER — Ambulatory Visit (INDEPENDENT_AMBULATORY_CARE_PROVIDER_SITE_OTHER): Payer: BC Managed Care – PPO

## 2016-01-21 ENCOUNTER — Telehealth: Payer: Self-pay | Admitting: Internal Medicine

## 2016-01-21 ENCOUNTER — Encounter: Payer: Self-pay | Admitting: *Deleted

## 2016-01-21 DIAGNOSIS — R0789 Other chest pain: Secondary | ICD-10-CM

## 2016-01-21 DIAGNOSIS — R002 Palpitations: Secondary | ICD-10-CM

## 2016-01-21 DIAGNOSIS — I422 Other hypertrophic cardiomyopathy: Secondary | ICD-10-CM

## 2016-01-21 DIAGNOSIS — R42 Dizziness and giddiness: Secondary | ICD-10-CM | POA: Diagnosis not present

## 2016-01-21 MED ORDER — DULOXETINE HCL 60 MG PO CPEP: 60.0000 mg | ORAL_CAPSULE | Freq: Every day | ORAL | Status: DC

## 2016-01-21 MED ORDER — DULOXETINE HCL 30 MG PO CPEP: 30.0000 mg | ORAL_CAPSULE | Freq: Every day | ORAL | Status: DC

## 2016-01-21 MED ORDER — ATENOLOL 25 MG PO TABS: 25.0000 mg | ORAL_TABLET | Freq: Two times a day (BID) | ORAL | Status: DC

## 2016-01-21 NOTE — Telephone Encounter (Signed)
Spoke to pt, clarified Rx for Cymbalta. Pt is taking a 30 mg and 60 mg tablet of Cymbalta. Told pt will send Rx's to pharmacy as requested. Pt verbalized understanding. Rx's sent to pharmacy.

## 2016-01-21 NOTE — Telephone Encounter (Signed)
Pt request refill of the following:   DULoxetine (CYMBALTA) 30 MG capsule , atenolol (TENORMIN) 25 MG tablet  Pt has been scheduled for his physical and is requesting a refill    Phamacy:  [facility]

## 2016-01-26 ENCOUNTER — Ambulatory Visit: Payer: BC Managed Care – PPO | Admitting: Cardiology

## 2016-01-29 ENCOUNTER — Ambulatory Visit: Payer: BC Managed Care – PPO | Admitting: Cardiology

## 2016-02-10 DIAGNOSIS — Z7689 Persons encountering health services in other specified circumstances: Secondary | ICD-10-CM

## 2016-02-27 ENCOUNTER — Telehealth: Payer: Self-pay | Admitting: *Deleted

## 2016-02-27 NOTE — Telephone Encounter (Signed)
-----   Message from Lelon Perla, MD sent at 02/26/2016 10:43 AM EDT ----- Increase atenolol to 50 mg daily Kirk Ruths

## 2016-02-27 NOTE — Telephone Encounter (Signed)
Left message for pt to call.

## 2016-03-01 ENCOUNTER — Encounter: Payer: Self-pay | Admitting: Cardiology

## 2016-03-01 ENCOUNTER — Ambulatory Visit (INDEPENDENT_AMBULATORY_CARE_PROVIDER_SITE_OTHER): Payer: BC Managed Care – PPO | Admitting: Cardiology

## 2016-03-01 VITALS — Ht 69.0 in | Wt 229.4 lb

## 2016-03-01 DIAGNOSIS — I493 Ventricular premature depolarization: Secondary | ICD-10-CM | POA: Diagnosis not present

## 2016-03-01 MED ORDER — ATENOLOL 50 MG PO TABS: 50.0000 mg | ORAL_TABLET | Freq: Every day | ORAL | Status: DC

## 2016-03-01 NOTE — Telephone Encounter (Signed)
Spoke with pt, Aware of dr crenshaw's recommendations. New script sent to the pharmacy  

## 2016-03-01 NOTE — Patient Instructions (Signed)
Medication Instructions:   Your physician recommends that you continue on your current medications as directed. Please refer to the Current Medication list given to you today.   If you need a refill on your cardiac medications before your next appointment, please call your pharmacy.  Labwork: NONE ORDER TODAY    Testing/Procedures: NONE ORDER TODAY    Follow-Up: Your physician wants you to follow-up in:  IN  6  MONTHS WITH DR CRENSHAW   You will receive a reminder letter in the mail two months in advance. If you don't receive a letter, please call our office to schedule the follow-up appointment.       Any Other Special Instructions Will Be Listed Below (If Applicable).                                                                                                                                                   

## 2016-03-01 NOTE — Progress Notes (Signed)
03/01/2016 John Manns Jr.   04/29/1966  [id number]  Primary Physician Nyoka Cowden, MD Primary Cardiologist: [name]   Reason for Visit/CC: F/u for CP and palpitations.   HPI:  50 y/o AAM followed by [name]. He has been followed in the past at [facility] for pulmonary and neurosarcoidosis. He has a hx of idiopathic hypertrophic cardiomyopathy, depression, HLD, GERD, HTN, and glucose intolerance. He was seen for chest pain by cardiology in 12/2011. Myoview 12/29/11: No ischemia, EF 66%. He was admitted 3/28-4/1 with chest pain. He was again seen by cardiology. Symptoms were felt to be atypical for ischemia. Cardiac markers were negative. D-dimer was negative. Echocardiogram 12/01/12: Mild focal basal hypertrophy the septum, EF 0000000, grade 1 diastolic dysfunction, mild LAE. Patient had palpitations. He had one ventricular couplet on telemetry.In 07/2013, he ws revaluated for CP and ultimately underwent a LHC which showed normal coronaries. EF was 50-55%. His CP was noncardiac. He has not been seen by our practice since that time. He was being followed some at [facility] in the past with cardiac MRIs as there was concern for possible sarcoid cardiac involvement. However he now has a spinal stimulator, thus he is unable to get MRIs. He has not had a cardiac MRI in several years. As far as he knows, no diagnosis of cardiac sarcoid was made.   He was recently seen at the [facility] ED on 11/30/15 with a complaint of chest pain, described as gradually worsening achy left-sided chest pain radiating down his left arm. Symptoms were associated with brief palpitations. W/u in the ED was negative, including normal D-dimer, negative troponins x 2 and normal CBC and CMP. His CXR was also unremarkable. EKGs showed no ischemic abnormalities but did show PVCs. He had moderate improvement with Toradol. He was advised to f/u in our office for further recommendations  I evaluated him on  12/09/15. He continued to note episodic left sided chest pain radiating to his neck and left arm. Symptoms would occur with exertion and relieved with rest. He also noted tachypalpiations, dizziness and near syncope. No frank syncope.  I ordered a NST to r/o ischemia and a repeat 2D echo. I also ordered a 30 day heart monitor to r/o worrisome ventricular arrhthymias.   He presents back today for f/u. NST was low risk. No ischemia. 2D echo showed normal LVEF of 60-65%. G1DD. Normal LV size with moderate focal basal septal hypertrophy. No LV outflow tract gradient. Wall motion was normal; there were no regional wall motion abnormalities. There appeared to be chordal but not valvular SAM. There was no significant mitral regurgitation. His cardiac monitor findings were interpreted by [name]. This showed mainly NSR with PVCs. He instructed him to increase his atenolol to 50 mg daily.   He denies any further CP. He continues to have palpitations. Patient states he did not receive the phone message to increase his atenolol to 50 mg, however he notes that he has already been taking 25 mg BID. In further discussion, it appears that he has been drinking energy drinks daily. He drinks Monster Coffee 20 oz drinks. No ETOH.     Current Outpatient Prescriptions  Medication Sig Dispense Refill  . albuterol (PROVENTIL HFA;VENTOLIN HFA) 108 (90 BASE) MCG/ACT inhaler Inhale 2 puffs into the lungs 2 (two) times daily as needed for wheezing or shortness of breath.     Marland Kitchen amitriptyline (ELAVIL) 25 MG tablet TAKE 1 TABLET (25 MG TOTAL) BY MOUTH AT BEDTIME. [place]  tablet 5  . atenolol (TENORMIN) 25 MG tablet Take 1 tablet (25 mg total) by mouth 2 (two) times daily. 180 tablet 0  . atorvastatin (LIPITOR) 40 MG tablet Take 40 mg by mouth daily.    Marland Kitchen azaTHIOprine (IMURAN) 50 MG tablet Take 250 mg by mouth daily.     Marland Kitchen buPROPion (WELLBUTRIN XL) 300 MG 24 hr tablet TAKE ONE TABLET BY MOUTH EVERY MORNING 90 tablet 0  .  cyanocobalamin (,VITAMIN B-12,) 1000 MCG/ML injection Inject 1,000 mcg into the muscle every 30 (thirty) days. On or about the 1st of each month    . cycloSPORINE (RESTASIS) 0.05 % ophthalmic emulsion Place 2 drops into both eyes 2 (two) times daily.     Marland Kitchen dicyclomine (BENTYL) 10 MG capsule TAKE 1 CAPSULE BY MOUTH 4 TIMES DAILY 360 capsule 0  . DULoxetine (CYMBALTA) 30 MG capsule Take 1 capsule (30 mg total) by mouth daily. 90 capsule 0  . DULoxetine (CYMBALTA) 60 MG capsule Take 1 capsule (60 mg total) by mouth daily. 90 capsule 0  . erythromycin ophthalmic ointment Place 1 application into both eyes at bedtime.     . gabapentin (NEURONTIN) 300 MG capsule TAKE 3 CAPSULES BY MOUTH FOUR TIMES DAILY 360 capsule 0  . guaiFENesin (MUCINEX) 600 MG 12 hr tablet Take 600 mg by mouth 2 (two) times daily.     Marland Kitchen lidocaine (XYLOCAINE) 5 % ointment Apply 1 application topically 2 (two) times daily.    Marland Kitchen NEXIUM 40 MG capsule TAKE ONE CAPSULE BY MOUTH DAILY 90 capsule 2  . oxybutynin (DITROPAN-XL) 5 MG 24 hr tablet Take 5 mg by mouth daily.     Marland Kitchen oxyCODONE-acetaminophen (PERCOCET) 10-325 MG tablet Take 1 tablet by mouth See admin instructions. Take 1 tablet by mouth every morning and 1 tablet before 6pm    . Polyethyl Glycol-Propyl Glycol (SYSTANE PRESERVATIVE FREE OP) Place 1 drop into both eyes 2 (two) times daily.    . promethazine (PHENERGAN) 25 MG tablet Take 25 mg by mouth every 6 (six) hours as needed for nausea.     Marland Kitchen triamcinolone (NASACORT AQ) 55 MCG/ACT AERO nasal inhaler Place 1 spray into both nostrils daily as needed (dry mouth).     . triamcinolone ointment (KENALOG) 0.1 % Apply 1 application topically 2 (two) times daily.     No current facility-administered medications for this visit.    Allergies  Allergen Reactions  . Adhesive [Tape] Other (See Comments)    Skin irritation  . Pollen Extract Other (See Comments)    Stuffy nose    Social History   Social History  . Marital Status:  Married    Spouse Name: N/A  . Number of Children: N/A  . 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 Smoking status: Former Smoker -- 0.50 packs/day for 3 years    Types: Cigarettes    Quit date: 09/07/1983  . Smokeless tobacco: Never Used  . Alcohol Use: No  . Drug Use: No  . Sexual Activity: Yes   Other Topics Concern  . Not on file   Social History Narrative     Review of Systems: General: negative for chills, fever, night sweats or weight changes.  Cardiovascular: negative for chest pain, dyspnea on exertion, edema, orthopnea, palpitations, paroxysmal nocturnal dyspnea or shortness of breath Dermatological: negative for rash Respiratory: negative for cough or wheezing Urologic: negative for hematuria Abdominal: negative for  nausea, vomiting, diarrhea, bright red blood per rectum, melena, or hematemesis Neurologic: negative for visual changes, syncope, or dizziness All other systems reviewed and are otherwise negative except as noted above.    Height 5\' 9"  (1.753 m), weight 229 lb 6.4 oz (104.055 kg).  General appearance: alert, cooperative and no distress Neck: no carotid bruit and no JVD Lungs: clear to auscultation bilaterally Heart: regular rate and rhythm, S1, S2 normal, no murmur, click, rub or gallop Extremities: extremities normal, atraumatic, no cyanosis or edema Pulses: 2+ and symmetric Skin: Skin color, texture, turgor normal. No rashes or lesions Neurologic: Grossly normal  EKG not performed   ASSESSMENT AND PLAN:   1. Chest Pain: normal cath in 2014. NST 12/2015 was low risk for ischemia. Normal LVF. No recurrent symptoms.    2. Hypertrophic Cardiomyopathy:  2D echo showed normal LVEF of 60-65%. G1DD. normal LV size with moderate focal basal septal hypertrophy. No LV outflow tract gradient. Wall motion was normal; there were no regional wall motion abnormalities. There appeared to be chordal but not  valvular SAM. There was no significant mitral regurgitation. No worrisome arrhthymias on telemetry.   3. Palpitations: recent 30 day monitor showed NSR with PVCs. Patient reports daily h/o energy drink consumption (Monster Coffee). We discussed the association of caffeine and palpitations. Patient was advised to discontinue energy drinks/ reduced cafffieen consumption. He is to continue his atenolol at 25 mg BID. He is to notify our office if continued or worsening symptoms. If no improvement after dietary change, we will advise to further increase his BB.   PLAN  F/u with [name] in 6 months   Lyda Jester [facility] 03/01/2016 7:54 AM

## 2016-03-15 DIAGNOSIS — M461 Sacroiliitis, not elsewhere classified: Secondary | ICD-10-CM | POA: Diagnosis not present

## 2016-03-15 DIAGNOSIS — M79641 Pain in right hand: Secondary | ICD-10-CM | POA: Diagnosis not present

## 2016-03-15 DIAGNOSIS — M9981 Other biomechanical lesions of cervical region: Secondary | ICD-10-CM | POA: Diagnosis not present

## 2016-03-15 DIAGNOSIS — M5416 Radiculopathy, lumbar region: Secondary | ICD-10-CM | POA: Diagnosis not present

## 2016-03-19 DIAGNOSIS — M79644 Pain in right finger(s): Secondary | ICD-10-CM | POA: Diagnosis not present

## 2016-04-23 ENCOUNTER — Other Ambulatory Visit: Payer: Self-pay

## 2016-05-03 ENCOUNTER — Encounter: Payer: BC Managed Care – PPO | Admitting: Internal Medicine

## 2016-05-03 ENCOUNTER — Other Ambulatory Visit (INDEPENDENT_AMBULATORY_CARE_PROVIDER_SITE_OTHER): Payer: Medicare Other

## 2016-05-03 DIAGNOSIS — Z0000 Encounter for general adult medical examination without abnormal findings: Secondary | ICD-10-CM | POA: Diagnosis not present

## 2016-05-03 LAB — HEPATIC FUNCTION PANEL
ALT: 19 U/L (ref 0–53)
AST: 24 U/L (ref 0–37)
Albumin: 4.3 g/dL (ref 3.5–5.2)
Alkaline Phosphatase: 46 U/L (ref 39–117)
Bilirubin, Direct: 0.1 mg/dL (ref 0.0–0.3)
Total Bilirubin: 0.6 mg/dL (ref 0.2–1.2)
Total Protein: 6.8 g/dL (ref 6.0–8.3)

## 2016-05-03 LAB — CBC WITH DIFFERENTIAL/PLATELET
Basophils Absolute: 0 10*3/uL (ref 0.0–0.1)
Basophils Relative: 0.5 % (ref 0.0–3.0)
Eosinophils Absolute: 0.1 10*3/uL (ref 0.0–0.7)
Eosinophils Relative: 3.1 % (ref 0.0–5.0)
HCT: 41.5 % (ref 39.0–52.0)
Hemoglobin: 14.1 g/dL (ref 13.0–17.0)
Lymphocytes Relative: 29.6 % (ref 12.0–46.0)
Lymphs Abs: 0.8 10*3/uL (ref 0.7–4.0)
MCHC: 33.9 g/dL (ref 30.0–36.0)
MCV: 87.9 fl (ref 78.0–100.0)
Monocytes Absolute: 0.5 10*3/uL (ref 0.1–1.0)
Monocytes Relative: 17.1 % — ABNORMAL HIGH (ref 3.0–12.0)
Neutro Abs: 1.4 10*3/uL (ref 1.4–7.7)
Neutrophils Relative %: 49.7 % (ref 43.0–77.0)
Platelets: 365 10*3/uL (ref 150.0–400.0)
RBC: 4.72 Mil/uL (ref 4.22–5.81)
RDW: 15.4 % (ref 11.5–15.5)
WBC: 2.7 10*3/uL — ABNORMAL LOW (ref 4.0–10.5)

## 2016-05-03 LAB — BASIC METABOLIC PANEL
BUN: 10 mg/dL (ref 6–23)
CO2: 25 mEq/L (ref 19–32)
Calcium: 8.8 mg/dL (ref 8.4–10.5)
Chloride: 106 mEq/L (ref 96–112)
Creatinine, Ser: 0.85 mg/dL (ref 0.40–1.50)
GFR: 122.2 mL/min (ref >60.00)
Glucose, Bld: 107 mg/dL — ABNORMAL HIGH (ref 70–99)
Potassium: 4.1 mEq/L (ref 3.5–5.1)
Sodium: 140 mEq/L (ref 135–145)

## 2016-05-03 LAB — LIPID PANEL
Cholesterol: 152 mg/dL (ref 0–200)
HDL: 40.4 mg/dL (ref >39.00)
LDL Cholesterol: 99 mg/dL (ref 0–99)
NonHDL: 111.61
Total CHOL/HDL Ratio: 4
Triglycerides: 65 mg/dL (ref 0.0–149.0)
VLDL: 13 mg/dL (ref 0.0–40.0)

## 2016-05-03 LAB — POC URINALSYSI DIPSTICK (AUTOMATED)
Blood, UA: NEGATIVE
Glucose, UA: NEGATIVE
Ketones, UA: NEGATIVE
Leukocytes, UA: NEGATIVE
Nitrite, UA: NEGATIVE
Spec Grav, UA: 1.025
Urobilinogen, UA: 2
pH, UA: 6

## 2016-05-03 LAB — PSA: PSA: 0.02 ng/mL — ABNORMAL LOW (ref 0.10–4.00)

## 2016-05-03 LAB — TSH: TSH: 1.46 u[IU]/mL (ref 0.35–4.50)

## 2016-05-11 ENCOUNTER — Encounter: Payer: Self-pay | Admitting: Internal Medicine

## 2016-05-11 DIAGNOSIS — Z0289 Encounter for other administrative examinations: Secondary | ICD-10-CM

## 2016-05-14 ENCOUNTER — Emergency Department (HOSPITAL_COMMUNITY): Payer: Medicare Other

## 2016-05-14 ENCOUNTER — Other Ambulatory Visit: Payer: Self-pay

## 2016-05-14 ENCOUNTER — Encounter (HOSPITAL_COMMUNITY): Payer: Self-pay | Admitting: *Deleted

## 2016-05-14 ENCOUNTER — Emergency Department (HOSPITAL_COMMUNITY)
Admission: EM | Admit: 2016-05-14 | Discharge: 2016-05-14 | Disposition: A | Discharge status: Home / Self Care | Payer: Medicare Other | Attending: Physician Assistant | Admitting: Physician Assistant

## 2016-05-14 DIAGNOSIS — I10 Essential (primary) hypertension: Secondary | ICD-10-CM | POA: Diagnosis not present

## 2016-05-14 DIAGNOSIS — Z8546 Personal history of malignant neoplasm of prostate: Secondary | ICD-10-CM | POA: Diagnosis not present

## 2016-05-14 DIAGNOSIS — Z87891 Personal history of nicotine dependence: Secondary | ICD-10-CM | POA: Diagnosis not present

## 2016-05-14 DIAGNOSIS — Z955 Presence of coronary angioplasty implant and graft: Secondary | ICD-10-CM | POA: Insufficient documentation

## 2016-05-14 DIAGNOSIS — R079 Chest pain, unspecified: Secondary | ICD-10-CM | POA: Diagnosis not present

## 2016-05-14 DIAGNOSIS — R0789 Other chest pain: Secondary | ICD-10-CM | POA: Diagnosis not present

## 2016-05-14 DIAGNOSIS — R0602 Shortness of breath: Secondary | ICD-10-CM | POA: Diagnosis not present

## 2016-05-14 LAB — BASIC METABOLIC PANEL
Anion gap: 6 (ref 5–15)
BUN: 12 mg/dL (ref 6–20)
CO2: 27 mmol/L (ref 22–32)
Calcium: 9.6 mg/dL (ref 8.9–10.3)
Chloride: 106 mmol/L (ref 101–111)
Creatinine, Ser: 1.15 mg/dL (ref 0.61–1.24)
GFR calc Af Amer: >60 mL/min (ref >60)
GFR calc non Af Amer: >60 mL/min (ref >60)
Glucose, Bld: 102 mg/dL — ABNORMAL HIGH (ref 65–99)
Potassium: 4.1 mmol/L (ref 3.5–5.1)
Sodium: 139 mmol/L (ref 135–145)

## 2016-05-14 LAB — CBC
HCT: 43.1 % (ref 39.0–52.0)
Hemoglobin: 14.6 g/dL (ref 13.0–17.0)
MCH: 30 pg (ref 26.0–34.0)
MCHC: 33.9 g/dL (ref 30.0–36.0)
MCV: 88.5 fL (ref 78.0–100.0)
Platelets: 379 10*3/uL (ref 150–400)
RBC: 4.87 MIL/uL (ref 4.22–5.81)
RDW: 14.9 % (ref 11.5–15.5)
WBC: 3.2 10*3/uL — ABNORMAL LOW (ref 4.0–10.5)

## 2016-05-14 LAB — I-STAT TROPONIN, ED: Troponin i, poc: 0 ng/mL (ref 0.00–0.08)

## 2016-05-14 LAB — TROPONIN I: Troponin I: <0.03 ng/mL (ref <0.03)

## 2016-05-14 MED ORDER — ACETAMINOPHEN 325 MG PO TABS
650.0000 mg | ORAL_TABLET | Freq: Once | ORAL | Status: AC
  Administered 2016-05-14: 650 mg via ORAL
  Filled 2016-05-14: qty 2

## 2016-05-14 MED ORDER — IBUPROFEN 400 MG PO TABS
400.0000 mg | ORAL_TABLET | Freq: Once | ORAL | Status: AC
  Administered 2016-05-14: 400 mg via ORAL
  Filled 2016-05-14: qty 2

## 2016-05-14 MED ORDER — ASPIRIN EC 325 MG PO TBEC
325.0000 mg | DELAYED_RELEASE_TABLET | Freq: Once | ORAL | Status: AC
  Administered 2016-05-14: 325 mg via ORAL
  Filled 2016-05-14: qty 1

## 2016-05-14 MED ORDER — GI COCKTAIL ~~LOC~~
30.0000 mL | Freq: Once | ORAL | Status: AC
  Administered 2016-05-14: 30 mL via ORAL
  Filled 2016-05-14: qty 30

## 2016-05-14 NOTE — Discharge Instructions (Signed)
We are unsure what is causing her chest pain today. You have any changes please return immediately. Please follow up with your cardiologist.

## 2016-05-14 NOTE — ED Provider Notes (Signed)
[place] DEPT Provider Note   CSN: [id number] Arrival date & time: 05/14/16  1702     History   Chief Complaint Chief Complaint  Patient presents with  . Chest Pain    HPI [name]. is a 50 y.o. male.  The history is provided by the patient.  Chest Pain   This is a new problem. The current episode started 3 to 5 hours ago. The problem occurs rarely. The problem has been resolved. The pain is associated with rest. Pain location: diffuse chest. The pain is at a severity of 3/10. The pain is mild. The quality of the pain is described as brief and sharp. The pain does not radiate. Associated symptoms include shortness of breath. Pertinent negatives include no abdominal pain, no diaphoresis, no dizziness and no palpitations. He has tried nothing for the symptoms. The treatment provided no relief. Risk factors include male gender.  His past medical history is significant for hyperlipidemia and hypertension.  Procedure history is positive for echocardiogram.   No leg swelling, no recent trips   Past Medical History:  Diagnosis Date  . Anginal pain ([place])    s/p cardiac cath 07/19/13 showing NL coronaries, EF 50-55%  . Anxiety   . B12 DEFICIENCY 03/06/2007  . Cancer [facility])    prostate ca  . CARDIOMYOPATHY, IDIOPATHIC HYPERTROPHIC 03/06/2007  . DEPRESSION 03/06/2007  . DVT (deep venous thrombosis) ([place])   . Dysrhythmia    sees [name] for "palpitations"  . GERD 06/01/2007  . History of kidney stones   . HYPERCHOLESTEROLEMIA 03/06/2007  . HYPERTENSION 03/06/2007   [name]  . IMPAIRED GLUCOSE TOLERANCE 11/08/2008  . NEPHROLITHIASIS, HX OF 04/07/2010  . 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 OSTEOARTHRITIS 06/01/2007  . Palpitations 10/23/2007  . Peripheral vascular disease (HCC) 03   dvt  . Pneumonia    hx of  . Primary idiopathic hypertrophic cardiomyopathy ([place])   . Prostate cancer ([place])   . Shortness of breath    infreq  . SLEEP APNEA 03/06/2007   uses vpap, last  sleep study 2011, Dr. Alanson Puls, at [facility], CHRONIC PAIN 03/06/2007    Patient Active Problem List   Diagnosis Date Noted  . Chest pain 12/15/2015  . Hyperlipidemia 12/15/2015  . Precordial chest pain   . Eyelid retraction 06/13/2014  . H/O amblyopia 06/13/2014  . Dry eye 04/15/2014  . Back pain 04/11/2014  . Polypharmacy 08/07/2013  . Obstructive apnea 05/31/2013  . Burn of toe of right foot, second degree 04/16/2013  . Cellulitis of foot, right 04/16/2013  . Lower urinary tract symptoms 03/20/2013  . 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 Hypotension, postural 10/24/2012  . Blepharitis 10/23/2012  . Uncontrolled pain 05/28/2012  . Hip pain, acute 05/27/2012  . Chest pain at rest 12/29/2011    Class: Acute  . Breath shortness 11/25/2011  . Fatigue 11/23/2011  . Obstructive sleep apnea of adult 11/23/2011  . CA of prostate ([place]) 10/26/2011  . ED (erectile dysfunction) of organic origin 10/26/2011  . Kidney lump 10/26/2011  . Enlarged prostate with lower urinary tract symptoms (LUTS) 10/26/2011  . Abnormal PSA 10/21/2011  . Dyslipidemia 10/07/2011  . Orthostasis 10/07/2011  . 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 Fever 08/18/2011  . Chest pain, unspecified 08/18/2011  . Sarcoid ([place]) 08/18/2011  . Dermoid inclusion cyst 08/16/2011  . NEPHROLITHIASIS, HX OF 04/07/2010  . IMPAIRED GLUCOSE TOLERANCE 11/08/2008  . Pure hypercholesterolemia 11/08/2008  . Palpitations 10/23/2007  . GERD 06/01/2007  .  OSTEOARTHRITIS 06/01/2007  . 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 B12 deficiency 03/06/2007  . DEPRESSION 03/06/2007  . SYNDROME, CHRONIC PAIN 03/06/2007  . Essential hypertension 03/06/2007  . Hypertrophic obstructive cardiomyopathy ([place]) 03/06/2007  . Sleep apnea 03/06/2007    Past Surgical History:  Procedure Laterality Date  . CARDIAC CATHETERIZATION    . decompression and fusion     cervicothoracic  . EYE SURGERY Bilateral    blepharoplasty  . LEFT  HEART CATHETERIZATION WITH CORONARY ANGIOGRAM N/A 07/19/2013   Procedure: LEFT HEART CATHETERIZATION WITH CORONARY ANGIOGRAM;  Surgeon: Jolaine Artist, MD;  Location[facility] CATH LAB;  Service: Cardiovascular;  Laterality: N/A;  . LEG SKIN LESION  BIOPSY / EXCISION     left leg  . LUMBAR FUSION    . PARTIAL NEPHRECTOMY Left    bx  . PENILE PROSTHESIS IMPLANT    . PROSTATE SURGERY     robotic prostatectomy  . SPINAL CORD STIMULATOR INSERTION N/A 04/11/2014   Procedure: LUMBAR SPINAL CORD STIMULATOR INSERTION;  Surgeon: Newman Pies, MD;  Location: [facility] NEURO ORS;  Service: Neurosurgery;  Laterality: N/A;  . TONSILLECTOMY         Home Medications    Prior to Admission medications   Medication Sig Start Date End Date Taking? Authorizing Provider  amitriptyline (ELAVIL) 25 MG tablet TAKE 1 TABLET (25 MG TOTAL) BY MOUTH AT BEDTIME. 12/29/15  Yes Marletta Lor, MD  atenolol (TENORMIN) 50 MG tablet Take 1 tablet (50 mg total) by mouth daily. 03/01/16  Yes Lelon Perla, MD  atorvastatin (LIPITOR) 40 MG tablet Take 40 mg by mouth daily.   Yes Historical Provider, MD  azaTHIOprine (IMURAN) 50 MG tablet Take 250 mg by mouth daily.    Yes Historical Provider, MD  buPROPion (WELLBUTRIN XL) 300 MG 24 hr tablet TAKE ONE TABLET BY MOUTH EVERY MORNING 11/07/15  Yes Marletta Lor, MD  cyanocobalamin (,VITAMIN B-12,) 1000 MCG/ML injection Inject 1,000 mcg into the muscle every 30 (thirty) days. On or about the 1st of each month   Yes Historical Provider, MD  cycloSPORINE (RESTASIS) 0.05 % ophthalmic emulsion Place 2 drops into both eyes 2 (two) times daily.    Yes Historical Provider, MD  dicyclomine (BENTYL) 10 MG capsule TAKE 1 CAPSULE BY MOUTH 4 TIMES DAILY 07/28/15  Yes Marletta Lor, MD  DULoxetine (CYMBALTA) 30 MG capsule Take 1 capsule (30 mg total) by mouth daily. Patient taking differently: Take 30 mg by mouth daily. Total of 90mg  01/21/16  Yes Marletta Lor, MD    DULoxetine (CYMBALTA) 60 MG capsule Take 1 capsule (60 mg total) by mouth daily. Patient taking differently: Take 60 mg by mouth daily. Total of 90mg  01/21/16  Yes Marletta Lor, MD  erythromycin ophthalmic ointment Place 1 application into both eyes at bedtime.  04/15/14  Yes Historical Provider, MD  gabapentin (NEURONTIN) 300 MG capsule TAKE 3 CAPSULES BY MOUTH FOUR TIMES DAILY 12/06/14  Yes Marletta Lor, MD  guaiFENesin (MUCINEX) 600 MG 12 hr tablet Take 600 mg by mouth 2 (two) times daily.    Yes Historical Provider, MD  lidocaine (XYLOCAINE) 5 % ointment Apply 1 application topically daily as needed for moderate pain.    Yes Historical Provider, MD  NEXIUM 40 MG capsule TAKE ONE CAPSULE BY MOUTH DAILY 10/09/14  Yes Marletta Lor, MD  oxybutynin (DITROPAN-XL) 5 MG 24 hr tablet Take 5 mg by mouth daily.  01/30/15  Yes Historical Provider, MD  oxyCODONE-acetaminophen (PERCOCET)  10-325 MG tablet Take 1 tablet by mouth See admin instructions. Take 1 tablet by mouth every morning and 1 tablet before 6pm   Yes Historical Provider, MD  Polyethyl Glycol-Propyl Glycol (SYSTANE PRESERVATIVE FREE OP) Place 1 drop into both eyes 2 (two) times daily.   Yes Historical Provider, MD  triamcinolone (NASACORT AQ) 55 MCG/ACT AERO nasal inhaler Place 1 spray into both nostrils daily as needed (dry mouth).    Yes Historical Provider, MD  triamcinolone ointment (KENALOG) 0.1 % Apply 1 application topically daily.    Yes Historical Provider, MD  albuterol (PROVENTIL HFA;VENTOLIN HFA) 108 (90 BASE) MCG/ACT inhaler Inhale 2 puffs into the lungs 2 (two) times daily as needed for wheezing or shortness of breath.     Historical Provider, MD    Family History Family History  Problem Relation Age of Onset  . Heart disease Mother   . Kidney disease Neg Hx     family hx   . Cancer Neg Hx     family hx prostate    Social History Social History  Substance Use Topics  . Smoking status: Former Smoker     Packs/day: 0.50    Years: 3.00    Types: Cigarettes    Quit date: 09/07/1983  . Smokeless tobacco: Never Used  . Alcohol use No     Allergies   Adhesive [tape] and Pollen extract   Review of Systems Review of Systems  Constitutional: Negative for diaphoresis.  Respiratory: Positive for shortness of breath.   Cardiovascular: Positive for chest pain. Negative for palpitations.  Gastrointestinal: Negative for abdominal pain.  Neurological: Negative for dizziness.  All other systems reviewed and are negative.    Physical Exam Updated Vital Signs BP 115/67   Pulse 62   Temp 98.6 F (37 C) (Oral)   Resp 18   Wt 220 lb (99.8 kg)   SpO2 95%   BMI 32.49 kg/m   Physical Exam  Constitutional: He is oriented to person, place, and time. He appears well-nourished.  HENT:  Head: Normocephalic.  Eyes: Conjunctivae are normal.  Cardiovascular: Normal rate and regular rhythm.   Pulmonary/Chest: Effort normal. No respiratory distress. He has no wheezes.  Abdominal: Soft. There is no tenderness.  Neurological: He is oriented to person, place, and time.  Skin: Skin is warm and dry. He is not diaphoretic.  Psychiatric: He has a normal mood and affect. His behavior is normal.     ED Treatments / Results  Labs (all labs ordered are listed, but only abnormal results are displayed) Labs Reviewed  BASIC METABOLIC PANEL - Abnormal; Notable for the following:       Result Value   Glucose, Bld 102 (*)    All other components within normal limits  CBC - Abnormal; Notable for the following:    WBC 3.2 (*)    All other components within normal limits  TROPONIN I  I-STAT TROPOININ, ED    EKG  EKG Interpretation  Date/Time:  Friday May 14 2016 17:07:53 EDT Ventricular Rate:  64 PR Interval:  176 QRS Duration: 82 QT Interval:  384 QTC Calculation: 396 R Axis:   27 Text Interpretation:  Normal sinus rhythm Septal infarct , age undetermined Abnormal ECG No significant change  since last tracing Confirmed by Gerald Leitz ([id number]) on 05/14/2016 8:35:26 PM       Radiology Dg Chest 2 View  Result Date: 05/14/2016 CLINICAL DATA:  Chest pain, shortness of breath and LEFT neck pain today. History  of hypertension . EXAM: CHEST  2 VIEW COMPARISON:  Chest radiograph December 14, 2015 FINDINGS: Cardiomediastinal silhouette is normal. Similarly increased lung volumes. No pleural effusions or focal consolidations. Trachea projects midline and there is no pneumothorax. Soft tissue planes and included osseous structures are non-suspicious. ACDF. Spinal stimulator leads project in mid thoracic spine. Partially imaged lumbar hardware. IMPRESSION: No active cardiopulmonary disease. Electronically Signed   By: Elon Alas M.D.   On: 05/14/2016 18:14    Procedures Procedures (including critical care time)  Medications Ordered in ED Medications  ibuprofen (ADVIL,MOTRIN) tablet 400 mg (not administered)  acetaminophen (TYLENOL) tablet 650 mg (not administered)  gi cocktail (Maalox,Lidocaine,Donnatal) (30 mLs Oral Given 05/14/16 2117)  aspirin EC tablet 325 mg (325 mg Oral Given 05/14/16 2116)     Initial Impression / Assessment and Plan / ED Course  I have reviewed the triage vital signs and the nursing notes.  Pertinent labs & imaging results that were available during my care of the patient were reviewed by me and considered in my medical decision making (see chart for details).  Clinical Course    Patietn is a 50 y.o. male with a past medical history of pulmonary and neurosarcoidosis,  a hx of idiopathic hypertrophic cardiomyopathy, depression, HLD, GERD, HTN, and glucose intolerance.History of DVT. In 07/2013, he ws revaluated for CP and ultimately underwent a LHC which showed normal coronaries. EF was 50-55%.  He was seen by Ellen Henri PAC on 12/09/2015 at that time she ordered a CardioNet monitor for 30 days for tachypalpitations. He was admitted 12/15/2015 with  intermittent chest pain for 2-3 days. Had Lexiscan Myoview on 12/15/15, low risk study. No ischemia.Normal echo in April 2017.Patient is here with chest pain that happened occasionally today. Not exertionally related. Did not radiate, no diaphoresis,. No risk factors for PE, no tachycardia here. The chest pain does not sound cardiac given the fact that it was all over the chest not exertionally related. We will do delta troponin given that he has had normal coronaries in the past and low risk studies Myoview within the last several months.  Delta trop negative. Normal vitals signs and PE.   Patient is comfortable, ambulatory, and taking PO at time of discharge.  Patient expressed understanding about return precautions.     Final Clinical Impressions(s) / ED Diagnoses   Final diagnoses:  Chest pain, unspecified chest pain type    New Prescriptions New Prescriptions   No medications on file     Errol Ala Julio Alm, MD 05/14/16 2220

## 2016-05-14 NOTE — ED Triage Notes (Signed)
The pt is c/o central chest pain for one hour with some sob and vision problems with a headache also

## 2016-05-14 NOTE — ED Notes (Signed)
Patient able to ambulate independently  

## 2016-05-14 NOTE — ED Notes (Signed)
Pt complained of chest pain worsening. Informed Tina - RN. Pt's vitals were reassessed.

## 2016-05-25 DIAGNOSIS — I10 Essential (primary) hypertension: Secondary | ICD-10-CM | POA: Diagnosis not present

## 2016-05-25 DIAGNOSIS — Z9119 Patient's noncompliance with other medical treatment and regimen: Secondary | ICD-10-CM | POA: Diagnosis not present

## 2016-05-25 DIAGNOSIS — E669 Obesity, unspecified: Secondary | ICD-10-CM | POA: Diagnosis not present

## 2016-05-25 DIAGNOSIS — Z6832 Body mass index (BMI) 32.0-32.9, adult: Secondary | ICD-10-CM | POA: Diagnosis not present

## 2016-05-25 DIAGNOSIS — G4733 Obstructive sleep apnea (adult) (pediatric): Secondary | ICD-10-CM | POA: Diagnosis not present

## 2016-05-25 DIAGNOSIS — Z9989 Dependence on other enabling machines and devices: Secondary | ICD-10-CM | POA: Diagnosis not present

## 2016-05-27 ENCOUNTER — Encounter: Payer: Self-pay | Admitting: Internal Medicine

## 2016-05-27 ENCOUNTER — Ambulatory Visit (INDEPENDENT_AMBULATORY_CARE_PROVIDER_SITE_OTHER): Payer: Medicare Other | Admitting: Internal Medicine

## 2016-05-27 VITALS — BP 130/86 | HR 67 | Temp 98.2°F | Resp 20 | Ht 69.0 in | Wt 220.0 lb

## 2016-05-27 DIAGNOSIS — Z23 Encounter for immunization: Secondary | ICD-10-CM | POA: Diagnosis not present

## 2016-05-27 DIAGNOSIS — D869 Sarcoidosis, unspecified: Secondary | ICD-10-CM | POA: Diagnosis not present

## 2016-05-27 DIAGNOSIS — E785 Hyperlipidemia, unspecified: Secondary | ICD-10-CM | POA: Diagnosis not present

## 2016-05-27 DIAGNOSIS — G4733 Obstructive sleep apnea (adult) (pediatric): Secondary | ICD-10-CM | POA: Diagnosis not present

## 2016-05-27 DIAGNOSIS — Z79899 Other long term (current) drug therapy: Secondary | ICD-10-CM | POA: Diagnosis not present

## 2016-05-27 DIAGNOSIS — I421 Obstructive hypertrophic cardiomyopathy: Secondary | ICD-10-CM | POA: Diagnosis not present

## 2016-05-27 DIAGNOSIS — Z0000 Encounter for general adult medical examination without abnormal findings: Secondary | ICD-10-CM

## 2016-05-27 DIAGNOSIS — I10 Essential (primary) hypertension: Secondary | ICD-10-CM

## 2016-05-27 DIAGNOSIS — R634 Abnormal weight loss: Secondary | ICD-10-CM | POA: Diagnosis not present

## 2016-05-27 DIAGNOSIS — R6881 Early satiety: Secondary | ICD-10-CM | POA: Diagnosis not present

## 2016-05-27 DIAGNOSIS — Z6832 Body mass index (BMI) 32.0-32.9, adult: Secondary | ICD-10-CM | POA: Diagnosis not present

## 2016-05-27 DIAGNOSIS — Z87891 Personal history of nicotine dependence: Secondary | ICD-10-CM | POA: Diagnosis not present

## 2016-05-27 DIAGNOSIS — F329 Major depressive disorder, single episode, unspecified: Secondary | ICD-10-CM | POA: Diagnosis not present

## 2016-05-27 NOTE — Progress Notes (Signed)
Subjective:    Patient ID: John Carol., male    DOB: 11-28-1965, 50 y.o.   MRN: [id number]  HPI  50 -year-old patient who is seen today for a preventive health examination. He is followed annually by urology.  He is status post radical prostatectomy at age 50.  He had a penile implant A few  years later He has chronic pain and has had a spinal stimulator placed. He has a history of hypertrophic cardiomyopathy.  B12 deficiency, hypertension, impaired glucose tolerance dyslipidemia, and osteoarthritis.  He has a history of sarcoidosis and gastroesophageal reflux disease  He is followed by multiple consultants, mainly at [facility].  He has had a EGD on 2 occasions and he feels that he also had a colonoscopy 5-6 years ago.  This may have been an incomplete study due to tortuosity  Wt Readings from Last 3 Encounters:  05/27/16 220 lb (99.8 kg)  05/14/16 220 lb (99.8 kg)  03/01/16 229 lb 6.4 oz (104.1 kg)   Patient states that he has had a weight loss of 18 pounds over the past 3 months that he attributes to stress and poor appetite  Past Medical History:  Diagnosis Date  . Anginal pain ([place])    s/p cardiac cath 07/19/13 showing NL coronaries, EF 50-55%  . Anxiety   . B12 DEFICIENCY 03/06/2007  . Cancer [facility])    prostate ca  . CARDIOMYOPATHY, IDIOPATHIC HYPERTROPHIC 03/06/2007  . DEPRESSION 03/06/2007  . DVT (deep venous thrombosis) ([place])   . Dysrhythmia    sees [name] for "palpitations"  . GERD 06/01/2007  . History of kidney stones   . HYPERCHOLESTEROLEMIA 03/06/2007  . HYPERTENSION 03/06/2007   [name]  . IMPAIRED GLUCOSE TOLERANCE 11/08/2008  . NEPHROLITHIASIS, HX OF 04/07/2010  . 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 OSTEOARTHRITIS 06/01/2007  . Palpitations 10/23/2007  . Peripheral vascular disease (HCC) 03   dvt  . Pneumonia    hx of  . Primary idiopathic hypertrophic cardiomyopathy ([place])   . Prostate cancer ([place])   . Shortness of breath    infreq  . SLEEP  APNEA 03/06/2007   uses vpap, last sleep study 2011, Dr. Alanson Puls, at [facility], CHRONIC PAIN 03/06/2007    Social History   Social History  . Marital status: Married    Spouse name: N/A  . Number of children: N/A  . 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 Smoking status: Former Smoker    Packs/day: 0.50    Years: 3.00    Types: Cigarettes    Quit date: 09/07/1983  . Smokeless tobacco: Never Used  . Alcohol use No  . Drug use: No  . Sexual activity: Yes   Other Topics Concern  . Not on file   Social History Narrative  . No narrative on file    Past Surgical History:  Procedure Laterality Date  . CARDIAC CATHETERIZATION    . decompression and fusion     cervicothoracic  . EYE SURGERY Bilateral    blepharoplasty  . LEFT HEART CATHETERIZATION WITH CORONARY ANGIOGRAM N/A 07/19/2013   Procedure: LEFT HEART CATHETERIZATION WITH CORONARY ANGIOGRAM;  Surgeon: Jolaine Artist, MD;  Location: [facility] CATH LAB;  Service: Cardiovascular;  Laterality: N/A;  . LEG SKIN LESION  BIOPSY / EXCISION     left leg  . LUMBAR FUSION    . PARTIAL NEPHRECTOMY Left  bx  . PENILE PROSTHESIS IMPLANT    . PROSTATE SURGERY     robotic prostatectomy  . SPINAL CORD STIMULATOR INSERTION N/A 04/11/2014   Procedure: LUMBAR SPINAL CORD STIMULATOR INSERTION;  Surgeon: Newman Pies, MD;  Location: [facility] NEURO ORS;  Service: Neurosurgery;  Laterality: N/A;  . TONSILLECTOMY      Family History  Problem Relation Age of Onset  . Heart disease Mother   . Kidney disease Neg Hx     family hx   . Cancer Neg Hx     family hx prostate    Allergies  Allergen Reactions  . Adhesive [Tape] Other (See Comments)    Skin irritation  . Pollen Extract Other (See Comments)    Stuffy nose    Current Outpatient Prescriptions on File Prior to Visit  Medication Sig Dispense Refill  . albuterol (PROVENTIL HFA;VENTOLIN HFA) 108 (90  BASE) MCG/ACT inhaler Inhale 2 puffs into the lungs 2 (two) times daily as needed for wheezing or shortness of breath.     Marland Kitchen amitriptyline (ELAVIL) 25 MG tablet TAKE 1 TABLET (25 MG TOTAL) BY MOUTH AT BEDTIME. 30 tablet 5  . atenolol (TENORMIN) 50 MG tablet Take 1 tablet (50 mg total) by mouth daily. 30 tablet 11  . atorvastatin (LIPITOR) 40 MG tablet Take 40 mg by mouth daily.    Marland Kitchen azaTHIOprine (IMURAN) 50 MG tablet Take 250 mg by mouth daily.     Marland Kitchen buPROPion (WELLBUTRIN XL) 300 MG 24 hr tablet TAKE ONE TABLET BY MOUTH EVERY MORNING 90 tablet 0  . cyanocobalamin (,VITAMIN B-12,) 1000 MCG/ML injection Inject 1,000 mcg into the muscle every 30 (thirty) days. On or about the 1st of each month    . cycloSPORINE (RESTASIS) 0.05 % ophthalmic emulsion Place 2 drops into both eyes 2 (two) times daily.     Marland Kitchen dicyclomine (BENTYL) 10 MG capsule TAKE 1 CAPSULE BY MOUTH 4 TIMES DAILY 360 capsule 0  . DULoxetine (CYMBALTA) 30 MG capsule Take 1 capsule (30 mg total) by mouth daily. (Patient taking differently: Take 30 mg by mouth daily. Total of 90mg ) 90 capsule 0  . DULoxetine (CYMBALTA) 60 MG capsule Take 1 capsule (60 mg total) by mouth daily. (Patient taking differently: Take 60 mg by mouth daily. Total of 90mg ) 90 capsule 0  . erythromycin ophthalmic ointment Place 1 application into both eyes at bedtime.     . gabapentin (NEURONTIN) 300 MG capsule TAKE 3 CAPSULES BY MOUTH FOUR TIMES DAILY 360 capsule 0  . guaiFENesin (MUCINEX) 600 MG 12 hr tablet Take 600 mg by mouth 2 (two) times daily.     Marland Kitchen lidocaine (XYLOCAINE) 5 % ointment Apply 1 application topically daily as needed for moderate pain.     Marland Kitchen NEXIUM 40 MG capsule TAKE ONE CAPSULE BY MOUTH DAILY 90 capsule 2  . oxybutynin (DITROPAN-XL) 5 MG 24 hr tablet Take 5 mg by mouth daily.     Marland Kitchen oxyCODONE-acetaminophen (PERCOCET) 10-325 MG tablet Take 1 tablet by mouth See admin instructions. Take 1 tablet by mouth every morning and 1 tablet before 6pm    .  Polyethyl Glycol-Propyl Glycol (SYSTANE PRESERVATIVE FREE OP) Place 1 drop into both eyes 2 (two) times daily.    Marland Kitchen triamcinolone (NASACORT AQ) 55 MCG/ACT AERO nasal inhaler Place 1 spray into both nostrils daily as needed (dry mouth).     . triamcinolone ointment (KENALOG) 0.1 % Apply 1 application topically daily.      No current facility-administered medications  on file prior to visit.     BP 130/86 (BP Location: Left Arm, Patient Position: Sitting, Cuff Size: Normal)   Pulse 67   Temp 98.2 F (36.8 C) (Oral)   Resp 20   Ht 5\' 9"  (1.753 m)   Wt 220 lb (99.8 kg)   SpO2 98%   BMI 32.49 kg/m    Medicare wellness exam  1. Risk factors, based on past  M,S,F history.  Cardiovascular risk factors include a history of essential hypertension.  As well as dyslipidemia.  He has a history of chest pain and has had heart catheterizations with normal coronary arteries  2.  Physical activities: Limited due to chronic low back pain  3.  Depression/mood: History depression, presently controlled on medication  4.  Hearing: No deficits  5.  ADL's: Independent in all aspects of daily living  6.  Fall risk: Low  7.  Home safety: No problems identified  8.  Height weight, and visual acuity; height and weight stable no change in visual acuity  9.  Counseling: Continue heart healthy diet efforts at weight loss and more regular exercise  10. Lab orders based on risk factors: Laboratory profile reviewed  11. Referral : Not appropriate at this time.  Followed by multiple consultants  12. Care plan: Continue efforts at aggressive risk factor modification  13. Cognitive assessment: Alert and oriented with normal affect.  No cognitive dysfunction  14. Screening: Patient provided with a written and personalized 5-10 year screening schedule in the AVS.    15. Provider List Update: Primary care medicine, cardiology, chronic pain management.  Neurology, pulmonary medicine, urology.  Rheumatology  and ophthalmology  Social history.  Married, planning to go to mortuary school in [place] for 18 months, starting October 2017.  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  HENT: Negative for congestion, dental problem, ear pain, hearing loss, mouth sores, rhinorrhea, sinus pressure, sneezing, tinnitus, trouble swallowing and voice change.   Eyes: Negative for photophobia, pain, redness and visual disturbance.  Respiratory: Negative for apnea, cough, choking, chest tightness, shortness of breath and wheezing.   Cardiovascular: Negative for chest pain, palpitations and leg swelling.  Gastrointestinal: Negative for abdominal distention, abdominal pain, anal bleeding, blood in stool, constipation, diarrhea, nausea, rectal pain and vomiting.  Genitourinary: Negative for decreased urine volume, difficulty urinating, discharge, dysuria, flank pain, frequency, genital sores, hematuria, penile swelling, scrotal swelling, testicular pain and urgency.  Musculoskeletal: Positive for arthralgias and back pain. Negative for gait problem, joint swelling, myalgias, neck pain and neck stiffness.  Skin: Negative for color change, rash and wound.  Neurological: Negative for dizziness, tremors, seizures, syncope, facial asymmetry, speech difficulty, weakness, light-headedness, numbness and headaches.  Hematological: Negative for adenopathy. Does not bruise/bleed easily.  Psychiatric/Behavioral: Negative for agitation, behavioral problems, confusion, decreased concentration, dysphoric mood, hallucinations, self-injury, sleep disturbance and suicidal ideas. The patient is not nervous/anxious.        Objective:   Physical Exam  Constitutional: He appears well-developed and well-nourished.  Overweight No distress Blood pressure low normal  HENT:  Head: Normocephalic and atraumatic.  Right Ear: External  ear normal.  Left Ear: External ear normal.  Nose: Nose normal.  Mouth/Throat: Oropharynx is clear and moist.  Eyes: Conjunctivae and EOM are normal. Pupils are equal, round, and reactive to light. No scleral icterus.  Neck: Normal range of motion. Neck supple. No JVD present. No thyromegaly  present.  Cardiovascular: Regular rhythm and intact distal pulses.  Exam reveals no gallop and no friction rub.   No murmur heard. No murmur is audible today.  Even in a standing position  Decreased left dorsalis pedis pulse  Pulmonary/Chest: Effort normal and breath sounds normal. He exhibits no tenderness.  Abdominal: Soft. Bowel sounds are normal. He exhibits no distension and no mass. There is no tenderness.  Genitourinary: Penis normal.  Genitourinary Comments: Status post penile prosthesis  Musculoskeletal: Normal range of motion. He exhibits no edema or tenderness.  Multiple scars posterior back Spinal cord stimulator noted.  Right upper lumbar area Well-healed laminectomy scar  Lymphadenopathy:    He has no cervical adenopathy.  Neurological: He is alert. He has normal reflexes. No cranial nerve deficit. Coordination normal.  Decreased monofilament testing right foot  Skin: Skin is warm and dry. No rash noted.  Multiple surgical scars  Psychiatric: He has a normal mood and affect. His behavior is normal.          Assessment & Plan:   Preventive health examination Hypertrophic cardiomyopathy.  Follow-up cardiology Hypertension Impaired glucose tolerance Dyslipidemia Chronic pain syndrome Neurosarcoidosis.  Follow-up rheumatology and ophthalmology Obesity/OSA.  Follow-up pulmonary medicine Prostate cancer.  Follow-up urology History of modest weight loss.  Patient attributes to stress.  Will observe   Updated labs Reviewed No change in medical regimen Followup multiple consultants    Recheck in 6 months or as needed Flu vaccine administered  Nyoka Cowden

## 2016-05-27 NOTE — Patient Instructions (Signed)
Limit your sodium (Salt) intake    It is important that you exercise regularly, at least 20 minutes 3 to 4 times per week.  If you develop chest pain or shortness of breath seek  medical attention.  You need to lose weight.  Consider a lower calorie diet and regular exercise.  Follow-up multiple consultants as scheduled  Return in one year for follow-up

## 2016-05-27 NOTE — Progress Notes (Signed)
Pre visit review using our clinic review tool, if applicable. No additional management support is needed unless otherwise documented below in the visit note. 

## 2016-06-04 ENCOUNTER — Other Ambulatory Visit: Payer: Self-pay | Admitting: Internal Medicine

## 2016-07-27 DIAGNOSIS — M47818 Spondylosis without myelopathy or radiculopathy, sacral and sacrococcygeal region: Secondary | ICD-10-CM | POA: Diagnosis not present

## 2016-07-27 DIAGNOSIS — M461 Sacroiliitis, not elsewhere classified: Secondary | ICD-10-CM | POA: Diagnosis not present

## 2016-07-28 ENCOUNTER — Other Ambulatory Visit: Payer: Self-pay | Admitting: Internal Medicine

## 2016-07-30 ENCOUNTER — Other Ambulatory Visit: Payer: Self-pay | Admitting: Internal Medicine

## 2016-09-07 DIAGNOSIS — M961 Postlaminectomy syndrome, not elsewhere classified: Secondary | ICD-10-CM | POA: Diagnosis not present

## 2016-09-07 DIAGNOSIS — Z6833 Body mass index (BMI) 33.0-33.9, adult: Secondary | ICD-10-CM | POA: Diagnosis not present

## 2016-09-07 DIAGNOSIS — M5416 Radiculopathy, lumbar region: Secondary | ICD-10-CM | POA: Diagnosis not present

## 2016-09-07 DIAGNOSIS — Z8546 Personal history of malignant neoplasm of prostate: Secondary | ICD-10-CM | POA: Diagnosis not present

## 2016-09-07 DIAGNOSIS — R309 Painful micturition, unspecified: Secondary | ICD-10-CM | POA: Diagnosis not present

## 2016-09-07 DIAGNOSIS — I10 Essential (primary) hypertension: Secondary | ICD-10-CM | POA: Diagnosis not present

## 2016-09-07 DIAGNOSIS — N529 Male erectile dysfunction, unspecified: Secondary | ICD-10-CM | POA: Diagnosis not present

## 2016-09-07 DIAGNOSIS — C642 Malignant neoplasm of left kidney, except renal pelvis: Secondary | ICD-10-CM | POA: Diagnosis not present

## 2016-09-07 DIAGNOSIS — Z85528 Personal history of other malignant neoplasm of kidney: Secondary | ICD-10-CM | POA: Diagnosis not present

## 2016-09-07 DIAGNOSIS — C61 Malignant neoplasm of prostate: Secondary | ICD-10-CM | POA: Diagnosis not present

## 2016-09-07 DIAGNOSIS — Z08 Encounter for follow-up examination after completed treatment for malignant neoplasm: Secondary | ICD-10-CM | POA: Diagnosis not present

## 2016-09-07 DIAGNOSIS — R399 Unspecified symptoms and signs involving the genitourinary system: Secondary | ICD-10-CM | POA: Diagnosis not present

## 2016-09-07 DIAGNOSIS — Z905 Acquired absence of kidney: Secondary | ICD-10-CM | POA: Diagnosis not present

## 2016-09-07 DIAGNOSIS — Z9079 Acquired absence of other genital organ(s): Secondary | ICD-10-CM | POA: Diagnosis not present

## 2016-09-09 DIAGNOSIS — Z79899 Other long term (current) drug therapy: Secondary | ICD-10-CM | POA: Diagnosis not present

## 2016-09-09 DIAGNOSIS — D869 Sarcoidosis, unspecified: Secondary | ICD-10-CM | POA: Diagnosis not present

## 2016-09-17 DIAGNOSIS — M961 Postlaminectomy syndrome, not elsewhere classified: Secondary | ICD-10-CM | POA: Diagnosis not present

## 2016-09-17 DIAGNOSIS — M5416 Radiculopathy, lumbar region: Secondary | ICD-10-CM | POA: Diagnosis not present

## 2017-01-19 ENCOUNTER — Other Ambulatory Visit: Payer: Self-pay | Admitting: Internal Medicine

## 2017-01-19 NOTE — Telephone Encounter (Signed)
Patient's last office visit was 05/27/16.  This medication was last refilled 08/02/16 for #90 with 0 refills.

## 2017-01-19 NOTE — Telephone Encounter (Signed)
Is this ok to refill?  

## 2017-01-21 NOTE — Telephone Encounter (Signed)
Okay to refill? 

## 2017-04-01 DIAGNOSIS — M5416 Radiculopathy, lumbar region: Secondary | ICD-10-CM | POA: Diagnosis not present

## 2017-04-23 ENCOUNTER — Other Ambulatory Visit: Payer: Self-pay | Admitting: Cardiology

## 2017-05-23 DIAGNOSIS — M961 Postlaminectomy syndrome, not elsewhere classified: Secondary | ICD-10-CM | POA: Diagnosis not present

## 2017-05-23 DIAGNOSIS — M545 Low back pain: Secondary | ICD-10-CM | POA: Diagnosis not present

## 2017-05-26 ENCOUNTER — Encounter: Payer: Self-pay | Admitting: Internal Medicine

## 2017-05-29 ENCOUNTER — Other Ambulatory Visit: Payer: Self-pay | Admitting: Internal Medicine

## 2017-06-13 DIAGNOSIS — Z9989 Dependence on other enabling machines and devices: Secondary | ICD-10-CM | POA: Diagnosis not present

## 2017-06-13 DIAGNOSIS — E785 Hyperlipidemia, unspecified: Secondary | ICD-10-CM | POA: Diagnosis not present

## 2017-06-13 DIAGNOSIS — I10 Essential (primary) hypertension: Secondary | ICD-10-CM | POA: Diagnosis not present

## 2017-06-13 DIAGNOSIS — H53002 Unspecified amblyopia, left eye: Secondary | ICD-10-CM | POA: Diagnosis not present

## 2017-06-13 DIAGNOSIS — H2513 Age-related nuclear cataract, bilateral: Secondary | ICD-10-CM | POA: Diagnosis not present

## 2017-06-13 DIAGNOSIS — G4733 Obstructive sleep apnea (adult) (pediatric): Secondary | ICD-10-CM | POA: Diagnosis not present

## 2017-06-13 DIAGNOSIS — H04123 Dry eye syndrome of bilateral lacrimal glands: Secondary | ICD-10-CM | POA: Diagnosis not present

## 2017-06-13 DIAGNOSIS — Z87891 Personal history of nicotine dependence: Secondary | ICD-10-CM | POA: Diagnosis not present

## 2017-06-13 DIAGNOSIS — D869 Sarcoidosis, unspecified: Secondary | ICD-10-CM | POA: Diagnosis not present

## 2017-07-14 DIAGNOSIS — M545 Low back pain: Secondary | ICD-10-CM | POA: Diagnosis not present

## 2017-07-14 DIAGNOSIS — D869 Sarcoidosis, unspecified: Secondary | ICD-10-CM | POA: Diagnosis not present

## 2017-07-14 DIAGNOSIS — M47816 Spondylosis without myelopathy or radiculopathy, lumbar region: Secondary | ICD-10-CM | POA: Diagnosis not present

## 2017-07-14 DIAGNOSIS — Z79899 Other long term (current) drug therapy: Secondary | ICD-10-CM | POA: Diagnosis not present

## 2017-07-14 DIAGNOSIS — M542 Cervicalgia: Secondary | ICD-10-CM | POA: Diagnosis not present

## 2017-07-14 DIAGNOSIS — M961 Postlaminectomy syndrome, not elsewhere classified: Secondary | ICD-10-CM | POA: Diagnosis not present

## 2017-07-18 ENCOUNTER — Other Ambulatory Visit: Payer: Self-pay | Admitting: Cardiology

## 2017-07-20 DIAGNOSIS — M5136 Other intervertebral disc degeneration, lumbar region: Secondary | ICD-10-CM | POA: Diagnosis not present

## 2017-07-20 DIAGNOSIS — Z9889 Other specified postprocedural states: Secondary | ICD-10-CM | POA: Diagnosis not present

## 2017-07-26 DIAGNOSIS — M545 Low back pain: Secondary | ICD-10-CM | POA: Diagnosis not present

## 2017-07-26 DIAGNOSIS — M542 Cervicalgia: Secondary | ICD-10-CM | POA: Diagnosis not present

## 2017-07-26 DIAGNOSIS — G894 Chronic pain syndrome: Secondary | ICD-10-CM | POA: Diagnosis not present

## 2017-08-09 DIAGNOSIS — M5416 Radiculopathy, lumbar region: Secondary | ICD-10-CM | POA: Diagnosis not present

## 2017-08-26 ENCOUNTER — Ambulatory Visit (INDEPENDENT_AMBULATORY_CARE_PROVIDER_SITE_OTHER): Payer: Medicare Other

## 2017-08-26 VITALS — BP 112/80 | HR 86 | Ht 69.0 in | Wt 219.0 lb

## 2017-08-26 DIAGNOSIS — Z23 Encounter for immunization: Secondary | ICD-10-CM | POA: Diagnosis not present

## 2017-08-26 DIAGNOSIS — Z0000 Encounter for general adult medical examination without abnormal findings: Secondary | ICD-10-CM

## 2017-08-26 NOTE — Progress Notes (Addendum)
Subjective:   [name]. is a 51 y.o. male who presents for Medicare Annual/Subsequent preventive examination.  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 Ulla Gallo  Dx in 2006 Extremely dry eyes also followed at [facility]  New symptoms;  Tremor of the lip occasionally Right hand gets numb and fingers lock up  Also has noted issues with constipation with bowel and will talk to Dr. Eliberto Ivory  Is fup with neurology at [facility] 12/26   Diet Appetite has decreased Watched portions and now eats x 2 per day Not hungry or busy  Lost over 10lbs since his last visit   Cardiac Risk Factors include: obesity (BMI >30kg/m2)BMI 32   Exercise Gets in the pool and does aquatic therapy Try to do this 3 times a week x 60   Health Maintenance Due  Topic Date Due  . COLONOSCOPY  04/27/2015  . INFLUENZA VACCINE  04/06/2017   Will take his flu vaccine today Colonoscopy - thinks he had one at [facility]  HIV; has had one since you were 17;     Objective:    Vitals: BP 112/80   Pulse 86   Ht 5\' 9"  (1.753 m)   Wt 219 lb (99.3 kg)   SpO2 97%   BMI 32.34 kg/m   Body mass index is 32.34 kg/m.  Advanced Directives 08/26/2017 05/14/2016 12/14/2015 11/30/2015 05/19/2015 10/15/2014 04/05/2014  Does Patient Have a Medical Advance Directive? No No No No No No Patient does not have advance directive;Patient would not like information  Would patient like information on creating a medical advance directive? - No - patient declined information - - - - -  Pre-existing out of facility DNR order (yellow form or pink MOST form) - - - - - - -   His family knows his wishes   Tobacco Social History   Tobacco Use  Smoking Status Former Smoker  . Packs/day: 0.50  . Years: 3.00  . Pack years: 1.50  . Types: Cigarettes  . Last attempt to quit: 09/07/1983  . Years since quitting: 33.9   Smokeless Tobacco Never Used  Tobacco Comment   brief history      Counseling given: Yes Comment: brief history    Clinical Intake:   Past Medical History:  Diagnosis Date  . Anginal pain ([place])    s/p cardiac cath 07/19/13 showing NL coronaries, EF 50-55%  . Anxiety   . B12 DEFICIENCY 03/06/2007  . Cancer [facility])    prostate ca  . CARDIOMYOPATHY, IDIOPATHIC HYPERTROPHIC 03/06/2007  . DEPRESSION 03/06/2007  . DVT (deep venous thrombosis) ([place])   . Dysrhythmia    sees [name] for "palpitations"  . GERD 06/01/2007  . History of kidney stones   . HYPERCHOLESTEROLEMIA 03/06/2007  . HYPERTENSION 03/06/2007   [name]  . IMPAIRED GLUCOSE TOLERANCE 11/08/2008  . NEPHROLITHIASIS, HX OF 04/07/2010  . 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 OSTEOARTHRITIS 06/01/2007  . Palpitations 10/23/2007  . Peripheral vascular disease (HCC) 03   dvt  . Pneumonia    hx of  . Primary idiopathic hypertrophic cardiomyopathy ([place])   . Prostate cancer ([place])   . Shortness of breath    infreq  . SLEEP APNEA 03/06/2007   uses vpap, last sleep study 2011, Dr. Alanson Puls, at [place], CHRONIC PAIN 03/06/2007   Past Surgical  History:  Procedure Laterality Date  . CARDIAC CATHETERIZATION    . decompression and fusion     cervicothoracic  . EYE SURGERY Bilateral    blepharoplasty  . LEFT HEART CATHETERIZATION WITH CORONARY ANGIOGRAM N/A 07/19/2013   Procedure: LEFT HEART CATHETERIZATION WITH CORONARY ANGIOGRAM;  Surgeon: Jolaine Artist, MD;  Location: [facility] CATH LAB;  Service: Cardiovascular;  Laterality: N/A;  . LEG SKIN LESION  BIOPSY / EXCISION     left leg  . LUMBAR FUSION    . PARTIAL NEPHRECTOMY Left    bx  . PENILE PROSTHESIS IMPLANT    . PROSTATE SURGERY     robotic prostatectomy  . SPINAL CORD STIMULATOR INSERTION N/A 04/11/2014   Procedure: LUMBAR SPINAL CORD STIMULATOR INSERTION;  Surgeon: Newman Pies, MD;  Location: [facility] NEURO ORS;  Service: Neurosurgery;  Laterality: N/A;  .  TONSILLECTOMY     Family History  Problem Relation Age of Onset  . Heart disease Mother   . Kidney disease Neg Hx        family hx   . Cancer Neg Hx        family hx prostate   Social History   Socioeconomic History  . Marital status: Married    Spouse name: Not on file  . Number of children: Not on file  . Years of education: Not on file  . Highest education level: Not on file  Social Needs  . Financial resource strain: Not on file  . Food insecurity - worry: Not on file  . Food insecurity - inability: Not on file  . Transportation needs - medical: Not on file  . Transportation needs - non-medical: Not on file  Occupational History  . Occupation: Architect: UNEMPLOYED  Tobacco Use  . Smoking status: Former Smoker    Packs/day: 0.50    Years: 3.00    Pack years: 1.50    Types: Cigarettes    Last attempt to quit: 09/07/1983    Years since quitting: 33.9  . Smokeless tobacco: Never Used  . Tobacco comment: brief history   Substance and Sexual Activity  . Alcohol use: No    Comment: occ  . Drug use: No  . Sexual activity: Yes  Other Topics Concern  . Not on file  Social History Narrative  . Not on file    Outpatient Encounter Medications as of 08/26/2017  Medication Sig  . albuterol (PROVENTIL HFA;VENTOLIN HFA) 108 (90 BASE) MCG/ACT inhaler Inhale 2 puffs into the lungs 2 (two) times daily as needed for wheezing or shortness of breath.   Marland Kitchen amitriptyline (ELAVIL) 25 MG tablet TAKE 1 TABLET (25 MG TOTAL) BY MOUTH AT BEDTIME.  Marland Kitchen atenolol (TENORMIN) 50 MG tablet TAKE ONE TABLET BY MOUTH DAILY  . atorvastatin (LIPITOR) 40 MG tablet Take 40 mg by mouth daily.  Marland Kitchen azaTHIOprine (IMURAN) 50 MG tablet Take 250 mg by mouth daily.   Marland Kitchen buPROPion (WELLBUTRIN XL) 300 MG 24 hr tablet TAKE ONE TABLET BY MOUTH EVERY MORNING  . cyanocobalamin (,VITAMIN B-12,) 1000 MCG/ML injection Inject 1,000 mcg into the muscle every 30 (thirty) days. On or about the 1st of each month    . cycloSPORINE (RESTASIS) 0.05 % ophthalmic emulsion Place 2 drops into both eyes 2 (two) times daily.   Marland Kitchen dicyclomine (BENTYL) 10 MG capsule TAKE 1 CAPSULE BY MOUTH 4 TIMES DAILY  . DULoxetine (CYMBALTA) 30 MG capsule TAKE ONE CAPSULE BY MOUTH DAILY  . DULoxetine (CYMBALTA) 60  MG capsule TAKE ONE CAPSULE BY MOUTH DAILY  . erythromycin ophthalmic ointment Place 1 application into both eyes at bedtime.   . gabapentin (NEURONTIN) 300 MG capsule TAKE 3 CAPSULES BY MOUTH FOUR TIMES DAILY  . guaiFENesin (MUCINEX) 600 MG 12 hr tablet Take 600 mg by mouth 2 (two) times daily.   Marland Kitchen lidocaine (XYLOCAINE) 5 % ointment Apply 1 application topically daily as needed for moderate pain.   Marland Kitchen NEXIUM 40 MG capsule TAKE ONE CAPSULE BY MOUTH DAILY  . oxyCODONE-acetaminophen (PERCOCET) 10-325 MG tablet Take 1 tablet by mouth See admin instructions. Take 1 tablet by mouth every morning and 1 tablet before 6pm  . Polyethyl Glycol-Propyl Glycol (SYSTANE PRESERVATIVE FREE OP) Place 1 drop into both eyes 2 (two) times daily.  Marland Kitchen triamcinolone (NASACORT AQ) 55 MCG/ACT AERO nasal inhaler Place 1 spray into both nostrils daily as needed (dry mouth).   . triamcinolone ointment (KENALOG) 0.1 % Apply 1 application topically daily.   Marland Kitchen oxybutynin (DITROPAN-XL) 5 MG 24 hr tablet Take 5 mg by mouth daily.    No facility-administered encounter medications on file as of 08/26/2017.     Activities of Daily Living In your present state of health, do you have any difficulty performing the following activities: 08/26/2017  Hearing? N  Vision? N  Difficulty concentrating or making decisions? N  Walking or climbing stairs? N  Dressing or bathing? N  Doing errands, shopping? N  Preparing Food and eating ? N  Using the Toilet? N  In the past six months, have you accidently leaked urine? N  Do you have problems with loss of bowel control? N  Managing your Medications? N  Managing your Finances? N  Housekeeping or managing your  Housekeeping? N  Some recent data might be hidden    Patient Care Team: Marletta Lor, MD as PCP - General Dorann Ou, MD as Referring Physician (Pulmonary Disease)   Assessment:   This is a routine wellness examination for [place].  Exercise Activities and Dietary recommendations Current Exercise Habits: Structured exercise class, Type of exercise: Other - see comments(water aerobics ), Time (Minutes): 60, Frequency (Times/Week): 4, Weekly Exercise (Minutes/Week): 240, Intensity: Moderate  Goals    . Patient Stated     Will try to get to the pool more  [facility]        Fall Risk Fall Risk  08/26/2017  Falls in the past year? 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 Influenza Split 06/24/2011, 05/30/2012  . Influenza Whole 09/06/2005, 06/05/2008, 06/09/2009  . Influenza,inj,Quad PF,6+ Mos 07/19/2013, 06/19/2014, 05/27/2016  . PPD Test 09/19/2013  . Pneumococcal Polysaccharide-23 09/06/2005, 12/29/2011, 07/19/2013  . Tdap 07/27/2013     Screening Tests Health Maintenance  Topic Date Due  . COLONOSCOPY  04/27/2015  . INFLUENZA VACCINE  04/06/2017  . TETANUS/TDAP  07/28/2023  . HIV Screening  Completed       Plan:     PCP Notes   Health Maintenance Took his flu vaccine today  Discussed colonoscopy - thought he had one at [facility]; will check and see and if not, will discuss with Dr. Raliegh Ip  Educated on colonoscopy vs cologuard  States he was tested for HIV x 3 years ago and was neg   Abnormal Screens  Neg medicare screens  Having a few new symptoms Tremor of the lip occasionally Right hand gets numb and fingers lock up  Also has noted issues with  constipation with bowel and will talk to Dr. Eliberto Ivory  Is fup with neurology at [facility] 12/26    Referrals  no  Patient concerns; As noted   Nurse Concerns; As noted   Next PCP apt To fup with Dr. Raliegh Ip for labs and review of maintenance meds      I have personally reviewed and noted the following in the patient's chart:   . Medical and social history . Use of alcohol, tobacco or illicit drugs  . Current medications and supplements . Functional ability and status . Nutritional status . Physical activity . Advanced directives . List of other physicians . Hospitalizations, surgeries, and ER visits in previous 12 months . Vitals . Screenings to include cognitive, depression, and falls . Referrals and appointments  In addition, I have reviewed and discussed with patient certain preventive protocols, quality metrics, and best practice recommendations. A written personalized care plan for preventive services as well as general preventive health recommendations were provided to patient.     XJDBZ,MCEYE, RN  08/26/2017  Agree with assessment as above.  Eulas Post MD [facility]

## 2017-08-26 NOTE — Patient Instructions (Addendum)
[name] , Thank you for taking time to come for your Medicare Wellness Visit. I appreciate your ongoing commitment to your health goals. Please review the following plan we discussed and let me know if I can assist you in the future.   Will make apt with Dr. Raliegh Ip to review medical; labs etc as well as colonoscopy after confirming if it was completed at [facility]   Will take the flu vaccine today   Please check with your [facility] md to see if you had a colonoscopy  there.  Can schedule if you had prior one there if needed at this time. You can also discuss with Dr. Raliegh Ip  Medicare Part B will cover the CologuardTM test once every three years for beneficiaries who meet all of the following criteria:  Age 20 to 26 years,  Asymptomatic (no signs or symptoms of colorectal disease including but not limited to lower gastrointestinal pain, blood in stool, positive guaiac fecal occult blood test or fecal immunochemical test), and  At average risk of developing colorectal cancer (no personal history of adenomatous polyps, colorectal cancer, or inflammatory bowel disease, including Crohn's Disease and ulcerative colitis; no family history of colorectal cancers or adenomatous polyps, familial adenomatous polyposis, or hereditary nonpolyposis colorectal cancer).   These are the goals we discussed: Goals    . Patient Stated     Will try to get to the pool more  May consider joining the [employer] for heated pool        This is a list of the screening recommended for you and due dates:  Health Maintenance  Topic Date Due  . Colon Cancer Screening  04/27/2015  . Flu Shot  04/06/2017  . Tetanus Vaccine  07/28/2023  . HIV Screening  Completed      Colonoscopy, Adult A colonoscopy is an exam to look at the entire large intestine. During the exam, a lubricated, bendable tube is inserted into the anus and then passed into the rectum, colon, and other parts of the large intestine. A colonoscopy is often done as a part of  normal colorectal screening or in response to certain symptoms, such as anemia, persistent diarrhea, abdominal pain, and blood in the stool. The exam can help screen for and diagnose medical problems, including:  Tumors.  Polyps.  Inflammation.  Areas of bleeding.  Tell a health care provider about:  Any allergies you have.  All medicines you are taking, including vitamins, herbs, eye drops, creams, and over-the-counter medicines.  Any problems you or family members have had with anesthetic medicines.  Any blood disorders you have.  Any surgeries you have had.  Any medical conditions you have.  Any problems you have had passing stool. What are the risks? Generally, this is a safe procedure. However, problems may occur, including:  Bleeding.  A tear in the intestine.  A reaction to medicines given during the exam.  Infection (rare).  What happens before the procedure? Eating and drinking restrictions Follow instructions from your health care provider about eating and drinking, which may include:  A few days before the procedure - follow a low-fiber diet. Avoid nuts, seeds, dried fruit, raw fruits, and vegetables.  1-3 days before the procedure - follow a clear liquid diet. Drink only clear liquids, such as clear broth or bouillon, black coffee or tea, clear juice, clear soft drinks or sports drinks, gelatin dessert, and popsicles. Avoid any liquids that contain red or purple dye.  On the day of the procedure -  do not eat or drink anything during the 2 hours before the procedure, or within the time period that your health care provider recommends.  Bowel prep If you were prescribed an oral bowel prep to clean out your colon:  Take it as told by your health care provider. Starting the day before your procedure, you will need to drink a large amount of medicated liquid. The liquid will cause you to have multiple loose stools until your stool is almost clear or light  green.  If your skin or anus gets irritated from diarrhea, you may use these to relieve the irritation: ? Medicated wipes, such as adult wet wipes with aloe and vitamin E. ? A skin soothing-product like petroleum jelly.  If you vomit while drinking the bowel prep, take a break for up to 60 minutes and then begin the bowel prep again. If vomiting continues and you cannot take the bowel prep without vomiting, call your health care provider.  General instructions  Ask your health care provider about changing or stopping your regular medicines. This is especially important if you are taking diabetes medicines or blood thinners.  Plan to have someone take you home from the hospital or clinic. What happens during the procedure?  An IV tube may be inserted into one of your veins.  You will be given medicine to help you relax (sedative).  To reduce your risk of infection: ? Your health care team will wash or sanitize their hands. ? Your anal area will be washed with soap.  You will be asked to lie on your side with your knees bent.  Your health care provider will lubricate a long, thin, flexible tube. The tube will have a camera and a light on the end.  The tube will be inserted into your anus.  The tube will be gently eased through your rectum and colon.  Air will be delivered into your colon to keep it open. You may feel some pressure or cramping.  The camera will be used to take images during the procedure.  A small tissue sample may be removed from your body to be examined under a microscope (biopsy). If any potential problems are found, the tissue will be sent to a lab for testing.  If small polyps are found, your health care provider may remove them and have them checked for cancer cells.  The tube that was inserted into your anus will be slowly removed. The procedure may vary among health care providers and hospitals. What happens after the procedure?  Your blood pressure,  heart rate, breathing rate, and blood oxygen level will be monitored until the medicines you were given have worn off.  Do not drive for 24 hours after the exam.  You may have a small amount of blood in your stool.  You may pass gas and have mild abdominal cramping or bloating due to the air that was used to inflate your colon during the exam.  It is up to you to get the results of your procedure. Ask your health care provider, or the department performing the procedure, when your results will be ready. This information is not intended to replace advice given to you by your health care provider. Make sure you discuss any questions you have with your health care provider. Document Released: 08/20/2000 Document Revised: 06/23/2016 Document Reviewed: 11/04/2015 Elsevier Interactive Patient Education  2018 [place] Prevention in the [place] can cause injuries. They can happen to people  of all ages. There are many things you can do to make your home safe and to help prevent falls. What can I do on the outside of my home?  Regularly fix the edges of walkways and driveways and fix any cracks.  Remove anything that might make you trip as you walk through a door, such as a raised step or threshold.  Trim any bushes or trees on the path to your home.  Use bright outdoor lighting.  Clear any walking paths of anything that might make someone trip, such as rocks or tools.  Regularly check to see if handrails are loose or broken. Make sure that both sides of any steps have handrails.  Any raised decks and porches should have guardrails on the edges.  Have any leaves, snow, or ice cleared regularly.  Use sand or salt on walking paths during winter.  Clean up any spills in your garage right away. This includes oil or grease spills. What can I do in the bathroom?  Use night lights.  Install grab bars by the toilet and in the tub and shower. Do not use towel bars as grab  bars.  Use non-skid mats or decals in the tub or shower.  If you need to sit down in the shower, use a plastic, non-slip stool.  Keep the floor dry. Clean up any water that spills on the floor as soon as it happens.  Remove soap buildup in the tub or shower regularly.  Attach bath mats securely with double-sided non-slip rug tape.  Do not have throw rugs and other things on the floor that can make you trip. What can I do in the bedroom?  Use night lights.  Make sure that you have a light by your bed that is easy to reach.  Do not use any sheets or blankets that are too big for your bed. They should not hang down onto the floor.  Have a firm chair that has side arms. You can use this for support while you get dressed.  Do not have throw rugs and other things on the floor that can make you trip. What can I do in the kitchen?  Clean up any spills right away.  Avoid walking on wet floors.  Keep items that you use a lot in easy-to-reach places.  If you need to reach something above you, use a strong step stool that has a grab bar.  Keep electrical cords out of the way.  Do not use floor polish or wax that makes floors slippery. If you must use wax, use non-skid floor wax.  Do not have throw rugs and other things on the floor that can make you trip. What can I do with my stairs?  Do not leave any items on the stairs.  Make sure that there are handrails on both sides of the stairs and use them. Fix handrails that are broken or loose. Make sure that handrails are as long as the stairways.  Check any carpeting to make sure that it is firmly attached to the stairs. Fix any carpet that is loose or worn.  Avoid having throw rugs at the top or bottom of the stairs. If you do have throw rugs, attach them to the floor with carpet tape.  Make sure that you have a light switch at the top of the stairs and the bottom of the stairs. If you do not have them, ask someone to add them for  you. What else can  I do to help prevent falls?  Wear shoes that: ? Do not have high heels. ? Have rubber bottoms. ? Are comfortable and fit you well. ? Are closed at the toe. Do not wear sandals.  If you use a stepladder: ? Make sure that it is fully opened. Do not climb a closed stepladder. ? Make sure that both sides of the stepladder are locked into place. ? Ask someone to hold it for you, if possible.  Clearly mark and make sure that you can see: ? Any grab bars or handrails. ? First and last steps. ? Where the edge of each step is.  Use tools that help you move around (mobility aids) if they are needed. These include: ? Canes. ? Walkers. ? Scooters. ? Crutches.  Turn on the lights when you go into a dark area. Replace any light bulbs as soon as they burn out.  Set up your furniture so you have a clear path. Avoid moving your furniture around.  If any of your floors are uneven, fix them.  If there are any pets around you, be aware of where they are.  Review your medicines with your doctor. Some medicines can make you feel dizzy. This can increase your chance of falling. Ask your doctor what other things that you can do to help prevent falls. This information is not intended to replace advice given to you by your health care provider. Make sure you discuss any questions you have with your health care provider. Document Released: 06/19/2009 Document Revised: 01/29/2016 Document Reviewed: 09/27/2014 Elsevier Interactive Patient Education  2018 [place] Maintenance, Male A healthy lifestyle and preventive care is important for your health and wellness. Ask your health care provider about what schedule of regular examinations is right for you. What should I know about weight and diet? Eat a Healthy Diet  Eat plenty of vegetables, fruits, whole grains, low-fat dairy products, and lean protein.  Do not eat a lot of foods high in solid fats, added sugars, or  salt.  Maintain a Healthy Weight Regular exercise can help you achieve or maintain a healthy weight. You should:  Do at least 150 minutes of exercise each week. The exercise should increase your heart rate and make you sweat (moderate-intensity exercise).  Do strength-training exercises at least twice a week.  Watch Your Levels of Cholesterol and Blood Lipids  Have your blood tested for lipids and cholesterol every 5 years starting at 52 years of age. If you are at high risk for heart disease, you should start having your blood tested when you are 52 years old. You may need to have your cholesterol levels checked more often if: ? Your lipid or cholesterol levels are high. ? You are older than 52 years of age. ? You are at high risk for heart disease.  What should I know about cancer screening? Many types of cancers can be detected early and may often be prevented. Lung Cancer  You should be screened every year for lung cancer if: ? You are a current smoker who has smoked for at least 30 years. ? You are a former smoker who has quit within the past 15 years.  Talk to your health care provider about your screening options, when you should start screening, and how often you should be screened.  Colorectal Cancer  Routine colorectal cancer screening usually begins at 52 years of age and should be repeated every 5-10 years until you are 52  years old. You may need to be screened more often if early forms of precancerous polyps or small growths are found. Your health care provider may recommend screening at an earlier age if you have risk factors for colon cancer.  Your health care provider may recommend using home test kits to check for hidden blood in the stool.  A small camera at the end of a tube can be used to examine your colon (sigmoidoscopy or colonoscopy). This checks for the earliest forms of colorectal cancer.  Prostate and Testicular Cancer  Depending on your age and overall  health, your health care provider may do certain tests to screen for prostate and testicular cancer.  Talk to your health care provider about any symptoms or concerns you have about testicular or prostate cancer.  Skin Cancer  Check your skin from head to toe regularly.  Tell your health care provider about any new moles or changes in moles, especially if: ? There is a change in a mole's size, shape, or color. ? You have a mole that is larger than a pencil eraser.  Always use sunscreen. Apply sunscreen liberally and repeat throughout the day.  Protect yourself by wearing long sleeves, pants, a wide-brimmed hat, and sunglasses when outside.  What should I know about heart disease, diabetes, and high blood pressure?  If you are 61-56 years of age, have your blood pressure checked every 3-5 years. If you are 62 years of age or older, have your blood pressure checked every year. You should have your blood pressure measured twice-once when you are at a hospital or clinic, and once when you are not at a hospital or clinic. Record the average of the two measurements. To check your blood pressure when you are not at a hospital or clinic, you can use: ? An automated blood pressure machine at a pharmacy. ? A home blood pressure monitor.  Talk to your health care provider about your target blood pressure.  If you are between 98-38 years old, ask your health care provider if you should take aspirin to prevent heart disease.  Have regular diabetes screenings by checking your fasting blood sugar level. ? If you are at a normal weight and have a low risk for diabetes, have this test once every three years after the age of 46. ? If you are overweight and have a high risk for diabetes, consider being tested at a younger age or more often.  A one-time screening for abdominal aortic aneurysm (AAA) by ultrasound is recommended for men aged 69-75 years who are current or former smokers. What should I know  about preventing infection? Hepatitis B If you have a higher risk for hepatitis B, you should be screened for this virus. Talk with your health care provider to find out if you are at risk for hepatitis B infection. Hepatitis C Blood testing is recommended for:  Everyone born from [birth year] through 1966.  Anyone with known risk factors for hepatitis C.  Sexually Transmitted Diseases (STDs)  You should be screened each year for STDs including gonorrhea and chlamydia if: ? You are sexually active and are younger than 52 years of age. ? You are older than 52 years of age and your health care provider tells you that you are at risk for this type of infection. ? Your sexual activity has changed since you were last screened and you are at an increased risk for chlamydia or gonorrhea. Ask your health care provider if you are  at risk.  Talk with your health care provider about whether you are at high risk of being infected with HIV. Your health care provider may recommend a prescription medicine to help prevent HIV infection.  What else can I do?  Schedule regular health, dental, and eye exams.  Stay current with your vaccines (immunizations).  Do not use any tobacco products, such as cigarettes, chewing tobacco, and e-cigarettes. If you need help quitting, ask your health care provider.  Limit alcohol intake to no more than 2 drinks per day. One drink equals 12 ounces of beer, 5 ounces of wine, or 1 ounces of hard liquor.  Do not use street drugs.  Do not share needles.  Ask your health care provider for help if you need support or information about quitting drugs.  Tell your health care provider if you often feel depressed.  Tell your health care provider if you have ever been abused or do not feel safe at home. This information is not intended to replace advice given to you by your health care provider. Make sure you discuss any questions you have with your health care  provider. Document Released: 02/19/2008 Document Revised: 04/21/2016 Document Reviewed: 05/27/2015 Elsevier Interactive Patient Education  [employer].

## 2017-08-31 DIAGNOSIS — Z6832 Body mass index (BMI) 32.0-32.9, adult: Secondary | ICD-10-CM | POA: Diagnosis not present

## 2017-08-31 DIAGNOSIS — D869 Sarcoidosis, unspecified: Secondary | ICD-10-CM | POA: Diagnosis not present

## 2017-08-31 DIAGNOSIS — R634 Abnormal weight loss: Secondary | ICD-10-CM | POA: Diagnosis not present

## 2017-08-31 DIAGNOSIS — R200 Anesthesia of skin: Secondary | ICD-10-CM | POA: Diagnosis not present

## 2017-08-31 DIAGNOSIS — D8689 Sarcoidosis of other sites: Secondary | ICD-10-CM | POA: Diagnosis not present

## 2017-08-31 DIAGNOSIS — Z79899 Other long term (current) drug therapy: Secondary | ICD-10-CM | POA: Diagnosis not present

## 2017-08-31 DIAGNOSIS — Z5181 Encounter for therapeutic drug level monitoring: Secondary | ICD-10-CM | POA: Diagnosis not present

## 2017-09-01 ENCOUNTER — Telehealth: Payer: Self-pay | Admitting: Family Medicine

## 2017-09-01 NOTE — Telephone Encounter (Signed)
Copied from [place] [phone]. Topic: Appointment Scheduling - Scheduling Inquiry for Clinic >> Sep 01, 2017  2:12 PM Aurelio Brash B wrote: Reason for CRM:  PT says he was to make a follow up visit with Dr Raliegh Ip after seeing rheumatologist,  after looking at the schedule I told him the next available slot is 1/10,  (other than same day slots)  He states that is too long as he will be back in school in [place].  Next Thursday  1/3   He wants to know if he can be worked in  before than

## 2017-09-05 DIAGNOSIS — R200 Anesthesia of skin: Secondary | ICD-10-CM | POA: Diagnosis not present

## 2017-09-05 DIAGNOSIS — D869 Sarcoidosis, unspecified: Secondary | ICD-10-CM | POA: Diagnosis not present

## 2017-09-07 NOTE — Telephone Encounter (Signed)
Called pt so that a same day appt could be made, no answer. Left voice mail to call office back.

## 2017-11-09 DIAGNOSIS — Z87442 Personal history of urinary calculi: Secondary | ICD-10-CM | POA: Diagnosis not present

## 2017-11-09 DIAGNOSIS — Z969 Presence of functional implant, unspecified: Secondary | ICD-10-CM | POA: Diagnosis not present

## 2017-11-09 DIAGNOSIS — C61 Malignant neoplasm of prostate: Secondary | ICD-10-CM | POA: Diagnosis not present

## 2017-11-09 DIAGNOSIS — M4326 Fusion of spine, lumbar region: Secondary | ICD-10-CM | POA: Diagnosis not present

## 2017-11-09 DIAGNOSIS — F17210 Nicotine dependence, cigarettes, uncomplicated: Secondary | ICD-10-CM | POA: Diagnosis not present

## 2017-11-09 DIAGNOSIS — N200 Calculus of kidney: Secondary | ICD-10-CM | POA: Diagnosis not present

## 2017-11-09 DIAGNOSIS — M545 Low back pain: Secondary | ICD-10-CM | POA: Diagnosis not present

## 2017-11-09 DIAGNOSIS — D8689 Sarcoidosis of other sites: Secondary | ICD-10-CM | POA: Diagnosis not present

## 2017-11-09 DIAGNOSIS — M791 Myalgia, unspecified site: Secondary | ICD-10-CM | POA: Diagnosis not present

## 2017-11-21 ENCOUNTER — Telehealth: Payer: Self-pay | Admitting: Internal Medicine

## 2017-11-21 DIAGNOSIS — Z1211 Encounter for screening for malignant neoplasm of colon: Secondary | ICD-10-CM

## 2017-11-21 NOTE — Telephone Encounter (Signed)
Copied from [place]. Topic: Referral - Request >> Nov 21, 2017  1:45 PM Robina Ade, Helene Kelp D wrote: Reason for CRM: Patient called and said that he would like a referral to have a colonoscopy done. He will be in town 3/25-12/10/17 and would like to have this done while he is here. Please call patient to discuss about this and see when he can have it done.

## 2017-11-22 ENCOUNTER — Other Ambulatory Visit: Payer: Self-pay | Admitting: Internal Medicine

## 2017-11-22 ENCOUNTER — Other Ambulatory Visit: Payer: Self-pay | Admitting: Cardiology

## 2017-11-22 NOTE — Telephone Encounter (Signed)
Ok to order referral? Please advise.

## 2017-11-22 NOTE — Telephone Encounter (Signed)
Okay for referral for colonoscopy.

## 2017-11-23 MED ORDER — ATENOLOL 50 MG PO TABS: 50.0000 mg | ORAL_TABLET | Freq: Every day | ORAL | 0 refills | Status: DC

## 2017-11-23 NOTE — Telephone Encounter (Signed)
Rx(s) sent to pharmacy electronically.  

## 2017-11-23 NOTE — Telephone Encounter (Signed)
Referral was made for colonoscopy and pt was called and made aware.

## 2017-11-24 MED ORDER — GABAPENTIN 300 MG PO CAPS: 300.0000 mg | ORAL_CAPSULE | Freq: Four times a day (QID) | ORAL | 0 refills | Status: DC

## 2017-11-24 MED ORDER — DULOXETINE HCL 30 MG PO CPEP: 30.0000 mg | ORAL_CAPSULE | Freq: Every day | ORAL | 0 refills | Status: DC

## 2017-11-28 ENCOUNTER — Other Ambulatory Visit: Payer: Self-pay | Admitting: Internal Medicine

## 2017-11-28 ENCOUNTER — Encounter: Payer: Self-pay | Admitting: Internal Medicine

## 2017-11-28 ENCOUNTER — Ambulatory Visit (INDEPENDENT_AMBULATORY_CARE_PROVIDER_SITE_OTHER): Payer: Medicare Other | Admitting: Internal Medicine

## 2017-11-28 VITALS — BP 110/70 | HR 70 | Temp 98.9°F | Wt 219.0 lb

## 2017-11-28 DIAGNOSIS — E7800 Pure hypercholesterolemia, unspecified: Secondary | ICD-10-CM

## 2017-11-28 DIAGNOSIS — E739 Lactose intolerance, unspecified: Secondary | ICD-10-CM | POA: Diagnosis not present

## 2017-11-28 DIAGNOSIS — C61 Malignant neoplasm of prostate: Secondary | ICD-10-CM | POA: Diagnosis not present

## 2017-11-28 DIAGNOSIS — E119 Type 2 diabetes mellitus without complications: Secondary | ICD-10-CM

## 2017-11-28 DIAGNOSIS — I421 Obstructive hypertrophic cardiomyopathy: Secondary | ICD-10-CM

## 2017-11-28 DIAGNOSIS — M549 Dorsalgia, unspecified: Secondary | ICD-10-CM

## 2017-11-28 DIAGNOSIS — G894 Chronic pain syndrome: Secondary | ICD-10-CM

## 2017-11-28 DIAGNOSIS — G8929 Other chronic pain: Secondary | ICD-10-CM | POA: Diagnosis not present

## 2017-11-28 DIAGNOSIS — G4733 Obstructive sleep apnea (adult) (pediatric): Secondary | ICD-10-CM | POA: Diagnosis not present

## 2017-11-28 DIAGNOSIS — I10 Essential (primary) hypertension: Secondary | ICD-10-CM

## 2017-11-28 DIAGNOSIS — E538 Deficiency of other specified B group vitamins: Secondary | ICD-10-CM | POA: Diagnosis not present

## 2017-11-28 LAB — CBC WITH DIFFERENTIAL/PLATELET
Basophils Absolute: 0 10*3/uL (ref 0.0–0.1)
Basophils Relative: 1.1 % (ref 0.0–3.0)
Eosinophils Absolute: 0.1 10*3/uL (ref 0.0–0.7)
Eosinophils Relative: 4 % (ref 0.0–5.0)
HCT: 41.1 % (ref 39.0–52.0)
Hemoglobin: 14.2 g/dL (ref 13.0–17.0)
Lymphocytes Relative: 24.8 % (ref 12.0–46.0)
Lymphs Abs: 0.9 10*3/uL (ref 0.7–4.0)
MCHC: 34.6 g/dL (ref 30.0–36.0)
MCV: 89.3 fl (ref 78.0–100.0)
Monocytes Absolute: 0.6 10*3/uL (ref 0.1–1.0)
Monocytes Relative: 18.2 % — ABNORMAL HIGH (ref 3.0–12.0)
Neutro Abs: 1.8 10*3/uL (ref 1.4–7.7)
Neutrophils Relative %: 51.9 % (ref 43.0–77.0)
Platelets: 304 10*3/uL (ref 150.0–400.0)
RBC: 4.6 Mil/uL (ref 4.22–5.81)
RDW: 14.7 % (ref 11.5–15.5)
WBC: 3.4 10*3/uL — ABNORMAL LOW (ref 4.0–10.5)

## 2017-11-28 LAB — COMPREHENSIVE METABOLIC PANEL
ALT: 15 U/L (ref 0–53)
AST: 18 U/L (ref 0–37)
Albumin: 4 g/dL (ref 3.5–5.2)
Alkaline Phosphatase: 46 U/L (ref 39–117)
BUN: 16 mg/dL (ref 6–23)
CO2: 29 mEq/L (ref 19–32)
Calcium: 9.2 mg/dL (ref 8.4–10.5)
Chloride: 106 mEq/L (ref 96–112)
Creatinine, Ser: 0.77 mg/dL (ref 0.40–1.50)
GFR: 136.13 mL/min (ref >60.00)
Glucose, Bld: 80 mg/dL (ref 70–99)
Potassium: 4.2 mEq/L (ref 3.5–5.1)
Sodium: 138 mEq/L (ref 135–145)
Total Bilirubin: 0.6 mg/dL (ref 0.2–1.2)
Total Protein: 6.8 g/dL (ref 6.0–8.3)

## 2017-11-28 LAB — LIPID PANEL
Cholesterol: 138 mg/dL (ref 0–200)
HDL: 43 mg/dL (ref >39.00)
LDL Cholesterol: 72 mg/dL (ref 0–99)
NonHDL: 95.34
Total CHOL/HDL Ratio: 3
Triglycerides: 115 mg/dL (ref 0.0–149.0)
VLDL: 23 mg/dL (ref 0.0–40.0)

## 2017-11-28 LAB — HEMOGLOBIN A1C: Hgb A1c MFr Bld: 5.4 % (ref 4.6–6.5)

## 2017-11-28 LAB — PSA: PSA: 0.02 ng/mL — ABNORMAL LOW (ref 0.10–4.00)

## 2017-11-28 LAB — TSH: TSH: 0.93 u[IU]/mL (ref 0.35–4.50)

## 2017-11-28 MED ORDER — HYDROCODONE-ACETAMINOPHEN 5-325 MG PO TABS: 1.0000 | ORAL_TABLET | Freq: Four times a day (QID) | ORAL | 0 refills | Status: AC | PRN

## 2017-11-28 NOTE — Progress Notes (Signed)
Subjective:    Patient ID: John Carol., male    DOB: Dec 09, 1965, 52 y.o.   MRN: [id number]  HPI  52 year old patient who has not been seen in about a year and a half.  He is followed by multiple subspecialties including neurology rheumatology ophthalmology urology sleep medicine orthopedic surgery as well as pain management. He has been on oxycodone in the past but has been out for about 3 months.  He spends considerable time in [place] where he was going to a funeral school in [place].  He anticipates completing this program in the fall. He has been followed by [place] [facility] and Dr. Assunta Curtis in the past and now wishes to be followed by [facility] He has a history of hypertrophic cardiomyopathy sarcoidosis obesity with OSA and essential hypertension.  He has a history of dyslipidemia and remains on statin therapy.  Past Medical History:  Diagnosis Date  . Anginal pain ([place])    s/p cardiac cath 07/19/13 showing NL coronaries, EF 50-55%  . Anxiety   . B12 DEFICIENCY 03/06/2007  . Cancer [facility])    prostate ca  . CARDIOMYOPATHY, IDIOPATHIC HYPERTROPHIC 03/06/2007  . DEPRESSION 03/06/2007  . DVT (deep venous thrombosis) ([place])   . Dysrhythmia    sees [name] for "palpitations"  . GERD 06/01/2007  . History of kidney stones   . HYPERCHOLESTEROLEMIA 03/06/2007  . HYPERTENSION 03/06/2007   [name]  . IMPAIRED GLUCOSE TOLERANCE 11/08/2008  . NEPHROLITHIASIS, HX OF 04/07/2010  . 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 OSTEOARTHRITIS 06/01/2007  . Palpitations 10/23/2007  . Peripheral vascular disease (H[facility]) 03   dvt  . Pneumonia    hx of  . Primary idiopathic hypertrophic cardiomyopathy ([place])   . Prostate cancer ([place])   . Shortness of breath    infreq  . SLEEP APNEA 03/06/2007   uses vpap, last sleep study 2011, Dr. Alanson Puls, at [facility], CHRONIC PAIN 03/06/2007     Social History   Socioeconomic History  . Marital status: Married    Spouse  name: Not on file  . Number of children: Not on file  . Years of education: Not on file  . Highest education level: Not on file  Occupational History  . Occupation: Architect: UNEMPLOYED  Social Needs  . Financial resource strain: Not on file  . Food insecurity:    Worry: Not on file    Inability: Not on file  . Transportation needs:    Medical: Not on file    Non-medical: Not on file  Tobacco Use  . Smoking status: Former Smoker    Packs/day: 0.50    Years: 3.00    Pack years: 1.50    Types: Cigarettes    Last attempt to quit: 09/07/1983    Years since quitting: 34.2  . Smokeless tobacco: Never Used  . Tobacco comment: brief history   Substance and Sexual Activity  . Alcohol use: No    Comment: occ  . Drug use: No  . Sexual activity: Yes  Lifestyle  . Physical activity:    Days per week: Not on file    Minutes per session: Not on file  . Stress: Not on file  Relationships  . Social connections:    Talks on phone: Not on file    Gets together: Not on file    Attends religious service: Not on file    Active member of club or organization: Not on file  Attends meetings of clubs or organizations: Not on file    Relationship status: Not on file  . Intimate partner violence:    Fear of current or ex partner: Not on file    Emotionally abused: Not on file    Physically abused: Not on file    Forced sexual activity: Not on file  Other Topics Concern  . Not on file  Social History Narrative  . Not on file    Past Surgical History:  Procedure Laterality Date  . CARDIAC CATHETERIZATION    . decompression and fusion     cervicothoracic  . EYE SURGERY Bilateral    blepharoplasty  . LEFT HEART CATHETERIZATION WITH CORONARY ANGIOGRAM N/A 07/19/2013   Procedure: LEFT HEART CATHETERIZATION WITH CORONARY ANGIOGRAM;  Surgeon: Jolaine Artist, MD;  Location: [facility] CATH LAB;  Service: Cardiovascular;  Laterality: N/A;  . LEG SKIN LESION  BIOPSY / EXCISION       left leg  . LUMBAR FUSION    . PARTIAL NEPHRECTOMY Left    bx  . PENILE PROSTHESIS IMPLANT    . PROSTATE SURGERY     robotic prostatectomy  . SPINAL CORD STIMULATOR INSERTION N/A 04/11/2014   Procedure: LUMBAR SPINAL CORD STIMULATOR INSERTION;  Surgeon: Newman Pies, MD;  Location: [facility] NEURO ORS;  Service: Neurosurgery;  Laterality: N/A;  . TONSILLECTOMY      Family History  Problem Relation Age of Onset  . Heart disease Mother   . Kidney disease Neg Hx        family hx   . Cancer Neg Hx        family hx prostate    Allergies  Allergen Reactions  . Adhesive [Tape] Other (See Comments)    Skin irritation  . Pollen Extract Other (See Comments)    Stuffy nose    Current Outpatient Medications on File Prior to Visit  Medication Sig Dispense Refill  . albuterol (PROVENTIL HFA;VENTOLIN HFA) 108 (90 BASE) MCG/ACT inhaler Inhale 2 puffs into the lungs 2 (two) times daily as needed for wheezing or shortness of breath.     Marland Kitchen amitriptyline (ELAVIL) 25 MG tablet TAKE 1 TABLET (25 MG TOTAL) BY MOUTH AT BEDTIME. 30 tablet 5  . atenolol (TENORMIN) 50 MG tablet Take 1 tablet (50 mg total) by mouth daily. <PLEASE MAKE APPOINTMENT FOR REFILLS> 30 tablet 0  . atorvastatin (LIPITOR) 40 MG tablet Take 40 mg by mouth daily.    Marland Kitchen azaTHIOprine (IMURAN) 50 MG tablet Take 250 mg by mouth daily.     Marland Kitchen buPROPion (WELLBUTRIN XL) 300 MG 24 hr tablet TAKE ONE TABLET BY MOUTH EVERY MORNING 90 tablet 1  . cyanocobalamin (,VITAMIN B-12,) 1000 MCG/ML injection Inject 1,000 mcg into the muscle every 30 (thirty) days. On or about the 1st of each month    . cycloSPORINE (RESTASIS) 0.05 % ophthalmic emulsion Place 2 drops into both eyes 2 (two) times daily.     Marland Kitchen dicyclomine (BENTYL) 10 MG capsule TAKE 1 CAPSULE BY MOUTH 4 TIMES DAILY 360 capsule 0  . DULoxetine (CYMBALTA) 30 MG capsule Take 1 capsule (30 mg total) by mouth daily. 90 capsule 0  . DULoxetine (CYMBALTA) 60 MG capsule TAKE ONE CAPSULE BY MOUTH  DAILY 90 capsule 1  . erythromycin ophthalmic ointment Place 1 application into both eyes at bedtime.     . gabapentin (NEURONTIN) 300 MG capsule Take 1 capsule (300 mg total) by mouth 4 (four) times daily. 360 capsule 0  . guaiFENesin (  MUCINEX) 600 MG 12 hr tablet Take 600 mg by mouth 2 (two) times daily.     Marland Kitchen lidocaine (XYLOCAINE) 5 % ointment Apply 1 application topically daily as needed for moderate pain.     Marland Kitchen NEXIUM 40 MG capsule TAKE ONE CAPSULE BY MOUTH DAILY 90 capsule 2  . oxybutynin (DITROPAN-XL) 5 MG 24 hr tablet Take 5 mg by mouth daily.     Marland Kitchen oxyCODONE-acetaminophen (PERCOCET) 10-325 MG tablet Take 1 tablet by mouth See admin instructions. Take 1 tablet by mouth every morning and 1 tablet before 6pm    . Polyethyl Glycol-Propyl Glycol (SYSTANE PRESERVATIVE FREE OP) Place 1 drop into both eyes 2 (two) times daily.    Marland Kitchen triamcinolone (NASACORT AQ) 55 MCG/ACT AERO nasal inhaler Place 1 spray into both nostrils daily as needed (dry mouth).     . triamcinolone ointment (KENALOG) 0.1 % Apply 1 application topically daily.      No current facility-administered medications on file prior to visit.     BP 110/70 (BP Location: Right Arm, Patient Position: Sitting, Cuff Size: Large)   Pulse 70   Temp 98.9 F (37.2 C) (Oral)   Wt 219 lb (99.3 kg)   SpO2 97%   BMI 32.34 kg/m     Review of Systems  Constitutional: Negative for appetite change, chills, fatigue and fever.  HENT: Negative for congestion, dental problem, ear pain, hearing loss, sore throat, tinnitus, trouble swallowing and voice change.   Eyes: Negative for pain, discharge and visual disturbance.  Respiratory: Negative for cough, chest tightness, wheezing and stridor.   Cardiovascular: Negative for chest pain, palpitations and leg swelling.  Gastrointestinal: Negative for abdominal distention, abdominal pain, blood in stool, constipation, diarrhea, nausea and vomiting.  Genitourinary: Negative for difficulty urinating,  discharge, flank pain, genital sores, hematuria and urgency.  Musculoskeletal: Positive for back pain. Negative for arthralgias, gait problem, joint swelling, myalgias and neck stiffness.  Skin: Negative for rash.  Neurological: Negative for dizziness, syncope, speech difficulty, weakness, numbness and headaches.  Hematological: Negative for adenopathy. Does not bruise/bleed easily.  Psychiatric/Behavioral: Negative for behavioral problems and dysphoric mood. The patient is not nervous/anxious.        Objective:   Physical Exam  Constitutional: He is oriented to person, place, and time. He appears well-developed.  Blood pressure well controlled  Weight 219  No distress  HENT:  Head: Normocephalic.  Right Ear: External ear normal.  Left Ear: External ear normal.  Eyes: Conjunctivae and EOM are normal.  Neck: Normal range of motion.  Cardiovascular: Normal rate and normal heart sounds.  Pulmonary/Chest: Breath sounds normal.  Abdominal: Bowel sounds are normal.  Musculoskeletal: Normal range of motion. He exhibits no edema or tenderness.  Neurological: He is alert and oriented to person, place, and time.  Psychiatric: He has a normal mood and affect. His behavior is normal.          Assessment & Plan:   Essential hypertension.  Well-controlled Dyslipidemia continue statin therapy History of sarcoidosis. Chronic pain will refer to [facility] per patient request Obesity with OSA.  Follow-up with pulmonary medicine  CPX 6 months.  Will check updated lab  [facility]

## 2017-11-28 NOTE — Patient Instructions (Signed)

## 2017-12-06 ENCOUNTER — Other Ambulatory Visit: Payer: Self-pay | Admitting: Internal Medicine

## 2017-12-07 ENCOUNTER — Telehealth: Payer: Self-pay | Admitting: Internal Medicine

## 2017-12-07 NOTE — Telephone Encounter (Signed)
Copied from [place] #[id number]. Topic: Quick Communication - Rx Refill/Question >> Dec 07, 2017  2:45 PM Boyd Kerbs wrote:  Medication: atorvastatin (LIPITOR) 40 MG tablet [facility]  Has the patient contacted their pharmacy? No.  (Agent: If no, request that the patient contact the pharmacy for the refill.) Preferred Pharmacy (with phone number or street name):   [facility] [address], [place] - [address] [place] [address] Phone: [phone] Fax: [phone]   Agent: Please be advised that RX refills may take up to 3 business days. We ask that you follow-up with your pharmacy.

## 2017-12-07 NOTE — Telephone Encounter (Signed)
Refill  Request Vit B12 1000 mcg / ml   Monthly injection  Last  Filled   According to  Records 04/18/2014  -  Historical provider  LOV 11/28/2017 -Pharmacy requested [place]

## 2017-12-07 NOTE — Telephone Encounter (Signed)
Copied from [place] #[id number]. Topic: Quick Communication - Rx Refill/Question >> Dec 07, 2017  2:47 PM Boyd Kerbs wrote:  Medication: cyanocobalamin (,VITAMIN B-12,) 1000 MCG/ML injection He needs this called into [facility]  Has the patient contacted their pharmacy? No.  (Agent: If no, request that the patient contact the pharmacy for the refill.) Preferred Pharmacy (with phone number or street name):   [facility], [address] [place] [address] Phone: [phone] Fax: [phone]   Agent: Please be advised that RX refills may take up to 3 business days. We ask that you follow-up with your pharmacy.

## 2017-12-07 NOTE — Telephone Encounter (Signed)
Okay to refill atorvastatin

## 2017-12-07 NOTE — Telephone Encounter (Signed)
/  Spoke to patient and informed him that the B-12 was not prescribed by PCP but can come in  To see if it can get filled after lab work. Patient verbalized understanding and stated that he will go to the doctor that may have prescribed it. Patient is now asking for refill on Lipitor that wasn't prescribed by PCP but patient was seen 11/22/2017 in the office and done blood work. Informed patient that I will return phone call to see if PCP will send refill.

## 2017-12-07 NOTE — Telephone Encounter (Signed)
Also, okay to refill vitamin B12

## 2017-12-08 ENCOUNTER — Other Ambulatory Visit: Payer: Self-pay

## 2017-12-08 DIAGNOSIS — D8689 Sarcoidosis of other sites: Secondary | ICD-10-CM | POA: Diagnosis not present

## 2017-12-08 DIAGNOSIS — D869 Sarcoidosis, unspecified: Secondary | ICD-10-CM | POA: Diagnosis not present

## 2017-12-08 DIAGNOSIS — R202 Paresthesia of skin: Secondary | ICD-10-CM | POA: Diagnosis not present

## 2017-12-08 DIAGNOSIS — R21 Rash and other nonspecific skin eruption: Secondary | ICD-10-CM | POA: Diagnosis not present

## 2017-12-08 DIAGNOSIS — Z79899 Other long term (current) drug therapy: Secondary | ICD-10-CM | POA: Diagnosis not present

## 2017-12-08 DIAGNOSIS — D860 Sarcoidosis of lung: Secondary | ICD-10-CM | POA: Diagnosis not present

## 2017-12-08 DIAGNOSIS — D863 Sarcoidosis of skin: Secondary | ICD-10-CM | POA: Diagnosis not present

## 2017-12-08 DIAGNOSIS — R200 Anesthesia of skin: Secondary | ICD-10-CM | POA: Diagnosis not present

## 2017-12-08 MED ORDER — CYANOCOBALAMIN 1000 MCG/ML IJ SOLN: 1000.0000 ug | INTRAMUSCULAR | 0 refills | Status: DC

## 2017-12-08 MED ORDER — ATORVASTATIN CALCIUM 40 MG PO TABS: 40.0000 mg | ORAL_TABLET | Freq: Every day | ORAL | 0 refills | Status: DC

## 2017-12-08 NOTE — Telephone Encounter (Signed)
Patient aware medication sent to Preferred pharmacy.

## 2017-12-09 ENCOUNTER — Other Ambulatory Visit: Payer: Self-pay | Admitting: Cardiology

## 2017-12-09 NOTE — Telephone Encounter (Signed)
Rx has been sent to the pharmacy electronically. ° °

## 2017-12-28 DIAGNOSIS — C61 Malignant neoplasm of prostate: Secondary | ICD-10-CM | POA: Diagnosis not present

## 2017-12-28 DIAGNOSIS — Z8546 Personal history of malignant neoplasm of prostate: Secondary | ICD-10-CM | POA: Diagnosis not present

## 2017-12-28 DIAGNOSIS — N281 Cyst of kidney, acquired: Secondary | ICD-10-CM | POA: Diagnosis not present

## 2017-12-28 DIAGNOSIS — Z905 Acquired absence of kidney: Secondary | ICD-10-CM | POA: Diagnosis not present

## 2017-12-28 DIAGNOSIS — Z87442 Personal history of urinary calculi: Secondary | ICD-10-CM | POA: Diagnosis not present

## 2017-12-28 DIAGNOSIS — R109 Unspecified abdominal pain: Secondary | ICD-10-CM | POA: Diagnosis not present

## 2017-12-28 DIAGNOSIS — Z85528 Personal history of other malignant neoplasm of kidney: Secondary | ICD-10-CM | POA: Diagnosis not present

## 2018-01-04 ENCOUNTER — Other Ambulatory Visit: Payer: Self-pay | Admitting: Internal Medicine

## 2018-01-13 ENCOUNTER — Ambulatory Visit (AMBULATORY_SURGERY_CENTER): Payer: Self-pay | Admitting: *Deleted

## 2018-01-13 ENCOUNTER — Other Ambulatory Visit: Payer: Self-pay

## 2018-01-13 VITALS — Ht 69.0 in | Wt 212.0 lb

## 2018-01-13 DIAGNOSIS — Z1211 Encounter for screening for malignant neoplasm of colon: Secondary | ICD-10-CM

## 2018-01-13 MED ORDER — PEG 3350-KCL-NA BICARB-NACL 420 G PO SOLR: 4000.0000 mL | Freq: Once | ORAL | 0 refills | Status: AC

## 2018-01-13 NOTE — Progress Notes (Signed)
No egg or soy allergy known to patient  No issues with past sedation with any surgeries  or procedures, no intubation problems  No diet pills per patient No home 02 use per patient  No blood thinners per patient  Pt denies issues with constipation  No A fib or A flutter  EMMI video sent to pt's e mail  

## 2018-01-20 ENCOUNTER — Encounter: Payer: Self-pay | Admitting: Gastroenterology

## 2018-01-20 ENCOUNTER — Ambulatory Visit (AMBULATORY_SURGERY_CENTER): Payer: Medicare Other | Admitting: Gastroenterology

## 2018-01-20 ENCOUNTER — Other Ambulatory Visit: Payer: Self-pay

## 2018-01-20 VITALS — BP 116/74 | HR 69 | Temp 99.3°F | Resp 22 | Ht 69.0 in | Wt 212.0 lb

## 2018-01-20 DIAGNOSIS — Z1211 Encounter for screening for malignant neoplasm of colon: Secondary | ICD-10-CM | POA: Diagnosis not present

## 2018-01-20 DIAGNOSIS — D123 Benign neoplasm of transverse colon: Secondary | ICD-10-CM

## 2018-01-20 MED ORDER — SODIUM CHLORIDE 0.9 % IV SOLN: 500.0000 mL | Freq: Once | INTRAVENOUS | Status: AC

## 2018-01-20 NOTE — Patient Instructions (Signed)
  THANK YOU FOR ALLOWING [place] TO CARE FOR YOU TODAY!  Await pathology results by mail, approx 2 weeks.  Follow-up colonoscopy dependent on pathology results.  Handout given for polyps.   YOU HAD AN ENDOSCOPIC PROCEDURE TODAY AT [facility]:   Refer to the procedure report that was given to you for any specific questions about what was found during the examination.  If the procedure report does not answer your questions, please call your gastroenterologist to clarify.  If you requested that your care partner not be given the details of your procedure findings, then the procedure report has been included in a sealed envelope for you to review at your convenience later.  YOU SHOULD EXPECT: Some feelings of bloating in the abdomen. Passage of more gas than usual.  Walking can help get rid of the air that was put into your GI tract during the procedure and reduce the bloating. If you had a lower endoscopy (such as a colonoscopy or flexible sigmoidoscopy) you may notice spotting of blood in your stool or on the toilet paper. If you underwent a bowel prep for your procedure, you may not have a normal bowel movement for a few days.  Please Note:  You might notice some irritation and congestion in your nose or some drainage.  This is from the oxygen used during your procedure.  There is no need for concern and it should clear up in a day or so.  SYMPTOMS TO REPORT IMMEDIATELY:   Following lower endoscopy (colonoscopy or flexible sigmoidoscopy):  Excessive amounts of blood in the stool  Significant tenderness or worsening of abdominal pains  Swelling of the abdomen that is new, acute  Fever of 100F or higher   For urgent or emergent issues, a gastroenterologist can be reached at any hour by calling [phone].   DIET:  We do recommend a small meal at first, but then you may proceed to your regular diet.  Drink plenty of fluids but you should avoid alcoholic beverages for 24  hours.  ACTIVITY:  You should plan to take it easy for the rest of today and you should NOT DRIVE or use heavy machinery until tomorrow (because of the sedation medicines used during the test).    FOLLOW UP: Our staff will call the number listed on your records the next business day following your procedure to check on you and address any questions or concerns that you may have regarding the information given to you following your procedure. If we do not reach you, we will leave a message.  However, if you are feeling well and you are not experiencing any problems, there is no need to return our call.  We will assume that you have returned to your regular daily activities without incident.  If any biopsies were taken you will be contacted by phone or by letter within the next 1-3 weeks.  Please call us at [phone] if you have not heard about the biopsies in 3 weeks.    SIGNATURES/CONFIDENTIALITY: You and/or your care partner have signed paperwork which will be entered into your electronic medical record.  These signatures attest to the fact that that the information above on your After Visit Summary has been reviewed and is understood.  Full responsibility of the confidentiality of this discharge information lies with you and/or your care-partner.

## 2018-01-20 NOTE — Op Note (Signed)
[facility] Patient Name: [name] Procedure Date: 01/20/2018 12:18 PM MRN: [id number] Endoscopist: Mallie Mussel L. Loletha Carrow , MD Age: 52 Referring MD:  Date of Birth: 02/25/1966 Gender: Male Account #: [id number] Procedure:                Colonoscopy Indications:              Screening for colorectal malignant neoplasm, This                            is the patient's first colonoscopy Medicines:                Monitored Anesthesia Care Procedure:                Pre-Anesthesia Assessment:                           - Prior to the procedure, a History and Physical                            was performed, and patient medications and                            allergies were reviewed. The patient's tolerance of                            previous anesthesia was also reviewed. The risks                            and benefits of the procedure and the sedation                            options and risks were discussed with the patient.                            All questions were answered, and informed consent                            was obtained. Prior Anticoagulants: The patient has                            taken no previous anticoagulant or antiplatelet                            agents. ASA Grade Assessment: II - A patient with                            mild systemic disease. After reviewing the risks                            and benefits, the patient was deemed in                            satisfactory condition to undergo the procedure.  After obtaining informed consent, the colonoscope                            was passed under direct vision. Throughout the                            procedure, the patient's blood pressure, pulse, and                            oxygen saturations were monitored continuously. The                            Colonoscope was introduced through the anus and                            advanced to the the cecum,  identified by                            appendiceal orifice and ileocecal valve. The                            colonoscopy was performed without difficulty. The                            patient tolerated the procedure well. The quality                            of the bowel preparation was excellent. The                            ileocecal valve, appendiceal orifice, and rectum                            were photographed. The quality of the bowel                            preparation was evaluated using the BBPS [facility]                            Bowel Preparation Scale) with scores of: Right                            Colon = 3, Transverse Colon = 3 and Left Colon = 3                            (entire mucosa seen well with no residual staining,                            small fragments of stool or opaque liquid). The                            total BBPS score equals 9. Scope In: 12:19:42 PM Scope Out: 12:34:33 PM Scope Withdrawal Time: 0 hours 9  minutes 55 seconds  Total Procedure Duration: 0 hours 14 minutes 51 seconds  Findings:                 The perianal and digital rectal examinations were                            normal.                           A 6 mm polyp was found in the proximal transverse                            colon. The polyp was sessile. The polyp was removed                            with a cold snare. Resection and retrieval were                            complete.                           The exam was otherwise without abnormality on                            direct and retroflexion views. Complications:            No immediate complications. Estimated Blood Loss:     Estimated blood loss was minimal. Impression:               - One 6 mm polyp in the proximal transverse colon,                            removed with a cold snare. Resected and retrieved.                           - The examination was otherwise normal on direct                             and retroflexion views. Recommendation:           - Patient has a contact number available for                            emergencies. The signs and symptoms of potential                            delayed complications were discussed with the                            patient. Return to normal activities tomorrow.                            Written discharge instructions were provided to the                            patient.                           -  Resume previous diet.                           - Continue present medications.                           - Await pathology results.                           - Repeat colonoscopy is recommended for                            surveillance. The colonoscopy date will be                            determined after pathology results from today's                            exam become available for review. Henry L. Loletha Carrow, MD 01/20/2018 12:37:03 PM This report has been signed electronically.

## 2018-01-20 NOTE — Progress Notes (Signed)
Called to room to assist during endoscopic procedure.  Patient ID and intended procedure confirmed with present staff. Received instructions for my participation in the procedure from the performing physician.  

## 2018-01-20 NOTE — Progress Notes (Signed)
A and O x3. Report to RN. Tolerated MAC anesthesia well.

## 2018-01-23 ENCOUNTER — Telehealth: Payer: Self-pay

## 2018-01-23 NOTE — Telephone Encounter (Signed)
  Follow up Call-  Call back number 01/20/2018  Post procedure Call Back phone  # [phone]  Permission to leave phone message Yes  Some recent data might be hidden     Patient questions:  Do you have a fever, pain , or abdominal swelling? No. Pain Score  0 *  Have you tolerated food without any problems? Yes.    Have you been able to return to your normal activities? Yes.    Do you have any questions about your discharge instructions: Diet   No. Medications  No. Follow up visit  No.  Do you have questions or concerns about your Care? No.  Actions: * If pain score is 4 or above: No action needed, pain <4.

## 2018-02-01 ENCOUNTER — Encounter: Payer: Self-pay | Admitting: Gastroenterology

## 2018-02-02 DIAGNOSIS — M545 Low back pain: Secondary | ICD-10-CM | POA: Diagnosis not present

## 2018-02-02 DIAGNOSIS — M542 Cervicalgia: Secondary | ICD-10-CM | POA: Diagnosis not present

## 2018-02-02 DIAGNOSIS — M5417 Radiculopathy, lumbosacral region: Secondary | ICD-10-CM | POA: Diagnosis not present

## 2018-02-14 DIAGNOSIS — M5416 Radiculopathy, lumbar region: Secondary | ICD-10-CM | POA: Diagnosis not present

## 2018-02-28 ENCOUNTER — Ambulatory Visit: Payer: Medicare Other | Admitting: Internal Medicine

## 2018-02-28 DIAGNOSIS — Z0289 Encounter for other administrative examinations: Secondary | ICD-10-CM

## 2018-03-16 DIAGNOSIS — M5417 Radiculopathy, lumbosacral region: Secondary | ICD-10-CM | POA: Diagnosis not present

## 2018-03-16 DIAGNOSIS — G894 Chronic pain syndrome: Secondary | ICD-10-CM | POA: Diagnosis not present

## 2018-03-16 DIAGNOSIS — M545 Low back pain: Secondary | ICD-10-CM | POA: Diagnosis not present

## 2018-03-24 ENCOUNTER — Other Ambulatory Visit: Payer: Self-pay | Admitting: Cardiology

## 2018-03-27 DIAGNOSIS — D8682 Multiple cranial nerve palsies in sarcoidosis: Secondary | ICD-10-CM | POA: Diagnosis not present

## 2018-03-27 DIAGNOSIS — M961 Postlaminectomy syndrome, not elsewhere classified: Secondary | ICD-10-CM | POA: Diagnosis not present

## 2018-03-29 DIAGNOSIS — M961 Postlaminectomy syndrome, not elsewhere classified: Secondary | ICD-10-CM | POA: Diagnosis not present

## 2018-03-29 DIAGNOSIS — D8682 Multiple cranial nerve palsies in sarcoidosis: Secondary | ICD-10-CM | POA: Diagnosis not present

## 2018-04-03 DIAGNOSIS — D8682 Multiple cranial nerve palsies in sarcoidosis: Secondary | ICD-10-CM | POA: Diagnosis not present

## 2018-04-03 DIAGNOSIS — M961 Postlaminectomy syndrome, not elsewhere classified: Secondary | ICD-10-CM | POA: Diagnosis not present

## 2018-04-05 DIAGNOSIS — D8682 Multiple cranial nerve palsies in sarcoidosis: Secondary | ICD-10-CM | POA: Diagnosis not present

## 2018-04-05 DIAGNOSIS — M961 Postlaminectomy syndrome, not elsewhere classified: Secondary | ICD-10-CM | POA: Diagnosis not present

## 2018-04-10 DIAGNOSIS — M961 Postlaminectomy syndrome, not elsewhere classified: Secondary | ICD-10-CM | POA: Diagnosis not present

## 2018-04-10 DIAGNOSIS — D8682 Multiple cranial nerve palsies in sarcoidosis: Secondary | ICD-10-CM | POA: Diagnosis not present

## 2018-04-12 DIAGNOSIS — M961 Postlaminectomy syndrome, not elsewhere classified: Secondary | ICD-10-CM | POA: Diagnosis not present

## 2018-04-12 DIAGNOSIS — D8682 Multiple cranial nerve palsies in sarcoidosis: Secondary | ICD-10-CM | POA: Diagnosis not present

## 2018-04-17 DIAGNOSIS — D8682 Multiple cranial nerve palsies in sarcoidosis: Secondary | ICD-10-CM | POA: Diagnosis not present

## 2018-04-17 DIAGNOSIS — M961 Postlaminectomy syndrome, not elsewhere classified: Secondary | ICD-10-CM | POA: Diagnosis not present

## 2018-04-19 DIAGNOSIS — M961 Postlaminectomy syndrome, not elsewhere classified: Secondary | ICD-10-CM | POA: Diagnosis not present

## 2018-04-19 DIAGNOSIS — D8682 Multiple cranial nerve palsies in sarcoidosis: Secondary | ICD-10-CM | POA: Diagnosis not present

## 2018-04-20 DIAGNOSIS — M5417 Radiculopathy, lumbosacral region: Secondary | ICD-10-CM | POA: Diagnosis not present

## 2018-04-20 DIAGNOSIS — M5416 Radiculopathy, lumbar region: Secondary | ICD-10-CM | POA: Diagnosis not present

## 2018-04-24 DIAGNOSIS — M961 Postlaminectomy syndrome, not elsewhere classified: Secondary | ICD-10-CM | POA: Diagnosis not present

## 2018-04-24 DIAGNOSIS — D8682 Multiple cranial nerve palsies in sarcoidosis: Secondary | ICD-10-CM | POA: Diagnosis not present

## 2018-04-28 DIAGNOSIS — D869 Sarcoidosis, unspecified: Secondary | ICD-10-CM | POA: Diagnosis not present

## 2018-05-01 DIAGNOSIS — M961 Postlaminectomy syndrome, not elsewhere classified: Secondary | ICD-10-CM | POA: Diagnosis not present

## 2018-05-01 DIAGNOSIS — D8682 Multiple cranial nerve palsies in sarcoidosis: Secondary | ICD-10-CM | POA: Diagnosis not present

## 2018-05-03 DIAGNOSIS — D8682 Multiple cranial nerve palsies in sarcoidosis: Secondary | ICD-10-CM | POA: Diagnosis not present

## 2018-05-03 DIAGNOSIS — M961 Postlaminectomy syndrome, not elsewhere classified: Secondary | ICD-10-CM | POA: Diagnosis not present

## 2018-05-10 DIAGNOSIS — D8682 Multiple cranial nerve palsies in sarcoidosis: Secondary | ICD-10-CM | POA: Diagnosis not present

## 2018-05-10 DIAGNOSIS — M961 Postlaminectomy syndrome, not elsewhere classified: Secondary | ICD-10-CM | POA: Diagnosis not present

## 2018-05-17 DIAGNOSIS — D8682 Multiple cranial nerve palsies in sarcoidosis: Secondary | ICD-10-CM | POA: Diagnosis not present

## 2018-05-17 DIAGNOSIS — M961 Postlaminectomy syndrome, not elsewhere classified: Secondary | ICD-10-CM | POA: Diagnosis not present

## 2018-06-06 ENCOUNTER — Ambulatory Visit (INDEPENDENT_AMBULATORY_CARE_PROVIDER_SITE_OTHER): Payer: Medicare Other | Admitting: Cardiology

## 2018-06-06 ENCOUNTER — Encounter: Payer: Self-pay | Admitting: Cardiology

## 2018-06-06 VITALS — BP 120/70 | HR 64 | Ht 69.0 in | Wt 215.8 lb

## 2018-06-06 DIAGNOSIS — E785 Hyperlipidemia, unspecified: Secondary | ICD-10-CM | POA: Diagnosis not present

## 2018-06-06 DIAGNOSIS — R079 Chest pain, unspecified: Secondary | ICD-10-CM

## 2018-06-06 LAB — TROPONIN T: Troponin T TROPT: <0.011 ng/mL (ref <0.011)

## 2018-06-06 MED ORDER — ATORVASTATIN CALCIUM 40 MG PO TABS: 40.0000 mg | ORAL_TABLET | Freq: Every day | ORAL | 3 refills | Status: DC

## 2018-06-06 MED ORDER — ATENOLOL 50 MG PO TABS: 50.0000 mg | ORAL_TABLET | Freq: Every day | ORAL | 3 refills | Status: DC

## 2018-06-06 NOTE — Progress Notes (Signed)
06/06/2018 John Manns Jr.   02-01-1966  [id number]  Primary Physician Marletta Lor, MD Primary Cardiologist: [name]   Reason for Visit/CC: f/u for medication refills, h/o PVCs, new complaint of CP  HPI: 52 y/o AAM followed by [name], but not seen in over 2 years. He has been followed in the past at [facility] for pulmonary and neurosarcoidosis. He has a hx of idiopathic hypertrophic cardiomyopathy, depression, HLD, GERD, HTN, and glucose intolerance. He was seen for chest pain by cardiology in 12/2011. Myoview 12/29/11: No ischemia, EF 66%. He was admitted 3/28-4/1 with chest pain. He was again seen by cardiology. Symptoms were felt to be atypical for ischemia. Cardiac markers were negative. D-dimer was negative. Echocardiogram 12/01/12: Mild focal basal hypertrophy the septum, EF 09-47%, grade 1 diastolic dysfunction, mild LAE. Patient had palpitations. He had one ventricular couplet on telemetry. In 07/2013, he ws revaluated for CP and ultimately underwent a LHC which showed normal coronaries. EF was 50-55%. His CP was noncardiac. He was lost to f/u for a period of time and was being followed some at [facility] in the past with cardiac MRIs as there was concern for possible sarcoid cardiac involvement. However he ultimately had a spinal stimulator implanted, thus he was unable to get f/u MRIs. As far as he knows, no diagnosis of cardiac sarcoid was ever made.   I evaluated him in June 2016 for palpitations and CP. W/u conducted, including NST, 2D echo and 30 day monitor. NST was low risk. No ischemia. 2D echo showed normal LVEF of 60-65%. G1DD. Normal LV size with moderate focal basal septal hypertrophy. No LV outflow tract gradient. Wall motion was normal; there were no regional wall motion abnormalities. There appeared to be chordal but not valvular SAM. There was no significant mitral regurgitation. His cardiac monitor findings were interpreted by [name].  This showed mainly NSR with PVCs. We instructed him to increase his atenolol to 50 mg daily. In further discussion, pt had also noted that he had been drinking energy drinks daily. He was drinking [employer] 20 oz drinks. No ETOH. Advised to lay off caffeine and f/u with [name] in 6 months. Pt never followed up. He is back today after 2 years needing medication refills.   In in addition to med refill request of BB, he notes new left sided chest pain, radiation to left axilla. Started last night at approximately 11:30 PM. Feels like pressure. Last meal prior to onset of symptoms was around 6:30 PM. Meal consisted of baked chicken and bread. Felt tired but no chest pain and decided to go to bed 1 hr after eating, ~7:30 PM. Woke with CP 4 hrs later. No other associated symptoms. No exertional component. No exertional dyspnea, n/v or diaphoresis. Pain has been present ever since last PM. Now with some radiation to the left neck, mild. EKG obtained during active CP and shows NSR. No ST abnormalities. BP is stable. Pt admits poor compliance with Nexium and advised to resume. Checking STAT troponin.   Unfortunately, he has also picked up smoking after quitting several years ago. Smokes about 1/2 ppd. Also vapes.    Current Meds  Medication Sig  . albuterol (PROVENTIL HFA;VENTOLIN HFA) 108 (90 BASE) MCG/ACT inhaler Inhale 2 puffs into the lungs 2 (two) times daily as needed for wheezing or shortness of breath.   Marland Kitchen amitriptyline (ELAVIL) 25 MG tablet TAKE 1 TABLET (25 MG TOTAL) BY MOUTH AT BEDTIME.  Marland Kitchen  atenolol (TENORMIN) 50 MG tablet Take 1 tablet (50 mg total) by mouth daily.  Marland Kitchen atorvastatin (LIPITOR) 40 MG tablet Take 1 tablet (40 mg total) by mouth daily.  Marland Kitchen azaTHIOprine (IMURAN) 50 MG tablet Take 250 mg by mouth daily.   Marland Kitchen buPROPion (WELLBUTRIN XL) 300 MG 24 hr tablet TAKE ONE TABLET BY MOUTH EVERY MORNING  . cyanocobalamin (,VITAMIN B-12,) 1000 MCG/ML injection INJECT ONE MILLILITER INJECTION   INTO THE MUSCLE EVERY 30 DAYS, ON OR AFTER THE FIRST OF EACH MONTH AS DIRECTED  . cyclobenzaprine (FLEXERIL) 10 MG tablet Take 1 tablet by mouth daily as needed.  . cycloSPORINE (RESTASIS) 0.05 % ophthalmic emulsion Place 2 drops into both eyes 2 (two) times daily.   Marland Kitchen dicyclomine (BENTYL) 10 MG capsule TAKE 1 CAPSULE BY MOUTH 4 TIMES DAILY  . DULoxetine (CYMBALTA) 30 MG capsule Take 1 capsule (30 mg total) by mouth daily.  . DULoxetine (CYMBALTA) 60 MG capsule TAKE ONE CAPSULE BY MOUTH DAILY  . erythromycin ophthalmic ointment Place 1 application into both eyes at bedtime.   . gabapentin (NEURONTIN) 300 MG capsule TAKE ONE CAPSULE BY MOUTH FOUR TIMES A DAY  . guaiFENesin (MUCINEX) 600 MG 12 hr tablet Take 600 mg by mouth 2 (two) times daily.   Marland Kitchen lidocaine (XYLOCAINE) 5 % ointment Apply 1 application topically daily as needed for moderate pain.   Marland Kitchen NEXIUM 40 MG capsule TAKE ONE CAPSULE BY MOUTH DAILY  . oxyCODONE-acetaminophen (PERCOCET) 10-325 MG tablet Take 1 tablet by mouth See admin instructions. Take 1 tablet by mouth every morning and 1 tablet before 6pm  . Polyethyl Glycol-Propyl Glycol (SYSTANE PRESERVATIVE FREE OP) Place 1 drop into both eyes 2 (two) times daily.  Marland Kitchen triamcinolone (NASACORT AQ) 55 MCG/ACT AERO nasal inhaler Place 1 spray into both nostrils daily as needed (dry mouth).   . triamcinolone ointment (KENALOG) 0.1 % Apply 1 application topically daily.   . [DISCONTINUED] atenolol (TENORMIN) 50 MG tablet Take 1 tablet (50 mg total) by mouth daily. Pt needs to schedule appt for further refills. 3rd attempt  . [DISCONTINUED] atorvastatin (LIPITOR) 40 MG tablet TAKE ONE TABLET BY MOUTH DAILY   Current Facility-Administered Medications for the 06/06/18 encounter (Office Visit) with Consuelo Pandy, PA-C  Medication  . 0.9 %  sodium chloride infusion   Allergies  Allergen Reactions  . Adhesive [Tape] Other (See Comments)    Skin irritation  . Pollen Extract Other (See  Comments)    Stuffy nose   Past Medical History:  Diagnosis Date  . Allergy   . Anginal pain ([place])    s/p cardiac cath 07/19/13 showing NL coronaries, EF 50-55%  . Anxiety   . Asthma    as child  . B12 DEFICIENCY 03/06/2007  . Cancer [facility])    prostate ca  . CARDIOMYOPATHY, IDIOPATHIC HYPERTROPHIC 03/06/2007  . Clotting disorder (HCC)    knee   . DEPRESSION 03/06/2007  . DVT (deep venous thrombosis) ([place])   . Dysrhythmia    sees [name] for "palpitations"  . GERD 06/01/2007  . History of kidney stones   . HYPERCHOLESTEROLEMIA 03/06/2007  . HYPERTENSION 03/06/2007   [name]  . IMPAIRED GLUCOSE TOLERANCE 11/08/2008  . NEPHROLITHIASIS, HX OF 04/07/2010  . 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 OSTEOARTHRITIS 06/01/2007  . Palpitations 10/23/2007  . Peripheral vascular disease (HCC) 03   dvt  . Pneumonia    hx of  . Primary idiopathic hypertrophic cardiomyopathy ([place])   . Prostate cancer ([place])   .  Shortness of breath    infreq  . SLEEP APNEA 03/06/2007   uses vpap, last sleep study 2011, Dr. Alanson Puls, at [facility]  . Sleep apnea    wears cpap  . SYNDROME, CHRONIC PAIN 03/06/2007   Family History  Problem Relation Age of Onset  . Heart disease Mother   . Kidney disease Neg Hx        family hx   . Cancer Neg Hx        family hx prostate  . Colon cancer Neg Hx   . Colon polyps Neg Hx   . Rectal cancer Neg Hx   . Stomach cancer Neg Hx    Past Surgical History:  Procedure Laterality Date  . CARDIAC CATHETERIZATION    . decompression and fusion     cervicothoracic  . EYE SURGERY Bilateral    blepharoplasty  . LEFT HEART CATHETERIZATION WITH CORONARY ANGIOGRAM N/A 07/19/2013   Procedure: LEFT HEART CATHETERIZATION WITH CORONARY ANGIOGRAM;  Surgeon: Jolaine Artist, MD;  Location: [facility] CATH LAB;  Service: Cardiovascular;  Laterality: N/A;  . LEG SKIN LESION  BIOPSY / EXCISION     left leg  . LUMBAR FUSION    . PARTIAL NEPHRECTOMY Left    bx  . PENILE PROSTHESIS  IMPLANT    . PROSTATE SURGERY     robotic prostatectomy  . SPINAL CORD STIMULATOR INSERTION N/A 04/11/2014   Procedure: LUMBAR SPINAL CORD STIMULATOR INSERTION;  Surgeon: Newman Pies, MD;  Location: [place] NEURO ORS;  Service: Neurosurgery;  Laterality: N/A;  . TONSILLECTOMY    . UPPER GASTROINTESTINAL ENDOSCOPY  2009   Social History   Socioeconomic History  . Marital status: Married    Spouse name: Not on file  . Number of children: Not on file  . Years of education: Not on file  . Highest education level: Not on file  Occupational History  . Occupation: Architect: UNEMPLOYED  Social Needs  . Financial resource strain: Not on file  . Food insecurity:    Worry: Not on file    Inability: Not on file  . Transportation needs:    Medical: Not on file    Non-medical: Not on file  Tobacco Use  . Smoking status: Former Smoker    Packs/day: 0.50    Years: 3.00    Pack years: 1.50    Types: Cigarettes    Last attempt to quit: 09/07/1983    Years since quitting: 34.7  . Smokeless tobacco: Never Used  . Tobacco comment: brief history   Substance and Sexual Activity  . Alcohol use: No    Comment: occ  . Drug use: No  . Sexual activity: Yes  Lifestyle  . Physical activity:    Days per week: Not on file    Minutes per session: Not on file  . Stress: Not on file  Relationships  . Social connections:    Talks on phone: Not on file    Gets together: Not on file    Attends religious service: Not on file    Active member of club or organization: Not on file    Attends meetings of clubs or organizations: Not on file    Relationship status: Not on file  . Intimate partner violence:    Fear of current or ex partner: Not on file    Emotionally abused: Not on file    Physically abused: Not on file    Forced sexual activity: Not on  file  Other Topics Concern  . Not on file  Social History Narrative  . Not on file     Review of Systems: General: negative for  chills, fever, night sweats or weight changes.  Cardiovascular: negative for chest pain, dyspnea on exertion, edema, orthopnea, palpitations, paroxysmal nocturnal dyspnea or shortness of breath Dermatological: negative for rash Respiratory: negative for cough or wheezing Urologic: negative for hematuria Abdominal: negative for nausea, vomiting, diarrhea, bright red blood per rectum, melena, or hematemesis Neurologic: negative for visual changes, syncope, or dizziness All other systems reviewed and are otherwise negative except as noted above.   Physical Exam:  Blood pressure 120/70, pulse 64, height 5\' 9"  (1.753 m), weight 215 lb 12.8 oz (97.9 kg).  General appearance: alert, cooperative, no distress and moderately obese Neck: no carotid bruit and no JVD Lungs: clear to auscultation bilaterally Heart: regular rate and rhythm, S1, S2 normal, no murmur, click, rub or gallop Extremities: extremities normal, atraumatic, no cyanosis or edema Pulses: 2+ and symmetric Skin: Skin color, texture, turgor normal. No rashes or lesions Neurologic: Grossly normal  EKG NSR, no ischemic abnormalities -- personally reviewed   ASSESSMENT AND PLAN:   1. Chest Pain: constant CP for nearly 10 hrs. Started last night about 4 hrs after eating a meal. Pt has been noncompliant with Nexium. No dyspnea and no exertional component, but has radiation to the left neck. EKG obtained today shows NSR with no ST abnormalities. He has had negative cardiac w/u in the past but several cardiac risk factors. We will check a STAT troponin today and will f/u on result. If negative, will schedule for coronary CTA to further evaluate given his multiple CFRs and prior negative stress test. Pt also instructed to improve compliance with Nexium.   2. H/o PVCs: past occurrence in the setting of heavy caffeine consumption with normal Echo and stress test. Monitor showed PVCs. improved with increase in BB and reduction of caffeine. EKG  today shows NSR. Continue BB.   3. Tobacco Use: smoking cessation advised. Pt also vapes and was warned about the negative effects as well as recent incidence of lung related injury with vaping.    Follow-Up after coronary CTA.   Brittainy Ladoris Gene, MHS [facility] 06/06/2018 1:03 PM

## 2018-06-06 NOTE — Patient Instructions (Signed)
Your physician recommends that you continue on your current medications as directed. Please refer to the Current Medication list given to you today. Your physician recommends that you return for lab work today (Troponin)  Your physician has requested that you have cardiac CT. Cardiac computed tomography (CT) is a painless test that uses an x-ray machine to take clear, detailed pictures of your heart. For further information please visit [email or web address]. Please follow instruction sheet as given.   Your physician recommends that you schedule a follow-up appointment 6-8 weeks with Lyda Jester, PA-C.  Please arrive at the [facility] main entrance of [facility] at xx:xx AM (30-45 minutes prior to test start time)  [facility] [place], [place] [address] [phone]  Proceed to the [facility] Radiology Department (First Floor).  Please follow these instructions carefully (unless otherwise directed):  Hold all erectile dysfunction medications at least 48 hours prior to test.  On the Night Before the Test: . Be sure to Drink plenty of water. . Do not consume any caffeinated/decaffeinated beverages or chocolate 12 hours prior to your test. . Do not take any antihistamines 12 hours prior to your test. .   If you take Metformin do not take 24 hours prior to test.   On the Day of the Test: . Drink plenty of water. Do not drink any water within one hour of the test. . Do not eat any food 4 hours prior to the test. . You may take your regular medications prior to the test. . IF NOT ON BETA BLOCKER-Take 50 mg of Lopressor (metoprolol) one hour before test . HOLD Furosemide morning of the test.  After the Test: . Drink plenty of water. . After receiving IV contrast, you may experience a mild flushed feeling. This is normal. . On occasion, you may experience a mild rash up to 24 hours after the test. This is not dangerous. If this occurs, you can  take Benadryl 25 mg and increase your fluid intake. . If you experience trouble breathing, this can be serious. If it is severe call 911 IMMEDIATELY. If it is mild, please call our office. . If you take any of these medications: Glipizide/Metformin, Avandament, Glucavance, please do not take 48 hours after completing test.

## 2018-06-07 DIAGNOSIS — R634 Abnormal weight loss: Secondary | ICD-10-CM | POA: Diagnosis not present

## 2018-06-07 DIAGNOSIS — Z23 Encounter for immunization: Secondary | ICD-10-CM | POA: Diagnosis not present

## 2018-06-07 DIAGNOSIS — M150 Primary generalized (osteo)arthritis: Secondary | ICD-10-CM | POA: Diagnosis not present

## 2018-06-07 DIAGNOSIS — D869 Sarcoidosis, unspecified: Secondary | ICD-10-CM | POA: Diagnosis not present

## 2018-06-07 DIAGNOSIS — Z79899 Other long term (current) drug therapy: Secondary | ICD-10-CM | POA: Diagnosis not present

## 2018-06-07 DIAGNOSIS — D8689 Sarcoidosis of other sites: Secondary | ICD-10-CM | POA: Diagnosis not present

## 2018-07-03 ENCOUNTER — Encounter (HOSPITAL_COMMUNITY): Payer: Self-pay

## 2018-07-03 ENCOUNTER — Ambulatory Visit (HOSPITAL_COMMUNITY)
Admission: RE | Admit: 2018-07-03 | Discharge: 2018-07-03 | Disposition: A | Discharge status: Home / Self Care | Payer: Medicare Other | Source: Ambulatory Visit | Attending: Cardiology | Admitting: Cardiology

## 2018-07-03 DIAGNOSIS — E785 Hyperlipidemia, unspecified: Secondary | ICD-10-CM

## 2018-07-03 DIAGNOSIS — H53002 Unspecified amblyopia, left eye: Secondary | ICD-10-CM | POA: Diagnosis not present

## 2018-07-03 DIAGNOSIS — R918 Other nonspecific abnormal finding of lung field: Secondary | ICD-10-CM | POA: Insufficient documentation

## 2018-07-03 DIAGNOSIS — H532 Diplopia: Secondary | ICD-10-CM | POA: Diagnosis not present

## 2018-07-03 DIAGNOSIS — R079 Chest pain, unspecified: Secondary | ICD-10-CM | POA: Insufficient documentation

## 2018-07-03 DIAGNOSIS — H0259 Other disorders affecting eyelid function: Secondary | ICD-10-CM | POA: Diagnosis not present

## 2018-07-03 DIAGNOSIS — H04123 Dry eye syndrome of bilateral lacrimal glands: Secondary | ICD-10-CM | POA: Diagnosis not present

## 2018-07-03 DIAGNOSIS — H5789 Other specified disorders of eye and adnexa: Secondary | ICD-10-CM | POA: Diagnosis not present

## 2018-07-03 DIAGNOSIS — D869 Sarcoidosis, unspecified: Secondary | ICD-10-CM | POA: Diagnosis not present

## 2018-07-03 DIAGNOSIS — H2513 Age-related nuclear cataract, bilateral: Secondary | ICD-10-CM | POA: Diagnosis not present

## 2018-07-03 MED ORDER — METOPROLOL TARTRATE 5 MG/5ML IV SOLN
INTRAVENOUS | Status: AC
  Filled 2018-07-03: qty 5

## 2018-07-03 MED ORDER — IOPAMIDOL (ISOVUE-370) INJECTION 76%
100.0000 mL | Freq: Once | INTRAVENOUS | Status: AC | PRN
  Administered 2018-07-03: 90 mL via INTRAVENOUS

## 2018-07-03 MED ORDER — NITROGLYCERIN 0.4 MG SL SUBL
0.8000 mg | SUBLINGUAL_TABLET | SUBLINGUAL | Status: DC | PRN
  Filled 2018-07-03: qty 25

## 2018-07-03 MED ORDER — NITROGLYCERIN 0.4 MG SL SUBL
SUBLINGUAL_TABLET | SUBLINGUAL | Status: AC
  Administered 2018-07-03: 0.8 mg
  Filled 2018-07-03: qty 2

## 2018-07-05 ENCOUNTER — Telehealth: Payer: Self-pay

## 2018-07-05 NOTE — Telephone Encounter (Signed)
-----   Message from Consuelo Pandy, [place] sent at 07/04/2018  1:42 PM EDT ----- Let pt know that his coronary CT showed no signs of coronary artery disease. No calcium detected. This is good! But he should still try to keep BP under good control, have PCP monitor lipids, monitor for DM, he should quit smoking and try to live a healthy lifestyle to try to reduce the risk of heart disease in the future.

## 2018-07-05 NOTE — Telephone Encounter (Signed)
Notes recorded by Frederik Schmidt, RN on 07/05/2018 at 1:22 PM EDT Tried reaching patient, but mailbox is full. ------

## 2018-07-07 ENCOUNTER — Telehealth: Payer: Self-pay

## 2018-07-07 NOTE — Telephone Encounter (Signed)
Notes recorded by Frederik Schmidt, RN on 07/07/2018 at 10:52 AM EDT The patient has been notified of the result and verbalized understanding. All questions (if any) were answered. Frederik Schmidt, RN 07/07/2018 10:52 AM

## 2018-07-07 NOTE — Telephone Encounter (Signed)
-----   Message from Consuelo Pandy, [place] sent at 07/04/2018  1:42 PM EDT ----- Let pt know that his coronary CT showed no signs of coronary artery disease. No calcium detected. This is good! But he should still try to keep BP under good control, have PCP monitor lipids, monitor for DM, he should quit smoking and try to live a healthy lifestyle to try to reduce the risk of heart disease in the future.

## 2018-07-27 ENCOUNTER — Ambulatory Visit (INDEPENDENT_AMBULATORY_CARE_PROVIDER_SITE_OTHER): Payer: Medicare Other | Admitting: Cardiology

## 2018-07-27 ENCOUNTER — Encounter: Payer: Self-pay | Admitting: Cardiology

## 2018-07-27 VITALS — BP 104/78 | HR 74 | Ht 69.0 in | Wt 205.4 lb

## 2018-07-27 DIAGNOSIS — R0789 Other chest pain: Secondary | ICD-10-CM | POA: Diagnosis not present

## 2018-07-27 NOTE — Patient Instructions (Signed)
Medication Instructions:  NONE If you need a refill on your cardiac medications before your next appointment, please call your pharmacy.   Lab work: NONE If you have labs (blood work) drawn today and your tests are completely normal, you will receive your results only by: Marland Kitchen MyChart Message (if you have MyChart) OR . A paper copy in the mail If you have any lab test that is abnormal or we need to change your treatment, we will call you to review the results.  Testing/Procedures: NONE  Follow-Up: As needed   At [facility], you and your health needs are our priority.  As part of our continuing mission to provide you with exceptional heart care, we have created designated Provider Care Teams.  These Care Teams include your primary Cardiologist (physician) and Advanced Practice Providers (APPs -  Physician Assistants and Nurse Practitioners) who all work together to provide you with the care you need, when you need it. .   Any Other Special Instructions Will Be Listed Below (If Applicable).

## 2018-07-27 NOTE — Progress Notes (Signed)
07/27/2018 John Manns Jr.   08/28/1966  [id number]  Primary Physician Marletta Lor, MD Primary Cardiologist: [name]   Reason for Visit/CC: f/u for CP   HPI:   52 y/o AAM followed by [name], but not seen in over 2 years.He has been followed in the past at [facility] for pulmonary and neurosarcoidosis. He has a hx of idiopathic hypertrophic cardiomyopathy, depression, HLD, GERD, HTN, and glucose intolerance. He was seen for chest pain by cardiology in 12/2011. Myoview 12/29/11: No ischemia, EF 66%. He was admitted 3/28-4/1 with chest pain. He was again seen by cardiology. Symptoms were felt to be atypical for ischemia. Cardiac markers were negative. D-dimer was negative. Echocardiogram 12/01/12: Mild focal basal hypertrophy the septum, EF 83-38%, grade 1 diastolic dysfunction, mild LAE. Patient had palpitations. He had one ventricular couplet on telemetry. In 07/2013, he ws revaluated for CP and ultimately underwent a LHC which showed normal coronaries. EF was 50-55%. His CP was noncardiac. He was lost to f/u for a period of time and was being followed some at [facility] in the past with cardiac MRIs as there was concern for possible sarcoid cardiac involvement. However he ultimately had a spinal stimulator implanted, thus he was unable to get f/u MRIs. As far as he knows, no diagnosis of cardiac sarcoid was ever made.   I evaluated him in June 2016 for palpitations and CP. W/u conducted, including NST, 2D echo and 30 day monitor. NST was low risk. No ischemia. 2D echo showed normal LVEF of 60-65%. G1DD. Normal LV size with moderate focal basal septal hypertrophy.No LV outflow tract gradient. Wall motion was normal; there were no regional wall motion abnormalities. There appeared to be chordal but not valvular SAM. There was no significant mitral regurgitation. His cardiac monitor findings were interpreted by [name]. This showed mainly NSR with PVCs. We  instructed him to increase his atenolol to 50 mg daily. In further discussion, pt had also noted that he had been drinking energy drinks daily. He was drinking Monster [employer] 20 oz drinks.No ETOH. Advised to lay off caffeine and f/u with [name] in 6 months. Pt never followed up.  He presented back to clinic on 06/06/18. This was his fist OV in 2 years. He endorsed new left sided chest pain, radiation to left axilla. Felt like pressure but no exertional symptoms. He noted some radiation to his left neck. Had been present off and on over an 8 hr period (had started the night before). His EKG during active CP was nonischemic. We ordered a STAT troponin in the office that was negative. He had also noted poor med compliance at home. He was not taking his reflux meds (Nexium). He was instructed to improve compliance. We arranged a coronary CTA to r/o CAD. Study was done and negative. No CAD. Calcium score was 0.   He is back today for f/u. He is taking his Nexium. Less CP. Still has some occasional neck and arm pain. ? Cervical DJD w/ radiculopathy. He is planning to f/u with his neurosurgeon. He has h/o spinal surgery.    Cardiac Studies  Coronary CTA  07/03/18  FINDINGS: Non-cardiac: See separate report from [facility].  Pulmonary veins drained normally to the left atrium.  Calcium Score: 0 Agatston units.  Coronary Arteries: Right dominant with no anomalies  LM: No plaque or stenosis.  LAD system:  No plaque or stenosis.  There is a large D1.  Circumflex system: Relatively  small LCx.  No plaque or stenosis.  RCA system:  No plaque or stenosis.  IMPRESSION: 1. Coronary artery calcium score 0 Agatston units, suggesting low risk for future cardiac events.  2.  No significant coronary disease noted.  [name]   Current Meds  Medication Sig  . albuterol (PROVENTIL HFA;VENTOLIN HFA) 108 (90 BASE) MCG/ACT inhaler Inhale 2 puffs into the lungs 2 (two) times  daily as needed for wheezing or shortness of breath.   Marland Kitchen amitriptyline (ELAVIL) 25 MG tablet TAKE 1 TABLET (25 MG TOTAL) BY MOUTH AT BEDTIME.  Marland Kitchen atenolol (TENORMIN) 50 MG tablet Take 1 tablet (50 mg total) by mouth daily.  Marland Kitchen atorvastatin (LIPITOR) 40 MG tablet Take 1 tablet (40 mg total) by mouth daily.  Marland Kitchen azaTHIOprine (IMURAN) 50 MG tablet Take 250 mg by mouth daily.   Marland Kitchen buPROPion (WELLBUTRIN XL) 300 MG 24 hr tablet TAKE ONE TABLET BY MOUTH EVERY MORNING  . cyanocobalamin (,VITAMIN B-12,) 1000 MCG/ML injection INJECT ONE MILLILITER INJECTION  INTO THE MUSCLE EVERY 30 DAYS, ON OR AFTER THE FIRST OF EACH MONTH AS DIRECTED  . cyclobenzaprine (FLEXERIL) 10 MG tablet Take 1 tablet by mouth daily as needed.  . cycloSPORINE (RESTASIS) 0.05 % ophthalmic emulsion Place 2 drops into both eyes 2 (two) times daily.   Marland Kitchen dicyclomine (BENTYL) 10 MG capsule TAKE 1 CAPSULE BY MOUTH 4 TIMES DAILY  . DULoxetine (CYMBALTA) 30 MG capsule Take 1 capsule (30 mg total) by mouth daily.  . DULoxetine (CYMBALTA) 60 MG capsule TAKE ONE CAPSULE BY MOUTH DAILY  . erythromycin ophthalmic ointment Place 1 application into both eyes at bedtime.   . gabapentin (NEURONTIN) 300 MG capsule TAKE ONE CAPSULE BY MOUTH FOUR TIMES A DAY  . guaiFENesin (MUCINEX) 600 MG 12 hr tablet Take 600 mg by mouth 2 (two) times daily.   Marland Kitchen lidocaine (XYLOCAINE) 5 % ointment Apply 1 application topically daily as needed for moderate pain.   Marland Kitchen NEXIUM 40 MG capsule TAKE ONE CAPSULE BY MOUTH DAILY  . oxyCODONE-acetaminophen (PERCOCET) 10-325 MG tablet Take 1 tablet by mouth See admin instructions. Take 1 tablet by mouth every morning and 1 tablet before 6pm  . Polyethyl Glycol-Propyl Glycol (SYSTANE PRESERVATIVE FREE OP) Place 1 drop into both eyes 2 (two) times daily.  Marland Kitchen triamcinolone (NASACORT AQ) 55 MCG/ACT AERO nasal inhaler Place 1 spray into both nostrils daily as needed (dry mouth).   . triamcinolone ointment (KENALOG) 0.1 % Apply 1 application  topically daily.    Current Facility-Administered Medications for the 07/27/18 encounter (Office Visit) with Consuelo Pandy, PA-C  Medication  . 0.9 %  sodium chloride infusion   Allergies  Allergen Reactions  . Adhesive [Tape] Other (See Comments)    Skin irritation  . Pollen Extract Other (See Comments)    Stuffy nose   Past Medical History:  Diagnosis Date  . Allergy   . Anginal pain ([place])    s/p cardiac cath 07/19/13 showing NL coronaries, EF 50-55%  . Anxiety   . Asthma    as child  . B12 DEFICIENCY 03/06/2007  . Cancer [facility])    prostate ca  . CARDIOMYOPATHY, IDIOPATHIC HYPERTROPHIC 03/06/2007  . Clotting disorder (HCC)    knee   . DEPRESSION 03/06/2007  . DVT (deep venous thrombosis) ([place])   . Dysrhythmia    sees [name] for "palpitations"  . GERD 06/01/2007  . History of kidney stones   . HYPERCHOLESTEROLEMIA 03/06/2007  . HYPERTENSION 03/06/2007  [name]  . IMPAIRED GLUCOSE TOLERANCE 11/08/2008  . NEPHROLITHIASIS, HX OF 04/07/2010  . 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 OSTEOARTHRITIS 06/01/2007  . Palpitations 10/23/2007  . Peripheral vascular disease (HCC) 03   dvt  . Pneumonia    hx of  . Primary idiopathic hypertrophic cardiomyopathy ([place])   . Prostate cancer ([place])   . Shortness of breath    infreq  . SLEEP APNEA 03/06/2007   uses vpap, last sleep study 2011, Dr. Alanson Puls, at [facility]  . Sleep apnea    wears cpap  . SYNDROME, CHRONIC PAIN 03/06/2007   Family History  Problem Relation Age of Onset  . Heart disease Mother   . Kidney disease Neg Hx        family hx   . Cancer Neg Hx        family hx prostate  . Colon cancer Neg Hx   . Colon polyps Neg Hx   . Rectal cancer Neg Hx   . Stomach cancer Neg Hx    Past Surgical History:  Procedure Laterality Date  . CARDIAC CATHETERIZATION    . decompression and fusion     cervicothoracic  . EYE SURGERY Bilateral    blepharoplasty  . LEFT HEART CATHETERIZATION WITH CORONARY ANGIOGRAM N/A  07/19/2013   Procedure: LEFT HEART CATHETERIZATION WITH CORONARY ANGIOGRAM;  Surgeon: Jolaine Artist, MD;  Location: [facility] CATH LAB;  Service: Cardiovascular;  Laterality: N/A;  . LEG SKIN LESION  BIOPSY / EXCISION     left leg  . LUMBAR FUSION    . PARTIAL NEPHRECTOMY Left    bx  . PENILE PROSTHESIS IMPLANT    . PROSTATE SURGERY     robotic prostatectomy  . SPINAL CORD STIMULATOR INSERTION N/A 04/11/2014   Procedure: LUMBAR SPINAL CORD STIMULATOR INSERTION;  Surgeon: Newman Pies, MD;  Location: [facility]URO ORS;  Service: Neurosurgery;  Laterality: N/A;  . TONSILLECTOMY    . UPPER GASTROINTESTINAL ENDOSCOPY  2009   Social History   Socioeconomic History  . Marital status: Divorced    Spouse name: Not on file  . Number of children: Not on file  . Years of education: Not on file  . Highest education level: Not on file  Occupational History  . Occupation: Architect: UNEMPLOYED  Social Needs  . Financial resource strain: Not on file  . Food insecurity:    Worry: Not on file    Inability: Not on file  . Transportation needs:    Medical: Not on file    Non-medical: Not on file  Tobacco Use  . Smoking status: Former Smoker    Packs/day: 0.50    Years: 3.00    Pack years: 1.50    Types: Cigarettes    Last attempt to quit: 09/07/1983    Years since quitting: 34.9  . Smokeless tobacco: Never Used  . Tobacco comment: brief history   Substance and Sexual Activity  . Alcohol use: No    Comment: occ  . Drug use: No  . Sexual activity: Yes  Lifestyle  . Physical activity:    Days per week: Not on file    Minutes per session: Not on file  . Stress: Not on file  Relationships  . Social connections:    Talks on phone: Not on file    Gets together: Not on file    Attends religious service: Not on file    Active member of club or organization: Not on file  Attends meetings of clubs or organizations: Not on file    Relationship status: Not on file  . Intimate  partner violence:    Fear of current or ex partner: Not on file    Emotionally abused: Not on file    Physically abused: Not on file    Forced sexual activity: Not on file  Other Topics Concern  . Not on file  Social History Narrative  . Not on file     Review of Systems: General: negative for chills, fever, night sweats or weight changes.  Cardiovascular: negative for chest pain, dyspnea on exertion, edema, orthopnea, palpitations, paroxysmal nocturnal dyspnea or shortness of breath Dermatological: negative for rash Respiratory: negative for cough or wheezing Urologic: negative for hematuria Abdominal: negative for nausea, vomiting, diarrhea, bright red blood per rectum, melena, or hematemesis Neurologic: negative for visual changes, syncope, or dizziness All other systems reviewed and are otherwise negative except as noted above.   Physical Exam:  Blood pressure 104/78, pulse 74, height 5\' 9"  (1.753 m), weight 205 lb 6.4 oz (93.2 kg), SpO2 98 %.  General appearance: alert, cooperative and no distress Neck: no carotid bruit and no JVD Lungs: clear to auscultation bilaterally Heart: regular rate and rhythm, S1, S2 normal, no murmur, click, rub or gallop Extremities: extremities normal, atraumatic, no cyanosis or edema Pulses: 2+ and symmetric Skin: Skin color, texture, turgor normal. No rashes or lesions Neurologic: Grossly normal  EKG not performed -- personally reviewed   ASSESSMENT AND PLAN:   1. Non Cardiac CP: coronary CTA showed no CAD. Normal coronaries. Calcium score is 0. Suspect GI component and possibly every cervical DJD w/ radiculopathy. His anterior CP has improved with Nexium but still has some left neck and left arm discomfort. He plans to f/u with his neurosurgeon. He has a h/o spine surgery. No further cardiac w/u at this time. We discussed prevention: including continued control of BP and and lipids. His lipid panel in March showed LDL at 73 mg/dL.     Follow-Up PRN.   Brittainy Ladoris Gene, MHS [facility] 07/27/2018 8:34 AM

## 2018-08-04 ENCOUNTER — Emergency Department (HOSPITAL_COMMUNITY): Payer: Medicare Other

## 2018-08-04 ENCOUNTER — Encounter (HOSPITAL_COMMUNITY): Payer: Self-pay | Admitting: Emergency Medicine

## 2018-08-04 ENCOUNTER — Emergency Department (HOSPITAL_COMMUNITY)
Admission: EM | Admit: 2018-08-04 | Discharge: 2018-08-04 | Disposition: A | Discharge status: Home / Self Care | Payer: Medicare Other | Attending: Emergency Medicine | Admitting: Emergency Medicine

## 2018-08-04 DIAGNOSIS — Z79899 Other long term (current) drug therapy: Secondary | ICD-10-CM | POA: Insufficient documentation

## 2018-08-04 DIAGNOSIS — I10 Essential (primary) hypertension: Secondary | ICD-10-CM | POA: Insufficient documentation

## 2018-08-04 DIAGNOSIS — S63602A Unspecified sprain of left thumb, initial encounter: Secondary | ICD-10-CM | POA: Diagnosis not present

## 2018-08-04 DIAGNOSIS — S63682A Other sprain of left thumb, initial encounter: Secondary | ICD-10-CM | POA: Insufficient documentation

## 2018-08-04 DIAGNOSIS — Y939 Activity, unspecified: Secondary | ICD-10-CM | POA: Insufficient documentation

## 2018-08-04 DIAGNOSIS — X501XXA Overexertion from prolonged static or awkward postures, initial encounter: Secondary | ICD-10-CM | POA: Insufficient documentation

## 2018-08-04 DIAGNOSIS — Y929 Unspecified place or not applicable: Secondary | ICD-10-CM | POA: Diagnosis not present

## 2018-08-04 DIAGNOSIS — J45909 Unspecified asthma, uncomplicated: Secondary | ICD-10-CM | POA: Diagnosis not present

## 2018-08-04 DIAGNOSIS — Y999 Unspecified external cause status: Secondary | ICD-10-CM | POA: Diagnosis not present

## 2018-08-04 DIAGNOSIS — Z87891 Personal history of nicotine dependence: Secondary | ICD-10-CM | POA: Insufficient documentation

## 2018-08-04 DIAGNOSIS — S6992XA Unspecified injury of left wrist, hand and finger(s), initial encounter: Secondary | ICD-10-CM | POA: Diagnosis not present

## 2018-08-04 MED ORDER — IBUPROFEN 800 MG PO TABS
800.0000 mg | ORAL_TABLET | Freq: Once | ORAL | Status: AC
  Administered 2018-08-04: 800 mg via ORAL
  Filled 2018-08-04: qty 1

## 2018-08-04 NOTE — ED Triage Notes (Signed)
Pt states he left thumb was "pressed all the way back".  Pt states thumb is numb.

## 2018-08-04 NOTE — ED Provider Notes (Signed)
TIME SEEN: 2:57 AM  CHIEF COMPLAINT: Left thumb injury  HPI: Patient is a 52 year old male with history of cardiomyopathy, DVT, peripheral vascular disease who presents to the emergency department with a left thumb injury.  He is right-hand dominant.  States that he was pushing a box today when his left thumb went backwards.  Having pain with movement of the thumb and pain in the wrist as well.  No other injury.  Feels like there is some tingling at the tip of the thumb.  Has had some swelling.  ROS: See HPI Constitutional: no fever  Eyes: no drainage  ENT: no runny nose   Cardiovascular:  no chest pain  Resp: no SOB  GI: no vomiting GU: no dysuria Integumentary: no rash  Allergy: no hives  Musculoskeletal: no leg swelling  Neurological: no slurred speech ROS otherwise negative  PAST MEDICAL HISTORY/PAST SURGICAL HISTORY:  Past Medical History:  Diagnosis Date  . Allergy   . Anginal pain ([place])    s/p cardiac cath 07/19/13 showing NL coronaries, EF 50-55%  . Anxiety   . Asthma    as child  . B12 DEFICIENCY 03/06/2007  . Cancer [facility])    prostate ca  . CARDIOMYOPATHY, IDIOPATHIC HYPERTROPHIC 03/06/2007  . Clotting disorder (HCC)    knee   . DEPRESSION 03/06/2007  . DVT (deep venous thrombosis) ([place])   . Dysrhythmia    sees [name] for "palpitations"  . GERD 06/01/2007  . History of kidney stones   . HYPERCHOLESTEROLEMIA 03/06/2007  . HYPERTENSION 03/06/2007   [name]  . IMPAIRED GLUCOSE TOLERANCE 11/08/2008  . NEPHROLITHIASIS, HX OF 04/07/2010  . 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 OSTEOARTHRITIS 06/01/2007  . Palpitations 10/23/2007  . Peripheral vascular disease (HCC) 03   dvt  . Pneumonia    hx of  . Primary idiopathic hypertrophic cardiomyopathy ([place])   . Prostate cancer ([place])   . Shortness of breath    infreq  . SLEEP APNEA 03/06/2007   uses vpap, last sleep study 2011, Dr. Alanson Puls, at [facility]  . Sleep apnea    wears cpap  . SYNDROME, CHRONIC PAIN 03/06/2007     MEDICATIONS:  Prior to Admission medications   Medication Sig Start Date End Date Taking? Authorizing Provider  albuterol (PROVENTIL HFA;VENTOLIN HFA) 108 (90 BASE) MCG/ACT inhaler Inhale 2 puffs into the lungs 2 (two) times daily as needed for wheezing or shortness of breath.     [provider]  amitriptyline (ELAVIL) 25 MG tablet TAKE 1 TABLET (25 MG TOTAL) BY MOUTH AT BEDTIME. 12/29/15   Marletta Lor, MD  atenolol (TENORMIN) 50 MG tablet Take 1 tablet (50 mg total) by mouth daily. 06/06/18   Lyda Jester M, PA-C  atorvastatin (LIPITOR) 40 MG tablet Take 1 tablet (40 mg total) by mouth daily. 06/06/18   Lyda Jester M, PA-C  azaTHIOprine (IMURAN) 50 MG tablet Take 250 mg by mouth daily.     [provider]  buPROPion (WELLBUTRIN XL) 300 MG 24 hr tablet TAKE ONE TABLET BY MOUTH EVERY MORNING 06/07/16   Marletta Lor, MD  cyanocobalamin (,VITAMIN B-12,) 1000 MCG/ML injection INJECT ONE MILLILITER INJECTION  INTO THE MUSCLE EVERY 30 DAYS, ON OR AFTER THE FIRST OF EACH MONTH AS DIRECTED 01/04/18   Marletta Lor, MD  cyclobenzaprine (FLEXERIL) 10 MG tablet Take 1 tablet by mouth daily as needed. 06/06/13   [provider]  cycloSPORINE (RESTASIS) 0.05 % ophthalmic emulsion Place 2 drops into both eyes  2 (two) times daily.     [provider]  dicyclomine (BENTYL) 10 MG capsule TAKE 1 CAPSULE BY MOUTH 4 TIMES DAILY 07/28/15   Marletta Lor, MD  DULoxetine (CYMBALTA) 30 MG capsule Take 1 capsule (30 mg total) by mouth daily. 11/24/17   Marletta Lor, MD  DULoxetine (CYMBALTA) 60 MG capsule TAKE ONE CAPSULE BY MOUTH DAILY 11/28/17   Marletta Lor, MD  erythromycin ophthalmic ointment Place 1 application into both eyes at bedtime.  04/15/14   [provider]  gabapentin (NEURONTIN) 300 MG capsule TAKE ONE CAPSULE BY MOUTH FOUR TIMES A DAY 12/07/17   Marletta Lor, MD  guaiFENesin (MUCINEX) 600 MG 12 hr  tablet Take 600 mg by mouth 2 (two) times daily.     [provider]  lidocaine (XYLOCAINE) 5 % ointment Apply 1 application topically daily as needed for moderate pain.     [provider]  NEXIUM 40 MG capsule TAKE ONE CAPSULE BY MOUTH DAILY 10/09/14   Marletta Lor, MD  oxyCODONE-acetaminophen (PERCOCET) 10-325 MG tablet Take 1 tablet by mouth See admin instructions. Take 1 tablet by mouth every morning and 1 tablet before 6pm    [provider]  Polyethyl Glycol-Propyl Glycol (SYSTANE PRESERVATIVE FREE OP) Place 1 drop into both eyes 2 (two) times daily.    [provider]  triamcinolone (NASACORT AQ) 55 MCG/ACT AERO nasal inhaler Place 1 spray into both nostrils daily as needed (dry mouth).     [provider]  triamcinolone ointment (KENALOG) 0.1 % Apply 1 application topically daily.     [provider]    ALLERGIES:  Allergies  Allergen Reactions  . Adhesive [Tape] Other (See Comments)    Skin irritation  . Pollen Extract Other (See Comments)    Stuffy nose    SOCIAL HISTORY:  Social History   Tobacco Use  . Smoking status: Former Smoker    Packs/day: 0.50    Years: 3.00    Pack years: 1.50    Types: Cigarettes    Last attempt to quit: 09/07/1983    Years since quitting: 34.9  . Smokeless tobacco: Never Used  . Tobacco comment: brief history   Substance Use Topics  . Alcohol use: No    Comment: occ    FAMILY HISTORY: Family History  Problem Relation Age of Onset  . Heart disease Mother   . Kidney disease Neg Hx        family hx   . Cancer Neg Hx        family hx prostate  . Colon cancer Neg Hx   . Colon polyps Neg Hx   . Rectal cancer Neg Hx   . Stomach cancer Neg Hx     EXAM: BP (!) 135/91   Pulse 78   Temp 98 F (36.7 C) (Oral)   Resp 18   Ht 5\' 9"  (1.753 m)   Wt 93 kg   SpO2 100%   BMI 30.27 kg/m  CONSTITUTIONAL: Alert and oriented and responds appropriately to questions. Well-appearing;  well-nourished HEAD: Normocephalic EYES: Conjunctivae clear, pupils appear equal, EOMI ENT: normal nose; moist mucous membranes NECK: Supple, no meningismus, no nuchal rigidity, no LAD  CARD: RRR; S1 and S2 appreciated; no murmurs, no clicks, no rubs, no gallops RESP: Normal chest excursion without splinting or tachypnea; breath sounds clear and equal bilaterally; no wheezes, no rhonchi, no rales, no hypoxia or respiratory distress, speaking full sentences ABD/GI: Normal bowel  sounds; non-distended; soft, non-tender, no rebound, no guarding, no peritoneal signs, no hepatosplenomegaly BACK:  The back appears normal and is non-tender to palpation, there is no CVA tenderness EXT: Tender to palpation over the MCP of the left thumb and over the left dorsal radial wrist.  2+ left radial pulse on examination.  He has full range of motion of the left thumb but does so cautiously secondary to pain.  Has some ecchymosis and swelling noted to the proximal left thumb.  Normal capillary refill.  Reports normal sensation in the left hand.  No tenderness over the left forearm, elbow. SKIN: Normal color for age and race; warm; no rash NEURO: Moves all extremities equally PSYCH: The patient's mood and manner are appropriate. Grooming and personal hygiene are appropriate.  MEDICAL DECISION MAKING: Patient here with left thumb and wrist injury.  Will obtain x-rays.  Will give ibuprofen for pain.  Neurovascularly intact distally.  ED PROGRESS: Show no fracture.  Will place him in a thumb spica wrist splint for comfort.  Recommended alternating Tylenol and Motrin for pain.  Recommended rest, elevation, ice.  Patient verbalized understanding and is comfortable with this plan.  He has a PCP for follow-up as needed.  Suspect sprain.   At this time, I do not feel there is any life-threatening condition present. I have reviewed and discussed all results (EKG, imaging, lab, urine as appropriate) and exam findings with  patient/family. I have reviewed nursing notes and appropriate previous records.  I feel the patient is safe to be discharged home without further emergent workup and can continue workup as an outpatient as needed. Discussed usual and customary return precautions. Patient/family verbalize understanding and are comfortable with this plan.  Outpatient follow-up has been provided if needed. All questions have been answered.     Seger Jani, Delice Bison, DO 08/04/18 [phone]

## 2018-08-04 NOTE — Discharge Instructions (Signed)
You may alternate Tylenol 1000 mg every 6 hours as needed for pain and Ibuprofen 800 mg every 8 hours as needed for pain.  Please take Ibuprofen with food. ° °

## 2018-08-14 DIAGNOSIS — R200 Anesthesia of skin: Secondary | ICD-10-CM | POA: Diagnosis not present

## 2018-08-14 DIAGNOSIS — D869 Sarcoidosis, unspecified: Secondary | ICD-10-CM | POA: Diagnosis not present

## 2018-08-14 DIAGNOSIS — G4733 Obstructive sleep apnea (adult) (pediatric): Secondary | ICD-10-CM | POA: Diagnosis not present

## 2018-08-14 DIAGNOSIS — G43009 Migraine without aura, not intractable, without status migrainosus: Secondary | ICD-10-CM | POA: Diagnosis not present

## 2018-08-21 DIAGNOSIS — R51 Headache: Secondary | ICD-10-CM | POA: Diagnosis not present

## 2018-08-21 DIAGNOSIS — D8689 Sarcoidosis of other sites: Secondary | ICD-10-CM | POA: Diagnosis not present

## 2018-08-21 DIAGNOSIS — R200 Anesthesia of skin: Secondary | ICD-10-CM | POA: Diagnosis not present

## 2018-08-21 DIAGNOSIS — D869 Sarcoidosis, unspecified: Secondary | ICD-10-CM | POA: Diagnosis not present

## 2018-08-31 ENCOUNTER — Ambulatory Visit: Payer: Medicare Other

## 2018-09-08 ENCOUNTER — Ambulatory Visit (INDEPENDENT_AMBULATORY_CARE_PROVIDER_SITE_OTHER): Payer: Medicare Other | Admitting: Family Medicine

## 2018-09-08 ENCOUNTER — Encounter: Payer: Self-pay | Admitting: Family Medicine

## 2018-09-08 ENCOUNTER — Ambulatory Visit: Payer: Self-pay | Admitting: *Deleted

## 2018-09-08 VITALS — BP 126/82 | HR 77 | Temp 98.0°F | Ht 69.0 in | Wt 211.2 lb

## 2018-09-08 DIAGNOSIS — K644 Residual hemorrhoidal skin tags: Secondary | ICD-10-CM

## 2018-09-08 DIAGNOSIS — Z0184 Encounter for antibody response examination: Secondary | ICD-10-CM | POA: Diagnosis not present

## 2018-09-08 MED ORDER — HYDROCORTISONE 2.5 % RE CREA: 1.0000 "application " | TOPICAL_CREAM | Freq: Three times a day (TID) | RECTAL | 1 refills | Status: DC

## 2018-09-08 NOTE — Telephone Encounter (Signed)
Pt called with complaints of hemorrhoids that have been bleeding since 09/06/2018; he also says that they have been protruding for about 2 week; the pt says that he normally sees bleeding when he wipes but last night he bled through his pajama pants; he has tried OTC medications with no relief in symptoms; recommendations made per nurse triage protocol; the pt is normally seen at [facility] but there is no availability in that office today; confirmed with Apolonio Schneiders; pt agrees to  Be seen in another office; pt offered and accepted appointment with Dr Nolon Lennert, [facility], 09/08/2018 at 1000; he verbalized understanding; the pt also was formally seen by Dr Inda Merlin at [facility]; he scheduled a transfer of care appointment with Dr Jerilee Hoh 10/01/2018 at 0730; will route to both offices for notification of these upcoming appointments.   Reason for Disposition . MODERATE-SEVERE rectal pain (i.e., interferes with school, work, or sleep)  Answer Assessment - Initial Assessment Questions 1. SYMPTOM:  "What's the main symptom you're concerned about?" (e.g., pain, itching, swelling, rash)     Bleeding and protruding hemorroids 2. ONSET: "When did the   start?"     09/06/2018 3. RECTAL PAIN: "Do you have any pain around your rectum?" "How bad is the pain?"  (Scale 1-10; or mild, moderate, severe)  - MILD (1-3): doesn't interfere with normal activities   - MODERATE (4-7): interferes with normal activities or awakens from sleep, limping   - SEVERE (8-10): excruciating pain, unable to have a bowel movement      Mild to moderate 4. RECTAL ITCHING: "Do you have any itching in this area?" "How bad is the itching?"  (Scale 1-10; or mild, moderate, severe)  - MILD - doesn't interfere with normal activities   - MODERATE - SEVERE: interferes with normal activities or awakens from sleep     no 5. CONSTIPATION: "Do you have constipation?" If so, "How bad is it?"     no 6. CAUSE: "What do you think is causing  the anus symptoms?"     hemorrhoids 7. OTHER SYMPTOMS: "Do you have any other symptoms?"  (e.g., rectal bleeding, abdominal pain, vomiting, fever)     nausea on 09/07/2018; protruding hemorrhoids for 2 weeks  8. PREGNANCY: "Is there any chance you are pregnant?" "When was your last menstrual period?"     n/a  Protocols used: RECTAL [facility]

## 2018-09-08 NOTE — Patient Instructions (Addendum)
Drink plenty of water - goal of 48-64oz of water per day Use Tucs pads/wipes Sitz baths - sit in warm water (no soap, etc) for 15-20 min a few times per day as needed Use  Use Rx cream 2-3x/day Referral placed to colorectal surgery  Hemorrhoids Hemorrhoids are swollen veins in and around the rectum or anus. There are two types of hemorrhoids:  Internal hemorrhoids. These occur in the veins that are just inside the rectum. They may poke through to the outside and become irritated and painful.  External hemorrhoids. These occur in the veins that are outside the anus and can be felt as a painful swelling or hard lump near the anus. Most hemorrhoids do not cause serious problems, and they can be managed with home treatments such as diet and lifestyle changes. If home treatments do not help the symptoms, procedures can be done to shrink or remove the hemorrhoids. What are the causes? This condition is caused by increased pressure in the anal area. This pressure may result from various things, including:  Constipation.  Straining to have a bowel movement.  Diarrhea.  Pregnancy.  Obesity.  Sitting for long periods of time.  Heavy lifting or other activity that causes you to strain.  Anal sex.  Riding a bike for a long period of time. What are the signs or symptoms? Symptoms of this condition include:  Pain.  Anal itching or irritation.  Rectal bleeding.  Leakage of stool (feces).  Anal swelling.  One or more lumps around the anus. How is this diagnosed? This condition can often be diagnosed through a visual exam. Other exams or tests may also be done, such as:  An exam that involves feeling the rectal area with a gloved hand (digital rectal exam).  An exam of the anal canal that is done using a small tube (anoscope).  A blood test, if you have lost a significant amount of blood.  A test to look inside the colon using a flexible tube with a camera on the end  (sigmoidoscopy or colonoscopy). How is this treated? This condition can usually be treated at home. However, various procedures may be done if dietary changes, lifestyle changes, and other home treatments do not help your symptoms. These procedures can help make the hemorrhoids smaller or remove them completely. Some of these procedures involve surgery, and others do not. Common procedures include:  Rubber band ligation. Rubber bands are placed at the base of the hemorrhoids to cut off their blood supply.  Sclerotherapy. Medicine is injected into the hemorrhoids to shrink them.  Infrared coagulation. A type of light energy is used to get rid of the hemorrhoids.  Hemorrhoidectomy surgery. The hemorrhoids are surgically removed, and the veins that supply them are tied off.  Stapled hemorrhoidopexy surgery. The surgeon staples the base of the hemorrhoid to the rectal wall. Follow these instructions at home: Eating and drinking   Eat foods that have a lot of fiber in them, such as whole grains, beans, nuts, fruits, and vegetables.  Ask your health care provider about taking products that have added fiber (fiber supplements).  Reduce the amount of fat in your diet. You can do this by eating low-fat dairy products, eating less red meat, and avoiding processed foods.  Drink enough fluid to keep your urine pale yellow. Managing pain and swelling   Take warm sitz baths for 20 minutes, 3-4 times a day to ease pain and discomfort. You may do this in a bathtub or  using a portable sitz bath that fits over the toilet.  If directed, apply ice to the affected area. Using ice packs between sitz baths may be helpful. ? Put ice in a plastic bag. ? Place a towel between your skin and the bag. ? Leave the ice on for 20 minutes, 2-3 times a day. General instructions  Take over-the-counter and prescription medicines only as told by your health care provider.  Use medicated creams or suppositories as  told.  Get regular exercise. Ask your health care provider how much and what kind of exercise is best for you. In general, you should do moderate exercise for at least 30 minutes on most days of the week (150 minutes each week). This can include activities such as walking, biking, or yoga.  Go to the bathroom when you have the urge to have a bowel movement. Do not wait.  Avoid straining to have bowel movements.  Keep the anal area dry and clean. Use wet toilet paper or moist towelettes after a bowel movement.  Do not sit on the toilet for long periods of time. This increases blood pooling and pain.  Keep all follow-up visits as told by your health care provider. This is important. Contact a health care provider if you have:  Increasing pain and swelling that are not controlled by treatment or medicine.  Difficulty having a bowel movement, or you are unable to have a bowel movement.  Pain or inflammation outside the area of the hemorrhoids. Get help right away if you have:  Uncontrolled bleeding from your rectum. Summary  Hemorrhoids are swollen veins in and around the rectum or anus.  Most hemorrhoids can be managed with home treatments such as diet and lifestyle changes.  Taking warm sitz baths can help ease pain and discomfort.  In severe cases, procedures or surgery can be done to shrink or remove the hemorrhoids. This information is not intended to replace advice given to you by your health care provider. Make sure you discuss any questions you have with your health care provider. Document Released: 08/20/2000 Document Revised: 01/12/2018 Document Reviewed: 01/12/2018 Elsevier Interactive Patient Education  2019 [employer].

## 2018-09-08 NOTE — Telephone Encounter (Signed)
fyi

## 2018-09-08 NOTE — Progress Notes (Signed)
[name]. is a 53 y.o. male  Chief Complaint  Patient presents with  . Hemorrhoids    HPI: [name]. is a 53 y.o. male complains of hemorrhoids that have been present on/off x years. He states he has had flare for about the past 2 weeks and at times it was painful to sit in the car. The pain has improved. No itching.  He states at times he notes blood on the toilet paper when wiping but last night he saw blood on his pajama pants a little while after having had a BM. Pt states he normally has a BM daily but he has not been eating as well lately (not as much food, skipping meals but is having a energy drink and honey bun for breakfast) and is having BM every other day. He thinks he has to strain to start to have a BM.  He drinks 4-5 6oz glasses of water some days but other days he does not drink that much.  He has been using OTC hemorrhoid cream x 1 week without improvement. 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   He is now working in a funeral home and is requesting Hep B vaccine. He is unsure if he's had the vaccine series.   Past Medical History:  Diagnosis Date  . Allergy   . Anginal pain ([place])    s/p cardiac cath 07/19/13 showing NL coronaries, EF 50-55%  . Anxiety   . Asthma    as child  . B12 DEFICIENCY 03/06/2007  . Cancer [facility])    prostate ca  . CARDIOMYOPATHY, IDIOPATHIC HYPERTROPHIC 03/06/2007  . Clotting disorder ([facility])    knee   . DEPRESSION 03/06/2007  . DVT (deep venous thrombosis) ([place])   . Dysrhythmia    sees [name] for "palpitations"  . GERD 06/01/2007  . History of kidney stones   . HYPERCHOLESTEROLEMIA 03/06/2007  . HYPERTENSION 03/06/2007   [name]  . IMPAIRED GLUCOSE TOLERANCE 11/08/2008  . NEPHROLITHIASIS, HX OF 04/07/2010  . 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 OSTEOARTHRITIS 06/01/2007  . Palpitations 10/23/2007  . Peripheral vascular disease  ([facility]) 03   dvt  . Pneumonia    hx of  . Primary idiopathic hypertrophic cardiomyopathy ([place])   . Prostate cancer ([place])   . Shortness of breath    infreq  . SLEEP APNEA 03/06/2007   uses vpap, last sleep study 2011, Dr. Alanson Puls, at [facility]  . Sleep apnea    wears cpap  . SYNDROME, CHRONIC PAIN 03/06/2007    Past Surgical History:  Procedure Laterality Date  . CARDIAC CATHETERIZATION    . decompression and fusion     cervicothoracic  . EYE SURGERY Bilateral    blepharoplasty  . LEFT HEART CATHETERIZATION WITH CORONARY ANGIOGRAM N/A 07/19/2013   Procedure: LEFT HEART CATHETERIZATION WITH CORONARY ANGIOGRAM;  Surgeon: Jolaine Artist, MD;  Location: [facility] CATH LAB;  Service: Cardiovascular;  Laterality: N/A;  . LEG SKIN LESION  BIOPSY / EXCISION     left leg  . LUMBAR FUSION    . PARTIAL NEPHRECTOMY Left    bx  . PENILE PROSTHESIS IMPLANT    . PROSTATE SURGERY     robotic prostatectomy  . SPINAL CORD STIMULATOR INSERTION N/A 04/11/2014   Procedure: LUMBAR SPINAL CORD STIMULATOR INSERTION;  Surgeon: Newman Pies, MD;  Location: [facility] NEURO ORS;  Service:  Neurosurgery;  Laterality: N/A;  . TONSILLECTOMY    . UPPER GASTROINTESTINAL ENDOSCOPY  2009    Social History   Socioeconomic History  . Marital status: Divorced    Spouse name: Not on file  . Number of children: Not on file  . Years of education: Not on file  . Highest education level: Not on file  Occupational History  . Occupation: Architect: UNEMPLOYED  Social Needs  . Financial resource strain: Not on file  . Food insecurity:    Worry: Not on file    Inability: Not on file  . Transportation needs:    Medical: Not on file    Non-medical: Not on file  Tobacco Use  . Smoking status: Former Smoker    Packs/day: 0.50    Years: 3.00    Pack years: 1.50    Types: Cigarettes    Last attempt to quit: 09/07/1983    Years since quitting: 35.0  . Smokeless tobacco: Never Used  . Tobacco comment:  brief history   Substance and Sexual Activity  . Alcohol use: No    Comment: occ  . Drug use: No  . Sexual activity: Yes  Lifestyle  . Physical activity:    Days per week: Not on file    Minutes per session: Not on file  . Stress: Not on file  Relationships  . Social connections:    Talks on phone: Not on file    Gets together: Not on file    Attends religious service: Not on file    Active member of club or organization: Not on file    Attends meetings of clubs or organizations: Not on file    Relationship status: Not on file  . Intimate partner violence:    Fear of current or ex partner: Not on file    Emotionally abused: Not on file    Physically abused: Not on file    Forced sexual activity: Not on file  Other Topics Concern  . Not on file  Social History Narrative  . Not on file    Family History  Problem Relation Age of Onset  . Heart disease Mother   . Kidney disease Neg Hx        family hx   . Cancer Neg Hx        family hx prostate  . Colon cancer Neg Hx   . Colon polyps Neg Hx   . Rectal cancer Neg Hx   . Stomach cancer Neg Hx      Immunization History  Administered Date(s) Administered  . Influenza Split 06/24/2011, 05/30/2012  . Influenza Whole 09/06/2005, 06/05/2008, 06/09/2009  . Influenza,inj,Quad PF,6+ Mos 07/19/2013, 06/19/2014, 05/27/2016, 08/26/2017  . PPD Test 09/19/2013  . Pneumococcal Polysaccharide-23 09/06/2005, 12/29/2011, 07/19/2013  . Tdap 07/27/2013    Outpatient Encounter Medications as of 09/08/2018  Medication Sig Note  . albuterol (PROVENTIL HFA;VENTOLIN HFA) 108 (90 BASE) MCG/ACT inhaler Inhale 2 puffs into the lungs 2 (two) times daily as needed for wheezing or shortness of breath.    [place] Kitchen amitriptyline (ELAVIL) 25 MG tablet TAKE 1 TABLET (25 MG TOTAL) BY MOUTH AT BEDTIME.   [place] Kitchen atenolol (TENORMIN) 50 MG tablet Take 1 tablet (50 mg total) by mouth daily.   [place] Kitchen atorvastatin (LIPITOR) 40 MG tablet Take 1 tablet (40 mg total) by mouth  daily.   [place] Kitchen azaTHIOprine (IMURAN) 50 MG tablet Take 250 mg by mouth daily.    [place] Kitchen buPROPion [facility]  XL) 300 MG 24 hr tablet TAKE ONE TABLET BY MOUTH EVERY MORNING   . cyanocobalamin (,VITAMIN B-12,) 1000 MCG/ML injection INJECT ONE MILLILITER INJECTION  INTO THE MUSCLE EVERY 30 DAYS, ON OR AFTER THE FIRST OF EACH MONTH AS DIRECTED   . cyclobenzaprine (FLEXERIL) 10 MG tablet Take 1 tablet by mouth daily as needed.   . cycloSPORINE (RESTASIS) 0.05 % ophthalmic emulsion Place 2 drops into both eyes 2 (two) times daily.    [place] Kitchen dicyclomine (BENTYL) 10 MG capsule TAKE 1 CAPSULE BY MOUTH 4 TIMES DAILY   . DULoxetine (CYMBALTA) 30 MG capsule Take 1 capsule (30 mg total) by mouth daily.   . DULoxetine (CYMBALTA) 60 MG capsule TAKE ONE CAPSULE BY MOUTH DAILY   . erythromycin ophthalmic ointment Place 1 application into both eyes at bedtime.  10/15/2014: .  . gabapentin (NEURONTIN) 300 MG capsule TAKE ONE CAPSULE BY MOUTH FOUR TIMES A DAY   . guaiFENesin (MUCINEX) 600 MG 12 hr tablet Take 600 mg by mouth 2 (two) times daily.    [place] Kitchen lidocaine (XYLOCAINE) 5 % ointment Apply 1 application topically daily as needed for moderate pain.    [place] Kitchen NEXIUM 40 MG capsule TAKE ONE CAPSULE BY MOUTH DAILY   . oxyCODONE-acetaminophen (PERCOCET) 10-325 MG tablet Take 1 tablet by mouth See admin instructions. Take 1 tablet by mouth every morning and 1 tablet before 6pm   . Polyethyl Glycol-Propyl Glycol (SYSTANE PRESERVATIVE FREE OP) Place 1 drop into both eyes 2 (two) times daily.   [place] Kitchen triamcinolone (NASACORT AQ) 55 MCG/ACT AERO nasal inhaler Place 1 spray into both nostrils daily as needed (dry mouth).    . triamcinolone ointment (KENALOG) 0.1 % Apply 1 application topically daily.     Facility-Administered Encounter Medications as of 09/08/2018  Medication  . 0.9 %  sodium chloride infusion     ROS: Gen: no fever, chills  Skin: no rash, itching ENT: no ear pain, ear drainage, nasal congestion, rhinorrhea, sinus pressure, sore  throat Eyes: no blurry vision, double vision Resp: no cough, wheeze,SOB CV: no CP, palpitations, LE edema,  GI: no heartburn, n/v/d/c, abd pain; + hemorrhoids GU: no dysuria, urgency, frequency, hematuria MSK: no joint pain, myalgias, back pain Neuro: no dizziness, headache, weakness, vertigo Psych: no depression, anxiety, insomnia   Allergies  Allergen Reactions  . Adhesive [Tape] Other (See Comments)    Skin irritation  . Pollen Extract Other (See Comments)    Stuffy nose    Ht 5\' 9"  (1.753 m)   BMI 30.27 kg/m   Physical Exam  Constitutional: He appears well-developed and well-nourished. No distress.  Abdominal: Soft. Bowel sounds are normal. He exhibits no distension and no mass. There is no abdominal tenderness. There is no rebound and no guarding.  Genitourinary:        A/P:  1. Immunity status testing - Hepatitis B surface antibody,qualitative - will contact pt with result and if not immune, pt will need RN visit to start Hep B series  2. Ulcerated external hemorrhoids - discussed increasing water intake, improving diet, considering stool softener (colace, miralax) to achieve 1 soft BM daily, avoiding heavy lifting or sitting on toilet for too long a time - discussed and included in AVS supportive care for hemorrhoids Rx: - hydrocortisone (ANUSOL-HC) 2.5 % rectal cream; Place 1 application rectally 3 (three) times daily.  Dispense: 30 g; Refill: 1 - Ambulatory referral to Colorectal Surgery Discussed plan and reviewed medications with patient, including risks, benefits, and potential side  effects. Pt expressed understand. All questions answered.

## 2018-09-08 NOTE — Telephone Encounter (Signed)
noted 

## 2018-09-09 LAB — HEPATITIS B SURFACE ANTIBODY,QUALITATIVE: Hep B S Ab: NONREACTIVE

## 2018-09-11 DIAGNOSIS — K645 Perianal venous thrombosis: Secondary | ICD-10-CM | POA: Diagnosis not present

## 2018-09-13 ENCOUNTER — Ambulatory Visit (INDEPENDENT_AMBULATORY_CARE_PROVIDER_SITE_OTHER): Payer: Medicare Other | Admitting: *Deleted

## 2018-09-13 ENCOUNTER — Ambulatory Visit: Payer: Medicare Other

## 2018-09-13 DIAGNOSIS — Z23 Encounter for immunization: Secondary | ICD-10-CM

## 2018-09-13 NOTE — Progress Notes (Signed)
Per orders of Dr. Bryan Lemma, the first injection of Hepatitis B vaccine given by Agnes Lawrence. Patient tolerated injection well.

## 2018-09-14 DIAGNOSIS — R634 Abnormal weight loss: Secondary | ICD-10-CM | POA: Diagnosis not present

## 2018-09-14 DIAGNOSIS — S63602A Unspecified sprain of left thumb, initial encounter: Secondary | ICD-10-CM | POA: Diagnosis not present

## 2018-09-14 DIAGNOSIS — D869 Sarcoidosis, unspecified: Secondary | ICD-10-CM | POA: Diagnosis not present

## 2018-09-14 DIAGNOSIS — M79645 Pain in left finger(s): Secondary | ICD-10-CM | POA: Diagnosis not present

## 2018-09-14 DIAGNOSIS — M654 Radial styloid tenosynovitis [de Quervain]: Secondary | ICD-10-CM | POA: Diagnosis not present

## 2018-09-14 DIAGNOSIS — Z79899 Other long term (current) drug therapy: Secondary | ICD-10-CM | POA: Diagnosis not present

## 2018-09-19 NOTE — Progress Notes (Deleted)
Subjective:   [name]. is a 53 y.o. male who presents for Medicare Annual/Subsequent preventive examination.  Review of Systems:  No ROS.  Medicare Wellness Visit. Additional risk factors are reflected in the social history.    Sleep patterns: {SX; SLEEP PATTERNS:18802::"feels rested on waking","does not get up to void","gets up *** times nightly to void","sleeps *** hours nightly"}.    Home Safety/Smoke Alarms: Feels safe in home. Smoke alarms in place.  Living environment; residence and Firearm Safety: {Rehab home environment / accessibility:30080::"no firearms","firearms stored safely"}. Seat Belt Safety/Bike Helmet: Wears seat belt.    Male:   CCS- 01/2018; possible need for follow-up sooner than 2029 d/t polyps found    PSA-  Lab Results  Component Value Date   PSA 0.02 (L) 11/28/2017   PSA 0.02 (L) 05/03/2016   PSA <0.01 (L) 05/27/2012       Objective:    Vitals: There were no vitals taken for this visit.  There is no height or weight on file to calculate BMI.  Advanced Directives 01/20/2018 08/26/2017 05/14/2016 12/14/2015 11/30/2015 05/19/2015 10/15/2014  Does Patient Have a Medical Advance Directive? No No No No No No No  Would patient like information on creating a medical advance directive? - - No - patient declined information - - - -  Pre-existing out of facility DNR order (yellow form or pink MOST form) - - - - - - -    Tobacco Social History   Tobacco Use  Smoking Status Former Smoker  . Packs/day: 0.50  . Years: 3.00  . Pack years: 1.50  . Types: Cigarettes  . Last attempt to quit: 09/07/1983  . Years since quitting: 35.0  Smokeless Tobacco Never Used  Tobacco Comment   brief history      Counseling given: Not Answered Comment: brief history   Past Medical History:  Diagnosis Date  . Allergy   . Anginal pain ([place])    s/p cardiac cath 07/19/13 showing NL coronaries, EF 50-55%  . Anxiety   . Asthma    as child  . B12 DEFICIENCY 03/06/2007    . Cancer [facility])    prostate ca  . CARDIOMYOPATHY, IDIOPATHIC HYPERTROPHIC 03/06/2007  . Clotting disorder (HCC)    knee   . DEPRESSION 03/06/2007  . DVT (deep venous thrombosis) ([place])   . Dysrhythmia    sees [name] for "palpitations"  . GERD 06/01/2007  . History of kidney stones   . HYPERCHOLESTEROLEMIA 03/06/2007  . HYPERTENSION 03/06/2007   [name]  . IMPAIRED GLUCOSE TOLERANCE 11/08/2008  . NEPHROLITHIASIS, HX OF 04/07/2010  . 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 OSTEOARTHRITIS 06/01/2007  . Palpitations 10/23/2007  . Peripheral vascular disease (HCC) 03   dvt  . Pneumonia    hx of  . Primary idiopathic hypertrophic cardiomyopathy ([place])   . Prostate cancer ([place])   . Shortness of breath    infreq  . SLEEP APNEA 03/06/2007   uses vpap, last sleep study 2011, Dr. Alanson Puls, at [facility]  . Sleep apnea    wears cpap  . SYNDROME, CHRONIC PAIN 03/06/2007   Past Surgical History:  Procedure Laterality Date  . CARDIAC CATHETERIZATION    . decompression and fusion     cervicothoracic  . EYE SURGERY Bilateral    blepharoplasty  . LEFT HEART CATHETERIZATION WITH CORONARY ANGIOGRAM N/A 07/19/2013   Procedure: LEFT HEART CATHETERIZATION WITH CORONARY ANGIOGRAM;  Surgeon: Jolaine Artist, MD;  Location: [facility] CATH LAB;  Service: Cardiovascular;  Laterality:  N/A;  . LEG SKIN LESION  BIOPSY / EXCISION     left leg  . LUMBAR FUSION    . PARTIAL NEPHRECTOMY Left    bx  . PENILE PROSTHESIS IMPLANT    . PROSTATE SURGERY     robotic prostatectomy  . SPINAL CORD STIMULATOR INSERTION N/A 04/11/2014   Procedure: LUMBAR SPINAL CORD STIMULATOR INSERTION;  Surgeon: Newman Pies, MD;  Location: [place] NEURO ORS;  Service: Neurosurgery;  Laterality: N/A;  . TONSILLECTOMY    . UPPER GASTROINTESTINAL ENDOSCOPY  2009   Family History  Problem Relation Age of Onset  . Heart disease Mother   . Kidney disease Neg Hx        family hx   . Cancer Neg Hx        family hx prostate  . Colon cancer  Neg Hx   . Colon polyps Neg Hx   . Rectal cancer Neg Hx   . Stomach cancer Neg Hx    Social History   Socioeconomic History  . Marital status: Divorced    Spouse name: Not on file  . Number of children: Not on file  . Years of education: Not on file  . Highest education level: Not on file  Occupational History  . Occupation: Architect: UNEMPLOYED  Social Needs  . Financial resource strain: Not on file  . Food insecurity:    Worry: Not on file    Inability: Not on file  . Transportation needs:    Medical: Not on file    Non-medical: Not on file  Tobacco Use  . Smoking status: Former Smoker    Packs/day: 0.50    Years: 3.00    Pack years: 1.50    Types: Cigarettes    Last attempt to quit: 09/07/1983    Years since quitting: 35.0  . Smokeless tobacco: Never Used  . Tobacco comment: brief history   Substance and Sexual Activity  . Alcohol use: No    Comment: occ  . Drug use: No  . Sexual activity: Yes  Lifestyle  . Physical activity:    Days per week: Not on file    Minutes per session: Not on file  . Stress: Not on file  Relationships  . Social connections:    Talks on phone: Not on file    Gets together: Not on file    Attends religious service: Not on file    Active member of club or organization: Not on file    Attends meetings of clubs or organizations: Not on file    Relationship status: Not on file  Other Topics Concern  . Not on file  Social History Narrative  . Not on file    Outpatient Encounter Medications as of 09/20/2018  Medication Sig  . albuterol (PROVENTIL HFA;VENTOLIN HFA) 108 (90 BASE) MCG/ACT inhaler Inhale 2 puffs into the lungs 2 (two) times daily as needed for wheezing or shortness of breath.   Marland Kitchen amitriptyline (ELAVIL) 25 MG tablet TAKE 1 TABLET (25 MG TOTAL) BY MOUTH AT BEDTIME.  Marland Kitchen atenolol (TENORMIN) 50 MG tablet Take 1 tablet (50 mg total) by mouth daily.  Marland Kitchen atorvastatin (LIPITOR) 40 MG tablet Take 1 tablet (40 mg total)  by mouth daily.  Marland Kitchen azaTHIOprine (IMURAN) 50 MG tablet Take 250 mg by mouth daily.   Marland Kitchen buPROPion (WELLBUTRIN XL) 300 MG 24 hr tablet TAKE ONE TABLET BY MOUTH EVERY MORNING  . cyanocobalamin (,VITAMIN B-12,) 1000 MCG/ML injection INJECT  ONE MILLILITER INJECTION  INTO THE MUSCLE EVERY 30 DAYS, ON OR AFTER THE FIRST OF EACH MONTH AS DIRECTED  . cyclobenzaprine (FLEXERIL) 10 MG tablet Take 1 tablet by mouth daily as needed.  . cycloSPORINE (RESTASIS) 0.05 % ophthalmic emulsion Place 2 drops into both eyes 2 (two) times daily.   Marland Kitchen dicyclomine (BENTYL) 10 MG capsule TAKE 1 CAPSULE BY MOUTH 4 TIMES DAILY  . DULoxetine (CYMBALTA) 30 MG capsule Take 1 capsule (30 mg total) by mouth daily.  . DULoxetine (CYMBALTA) 60 MG capsule TAKE ONE CAPSULE BY MOUTH DAILY  . erythromycin ophthalmic ointment Place 1 application into both eyes at bedtime.   . gabapentin (NEURONTIN) 300 MG capsule TAKE ONE CAPSULE BY MOUTH FOUR TIMES A DAY  . guaiFENesin (MUCINEX) 600 MG 12 hr tablet Take 600 mg by mouth 2 (two) times daily.   . hydrocortisone (ANUSOL-HC) 2.5 % rectal cream Place 1 application rectally 3 (three) times daily.  Marland Kitchen lidocaine (XYLOCAINE) 5 % ointment Apply 1 application topically daily as needed for moderate pain.   Marland Kitchen NEXIUM 40 MG capsule TAKE ONE CAPSULE BY MOUTH DAILY  . oxyCODONE-acetaminophen (PERCOCET) 10-325 MG tablet Take 1 tablet by mouth See admin instructions. Take 1 tablet by mouth every morning and 1 tablet before 6pm  . Polyethyl Glycol-Propyl Glycol (SYSTANE PRESERVATIVE FREE OP) Place 1 drop into both eyes 2 (two) times daily.  Marland Kitchen triamcinolone (NASACORT AQ) 55 MCG/ACT AERO nasal inhaler Place 1 spray into both nostrils daily as needed (dry mouth).   . triamcinolone ointment (KENALOG) 0.1 % Apply 1 application topically daily.    Facility-Administered Encounter Medications as of 09/20/2018  Medication  . 0.9 %  sodium chloride infusion    Activities of Daily Living No flowsheet data  found.  Patient Care Team: Marletta Lor, MD as PCP - General Dorann Ou, MD as Referring Physician (Pulmonary Disease)   Assessment:   This is a routine wellness examination for Phinehas. Physical assessment deferred to PCP.   Exercise Activities and Dietary recommendations   Diet (meal preparation, eat out, water intake, caffeinated beverages, dairy products, fruits and vegetables): {Desc; diets:16563}       Goals    . Patient Stated     Will try to get to the pool more  [facility]        Fall Risk Fall Risk  08/26/2017  Falls in the past year? No    Depression Screen PHQ 2/9 Scores 08/26/2017 05/27/2016 06/24/2014  PHQ - 2 Score 0 1 0    Cognitive Function MMSE - Mini Mental State Exam 08/26/2017  Not completed: (No Data)        Immunization History  Administered Date(s) Administered  . Hepatitis B, adult 09/13/2018  . Influenza Split 06/24/2011, 05/30/2012  . Influenza Whole 09/06/2005, 06/05/2008, 06/09/2009  . Influenza,inj,Quad PF,6+ Mos 07/19/2013, 06/19/2014, 05/27/2016, 08/26/2017  . PPD Test 09/19/2013  . Pneumococcal Polysaccharide-23 09/06/2005, 12/29/2011, 07/19/2013  . Tdap 07/27/2013    Qualifies for Shingles Vaccine?   Screening Tests Health Maintenance  Topic Date Due  . FOOT EXAM  04/27/1975  . OPHTHALMOLOGY EXAM  04/27/1975  . URINE MICROALBUMIN  04/27/1975  . INFLUENZA VACCINE  04/06/2018  . HEMOGLOBIN A1C  05/31/2018  . TETANUS/TDAP  07/28/2023  . COLONOSCOPY  01/21/2028  . PNEUMOCOCCAL POLYSACCHARIDE VACCINE AGE 17-64 HIGH RISK  Completed  . HIV Screening  Completed      Plan:     I have  personally reviewed and noted the following in the patient's chart:   . Medical and social history . Use of alcohol, tobacco or illicit drugs  . Current medications and supplements . Functional ability and status . Nutritional status . Physical activity . Advanced directives . List of other  physicians . Vitals . Screenings to include cognitive, depression, and falls . Referrals and appointments  In addition, I have reviewed and discussed with patient certain preventive protocols, quality metrics, and best practice recommendations. A written personalized care plan for preventive services as well as general preventive health recommendations were provided to patient.     Alphia Moh, RN  09/19/2018

## 2018-09-20 ENCOUNTER — Telehealth: Payer: Self-pay

## 2018-09-20 ENCOUNTER — Ambulatory Visit: Payer: Medicare Other

## 2018-09-20 NOTE — Telephone Encounter (Signed)
Pt. did not show for health coach visit. Author phoned pt. to attempt to reschedule and remind him about upcoming appointment with Dr. Jerilee Hoh at 0730 tomorrow. 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 AWV rescheduled for next week.

## 2018-09-21 ENCOUNTER — Ambulatory Visit (INDEPENDENT_AMBULATORY_CARE_PROVIDER_SITE_OTHER): Payer: Medicare Other | Admitting: Internal Medicine

## 2018-09-21 VITALS — BP 110/60 | HR 81 | Temp 98.3°F | Ht 72.0 in | Wt 211.4 lb

## 2018-09-21 DIAGNOSIS — D869 Sarcoidosis, unspecified: Secondary | ICD-10-CM | POA: Diagnosis not present

## 2018-09-21 DIAGNOSIS — G4734 Idiopathic sleep related nonobstructive alveolar hypoventilation: Secondary | ICD-10-CM | POA: Diagnosis not present

## 2018-09-21 DIAGNOSIS — E538 Deficiency of other specified B group vitamins: Secondary | ICD-10-CM | POA: Diagnosis not present

## 2018-09-21 DIAGNOSIS — E7800 Pure hypercholesterolemia, unspecified: Secondary | ICD-10-CM | POA: Diagnosis not present

## 2018-09-21 DIAGNOSIS — I10 Essential (primary) hypertension: Secondary | ICD-10-CM

## 2018-09-21 DIAGNOSIS — E739 Lactose intolerance, unspecified: Secondary | ICD-10-CM

## 2018-09-21 DIAGNOSIS — F339 Major depressive disorder, recurrent, unspecified: Secondary | ICD-10-CM | POA: Diagnosis not present

## 2018-09-21 DIAGNOSIS — I421 Obstructive hypertrophic cardiomyopathy: Secondary | ICD-10-CM | POA: Diagnosis not present

## 2018-09-21 NOTE — Patient Instructions (Signed)
-  It was nice meeting you today!  -Schedule follow up after March 2020 for your annual physical. Please come in fasting to that visit.

## 2018-09-21 NOTE — Progress Notes (Signed)
Established Patient Office Visit     CC/Reason for Visit: Establish care, follow-up chronic medical conditions  HPI: [name]. is a 53 y.o. male who is coming in today for the above mentioned reasons.  Due for annual physical exam in March 2020.  Past Medical History is significant for: Neurosarcoidosis, follows with rheumatology, neurology and pulmonology at [facility].  Has been relatively stable recently.  Has history of hypertrophic obstructive cardiomyopathy and follows with [name], was seen recently and advised to follow-up as needed.  He has had negative cath, stress test, cardiac CT.  He tells me that he was having cardiac MRIs due to possibility of cardiac involvement with sarcoid, however these have been discontinued since he had a spinal stimulator placed in and no longer can receive MRIs.  Per his report he has never had any abnormalities on previous cardiac MRIs.  He has a history of chronic pain syndrome and sees a pain clinic and is prescribed oxycodone through them.  He is on azathioprine for his sarcoidosis, he has a history of hypertension that has been well controlled on atenolol, has a history of hyperlipidemia that has been well controlled on a statin.  Has a history of vitamin B12 deficiency and gets monthly B12 injections.  Has a history of depression with stable mood and is on Cymbalta and Wellbutrin prescribed through this office.  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  He was not found to be immune and has had the first of a series.  He has no acute complaints today.   Past Medical/Surgical History: Past Medical History:  Diagnosis Date  . Allergy   . Anginal pain ([place])    s/p cardiac cath 07/19/13 showing NL coronaries, EF 50-55%  . Anxiety   . Asthma    as child  . B12 DEFICIENCY 03/06/2007  . Cancer [facility])    prostate ca  . CARDIOMYOPATHY, IDIOPATHIC HYPERTROPHIC 03/06/2007  .  Clotting disorder (HCC)    knee   . DEPRESSION 03/06/2007  . DVT (deep venous thrombosis) ([place])   . Dysrhythmia    sees [name] for "palpitations"  . GERD 06/01/2007  . History of kidney stones   . HYPERCHOLESTEROLEMIA 03/06/2007  . HYPERTENSION 03/06/2007   [name]  . IMPAIRED GLUCOSE TOLERANCE 11/08/2008  . NEPHROLITHIASIS, HX OF 04/07/2010  . 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 OSTEOARTHRITIS 06/01/2007  . Palpitations 10/23/2007  . Peripheral vascular disease (HCC) 03   dvt  . Pneumonia    hx of  . Primary idiopathic hypertrophic cardiomyopathy ([place])   . Prostate cancer ([place])   . Shortness of breath    infreq  . SLEEP APNEA 03/06/2007   uses vpap, last sleep study 2011, Dr. Alanson Puls, at [facility]  . Sleep apnea    wears cpap  . SYNDROME, CHRONIC PAIN 03/06/2007    Past Surgical History:  Procedure Laterality Date  . CARDIAC CATHETERIZATION    . decompression and fusion     cervicothoracic  . EYE SURGERY Bilateral    blepharoplasty  . LEFT HEART CATHETERIZATION WITH CORONARY ANGIOGRAM N/A 07/19/2013   Procedure: LEFT HEART CATHETERIZATION WITH CORONARY ANGIOGRAM;  Surgeon: Jolaine Artist, MD;  Location: [facility] CATH LAB;  Service: Cardiovascular;  Laterality: N/A;  . LEG SKIN LESION  BIOPSY / EXCISION     left leg  . LUMBAR FUSION    . PARTIAL NEPHRECTOMY Left  bx  . PENILE PROSTHESIS IMPLANT    . PROSTATE SURGERY     robotic prostatectomy  . SPINAL CORD STIMULATOR INSERTION N/A 04/11/2014   Procedure: LUMBAR SPINAL CORD STIMULATOR INSERTION;  Surgeon: Newman Pies, MD;  Location: [place] NEURO ORS;  Service: Neurosurgery;  Laterality: N/A;  . TONSILLECTOMY    . UPPER GASTROINTESTINAL ENDOSCOPY  2009    Social History:  reports that he quit smoking about 35 years ago. His smoking use included cigarettes. He has a 1.50 pack-year smoking history. He has never used smokeless tobacco. He reports that he does not drink alcohol or use drugs.  Allergies: Allergies    Allergen Reactions  . Adhesive [Tape] Other (See Comments)    Skin irritation  . Pollen Extract Other (See Comments)    Stuffy nose    Family History:  Family History  Problem Relation Age of Onset  . Heart disease Mother   . Kidney disease Neg Hx        family hx   . Cancer Neg Hx        family hx prostate  . Colon cancer Neg Hx   . Colon polyps Neg Hx   . Rectal cancer Neg Hx   . Stomach cancer Neg Hx      Current Outpatient Medications:  .  albuterol (PROVENTIL HFA;VENTOLIN HFA) 108 (90 BASE) MCG/ACT inhaler, Inhale 2 puffs into the lungs 2 (two) times daily as needed for wheezing or shortness of breath. , Disp: , Rfl:  .  amitriptyline (ELAVIL) 25 MG tablet, TAKE 1 TABLET (25 MG TOTAL) BY MOUTH AT BEDTIME., Disp: 30 tablet, Rfl: 5 .  atenolol (TENORMIN) 50 MG tablet, Take 1 tablet (50 mg total) by mouth daily., Disp: 90 tablet, Rfl: 3 .  atorvastatin (LIPITOR) 40 MG tablet, Take 1 tablet (40 mg total) by mouth daily., Disp: 90 tablet, Rfl: 3 .  azaTHIOprine (IMURAN) 50 MG tablet, Take 250 mg by mouth daily. , Disp: , Rfl:  .  buPROPion (WELLBUTRIN XL) 300 MG 24 hr tablet, TAKE ONE TABLET BY MOUTH EVERY MORNING, Disp: 90 tablet, Rfl: 1 .  cyanocobalamin (,VITAMIN B-12,) 1000 MCG/ML injection, INJECT ONE MILLILITER INJECTION  INTO THE MUSCLE EVERY 30 DAYS, ON OR AFTER THE FIRST OF EACH MONTH AS DIRECTED, Disp: 1 mL, Rfl: 2 .  cyclobenzaprine (FLEXERIL) 10 MG tablet, Take 1 tablet by mouth daily as needed., Disp: , Rfl:  .  cycloSPORINE (RESTASIS) 0.05 % ophthalmic emulsion, Place 2 drops into both eyes 2 (two) times daily. , Disp: , Rfl:  .  dicyclomine (BENTYL) 10 MG capsule, TAKE 1 CAPSULE BY MOUTH 4 TIMES DAILY, Disp: 360 capsule, Rfl: 0 .  DULoxetine (CYMBALTA) 30 MG capsule, Take 1 capsule (30 mg total) by mouth daily., Disp: 90 capsule, Rfl: 0 .  DULoxetine (CYMBALTA) 60 MG capsule, TAKE ONE CAPSULE BY MOUTH DAILY, Disp: 90 capsule, Rfl: 0 .  erythromycin ophthalmic  ointment, Place 1 application into both eyes at bedtime. , Disp: , Rfl:  .  gabapentin (NEURONTIN) 300 MG capsule, TAKE ONE CAPSULE BY MOUTH FOUR TIMES A DAY, Disp: 360 capsule, Rfl: 0 .  guaiFENesin (MUCINEX) 600 MG 12 hr tablet, Take 600 mg by mouth 2 (two) times daily. , Disp: , Rfl:  .  hydrocortisone (ANUSOL-HC) 2.5 % rectal cream, Place 1 application rectally 3 (three) times daily., Disp: 30 g, Rfl: 1 .  lidocaine (XYLOCAINE) 5 % ointment, Apply 1 application topically daily as needed for moderate pain. ,  Disp: , Rfl:  .  NEXIUM 40 MG capsule, TAKE ONE CAPSULE BY MOUTH DAILY, Disp: 90 capsule, Rfl: 2 .  oxyCODONE-acetaminophen (PERCOCET) 10-325 MG tablet, Take 1 tablet by mouth See admin instructions. Take 1 tablet by mouth every morning and 1 tablet before 6pm, Disp: , Rfl:  .  Polyethyl Glycol-Propyl Glycol (SYSTANE PRESERVATIVE FREE OP), Place 1 drop into both eyes 2 (two) times daily., Disp: , Rfl:  .  triamcinolone (NASACORT AQ) 55 MCG/ACT AERO nasal inhaler, Place 1 spray into both nostrils daily as needed (dry mouth). , Disp: , Rfl:  .  triamcinolone ointment (KENALOG) 0.1 %, Apply 1 application topically daily. , Disp: , Rfl:   Current Facility-Administered Medications:  .  0.9 %  sodium chloride infusion, 500 mL, Intravenous, Once, Danis, Estill Cotta III, MD  Review of Systems:  Constitutional: Denies fever, chills, diaphoresis, appetite change and fatigue.  HEENT: Denies photophobia, eye pain, redness, hearing loss, ear pain, congestion, sore throat, rhinorrhea, sneezing, mouth sores, trouble swallowing, neck pain, neck stiffness and tinnitus.   Respiratory: Denies SOB, DOE, cough, chest tightness,  and wheezing.   Cardiovascular: Denies chest pain, palpitations and leg swelling.  Gastrointestinal: Denies nausea, vomiting, abdominal pain, diarrhea, constipation, blood in stool and abdominal distention.  Genitourinary: Denies dysuria, urgency, frequency, hematuria, flank pain and  difficulty urinating.  Endocrine: Denies: hot or cold intolerance, sweats, changes in hair or nails, polyuria, polydipsia. Musculoskeletal: Denies myalgias, back pain, joint swelling, arthralgias and gait problem.  Skin: Denies pallor, rash and wound.  Neurological: Denies dizziness, seizures, syncope, weakness, light-headedness, numbness and headaches.  Hematological: Denies adenopathy. Easy bruising, personal or family bleeding history  Psychiatric/Behavioral: Denies suicidal ideation, mood changes, confusion, nervousness, sleep disturbance and agitation    Physical Exam: Vitals:   09/21/18 0725  BP: 110/60  Pulse: 81  Temp: 98.3 F (36.8 C)  TempSrc: Oral  SpO2: 98%  Weight: 211 lb 6.4 oz (95.9 kg)  Height: 6' (1.829 m)    Body mass index is 28.67 kg/m.   Constitutional: NAD, calm, comfortable Eyes: PERRL, lids and conjunctivae normal ENMT: Mucous membranes are moist. Respiratory: clear to auscultation bilaterally, no wheezing, no crackles. Normal respiratory effort. No accessory muscle use.  Cardiovascular: Regular rate and rhythm, no murmurs / rubs / gallops. No extremity edema. 2+ pedal pulses. No carotid bruits.  Musculoskeletal: no clubbing / cyanosis. No joint deformity upper and lower extremities. Good ROM, no contractures. Normal muscle tone.  Neurologic: CN 2-12 grossly intact. Sensation intact, DTR normal. Strength 5/5 in all 4.  Psychiatric: Normal judgment and insight. Alert and oriented x 3. Normal mood.    Impression and Plan:  NEUROSARCOIDOSIS -Relatively stable, on azathioprine, followed by [facility].  B12 deficiency -On monthly B12 injections  IMPAIRED GLUCOSE TOLERANCE -Most recent A1c was 5.4 in March 2019, not on any medications.  Pure hypercholesterolemia -Last LDL was 72 in March 2019, he is on Lipitor 40 mg daily.  Depression, recurrent ([place]) -Mood is stable, on Cymbalta and Wellbutrin.  Essential hypertension -Well-controlled on  atenolol  Hypertrophic obstructive cardiomyopathy ([place]) -Followed by cardiology, asymptomatic.  Idiopathic sleep related nonobstructive alveolar hypoventilation -On nightly CPAP for his OSA.    Patient Instructions  -It was nice meeting you today!  -Schedule follow up after March 2020 for your annual physical. Please come in fasting to that visit.     Lelon Frohlich, MD [facility]

## 2018-09-22 DIAGNOSIS — D869 Sarcoidosis, unspecified: Secondary | ICD-10-CM | POA: Diagnosis not present

## 2018-09-22 DIAGNOSIS — G43009 Migraine without aura, not intractable, without status migrainosus: Secondary | ICD-10-CM | POA: Diagnosis not present

## 2018-09-22 DIAGNOSIS — G4733 Obstructive sleep apnea (adult) (pediatric): Secondary | ICD-10-CM | POA: Diagnosis not present

## 2018-09-22 DIAGNOSIS — M961 Postlaminectomy syndrome, not elsewhere classified: Secondary | ICD-10-CM | POA: Diagnosis not present

## 2018-09-22 DIAGNOSIS — G894 Chronic pain syndrome: Secondary | ICD-10-CM | POA: Diagnosis not present

## 2018-09-27 ENCOUNTER — Ambulatory Visit (INDEPENDENT_AMBULATORY_CARE_PROVIDER_SITE_OTHER): Payer: Medicare Other

## 2018-09-27 ENCOUNTER — Telehealth: Payer: Self-pay

## 2018-09-27 VITALS — BP 116/80 | HR 72 | Resp 16 | Ht 69.0 in | Wt 212.0 lb

## 2018-09-27 DIAGNOSIS — Z0000 Encounter for general adult medical examination without abnormal findings: Secondary | ICD-10-CM

## 2018-09-27 DIAGNOSIS — D869 Sarcoidosis, unspecified: Secondary | ICD-10-CM

## 2018-09-27 DIAGNOSIS — F339 Major depressive disorder, recurrent, unspecified: Secondary | ICD-10-CM

## 2018-09-27 DIAGNOSIS — G894 Chronic pain syndrome: Secondary | ICD-10-CM

## 2018-09-27 DIAGNOSIS — E538 Deficiency of other specified B group vitamins: Secondary | ICD-10-CM

## 2018-09-27 DIAGNOSIS — G4734 Idiopathic sleep related nonobstructive alveolar hypoventilation: Secondary | ICD-10-CM

## 2018-09-27 DIAGNOSIS — H0259 Other disorders affecting eyelid function: Secondary | ICD-10-CM

## 2018-09-27 DIAGNOSIS — K219 Gastro-esophageal reflux disease without esophagitis: Secondary | ICD-10-CM

## 2018-09-27 NOTE — Telephone Encounter (Signed)
During AWV, pt. requesting refills on certain medications. Orders left pended for PCP review. CPE scheduled for 11/15/2018 with Dr. Jerilee Hoh.

## 2018-09-27 NOTE — Patient Instructions (Addendum)
Start taking all medications with food. Let us know if you are still feeling sick to your stomach when you do.  Sleep hygiene tips provided. Please follow up with pain specialist and pick up new CPAP supplies.  Please follow up with podiatrist at [facility].   Please follow up with podiatrist at [facility]  Will follow-up with Dr. Jerilee Hoh about request for yearly HIV testing, as well as routine bloodwork.   Good luck continuing with your apprenticeship, and let us know if you need anything to help you get through this difficult time!   Health Maintenance, Male A healthy lifestyle and preventive care is important for your health and wellness. Ask your health care provider about what schedule of regular examinations is right for you. What should I know about weight and diet? Eat a Healthy Diet  Eat plenty of vegetables, fruits, whole grains, low-fat dairy products, and lean protein.  Do not eat a lot of foods high in solid fats, added sugars, or salt.  Maintain a Healthy Weight Regular exercise can help you achieve or maintain a healthy weight. You should:  Do at least 150 minutes of exercise each week. The exercise should increase your heart rate and make you sweat (moderate-intensity exercise).  Do strength-training exercises at least twice a week. Watch Your Levels of Cholesterol and Blood Lipids  Have your blood tested for lipids and cholesterol every 5 years starting at 54 years of age. If you are at high risk for heart disease, you should start having your blood tested when you are 53 years old. You may need to have your cholesterol levels checked more often if: ? Your lipid or cholesterol levels are high. ? You are older than 54 years of age. ? You are at high risk for heart disease. What should I know about cancer screening? Many types of cancers can be detected early and may often be prevented. Lung Cancer  You should be screened every year for lung cancer  if: ? You are a current smoker who has smoked for at least 30 years. ? You are a former smoker who has quit within the past 15 years.  Talk to your health care provider about your screening options, when you should start screening, and how often you should be screened. Colorectal Cancer  Routine colorectal cancer screening usually begins at 54 years of age and should be repeated every 5-10 years until you are 54 years old. You may need to be screened more often if early forms of precancerous polyps or small growths are found. Your health care provider may recommend screening at an earlier age if you have risk factors for colon cancer.  Your health care provider may recommend using home test kits to check for hidden blood in the stool.  A small camera at the end of a tube can be used to examine your colon (sigmoidoscopy or colonoscopy). This checks for the earliest forms of colorectal cancer. Prostate and Testicular Cancer  Depending on your age and overall health, your health care provider may do certain tests to screen for prostate and testicular cancer.  Talk to your health care provider about any symptoms or concerns you have about testicular or prostate cancer. Skin Cancer  Check your skin from head to toe regularly.  Tell your health care provider about any new moles or changes in moles, especially if: ? There is a change in a mole's size, shape, or color. ? You have a mole that  is larger than a pencil eraser.  Always use sunscreen. Apply sunscreen liberally and repeat throughout the day.  Protect yourself by wearing long sleeves, pants, a wide-brimmed hat, and sunglasses when outside. What should I know about heart disease, diabetes, and high blood pressure?  If you are 76-26 years of age, have your blood pressure checked every 3-5 years. If you are 68 years of age or older, have your blood pressure checked every year. You should have your blood pressure measured twice-once when  you are at a hospital or clinic, and once when you are not at a hospital or clinic. Record the average of the two measurements. To check your blood pressure when you are not at a hospital or clinic, you can use: ? An automated blood pressure machine at a pharmacy. ? A home blood pressure monitor.  Talk to your health care provider about your target blood pressure.  If you are between 67-84 years old, ask your health care provider if you should take aspirin to prevent heart disease.  Have regular diabetes screenings by checking your fasting blood sugar level. ? If you are at a normal weight and have a low risk for diabetes, have this test once every three years after the age of 58. ? If you are overweight and have a high risk for diabetes, consider being tested at a younger age or more often.  A one-time screening for abdominal aortic aneurysm (AAA) by ultrasound is recommended for men aged 30-75 years who are current or former smokers. What should I know about preventing infection? Hepatitis B If you have a higher risk for hepatitis B, you should be screened for this virus. Talk with your health care provider to find out if you are at risk for hepatitis B infection. Hepatitis C Blood testing is recommended for:  Everyone born from [birth year] through 1966.  Anyone with known risk factors for hepatitis C. Sexually Transmitted Diseases (STDs)  You should be screened each year for STDs including gonorrhea and chlamydia if: ? You are sexually active and are younger than 54 years of age. ? You are older than 54 years of age and your health care provider tells you that you are at risk for this type of infection. ? Your sexual activity has changed since you were last screened and you are at an increased risk for chlamydia or gonorrhea. Ask your health care provider if you are at risk.  Talk with your health care provider about whether you are at high risk of being infected with HIV. Your health care  provider may recommend a prescription medicine to help prevent HIV infection. What else can I do?  Schedule regular health, dental, and eye exams.  Stay current with your vaccines (immunizations).  Do not use any tobacco products, such as cigarettes, chewing tobacco, and e-cigarettes. If you need help quitting, ask your health care provider.  Limit alcohol intake to no more than 2 drinks per day. One drink equals 12 ounces of beer, 5 ounces of wine, or 1 ounces of hard liquor.  Do not use street drugs.  Do not share needles.  Ask your health care provider for help if you need support or information about quitting drugs.  Tell your health care provider if you often feel depressed.  Tell your health care provider if you have ever been abused or do not feel safe at home. This information is not intended to replace advice given to you by your health care provider. Make  sure you discuss any questions you have with your health care provider. Document Released: 02/19/2008 Document Revised: 04/21/2016 Document Reviewed: 05/27/2015 Elsevier Interactive Patient Education  2019 [employer].

## 2018-09-27 NOTE — Progress Notes (Addendum)
Subjective:   [name]. is a 53 y.o. male who presents for Medicare Annual/Subsequent preventive examination.  Review of Systems:  No ROS.  Medicare Wellness Visit. Additional risk factors are reflected in the social history.  Cardiac Risk Factors include: male gender;hypertension Sleep patterns: has difficulty falling asleep and has interrupted sleep. Recent stressors at work and home, attempting to buy a new house. Sleep hygiene tips provided.  Home Safety/Smoke Alarms: Feels safe in home. Smoke alarms in place.  Living environment; residence and Firearm Safety: 2-story house. No use or need for DME. Seat Belt Safety/Bike Helmet: Wears seat belt.  Male:   CCS- 01/2018, due 2029.    PSA-  Lab Results  Component Value Date   PSA 0.02 (L) 11/28/2017   PSA 0.02 (L) 05/03/2016   PSA <0.01 (L) 05/27/2012       Objective:    Vitals: BP 116/80 (BP Location: Right Arm, Patient Position: Sitting, Cuff Size: Normal)   Pulse 72   Resp 16   Ht 5\' 9"  (1.753 m)   Wt 212 lb (96.2 kg)   SpO2 97%   BMI 31.31 kg/m   Body mass index is 31.31 kg/m.  Advanced Directives 09/27/2018 01/20/2018 08/26/2017 05/14/2016 12/14/2015 11/30/2015 05/19/2015  Does Patient Have a Medical Advance Directive? No No No No No No No  Would patient like information on creating a medical advance directive? No - Patient declined - - No - patient declined information - - -  Pre-existing out of facility DNR order (yellow form or pink MOST form) - - - - - - -    Tobacco Social History   Tobacco Use  Smoking Status Former Smoker  . Packs/day: 0.50  . Years: 3.00  . Pack years: 1.50  . Types: Cigarettes  . Last attempt to quit: 09/07/1983  . Years since quitting: 35.0  Smokeless Tobacco Never Used  Tobacco Comment   brief history      Counseling given: Not Answered Comment: brief history   Past Medical History:  Diagnosis Date  . Allergy   . Anginal pain ([place])    s/p cardiac cath 07/19/13 showing  NL coronaries, EF 50-55%  . Anxiety   . Asthma    as child  . B12 DEFICIENCY 03/06/2007  . Cancer [facility])    prostate ca  . CARDIOMYOPATHY, IDIOPATHIC HYPERTROPHIC 03/06/2007  . Clotting disorder (HCC)    knee   . DEPRESSION 03/06/2007  . DVT (deep venous thrombosis) ([place])   . Dysrhythmia    sees [name] for "palpitations"  . GERD 06/01/2007  . History of kidney stones   . HYPERCHOLESTEROLEMIA 03/06/2007  . HYPERTENSION 03/06/2007   [name]  . IMPAIRED GLUCOSE TOLERANCE 11/08/2008  . NEPHROLITHIASIS, HX OF 04/07/2010  . 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 OSTEOARTHRITIS 06/01/2007  . Palpitations 10/23/2007  . Peripheral vascular disease (HCC) 03   dvt  . Pneumonia    hx of  . Primary idiopathic hypertrophic cardiomyopathy ([place])   . Prostate cancer ([place])   . Shortness of breath    infreq  . SLEEP APNEA 03/06/2007   uses vpap, last sleep study 2011, Dr. Alanson Puls, at [facility]  . Sleep apnea    wears cpap  . SYNDROME, CHRONIC PAIN 03/06/2007   Past Surgical History:  Procedure Laterality Date  . CARDIAC CATHETERIZATION    . decompression and fusion     cervicothoracic  . EYE SURGERY Bilateral    blepharoplasty  . LEFT HEART CATHETERIZATION WITH  CORONARY ANGIOGRAM N/A 07/19/2013   Procedure: LEFT HEART CATHETERIZATION WITH CORONARY ANGIOGRAM;  Surgeon: Jolaine Artist, MD;  Location: [facility] CATH LAB;  Service: Cardiovascular;  Laterality: N/A;  . LEG SKIN LESION  BIOPSY / EXCISION     left leg  . LUMBAR FUSION    . PARTIAL NEPHRECTOMY Left    bx  . PENILE PROSTHESIS IMPLANT    . PROSTATE SURGERY     robotic prostatectomy  . SPINAL CORD STIMULATOR INSERTION N/A 04/11/2014   Procedure: LUMBAR SPINAL CORD STIMULATOR INSERTION;  Surgeon: Newman Pies, MD;  Location: [facility] NEURO ORS;  Service: Neurosurgery;  Laterality: N/A;  . TONSILLECTOMY    . UPPER GASTROINTESTINAL ENDOSCOPY  2009   Family History  Problem Relation Age of Onset  . Heart disease Mother   . Kidney disease  Neg Hx        family hx   . Cancer Neg Hx        family hx prostate  . Colon cancer Neg Hx   . Colon polyps Neg Hx   . Rectal cancer Neg Hx   . Stomach cancer Neg Hx    Social History   Socioeconomic History  . Marital status: Divorced    Spouse name: Not on file  . Number of children: Not on file  . Years of education: Not on file  . Highest education level: Not on file  Occupational History  . 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 Financial resource strain: Not hard at all  . Food insecurity:    Worry: Never true    Inability: Never true  . Transportation needs:    Medical: No    Non-medical: No  Tobacco Use  . Smoking status: Former Smoker    Packs/day: 0.50    Years: 3.00    Pack years: 1.50    Types: Cigarettes    Last attempt to quit: 09/07/1983    Years since quitting: 35.0  . Smokeless tobacco: Never Used  . Tobacco comment: brief history   Substance and Sexual Activity  . Alcohol use: No    Comment: occ  . Drug use: No  . Sexual activity: Yes  Lifestyle  . Physical activity:    Days per week: 0 days    Minutes per session: 0 min  . Stress: Rather much  Relationships  . Social connections:    Talks on phone: Three times a week    Gets together: Once a week    Attends religious service: Never    Active member of club or organization: No    Attends meetings of clubs or organizations: Never    Relationship status: Divorced  Other Topics Concern  . Not on file  Social History Narrative   Recently divorced, lives alone, negotiating with wife currently regarding having son live with him, high stress   Has three children, two local, one in [place].   Enjoys swimming, wants to get back into swimming routine   Finished schooling for funeral services, currently finishing up apprenticeship.     Outpatient Encounter Medications as of 09/27/2018  Medication Sig  . albuterol (PROVENTIL HFA;VENTOLIN HFA) 108 (90 BASE) MCG/ACT inhaler Inhale 2 puffs into  the lungs 2 (two) times daily as needed for wheezing or shortness of breath.   Marland Kitchen atenolol (TENORMIN) 50 MG tablet Take 1 tablet (50 mg total) by mouth daily.  Marland Kitchen atorvastatin (LIPITOR) 40 MG tablet Take 1 tablet (40 mg total) by mouth daily.  Marland Kitchen  azaTHIOprine (IMURAN) 50 MG tablet Take 250 mg by mouth daily.   Marland Kitchen buPROPion (WELLBUTRIN XL) 300 MG 24 hr tablet TAKE ONE TABLET BY MOUTH EVERY MORNING  . cyanocobalamin (,VITAMIN B-12,) 1000 MCG/ML injection INJECT ONE MILLILITER INJECTION  INTO THE MUSCLE EVERY 30 DAYS, ON OR AFTER THE FIRST OF EACH MONTH AS DIRECTED  . cyclobenzaprine (FLEXERIL) 10 MG tablet Take 1 tablet by mouth daily as needed.  . cycloSPORINE (RESTASIS) 0.05 % ophthalmic emulsion Place 2 drops into both eyes 2 (two) times daily.   Marland Kitchen dicyclomine (BENTYL) 10 MG capsule TAKE 1 CAPSULE BY MOUTH 4 TIMES DAILY  . DULoxetine (CYMBALTA) 30 MG capsule Take 1 capsule (30 mg total) by mouth daily.  . DULoxetine (CYMBALTA) 60 MG capsule TAKE ONE CAPSULE BY MOUTH DAILY  . erythromycin ophthalmic ointment Place 1 application into both eyes at bedtime.   . gabapentin (NEURONTIN) 300 MG capsule TAKE ONE CAPSULE BY MOUTH FOUR TIMES A DAY  . guaiFENesin (MUCINEX) 600 MG 12 hr tablet Take 600 mg by mouth 2 (two) times daily.   Marland Kitchen lidocaine (XYLOCAINE) 5 % ointment Apply 1 application topically daily as needed for moderate pain.   Marland Kitchen NEXIUM 40 MG capsule TAKE ONE CAPSULE BY MOUTH DAILY  . Polyethyl Glycol-Propyl Glycol (SYSTANE PRESERVATIVE FREE OP) Place 1 drop into both eyes 2 (two) times daily.  Marland Kitchen triamcinolone (NASACORT AQ) 55 MCG/ACT AERO nasal inhaler Place 1 spray into both nostrils daily as needed (dry mouth).   Marland Kitchen amitriptyline (ELAVIL) 25 MG tablet TAKE 1 TABLET (25 MG TOTAL) BY MOUTH AT BEDTIME. (Patient not taking: Reported on 09/27/2018)  . hydrocortisone (ANUSOL-HC) 2.5 % rectal cream Place 1 application rectally 3 (three) times daily. (Patient not taking: Reported on 09/27/2018)  .  oxyCODONE-acetaminophen (PERCOCET) 10-325 MG tablet Take 1 tablet by mouth See admin instructions. Take 1 tablet by mouth every morning and 1 tablet before 6pm  . triamcinolone ointment (KENALOG) 0.1 % Apply 1 application topically daily.    Facility-Administered Encounter Medications as of 09/27/2018  Medication  . 0.9 %  sodium chloride infusion    Activities of Daily Living In your present state of health, do you have any difficulty performing the following activities: 09/27/2018  Hearing? N  Vision? N  Difficulty concentrating or making decisions? N  Walking or climbing stairs? N  Dressing or bathing? N  Doing errands, shopping? N  Preparing Food and eating ? N  Using the Toilet? N  In the past six months, have you accidently leaked urine? N  Do you have problems with loss of bowel control? N  Managing your Medications? N  Managing your Finances? N  Housekeeping or managing your Housekeeping? N  Some recent data might be hidden    Patient Care Team: Isaac Bliss, Rayford Halsted, MD as PCP - General (Internal Medicine) Dorann Ou, MD as Referring Physician (Pulmonary Disease) Celene Squibb., MD as Referring Physician (Neurology)   Assessment:   This is a routine wellness examination for Bradrick. Physical assessment deferred to PCP.   Exercise Activities and Dietary recommendations Current Exercise Habits: The patient does not participate in regular exercise at present, Exercise limited by: neurologic condition(s);cardiac condition(s)  Diet (meal preparation, eat out, water intake, caffeinated beverages, dairy products, fruits and vegetables): in general, an "unhealthy" diet, on average, 1-2 meals per day. 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 "I've been eating a lot of honey buns". Encouraged high-protein throughout the day, having snacks  on hand in the car. Also emphasized need to take medications with food, as pt. stated he has noticed feeling  sick to his stomach so he has not been as compliant with his medications.          Goals    . Patient Stated     Will try to get to the pool more  [facility]     . Patient Stated     Sleep better, return to the [facility] pool        Fall Risk Fall Risk  09/27/2018 08/26/2017  Falls in the past year? 0 No    Depression Screen PHQ 2/9 Scores 09/27/2018 09/27/2018 08/26/2017 05/27/2016  PHQ - 2 Score 0 2 0 1  PHQ- 9 Score 7 - - -    Cognitive Function MMSE - Mini Mental State Exam 08/26/2017  Not completed: (No Data)       Ad8 score reviewed for issues:  Issues making decisions: no  Less interest in hobbies / activities: no  Repeats questions, stories (family complaining): no  Trouble using ordinary gadgets (microwave, computer, phone):no  Forgets the month or year: no  Mismanaging finances: no  Remembering appts: no  Daily problems with thinking and/or memory: no Ad8 score is= 0  Immunization History  Administered Date(s) Administered  . Hepatitis B, adult 09/13/2018  . Influenza Split 06/24/2011, 05/30/2012  . Influenza Whole 09/06/2005, 06/05/2008, 06/09/2009  . Influenza,inj,Quad PF,6+ Mos 07/19/2013, 06/19/2014, 05/27/2016, 08/26/2017  . PPD Test 09/19/2013  . Pneumococcal Polysaccharide-23 09/06/2005, 12/29/2011, 07/19/2013  . Tdap 07/27/2013    Screening Tests Health Maintenance  Topic Date Due  . FOOT EXAM  04/27/1975  . URINE MICROALBUMIN  04/27/1975  . HEMOGLOBIN A1C  05/31/2018  . OPHTHALMOLOGY EXAM  07/08/2019  . TETANUS/TDAP  07/28/2023  . COLONOSCOPY  01/21/2028  . INFLUENZA VACCINE  Completed  . PNEUMOCOCCAL POLYSACCHARIDE VACCINE AGE 62-64 HIGH RISK  Completed  . HIV Screening  Completed      Plan:    Start taking all medications with food. Let us know if you are still feeling sick to your stomach when you do.  Sleep hygiene tips provided. Please follow up with pain specialist and pick up new CPAP  supplies.  Please follow up with podiatrist at [facility].   Will follow-up with Dr. Jerilee Hoh about request for yearly HIV testing, as well as routine bloodwork.   Good luck continuing with your apprenticeship, and let us know if you need anything to help you get through this difficult time!  I have personally reviewed and noted the following in the patient's chart:   . Medical and social history . Use of alcohol, tobacco or illicit drugs  . Current medications and supplements . Functional ability and status . Nutritional status . Physical activity . Advanced directives . List of other physicians . Vitals . Screenings to include cognitive, depression, and falls . Referrals and appointments  In addition, I have reviewed and discussed with patient certain preventive protocols, quality metrics, and best practice recommendations. A written personalized care plan for preventive services as well as general preventive health recommendations were provided to patient.     Alphia Moh, RN  09/27/2018  Medical screening examination/treatment was performed by qualified clinical staff member and, as supervising physician, I was immediately available for consultation/collaboration. I have reviewed documentation and agree with assessment and plan.  Lelon Frohlich, MD

## 2018-10-02 NOTE — Telephone Encounter (Signed)
Re-routed to PCP to address refill requests. Orders left pended for review.

## 2018-10-05 MED ORDER — ALBUTEROL SULFATE HFA 108 (90 BASE) MCG/ACT IN AERS: 2.0000 | INHALATION_SPRAY | Freq: Two times a day (BID) | RESPIRATORY_TRACT | 1 refills | Status: DC | PRN

## 2018-10-05 MED ORDER — DULOXETINE HCL 60 MG PO CPEP: 60.0000 mg | ORAL_CAPSULE | Freq: Every day | ORAL | 1 refills | Status: DC

## 2018-10-05 MED ORDER — BUPROPION HCL ER (XL) 300 MG PO TB24: 300.0000 mg | ORAL_TABLET | Freq: Every morning | ORAL | 1 refills | Status: DC

## 2018-10-05 MED ORDER — CYANOCOBALAMIN 1000 MCG/ML IJ SOLN: INTRAMUSCULAR | 2 refills | Status: DC

## 2018-10-05 MED ORDER — TRIAMCINOLONE ACETONIDE 55 MCG/ACT NA AERO: 1.0000 | INHALATION_SPRAY | Freq: Every day | NASAL | 2 refills | Status: DC | PRN

## 2018-10-05 MED ORDER — NEXIUM 40 MG PO CPDR: 40.0000 mg | DELAYED_RELEASE_CAPSULE | Freq: Every day | ORAL | 1 refills | Status: DC

## 2018-10-05 NOTE — Telephone Encounter (Signed)
Message from 1/22 re-routed to PCP and CMA as well to address med refills ASAP.

## 2018-10-05 NOTE — Telephone Encounter (Signed)
Refills sent

## 2018-10-13 ENCOUNTER — Telehealth: Payer: Self-pay | Admitting: *Deleted

## 2018-10-13 NOTE — Telephone Encounter (Signed)
[name] faxed a note stating a prior [name] is needed for [name] and [name] .  Unable to send through [email or web address] with insurance info provided as a note stating a matching patient could not be found.  I called the pharmacy and spoke with [name], she stated no prior auth is needed as the Nexium was only $3 and the inhaler was changed to [name] and the pt picked these up.

## 2018-11-02 DIAGNOSIS — S6982XA Other specified injuries of left wrist, hand and finger(s), initial encounter: Secondary | ICD-10-CM | POA: Diagnosis not present

## 2018-11-14 DIAGNOSIS — G43009 Migraine without aura, not intractable, without status migrainosus: Secondary | ICD-10-CM | POA: Diagnosis not present

## 2018-11-14 DIAGNOSIS — M545 Low back pain: Secondary | ICD-10-CM | POA: Diagnosis not present

## 2018-11-14 DIAGNOSIS — R200 Anesthesia of skin: Secondary | ICD-10-CM | POA: Diagnosis not present

## 2018-11-14 DIAGNOSIS — R51 Headache: Secondary | ICD-10-CM | POA: Diagnosis not present

## 2018-11-14 DIAGNOSIS — G4733 Obstructive sleep apnea (adult) (pediatric): Secondary | ICD-10-CM | POA: Diagnosis not present

## 2018-11-14 DIAGNOSIS — Z969 Presence of functional implant, unspecified: Secondary | ICD-10-CM | POA: Diagnosis not present

## 2018-11-15 ENCOUNTER — Other Ambulatory Visit: Payer: Self-pay

## 2018-11-15 ENCOUNTER — Ambulatory Visit: Payer: Medicare Other

## 2018-11-15 ENCOUNTER — Ambulatory Visit (INDEPENDENT_AMBULATORY_CARE_PROVIDER_SITE_OTHER): Payer: Medicare Other | Admitting: Internal Medicine

## 2018-11-15 ENCOUNTER — Encounter: Payer: Self-pay | Admitting: Internal Medicine

## 2018-11-15 VITALS — BP 110/70 | HR 77 | Temp 97.9°F | Ht 69.0 in | Wt 209.8 lb

## 2018-11-15 DIAGNOSIS — D869 Sarcoidosis, unspecified: Secondary | ICD-10-CM

## 2018-11-15 DIAGNOSIS — E119 Type 2 diabetes mellitus without complications: Secondary | ICD-10-CM

## 2018-11-15 DIAGNOSIS — E7800 Pure hypercholesterolemia, unspecified: Secondary | ICD-10-CM | POA: Diagnosis not present

## 2018-11-15 DIAGNOSIS — R200 Anesthesia of skin: Secondary | ICD-10-CM | POA: Diagnosis not present

## 2018-11-15 DIAGNOSIS — M79645 Pain in left finger(s): Secondary | ICD-10-CM | POA: Diagnosis not present

## 2018-11-15 DIAGNOSIS — E739 Lactose intolerance, unspecified: Secondary | ICD-10-CM | POA: Diagnosis not present

## 2018-11-15 DIAGNOSIS — R21 Rash and other nonspecific skin eruption: Secondary | ICD-10-CM

## 2018-11-15 DIAGNOSIS — G894 Chronic pain syndrome: Secondary | ICD-10-CM | POA: Diagnosis not present

## 2018-11-15 DIAGNOSIS — M542 Cervicalgia: Secondary | ICD-10-CM | POA: Diagnosis not present

## 2018-11-15 DIAGNOSIS — Z20828 Contact with and (suspected) exposure to other viral communicable diseases: Secondary | ICD-10-CM

## 2018-11-15 DIAGNOSIS — Z114 Encounter for screening for human immunodeficiency virus [HIV]: Secondary | ICD-10-CM | POA: Diagnosis not present

## 2018-11-15 DIAGNOSIS — Z0000 Encounter for general adult medical examination without abnormal findings: Secondary | ICD-10-CM

## 2018-11-15 DIAGNOSIS — I10 Essential (primary) hypertension: Secondary | ICD-10-CM

## 2018-11-15 DIAGNOSIS — Z23 Encounter for immunization: Secondary | ICD-10-CM | POA: Diagnosis not present

## 2018-11-15 DIAGNOSIS — C61 Malignant neoplasm of prostate: Secondary | ICD-10-CM | POA: Diagnosis not present

## 2018-11-15 DIAGNOSIS — K219 Gastro-esophageal reflux disease without esophagitis: Secondary | ICD-10-CM | POA: Diagnosis not present

## 2018-11-15 LAB — LIPID PANEL
Cholesterol: 143 mg/dL (ref 0–200)
HDL: 55 mg/dL (ref >39.00)
LDL Cholesterol: 75 mg/dL (ref 0–99)
NonHDL: 88.27
Total CHOL/HDL Ratio: 3
Triglycerides: 66 mg/dL (ref 0.0–149.0)
VLDL: 13.2 mg/dL (ref 0.0–40.0)

## 2018-11-15 LAB — COMPREHENSIVE METABOLIC PANEL
ALT: 14 U/L (ref 0–53)
AST: 18 U/L (ref 0–37)
Albumin: 4.4 g/dL (ref 3.5–5.2)
Alkaline Phosphatase: 61 U/L (ref 39–117)
BUN: 18 mg/dL (ref 6–23)
CO2: 26 mEq/L (ref 19–32)
Calcium: 9.2 mg/dL (ref 8.4–10.5)
Chloride: 106 mEq/L (ref 96–112)
Creatinine, Ser: 0.91 mg/dL (ref 0.40–1.50)
GFR: 105.23 mL/min (ref >60.00)
Glucose, Bld: 81 mg/dL (ref 70–99)
Potassium: 4 mEq/L (ref 3.5–5.1)
Sodium: 139 mEq/L (ref 135–145)
Total Bilirubin: 0.7 mg/dL (ref 0.2–1.2)
Total Protein: 6.8 g/dL (ref 6.0–8.3)

## 2018-11-15 LAB — CBC WITH DIFFERENTIAL/PLATELET
Basophils Absolute: 0 10*3/uL (ref 0.0–0.1)
Basophils Relative: 1.3 % (ref 0.0–3.0)
Eosinophils Absolute: 0.1 10*3/uL (ref 0.0–0.7)
Eosinophils Relative: 5.7 % — ABNORMAL HIGH (ref 0.0–5.0)
HCT: 40.9 % (ref 39.0–52.0)
Hemoglobin: 14.2 g/dL (ref 13.0–17.0)
Lymphocytes Relative: 30 % (ref 12.0–46.0)
Lymphs Abs: 0.8 10*3/uL (ref 0.7–4.0)
MCHC: 34.6 g/dL (ref 30.0–36.0)
MCV: 88.9 fl (ref 78.0–100.0)
Monocytes Absolute: 0.5 10*3/uL (ref 0.1–1.0)
Monocytes Relative: 20.9 % — ABNORMAL HIGH (ref 3.0–12.0)
Neutro Abs: 1.1 10*3/uL — ABNORMAL LOW (ref 1.4–7.7)
Neutrophils Relative %: 42.1 % — ABNORMAL LOW (ref 43.0–77.0)
Platelets: 288 10*3/uL (ref 150.0–400.0)
RBC: 4.6 Mil/uL (ref 4.22–5.81)
RDW: 13.9 % (ref 11.5–15.5)
WBC: 2.6 10*3/uL — ABNORMAL LOW (ref 4.0–10.5)

## 2018-11-15 LAB — HEMOGLOBIN A1C: Hgb A1c MFr Bld: 5.2 % (ref 4.6–6.5)

## 2018-11-15 LAB — PSA: PSA: 0.02 ng/mL — ABNORMAL LOW (ref 0.10–4.00)

## 2018-11-15 LAB — TSH: TSH: 2.43 u[IU]/mL (ref 0.35–4.50)

## 2018-11-15 NOTE — Progress Notes (Signed)
Established Patient Office Visit     CC/Reason for Visit: Annual follow up of chronic medical conditions  HPI: [name]. is a 53 y.o. male who is coming in today for the above mentioned reasons. Past Medical History is significant for hypertension, hyperlipidemia, prostate ca, OA, asthma, OSA and chronic back pain.  Patient is now working at a funeral home and is requesting Hep B vaccine and HIV screening.  He will receive the second vaccine today and will get the last vaccine in four months.  Patient injured his thumb and tore a ligament, so he is wearing a brace.  He was seen by a hand surgeon and was told possible surgery; he has an appointment this afternoon due to possible carpal tunnel.  He is UTD on his eye exam, but is due for a dental visit.  He had a colonoscopy last year and is due to repeat in 10 years.  He attends a pain clinic due to chronic back pain and receives injections and is hoping to have his stimulator "updated". Has some skin lesions that are typical of his sarcoid in the back of his neck and would like to go back to see dermatology.   Past Medical/Surgical History: Past Medical History:  Diagnosis Date  . Allergy   . Anginal pain ([place])    s/p cardiac cath 07/19/13 showing NL coronaries, EF 50-55%  . Anxiety   . Asthma    as child  . B12 DEFICIENCY 03/06/2007  . Cancer [facility])    prostate ca  . CARDIOMYOPATHY, IDIOPATHIC HYPERTROPHIC 03/06/2007  . Clotting disorder (HCC)    knee   . DEPRESSION 03/06/2007  . DVT (deep venous thrombosis) ([place])   . Dysrhythmia    sees [name] for "palpitations"  . GERD 06/01/2007  . History of kidney stones   . HYPERCHOLESTEROLEMIA 03/06/2007  . HYPERTENSION 03/06/2007   [name]  . IMPAIRED GLUCOSE TOLERANCE 11/08/2008  . NEPHROLITHIASIS, HX OF 04/07/2010  . 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 OSTEOARTHRITIS 06/01/2007  . Palpitations 10/23/2007  . Peripheral vascular disease (HCC) 03   dvt  . Pneumonia    hx of   . Primary idiopathic hypertrophic cardiomyopathy ([place])   . Prostate cancer ([place])   . Shortness of breath    infreq  . SLEEP APNEA 03/06/2007   uses vpap, last sleep study 2011, Dr. Alanson Puls, at [facility]  . Sleep apnea    wears cpap  . SYNDROME, CHRONIC PAIN 03/06/2007    Past Surgical History:  Procedure Laterality Date  . CARDIAC CATHETERIZATION    . decompression and fusion     cervicothoracic  . EYE SURGERY Bilateral    blepharoplasty  . LEFT HEART CATHETERIZATION WITH CORONARY ANGIOGRAM N/A 07/19/2013   Procedure: LEFT HEART CATHETERIZATION WITH CORONARY ANGIOGRAM;  Surgeon: Jolaine Artist, MD;  Location: [facility] CATH LAB;  Service: Cardiovascular;  Laterality: N/A;  . LEG SKIN LESION  BIOPSY / EXCISION     left leg  . LUMBAR FUSION    . PARTIAL NEPHRECTOMY Left    bx  . PENILE PROSTHESIS IMPLANT    . PROSTATE SURGERY     robotic prostatectomy  . SPINAL CORD STIMULATOR INSERTION N/A 04/11/2014   Procedure: LUMBAR SPINAL CORD STIMULATOR INSERTION;  Surgeon: Newman Pies, MD;  Location: [facility] NEURO ORS;  Service: Neurosurgery;  Laterality: N/A;  . TONSILLECTOMY    . UPPER GASTROINTESTINAL ENDOSCOPY  2009    Social History:  reports that he quit  smoking about 35 years ago. His smoking use included cigarettes. He has a 1.50 pack-year smoking history. He has never used smokeless tobacco. He reports that he does not drink alcohol or use drugs.  Allergies: Allergies  Allergen Reactions  . Adhesive [Tape] Other (See Comments)    Skin irritation  . Pollen Extract Other (See Comments)    Stuffy nose    Family History:  Family History  Problem Relation Age of Onset  . Heart disease Mother   . Kidney disease Neg Hx        family hx   . Cancer Neg Hx        family hx prostate  . Colon cancer Neg Hx   . Colon polyps Neg Hx   . Rectal cancer Neg Hx   . Stomach cancer Neg Hx      Current Outpatient Medications:  .  albuterol (PROVENTIL HFA;VENTOLIN HFA) 108 (90 Base)  MCG/ACT inhaler, Inhale 2 puffs into the lungs 2 (two) times daily as needed for wheezing or shortness of breath., Disp: 18 g, Rfl: 1 .  amitriptyline (ELAVIL) 25 MG tablet, TAKE 1 TABLET (25 MG TOTAL) BY MOUTH AT BEDTIME., Disp: 30 tablet, Rfl: 5 .  atenolol (TENORMIN) 50 MG tablet, Take 1 tablet (50 mg total) by mouth daily., Disp: 90 tablet, Rfl: 3 .  atorvastatin (LIPITOR) 40 MG tablet, Take 1 tablet (40 mg total) by mouth daily., Disp: 90 tablet, Rfl: 3 .  azaTHIOprine (IMURAN) 50 MG tablet, Take 250 mg by mouth daily. , Disp: , Rfl:  .  buPROPion (WELLBUTRIN XL) 300 MG 24 hr tablet, Take 1 tablet (300 mg total) by mouth every morning., Disp: 90 tablet, Rfl: 1 .  cyanocobalamin (,VITAMIN B-12,) 1000 MCG/ML injection, INJECT ONE MILLILITER INJECTION  INTO THE MUSCLE EVERY 30 DAYS, ON OR AFTER THE FIRST OF EACH MONTH AS DIRECTED, Disp: 1 mL, Rfl: 2 .  cyclobenzaprine (FLEXERIL) 10 MG tablet, Take 1 tablet by mouth daily as needed., Disp: , Rfl:  .  cycloSPORINE (RESTASIS) 0.05 % ophthalmic emulsion, Place 2 drops into both eyes 2 (two) times daily. , Disp: , Rfl:  .  dicyclomine (BENTYL) 10 MG capsule, TAKE 1 CAPSULE BY MOUTH 4 TIMES DAILY, Disp: 360 capsule, Rfl: 0 .  DULoxetine (CYMBALTA) 30 MG capsule, Take 1 capsule (30 mg total) by mouth daily., Disp: 90 capsule, Rfl: 0 .  DULoxetine (CYMBALTA) 60 MG capsule, Take 1 capsule (60 mg total) by mouth daily., Disp: 90 capsule, Rfl: 1 .  erythromycin ophthalmic ointment, Place 1 application into both eyes at bedtime. , Disp: , Rfl:  .  gabapentin (NEURONTIN) 300 MG capsule, TAKE ONE CAPSULE BY MOUTH FOUR TIMES A DAY, Disp: 360 capsule, Rfl: 0 .  guaiFENesin (MUCINEX) 600 MG 12 hr tablet, Take 600 mg by mouth 2 (two) times daily. , Disp: , Rfl:  .  hydrocortisone (ANUSOL-HC) 2.5 % rectal cream, Place 1 application rectally 3 (three) times daily., Disp: 30 g, Rfl: 1 .  lidocaine (XYLOCAINE) 5 % ointment, Apply 1 application topically daily as needed  for moderate pain. , Disp: , Rfl:  .  NEXIUM 40 MG capsule, Take 1 capsule (40 mg total) by mouth daily., Disp: 90 capsule, Rfl: 1 .  nortriptyline (PAMELOR) 25 MG capsule, Take 25 mg by mouth at bedtime., Disp: , Rfl:  .  oxyCODONE-acetaminophen (PERCOCET) 10-325 MG tablet, Take 1 tablet by mouth See admin instructions. Take 1 tablet by mouth every morning and 1 tablet  before 6pm, Disp: , Rfl:  .  Polyethyl Glycol-Propyl Glycol (SYSTANE PRESERVATIVE FREE OP), Place 1 drop into both eyes 2 (two) times daily., Disp: , Rfl:  .  triamcinolone (NASACORT AQ) 55 MCG/ACT AERO nasal inhaler, Place 1 spray into the nose daily as needed (dry mouth)., Disp: 1 Inhaler, Rfl: 2 .  triamcinolone ointment (KENALOG) 0.1 %, Apply 1 application topically daily. , Disp: , Rfl:   Current Facility-Administered Medications:  .  0.9 %  sodium chloride infusion, 500 mL, Intravenous, Once, Danis, Estill Cotta III, MD  Review of Systems:  Constitutional: Denies fever, chills, diaphoresis, appetite change and fatigue.  HEENT: Denies photophobia, eye pain, redness, hearing loss, ear pain, congestion, sore throat, rhinorrhea, sneezing, mouth sores, trouble swallowing, neck pain, neck stiffness and tinnitus.   Respiratory: Denies SOB, DOE, cough, chest tightness,  and wheezing.   Cardiovascular: Denies chest pain, palpitations and leg swelling.  Gastrointestinal: Denies nausea, vomiting, abdominal pain, diarrhea, constipation, blood in stool and abdominal distention.  Genitourinary: Denies dysuria, urgency, frequency, hematuria, flank pain and difficulty urinating.  Endocrine: Denies: hot or cold intolerance, sweats, changes in hair or nails, polyuria, polydipsia. Musculoskeletal: Denies joint swelling,  and gait problem. C/o chronic back pain and left thumb ligament tear Skin: Denies pallor or wound.  C/o rash to neck area Neurological: Denies dizziness, seizures, syncope, weakness, light-headedness, numbness and headaches.    Hematological: Denies adenopathy. Easy bruising, personal or family bleeding history  Psychiatric/Behavioral: Denies suicidal ideation, mood changes, confusion, nervousness, sleep disturbance and agitation   Physical Exam: Vitals:   11/15/18 0657  BP: 110/70  Pulse: 77  Temp: 97.9 F (36.6 C)  TempSrc: Oral  SpO2: 97%  Weight: 209 lb 12.8 oz (95.2 kg)  Height: _0  (1.753 m)    Body mass index is 30.98 kg/m.   Constitutional: NAD, calm, comfortable Eyes: PERRL, lids and conjunctivae normal ENMT: Mucous membranes are moist. Posterior pharynx clear of any exudate or lesions. Normal dentition. Tympanic membrane is pearly white, no erythema or bulging. Neck: normal, supple, no masses, no thyromegaly Respiratory: clear to auscultation bilaterally, no wheezing, no crackles. Normal respiratory effort. No accessory muscle use.  Cardiovascular: Regular rate and rhythm, no murmurs / rubs / gallops. No extremity edema. 2+ pedal pulses. No carotid bruits.  Abdomen: no tenderness, no masses palpated. No hepatosplenomegaly. Bowel sounds positive.  Musculoskeletal: no clubbing / cyanosis. No joint deformity upper and lower extremities. Good ROM, no contractures. Normal muscle tone.  Skin: no lesions, ulcers. No induration 3 very small areas to back of the neck, clean and dry Neurologic: CN 2-12 grossly intact. Sensation intact, DTR normal. Strength 5/5 in all 4.  Psychiatric: Normal judgment and insight. Alert and oriented x 3. Normal mood.    Impression and Plan:  Essential hypertension  -Well controlled, not on meds.  IMPAIRED GLUCOSE TOLERANCE -Last A1C was 5.4, recheck A1c today, not on meds.  Gastroesophageal reflux disease, esophagitis presence not specified -cont taking nexium as directed -currently asymptomatic  CA of prostate ([place]) -Check PSA, has been released from [facility].  SYNDROME, CHRONIC PAIN -sees pain management provider, has back stimulator and uses  Oxycodone.  Pure hypercholesterolemia  -Last LDL 72 in 3/19. -Not on statin, recheck lipids today.  Sarcoid ([place]) -rash to back of neck and will f/u with dermatology, Dr. Sherryle Lis  Encounter for screening for HIV - Plan:  -HIV antibody (with reflex)  Preventive Care -Second Hep B and PNA today (third Hep B in 4 months). -Advised  routine eye and dental care. -Colonoscopy UTD. -Labs today.    Patient Instructions  Great seeing you this morning.  Instructions: -Blood work will be drawn today and we will notify you of the results -Second pneumonia and Hep B vaccines today. Come back in 4 months for third Hep B. -Schedule follow up in 1 year for annual physical or sooner if needed.   Preventive Care 40-64 Years, Male Preventive care refers to lifestyle choices and visits with your health care provider that can promote health and wellness. What does preventive care include?   A yearly physical exam. This is also called an annual well check.  Dental exams once or twice a year.  Routine eye exams. Ask your health care provider how often you should have your eyes checked.  Personal lifestyle choices, including: ? Daily care of your teeth and gums. ? Regular physical activity. ? Eating a healthy diet. ? Avoiding tobacco and drug use. ? Limiting alcohol use. ? Practicing safe sex. ? Taking low-dose aspirin every day starting at age 70. What happens during an annual well check? The services and screenings done by your health care provider during your annual well check will depend on your age, overall health, lifestyle risk factors, and family history of disease. Counseling Your health care provider may ask you questions about your:  Alcohol use.  Tobacco use.  Drug use.  Emotional well-being.  Home and relationship well-being.  Sexual activity.  Eating habits.  Work and work Statistician. Screening You may have the following tests or measurements:  Height,  weight, and BMI.  Blood pressure.  Lipid and cholesterol levels. These may be checked every 5 years, or more frequently if you are over 45 years old.  Skin check.  Lung cancer screening. You may have this screening every year starting at age 87 if you have a 30-pack-year history of smoking and currently smoke or have quit within the past 15 years.  Colorectal cancer screening. All adults should have this screening starting at age 27 and continuing until age 55. Your health care provider may recommend screening at age 51. You will have tests every 1-10 years, depending on your results and the type of screening test. People at increased risk should start screening at an earlier age. Screening tests may include: ? Guaiac-based fecal occult blood testing. ? Fecal immunochemical test (FIT). ? Stool DNA test. ? Virtual colonoscopy. ? Sigmoidoscopy. During this test, a flexible tube with a tiny camera (sigmoidoscope) is used to examine your rectum and lower colon. The sigmoidoscope is inserted through your anus into your rectum and lower colon. ? Colonoscopy. During this test, a long, thin, flexible tube with a tiny camera (colonoscope) is used to examine your entire colon and rectum.  Prostate cancer screening. Recommendations will vary depending on your family history and other risks.  Hepatitis C blood test.  Hepatitis B blood test.  Sexually transmitted disease (STD) testing.  Diabetes screening. This is done by checking your blood sugar (glucose) after you have not eaten for a while (fasting). You may have this done every 1-3 years. Discuss your test results, treatment options, and if necessary, the need for more tests with your health care provider. Vaccines Your health care provider may recommend certain vaccines, such as:  Influenza vaccine. This is recommended every year.  Tetanus, diphtheria, and acellular pertussis (Tdap, Td) vaccine. You may need a Td booster every 10  years.  Varicella vaccine. You may need this if you have not  been vaccinated.  Zoster vaccine. You may need this after age 17.  Measles, mumps, and rubella (MMR) vaccine. You may need at least one dose of MMR if you were born in 1957 or later. You may also need a second dose.  Pneumococcal 13-valent conjugate (PCV13) vaccine. You may need this if you have certain conditions and have not been vaccinated.  Pneumococcal polysaccharide (PPSV23) vaccine. You may need one or two doses if you smoke cigarettes or if you have certain conditions.  Meningococcal vaccine. You may need this if you have certain conditions.  Hepatitis A vaccine. You may need this if you have certain conditions or if you travel or work in places where you may be exposed to hepatitis A.  Hepatitis B vaccine. You may need this if you have certain conditions or if you travel or work in places where you may be exposed to hepatitis B.  Haemophilus influenzae type b (Hib) vaccine. You may need this if you have certain risk factors. Talk to your health care provider about which screenings and vaccines you need and how often you need them. This information is not intended to replace advice given to you by your health care provider. Make sure you discuss any questions you have with your health care provider. Document Released: 09/19/2015 Document Revised: 10/13/2017 Document Reviewed: 06/24/2015 Elsevier Interactive Patient Education  2019 [place], RN DNP Student [facility]

## 2018-11-15 NOTE — Patient Instructions (Addendum)
Great seeing you this morning.  Instructions: -Blood work will be drawn today and we will notify you of the results -Second pneumonia and Hep B vaccines today. Come back in 4 months for third Hep B. -Schedule follow up in 1 year for annual physical or sooner if needed.   Preventive Care 40-64 Years, Male Preventive care refers to lifestyle choices and visits with your health care provider that can promote health and wellness. What does preventive care include?   A yearly physical exam. This is also called an annual well check.  Dental exams once or twice a year.  Routine eye exams. Ask your health care provider how often you should have your eyes checked.  Personal lifestyle choices, including: ? Daily care of your teeth and gums. ? Regular physical activity. ? Eating a healthy diet. ? Avoiding tobacco and drug use. ? Limiting alcohol use. ? Practicing safe sex. ? Taking low-dose aspirin every day starting at age 54. What happens during an annual well check? The services and screenings done by your health care provider during your annual well check will depend on your age, overall health, lifestyle risk factors, and family history of disease. Counseling Your health care provider may ask you questions about your:  Alcohol use.  Tobacco use.  Drug use.  Emotional well-being.  Home and relationship well-being.  Sexual activity.  Eating habits.  Work and work Statistician. Screening You may have the following tests or measurements:  Height, weight, and BMI.  Blood pressure.  Lipid and cholesterol levels. These may be checked every 5 years, or more frequently if you are over 54 years old.  Skin check.  Lung cancer screening. You may have this screening every year starting at age 54 if you have a 30-pack-year history of smoking and currently smoke or have quit within the past 15 years.  Colorectal cancer screening. All adults should have this screening starting at  age 54 and continuing until age 54. Your health care provider may recommend screening at age 48. You will have tests every 1-10 years, depending on your results and the type of screening test. People at increased risk should start screening at an earlier age. Screening tests may include: ? Guaiac-based fecal occult blood testing. ? Fecal immunochemical test (FIT). ? Stool DNA test. ? Virtual colonoscopy. ? Sigmoidoscopy. During this test, a flexible tube with a tiny camera (sigmoidoscope) is used to examine your rectum and lower colon. The sigmoidoscope is inserted through your anus into your rectum and lower colon. ? Colonoscopy. During this test, a long, thin, flexible tube with a tiny camera (colonoscope) is used to examine your entire colon and rectum.  Prostate cancer screening. Recommendations will vary depending on your family history and other risks.  Hepatitis C blood test.  Hepatitis B blood test.  Sexually transmitted disease (STD) testing.  Diabetes screening. This is done by checking your blood sugar (glucose) after you have not eaten for a while (fasting). You may have this done every 1-3 years. Discuss your test results, treatment options, and if necessary, the need for more tests with your health care provider. Vaccines Your health care provider may recommend certain vaccines, such as:  Influenza vaccine. This is recommended every year.  Tetanus, diphtheria, and acellular pertussis (Tdap, Td) vaccine. You may need a Td booster every 10 years.  Varicella vaccine. You may need this if you have not been vaccinated.  Zoster vaccine. You may need this after age 22.  Measles, mumps, and  rubella (MMR) vaccine. You may need at least one dose of MMR if you were born in 1957 or later. You may also need a second dose.  Pneumococcal 13-valent conjugate (PCV13) vaccine. You may need this if you have certain conditions and have not been vaccinated.  Pneumococcal polysaccharide  (PPSV23) vaccine. You may need one or two doses if you smoke cigarettes or if you have certain conditions.  Meningococcal vaccine. You may need this if you have certain conditions.  Hepatitis A vaccine. You may need this if you have certain conditions or if you travel or work in places where you may be exposed to hepatitis A.  Hepatitis B vaccine. You may need this if you have certain conditions or if you travel or work in places where you may be exposed to hepatitis B.  Haemophilus influenzae type b (Hib) vaccine. You may need this if you have certain risk factors. Talk to your health care provider about which screenings and vaccines you need and how often you need them. This information is not intended to replace advice given to you by your health care provider. Make sure you discuss any questions you have with your health care provider. Document Released: 09/19/2015 Document Revised: 10/13/2017 Document Reviewed: 06/24/2015 Elsevier Interactive Patient Education  2019 [employer].

## 2018-11-16 LAB — HIV ANTIBODY (ROUTINE TESTING W REFLEX): HIV 1&2 Ab, 4th Generation: NONREACTIVE

## 2018-11-23 DIAGNOSIS — D485 Neoplasm of uncertain behavior of skin: Secondary | ICD-10-CM | POA: Diagnosis not present

## 2018-11-23 DIAGNOSIS — L408 Other psoriasis: Secondary | ICD-10-CM | POA: Diagnosis not present

## 2018-11-23 DIAGNOSIS — L308 Other specified dermatitis: Secondary | ICD-10-CM | POA: Diagnosis not present

## 2018-11-23 DIAGNOSIS — L910 Hypertrophic scar: Secondary | ICD-10-CM | POA: Diagnosis not present

## 2018-11-30 DIAGNOSIS — M79645 Pain in left finger(s): Secondary | ICD-10-CM | POA: Diagnosis not present

## 2018-12-08 DIAGNOSIS — D869 Sarcoidosis, unspecified: Secondary | ICD-10-CM | POA: Diagnosis not present

## 2018-12-08 DIAGNOSIS — Z981 Arthrodesis status: Secondary | ICD-10-CM | POA: Diagnosis not present

## 2018-12-08 DIAGNOSIS — M961 Postlaminectomy syndrome, not elsewhere classified: Secondary | ICD-10-CM | POA: Diagnosis not present

## 2018-12-08 DIAGNOSIS — G4733 Obstructive sleep apnea (adult) (pediatric): Secondary | ICD-10-CM | POA: Diagnosis not present

## 2018-12-08 DIAGNOSIS — G894 Chronic pain syndrome: Secondary | ICD-10-CM | POA: Diagnosis not present

## 2018-12-22 DIAGNOSIS — Z79899 Other long term (current) drug therapy: Secondary | ICD-10-CM | POA: Diagnosis not present

## 2018-12-22 DIAGNOSIS — D869 Sarcoidosis, unspecified: Secondary | ICD-10-CM | POA: Diagnosis not present

## 2018-12-22 LAB — BASIC METABOLIC PANEL: Creatinine: 0.9 (ref 0.6–1.3)

## 2019-01-02 DIAGNOSIS — M961 Postlaminectomy syndrome, not elsewhere classified: Secondary | ICD-10-CM | POA: Diagnosis not present

## 2019-01-03 ENCOUNTER — Encounter: Payer: Self-pay | Admitting: Internal Medicine

## 2019-01-15 DIAGNOSIS — M79645 Pain in left finger(s): Secondary | ICD-10-CM | POA: Diagnosis not present

## 2019-01-15 DIAGNOSIS — M545 Low back pain: Secondary | ICD-10-CM | POA: Diagnosis not present

## 2019-01-15 DIAGNOSIS — D869 Sarcoidosis, unspecified: Secondary | ICD-10-CM | POA: Diagnosis not present

## 2019-01-15 DIAGNOSIS — Z79899 Other long term (current) drug therapy: Secondary | ICD-10-CM | POA: Diagnosis not present

## 2019-01-15 DIAGNOSIS — Z01818 Encounter for other preprocedural examination: Secondary | ICD-10-CM | POA: Diagnosis not present

## 2019-01-15 DIAGNOSIS — S63609D Unspecified sprain of unspecified thumb, subsequent encounter: Secondary | ICD-10-CM | POA: Diagnosis not present

## 2019-01-15 DIAGNOSIS — D8689 Sarcoidosis of other sites: Secondary | ICD-10-CM | POA: Diagnosis not present

## 2019-01-15 DIAGNOSIS — M8949 Other hypertrophic osteoarthropathy, multiple sites: Secondary | ICD-10-CM | POA: Diagnosis not present

## 2019-01-15 DIAGNOSIS — R634 Abnormal weight loss: Secondary | ICD-10-CM | POA: Diagnosis not present

## 2019-01-30 DIAGNOSIS — M47816 Spondylosis without myelopathy or radiculopathy, lumbar region: Secondary | ICD-10-CM | POA: Diagnosis not present

## 2019-02-05 DIAGNOSIS — S5322XA Traumatic rupture of left radial collateral ligament, initial encounter: Secondary | ICD-10-CM | POA: Diagnosis not present

## 2019-02-05 DIAGNOSIS — X58XXXA Exposure to other specified factors, initial encounter: Secondary | ICD-10-CM | POA: Diagnosis not present

## 2019-02-05 DIAGNOSIS — Y999 Unspecified external cause status: Secondary | ICD-10-CM | POA: Diagnosis not present

## 2019-02-08 DIAGNOSIS — S5322XA Traumatic rupture of left radial collateral ligament, initial encounter: Secondary | ICD-10-CM | POA: Diagnosis not present

## 2019-02-08 DIAGNOSIS — M79645 Pain in left finger(s): Secondary | ICD-10-CM | POA: Diagnosis not present

## 2019-04-05 DIAGNOSIS — M47816 Spondylosis without myelopathy or radiculopathy, lumbar region: Secondary | ICD-10-CM | POA: Diagnosis not present

## 2019-04-05 DIAGNOSIS — G894 Chronic pain syndrome: Secondary | ICD-10-CM | POA: Diagnosis not present

## 2019-04-05 DIAGNOSIS — D869 Sarcoidosis, unspecified: Secondary | ICD-10-CM | POA: Diagnosis not present

## 2019-04-05 DIAGNOSIS — M542 Cervicalgia: Secondary | ICD-10-CM | POA: Diagnosis not present

## 2019-04-05 DIAGNOSIS — M961 Postlaminectomy syndrome, not elsewhere classified: Secondary | ICD-10-CM | POA: Diagnosis not present

## 2019-04-19 DIAGNOSIS — M961 Postlaminectomy syndrome, not elsewhere classified: Secondary | ICD-10-CM | POA: Diagnosis not present

## 2019-05-03 DIAGNOSIS — M47816 Spondylosis without myelopathy or radiculopathy, lumbar region: Secondary | ICD-10-CM | POA: Diagnosis not present

## 2019-05-28 DIAGNOSIS — Z23 Encounter for immunization: Secondary | ICD-10-CM | POA: Diagnosis not present

## 2019-05-28 DIAGNOSIS — D869 Sarcoidosis, unspecified: Secondary | ICD-10-CM | POA: Diagnosis not present

## 2019-05-28 DIAGNOSIS — Z79899 Other long term (current) drug therapy: Secondary | ICD-10-CM | POA: Diagnosis not present

## 2019-05-28 DIAGNOSIS — R634 Abnormal weight loss: Secondary | ICD-10-CM | POA: Diagnosis not present

## 2019-07-17 DIAGNOSIS — H5210 Myopia, unspecified eye: Secondary | ICD-10-CM | POA: Diagnosis not present

## 2019-07-17 DIAGNOSIS — H524 Presbyopia: Secondary | ICD-10-CM | POA: Diagnosis not present

## 2019-07-17 DIAGNOSIS — H04123 Dry eye syndrome of bilateral lacrimal glands: Secondary | ICD-10-CM | POA: Diagnosis not present

## 2019-07-17 DIAGNOSIS — H527 Unspecified disorder of refraction: Secondary | ICD-10-CM | POA: Diagnosis not present

## 2019-07-17 DIAGNOSIS — H0259 Other disorders affecting eyelid function: Secondary | ICD-10-CM | POA: Diagnosis not present

## 2019-07-17 DIAGNOSIS — D869 Sarcoidosis, unspecified: Secondary | ICD-10-CM | POA: Diagnosis not present

## 2019-07-17 DIAGNOSIS — H53002 Unspecified amblyopia, left eye: Secondary | ICD-10-CM | POA: Diagnosis not present

## 2019-07-17 DIAGNOSIS — H52209 Unspecified astigmatism, unspecified eye: Secondary | ICD-10-CM | POA: Diagnosis not present

## 2019-07-17 DIAGNOSIS — H2513 Age-related nuclear cataract, bilateral: Secondary | ICD-10-CM | POA: Diagnosis not present

## 2019-08-21 ENCOUNTER — Other Ambulatory Visit: Payer: Self-pay

## 2019-08-28 ENCOUNTER — Other Ambulatory Visit: Payer: Self-pay | Admitting: *Deleted

## 2019-08-28 NOTE — Patient Outreach (Signed)
[place] [facility]) Care Management  08/28/2019  Kaydn Shim. 1965-11-05 [id number]   Telephone Screen  Referral Date:  08/21/2019 Referral Source:  EMMI Prevent Reason for Referral:  Screening Insurance:  Medicare   Outreach Attempt:   Outreach attempt #1 to patient for telephone screening.  Patient answered and stated he was not available to talk at this time.  Requested a telephone call back.   Plan:  RN Health Coach will make another outreach attempt within the next 3-4 business days.   [place] [phone] [email or web address]

## 2019-08-29 ENCOUNTER — Other Ambulatory Visit: Payer: Self-pay | Admitting: *Deleted

## 2019-08-29 NOTE — Patient Outreach (Signed)
[place] [facility]) Care Management  08/29/2019  [name]. 09/28/1965 [id number]   Telephone Screen  Referral Date:  08/21/2019 Referral Source:  EMMI Prevent Reason for Referral:  Screening Insurance:  Medicare   Outreach Attempt:  Outreach attempt #2 to patient for telephone screening.  Patient answered and stated he was working and unable to speak at the time.   Plan:  RN Health Coach will send unsuccessful outreach letter to patient.  RN Health Coach will make another outreach attempt to patient within 3-4 business days if no return call back from patient.  [place] [phone] [email or web address]

## 2019-09-03 ENCOUNTER — Other Ambulatory Visit: Payer: Self-pay | Admitting: *Deleted

## 2019-09-03 DIAGNOSIS — D869 Sarcoidosis, unspecified: Secondary | ICD-10-CM | POA: Diagnosis not present

## 2019-09-03 DIAGNOSIS — R519 Headache, unspecified: Secondary | ICD-10-CM | POA: Diagnosis not present

## 2019-09-03 NOTE — Patient Outreach (Signed)
[place] [facility]) Care Management  09/03/2019  Meziah Lieber 12/22/1965 [id number]   Telephone Screen  Referral Date:08/21/2019 Referral Source:EMMI Prevent Reason for Referral:Screening Insurance:Medicare   Outreach Attempt:  Outreach attempt #3 to patient for telephone screening.  Patient answered first outreach and stated he was working and requested call back in 15-30 minutes.  Attempted another outreach and no answer. RN Health Coach left HIPAA compliant voicemail message along with contact information.  Plan:  RN Health Coach will close case if no return call from patient within 10 day time period from Unsuccessful Letter being mailed to patient.  [place] [phone] [email or web address]

## 2019-09-12 ENCOUNTER — Other Ambulatory Visit: Payer: Self-pay | Admitting: *Deleted

## 2019-09-12 NOTE — Patient Outreach (Signed)
[place] [facility]) Care Management  09/12/2019  [name]. 01/23/1966 [id number]   Telephone Screen/Case Closure  Referral Date:08/21/2019 Referral Source:EMMI Prevent Reason for Referral:Screening Insurance:Medicare   Outreach Attempt:  Multiple attempts to establish contact with patient without success. No response from letter mailed to patient. Case is being closed at this time.   Plan:  RN Health Coach will close case based on inability to establish contact with patient.   [place] [phone] [name]

## 2019-09-26 ENCOUNTER — Telehealth: Payer: Self-pay | Admitting: Internal Medicine

## 2019-09-26 NOTE — Telephone Encounter (Signed)
Cancelled AWV with Nurse Health Advisor since Dr. Jerilee Hoh does her own.  Pt is needing to reschedule with PCP.   Please advise

## 2019-09-27 DIAGNOSIS — G4733 Obstructive sleep apnea (adult) (pediatric): Secondary | ICD-10-CM | POA: Diagnosis not present

## 2019-09-27 DIAGNOSIS — R21 Rash and other nonspecific skin eruption: Secondary | ICD-10-CM | POA: Diagnosis not present

## 2019-09-27 DIAGNOSIS — D869 Sarcoidosis, unspecified: Secondary | ICD-10-CM | POA: Diagnosis not present

## 2019-09-27 DIAGNOSIS — Z79899 Other long term (current) drug therapy: Secondary | ICD-10-CM | POA: Diagnosis not present

## 2019-09-27 DIAGNOSIS — G8929 Other chronic pain: Secondary | ICD-10-CM | POA: Diagnosis not present

## 2019-09-27 DIAGNOSIS — M545 Low back pain: Secondary | ICD-10-CM | POA: Diagnosis not present

## 2019-09-28 ENCOUNTER — Ambulatory Visit: Payer: Medicare Other

## 2019-10-28 ENCOUNTER — Ambulatory Visit: Payer: Medicare Other | Attending: Internal Medicine

## 2019-10-28 DIAGNOSIS — Z23 Encounter for immunization: Secondary | ICD-10-CM | POA: Insufficient documentation

## 2019-10-28 NOTE — Progress Notes (Signed)
   [id number] Vaccination [facility]  Name:  Emidio Ream.    MRN: [id number] DOB: 04/22/1966  10/28/2019  Mr. Chalifour was observed post Covid-19 immunization for 15 minutes without incidence. He was provided with Vaccine Information Sheet and instruction to access the V-Safe system.   Mr. Fecteau was instructed to call 911 with any severe reactions post vaccine: Marland Kitchen Difficulty breathing  . Swelling of your face and throat  . A fast heartbeat  . A bad rash all over your body  . Dizziness and weakness    Immunizations Administered    Name Date Dose VIS Date Route   Pfizer COVID-19 Vaccine 10/28/2019  3:06 PM 0.3 mL 08/17/2019 Intramuscular   Manufacturer: [place]   Lot: [id number]   [place]: [id number]

## 2019-11-21 ENCOUNTER — Ambulatory Visit: Payer: Medicare Other | Attending: Internal Medicine

## 2019-11-21 DIAGNOSIS — Z23 Encounter for immunization: Secondary | ICD-10-CM

## 2019-11-21 NOTE — Progress Notes (Signed)
   [id number]  Name:  [name].    MRN: [id number] DOB: 06/12/1966  11/21/2019  [name] was observed post Covid-19 immunization for 15 minutes without incident. He was provided with Vaccine Information Sheet and instruction to access the V-Safe system.   [name] was instructed to call 911 with any severe reactions post vaccine: Marland Kitchen Difficulty breathing  . Swelling of face and throat  . A fast heartbeat  . A bad rash all over body  . Dizziness and weakness   Immunizations Administered    Name Date Dose VIS Date Route   Pfizer COVID-19 Vaccine 11/21/2019  4:52 PM 0.3 mL 08/17/2019 Intramuscular   Manufacturer: [place]   Lot: [id number]   [place]: [id number]

## 2019-11-26 DIAGNOSIS — M47816 Spondylosis without myelopathy or radiculopathy, lumbar region: Secondary | ICD-10-CM | POA: Diagnosis not present

## 2019-11-26 DIAGNOSIS — M542 Cervicalgia: Secondary | ICD-10-CM | POA: Diagnosis not present

## 2019-11-26 DIAGNOSIS — G894 Chronic pain syndrome: Secondary | ICD-10-CM | POA: Diagnosis not present

## 2019-11-26 DIAGNOSIS — G4733 Obstructive sleep apnea (adult) (pediatric): Secondary | ICD-10-CM | POA: Diagnosis not present

## 2019-12-04 DIAGNOSIS — M47816 Spondylosis without myelopathy or radiculopathy, lumbar region: Secondary | ICD-10-CM | POA: Diagnosis not present

## 2019-12-26 DIAGNOSIS — L408 Other psoriasis: Secondary | ICD-10-CM | POA: Diagnosis not present

## 2019-12-26 DIAGNOSIS — L0100 Impetigo, unspecified: Secondary | ICD-10-CM | POA: Diagnosis not present

## 2019-12-27 ENCOUNTER — Other Ambulatory Visit: Payer: Self-pay

## 2019-12-28 ENCOUNTER — Encounter: Payer: Self-pay | Admitting: Internal Medicine

## 2019-12-28 ENCOUNTER — Ambulatory Visit (INDEPENDENT_AMBULATORY_CARE_PROVIDER_SITE_OTHER): Payer: Medicare Other | Admitting: Internal Medicine

## 2019-12-28 VITALS — BP 110/70 | HR 71 | Temp 98.5°F | Ht 69.0 in | Wt 218.9 lb

## 2019-12-28 DIAGNOSIS — I421 Obstructive hypertrophic cardiomyopathy: Secondary | ICD-10-CM | POA: Diagnosis not present

## 2019-12-28 DIAGNOSIS — D869 Sarcoidosis, unspecified: Secondary | ICD-10-CM

## 2019-12-28 DIAGNOSIS — E785 Hyperlipidemia, unspecified: Secondary | ICD-10-CM | POA: Diagnosis not present

## 2019-12-28 DIAGNOSIS — Z0000 Encounter for general adult medical examination without abnormal findings: Secondary | ICD-10-CM

## 2019-12-28 DIAGNOSIS — I10 Essential (primary) hypertension: Secondary | ICD-10-CM

## 2019-12-28 DIAGNOSIS — G4734 Idiopathic sleep related nonobstructive alveolar hypoventilation: Secondary | ICD-10-CM | POA: Diagnosis not present

## 2019-12-28 DIAGNOSIS — E739 Lactose intolerance, unspecified: Secondary | ICD-10-CM | POA: Diagnosis not present

## 2019-12-28 DIAGNOSIS — E538 Deficiency of other specified B group vitamins: Secondary | ICD-10-CM | POA: Diagnosis not present

## 2019-12-28 LAB — CBC WITH DIFFERENTIAL/PLATELET
Basophils Absolute: 0 10*3/uL (ref 0.0–0.1)
Basophils Relative: 1.1 % (ref 0.0–3.0)
Eosinophils Absolute: 0.1 10*3/uL (ref 0.0–0.7)
Eosinophils Relative: 4 % (ref 0.0–5.0)
HCT: 38.9 % — ABNORMAL LOW (ref 39.0–52.0)
Hemoglobin: 13.2 g/dL (ref 13.0–17.0)
Lymphocytes Relative: 32.7 % (ref 12.0–46.0)
Lymphs Abs: 0.8 10*3/uL (ref 0.7–4.0)
MCHC: 34.1 g/dL (ref 30.0–36.0)
MCV: 90.7 fl (ref 78.0–100.0)
Monocytes Absolute: 0.5 10*3/uL (ref 0.1–1.0)
Monocytes Relative: 21.2 % — ABNORMAL HIGH (ref 3.0–12.0)
Neutro Abs: 1 10*3/uL — ABNORMAL LOW (ref 1.4–7.7)
Neutrophils Relative %: 41 % — ABNORMAL LOW (ref 43.0–77.0)
Platelets: 279 10*3/uL (ref 150.0–400.0)
RBC: 4.29 Mil/uL (ref 4.22–5.81)
RDW: 14.7 % (ref 11.5–15.5)
WBC: 2.4 10*3/uL — ABNORMAL LOW (ref 4.0–10.5)

## 2019-12-28 LAB — COMPREHENSIVE METABOLIC PANEL
ALT: 14 U/L (ref 0–53)
AST: 20 U/L (ref 0–37)
Albumin: 4.3 g/dL (ref 3.5–5.2)
Alkaline Phosphatase: 40 U/L (ref 39–117)
BUN: 15 mg/dL (ref 6–23)
CO2: 27 mEq/L (ref 19–32)
Calcium: 8.9 mg/dL (ref 8.4–10.5)
Chloride: 107 mEq/L (ref 96–112)
Creatinine, Ser: 0.94 mg/dL (ref 0.40–1.50)
GFR: 100.94 mL/min (ref >60.00)
Glucose, Bld: 86 mg/dL (ref 70–99)
Potassium: 4.5 mEq/L (ref 3.5–5.1)
Sodium: 139 mEq/L (ref 135–145)
Total Bilirubin: 0.9 mg/dL (ref 0.2–1.2)
Total Protein: 6.8 g/dL (ref 6.0–8.3)

## 2019-12-28 LAB — LIPID PANEL
Cholesterol: 143 mg/dL (ref 0–200)
HDL: 49.9 mg/dL (ref >39.00)
LDL Cholesterol: 85 mg/dL (ref 0–99)
NonHDL: 92.73
Total CHOL/HDL Ratio: 3
Triglycerides: 38 mg/dL (ref 0.0–149.0)
VLDL: 7.6 mg/dL (ref 0.0–40.0)

## 2019-12-28 LAB — HEMOGLOBIN A1C: Hgb A1c MFr Bld: 4.9 % (ref 4.6–6.5)

## 2019-12-28 MED ORDER — CYANOCOBALAMIN 1000 MCG/ML IJ SOLN: 1000.0000 ug | INTRAMUSCULAR | 2 refills | Status: DC

## 2019-12-28 MED ORDER — CYANOCOBALAMIN 1000 MCG/ML IJ SOLN
1000.0000 ug | Freq: Once | INTRAMUSCULAR | Status: AC
  Administered 2019-12-28: 1000 ug via INTRAMUSCULAR

## 2019-12-28 MED ORDER — "BD SAFETYGLIDE SYRINGE/NEEDLE 25G X 1"" 3 ML MISC": 11 refills | Status: DC

## 2019-12-28 NOTE — Progress Notes (Signed)
Established Patient Office Visit     This visit occurred during the SARS-CoV-2 public health emergency.  Safety protocols were in place, including screening questions prior to the visit, additional usage of staff PPE, and extensive cleaning of exam room while observing appropriate contact time as indicated for disinfecting solutions.    CC/Reason for Visit: Subsequent Medicare wellness visit and yearly follow-up of chronic medical conditions  HPI: [name]. is a 54 y.o. male who is coming in today for the above mentioned reasons. Past Medical History is significant for: Neurosarcoidosis, follows with rheumatology, neurology and pulmonology at [facility].  Has been relatively stable recently.  Has history of hypertrophic obstructive cardiomyopathy and follows with [name], was seen recently and advised to follow-up as needed.  He has had negative cath, stress test, cardiac CT.  He tells me that he was having cardiac MRIs due to possibility of cardiac involvement with sarcoid, however these have been discontinued since he had a spinal stimulator placed in and no longer can receive MRIs.  Per his report he has never had any abnormalities on previous cardiac MRIs.  He has a history of chronic pain syndrome and sees a pain clinic and is prescribed oxycodone through them.  He is on azathioprine for his sarcoidosis, he has a history of hypertension that has been well controlled on atenolol, has a history of hyperlipidemia that has been well controlled on a statin.  Has a history of vitamin B12 deficiency and gets monthly B12 injections.  Has a history of depression with stable mood and is on Cymbalta and Wellbutrin prescribed through this office.  He has no major complaints today.  He has routine eye care, no dental care.  He has completed both Covid vaccines.  He is scheduled for a sleep study next week to update his CPAP machine.  He is wanting to receive a B12 injection in  office today.  He had a colonoscopy in 2019.   Past Medical/Surgical History: Past Medical History:  Diagnosis Date  . Allergy   . Anginal pain ([place])    s/p cardiac cath 07/19/13 showing NL coronaries, EF 50-55%  . Anxiety   . Asthma    as child  . B12 DEFICIENCY 03/06/2007  . [facility])    prostate ca  . CARDIOMYOPATHY, IDIOPATHIC HYPERTROPHIC 03/06/2007  . Clotting disorder (HCC)    knee   . DEPRESSION 03/06/2007  . DVT (deep venous thrombosis) ([place])   . Dysrhythmia    sees [name] for "palpitations"  . GERD 06/01/2007  . History of kidney stones   . HYPERCHOLESTEROLEMIA 03/06/2007  . HYPERTENSION 03/06/2007   [name]  . IMPAIRED GLUCOSE TOLERANCE 11/08/2008  . NEPHROLITHIASIS, HX OF 04/07/2010  . 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 OSTEOARTHRITIS 06/01/2007  . Palpitations 10/23/2007  . Peripheral vascular disease (HCC) 03   dvt  . Pneumonia    hx of  . Primary idiopathic hypertrophic cardiomyopathy ([place])   . Prostate cancer ([place])   . Shortness of breath    infreq  . SLEEP APNEA 03/06/2007   uses vpap, last sleep study 2011, Dr. Alanson Puls, at [facility]  . Sleep apnea    wears cpap  . SYNDROME, CHRONIC PAIN 03/06/2007    Past Surgical History:  Procedure Laterality Date  . CARDIAC CATHETERIZATION    . decompression and fusion     cervicothoracic  . EYE SURGERY Bilateral    blepharoplasty  . LEFT HEART CATHETERIZATION WITH CORONARY  ANGIOGRAM N/A 07/19/2013   Procedure: LEFT HEART CATHETERIZATION WITH CORONARY ANGIOGRAM;  Surgeon: Jolaine Artist, MD;  Location: [facility] CATH LAB;  Service: Cardiovascular;  Laterality: N/A;  . LEG SKIN LESION  BIOPSY / EXCISION     left leg  . LUMBAR FUSION    . PARTIAL NEPHRECTOMY Left    bx  . PENILE PROSTHESIS IMPLANT    . PROSTATE SURGERY     robotic prostatectomy  . SPINAL CORD STIMULATOR INSERTION N/A 04/11/2014   Procedure: LUMBAR SPINAL CORD STIMULATOR INSERTION;  Surgeon: Newman Pies, MD;  Location: [place] NEURO ORS;   Service: Neurosurgery;  Laterality: N/A;  . TONSILLECTOMY    . UPPER GASTROINTESTINAL ENDOSCOPY  2009    Social History:  reports that he quit smoking about 36 years ago. His smoking use included cigarettes. He has a 1.50 pack-year smoking history. He has never used smokeless tobacco. He reports that he does not drink alcohol or use drugs.  Allergies: Allergies  Allergen Reactions  . Adhesive [Tape] Other (See Comments)    Skin irritation  . Pollen Extract Other (See Comments)    Stuffy nose    Family History:  Family History  Problem Relation Age of Onset  . Heart disease Mother   . Kidney disease Neg Hx        family hx   . Cancer Neg Hx        family hx prostate  . Colon cancer Neg Hx   . Colon polyps Neg Hx   . Rectal cancer Neg Hx   . Stomach cancer Neg Hx      Current Outpatient Medications:  .  albuterol (PROVENTIL HFA;VENTOLIN HFA) 108 (90 Base) MCG/ACT inhaler, Inhale 2 puffs into the lungs 2 (two) times daily as needed for wheezing or shortness of breath., Disp: 18 g, Rfl: 1 .  amitriptyline (ELAVIL) 25 MG tablet, TAKE 1 TABLET (25 MG TOTAL) BY MOUTH AT BEDTIME., Disp: 30 tablet, Rfl: 5 .  atenolol (TENORMIN) 50 MG tablet, Take 1 tablet (50 mg total) by mouth daily., Disp: 90 tablet, Rfl: 3 .  atorvastatin (LIPITOR) 40 MG tablet, Take 1 tablet (40 mg total) by mouth daily., Disp: 90 tablet, Rfl: 3 .  azaTHIOprine (IMURAN) 50 MG tablet, Take 250 mg by mouth daily. , Disp: , Rfl:  .  buPROPion (WELLBUTRIN XL) 300 MG 24 hr tablet, Take 1 tablet (300 mg total) by mouth every morning., Disp: 90 tablet, Rfl: 1 .  cyanocobalamin (,VITAMIN B-12,) 1000 MCG/ML injection, Inject 1 mL (1,000 mcg total) into the muscle every 30 (thirty) days., Disp: 6 mL, Rfl: 2 .  cyclobenzaprine (FLEXERIL) 10 MG tablet, Take 1 tablet by mouth daily as needed., Disp: , Rfl:  .  cycloSPORINE (RESTASIS) 0.05 % ophthalmic emulsion, Place 2 drops into both eyes 2 (two) times daily. , Disp: , Rfl:    .  dicyclomine (BENTYL) 10 MG capsule, TAKE 1 CAPSULE BY MOUTH 4 TIMES DAILY, Disp: 360 capsule, Rfl: 0 .  DULoxetine (CYMBALTA) 30 MG capsule, Take 1 capsule (30 mg total) by mouth daily., Disp: 90 capsule, Rfl: 0 .  DULoxetine (CYMBALTA) 60 MG capsule, Take 1 capsule (60 mg total) by mouth daily., Disp: 90 capsule, Rfl: 1 .  erythromycin ophthalmic ointment, Place 1 application into both eyes at bedtime. , Disp: , Rfl:  .  gabapentin (NEURONTIN) 300 MG capsule, TAKE ONE CAPSULE BY MOUTH FOUR TIMES A DAY, Disp: 360 capsule, Rfl: 0 .  guaiFENesin (MUCINEX) 600 MG  12 hr tablet, Take 600 mg by mouth 2 (two) times daily. , Disp: , Rfl:  .  hydrocortisone (ANUSOL-HC) 2.5 % rectal cream, Place 1 application rectally 3 (three) times daily., Disp: 30 g, Rfl: 1 .  lidocaine (XYLOCAINE) 5 % ointment, Apply 1 application topically daily as needed for moderate pain. , Disp: , Rfl:  .  NEXIUM 40 MG capsule, Take 1 capsule (40 mg total) by mouth daily., Disp: 90 capsule, Rfl: 1 .  nortriptyline (PAMELOR) 25 MG capsule, Take 25 mg by mouth at bedtime., Disp: , Rfl:  .  Polyethyl Glycol-Propyl Glycol (SYSTANE PRESERVATIVE FREE OP), Place 1 drop into both eyes 2 (two) times daily., Disp: , Rfl:  .  triamcinolone (NASACORT AQ) 55 MCG/ACT AERO nasal inhaler, Place 1 spray into the nose daily as needed (dry mouth)., Disp: 1 Inhaler, Rfl: 2 .  triamcinolone ointment (KENALOG) 0.1 %, Apply 1 application topically daily. , Disp: , Rfl:  .  oxyCODONE-acetaminophen (PERCOCET) 10-325 MG tablet, Take 1 tablet by mouth See admin instructions. Take 1 tablet by mouth every morning and 1 tablet before 6pm, Disp: , Rfl:  .  SYRINGE-NEEDLE, DISP, 3 ML (BD SAFETYGLIDE SYRINGE/NEEDLE) 25G X 1" 3 ML MISC, Use for B12 injections, Disp: 100 each, Rfl: 11  Current Facility-Administered Medications:  .  0.9 %  sodium chloride infusion, 500 mL, Intravenous, Once, Danis, Estill Cotta III, MD  Review of Systems:  Constitutional: Denies  fever, chills, diaphoresis, appetite change and fatigue.  HEENT: Denies photophobia, eye pain, redness, hearing loss, ear pain, congestion, sore throat, rhinorrhea, sneezing, mouth sores, trouble swallowing, neck pain, neck stiffness and tinnitus.   Respiratory: Denies SOB, DOE, cough, chest tightness,  and wheezing.   Cardiovascular: Denies chest pain, palpitations and leg swelling.  Gastrointestinal: Denies nausea, vomiting, abdominal pain, diarrhea, constipation, blood in stool and abdominal distention.  Genitourinary: Denies dysuria, urgency, frequency, hematuria, flank pain and difficulty urinating.  Endocrine: Denies: hot or cold intolerance, sweats, changes in hair or nails, polyuria, polydipsia. Musculoskeletal: Denies myalgias, back pain, joint swelling, arthralgias and gait problem.  Skin: Denies pallor, rash and wound.  Neurological: Denies dizziness, seizures, syncope, weakness, light-headedness, numbness and headaches.  Hematological: Denies adenopathy. Easy bruising, personal or family bleeding history  Psychiatric/Behavioral: Denies suicidal ideation, mood changes, confusion, nervousness, sleep disturbance and agitation    Physical Exam: Vitals:   12/28/19 0833  BP: 110/70  Pulse: 71  Temp: 98.5 F (36.9 C)  TempSrc: Temporal  SpO2: 97%  Weight: 218 lb 14.4 oz (99.3 kg)  Height: 5\' 9"  (1.753 m)    Body mass index is 32.33 kg/m.   Constitutional: NAD, calm, comfortable Eyes: PERRL, lids and conjunctivae normal ENMT: Mucous membranes are moist. Tympanic membrane is pearly white, no erythema or bulging. Neck: normal, supple, no masses, no thyromegaly Respiratory: clear to auscultation bilaterally, no wheezing, no crackles. Normal respiratory effort. No accessory muscle use.  Cardiovascular: Regular rate and rhythm, no murmurs / rubs / gallops. No extremity edema.  Abdomen: no tenderness, no masses palpated. No hepatosplenomegaly. Bowel sounds positive.    Musculoskeletal: no clubbing / cyanosis. No joint deformity upper and lower extremities. Good ROM, no contractures. Normal muscle tone.  Skin: no rashes, lesions, ulcers. No induration Neurologic: CN 2-12 grossly intact. Sensation intact, DTR normal. Strength 5/5 in all 4.  Psychiatric: Normal judgment and insight. Alert and oriented x 3. Normal mood.    Subsequent Medicare wellness visit   1. Risk factors, based on past  M,S,F -  cardiovascular disease risk factors include gender, hyperlipidemia, hypertension and history of obstructive cardiomyopathy   2.  Physical activities: He does not do much physical activity other than what he does through his job   3.  Depression/mood:  Stable, not depressed   4.  Hearing:  No perceived issues   5.  ADL's: Independent in all ADLs   6.  Fall risk:  Low fall risk   7.  Home safety: No problems identified   8.  Height weight, and visual acuity: Height and weight as above, visual acuity is 20/30 with the right eye, he is not able to see well with the left eye and 20/25 with eyes together.   9.  Counseling:  We discussed healthy lifestyle in detail   10. Lab orders based on risk factors: Laboratory update will be reviewed   11. Referral :  None today   12. Care plan:  Follow-up in 6 months   13. Cognitive assessment:  No cognitive impairment   14. Screening: Patient provided with a written and personalized 5-10 year screening schedule in the AVS.   yes   15. Provider List Update:   PCP and multiple specialists through [facility]  16. Advance Directives: Full code     Office Visit from 12/28/2019 in [place] at [place]  PHQ-9 Total Score  2      Fall Risk  12/28/2019 09/27/2018 08/26/2017  Falls in the past year? 0 0 No  Number falls in past yr: 0 - -  Injury with Fall? 0 - -     Impression and Plan:  Encounter for preventive health examination -He has routine eye care, have advised routine dental care. -His  immunizations are up-to-date and age-appropriate including Covid vaccine x2. -Screening labs today. -Healthy lifestyle has been discussed in detail. -PSA today for prostate cancer screening. -He had a colonoscopy in 2019 and is a 5-year callback.  B12 deficiency  -IM injection in office today and supplies sent home to administer subsequent months at home.  Essential hypertension  -Well-controlled on current medication.  Obstructive sleep apnea -Scheduled for sleep study next week to update CPAP machine.  Hypertrophic obstructive cardiomyopathy ([place]) -Stable, followed by cardiology.  Sarcoid ([place]) -Followed through specialist team at [facility].  Dyslipidemia  - Plan: Lipid panel -Last LDL was 75 in March 2020.  IMPAIRED GLUCOSE TOLERANCE  - Plan: Hemoglobin A1c    Patient Instructions  -Nice seeing you today!!  -Lab work today; will notify you once results are available.  -Remember to schedule your dental appointment.     Lelon Frohlich, MD [facility]

## 2019-12-28 NOTE — Patient Instructions (Signed)
-  Nice seeing you today!!  -Lab work today; will notify you once results are available.  -Remember to schedule your dental appointment.

## 2020-01-04 DIAGNOSIS — G4733 Obstructive sleep apnea (adult) (pediatric): Secondary | ICD-10-CM | POA: Diagnosis not present

## 2020-01-07 DIAGNOSIS — G4733 Obstructive sleep apnea (adult) (pediatric): Secondary | ICD-10-CM | POA: Diagnosis not present

## 2020-01-31 DIAGNOSIS — R519 Headache, unspecified: Secondary | ICD-10-CM | POA: Diagnosis not present

## 2020-01-31 DIAGNOSIS — Z79899 Other long term (current) drug therapy: Secondary | ICD-10-CM | POA: Diagnosis not present

## 2020-01-31 DIAGNOSIS — M8949 Other hypertrophic osteoarthropathy, multiple sites: Secondary | ICD-10-CM | POA: Diagnosis not present

## 2020-01-31 DIAGNOSIS — D869 Sarcoidosis, unspecified: Secondary | ICD-10-CM | POA: Diagnosis not present

## 2020-01-31 DIAGNOSIS — R21 Rash and other nonspecific skin eruption: Secondary | ICD-10-CM | POA: Diagnosis not present

## 2020-02-11 DIAGNOSIS — M47812 Spondylosis without myelopathy or radiculopathy, cervical region: Secondary | ICD-10-CM | POA: Diagnosis not present

## 2020-02-11 DIAGNOSIS — M47816 Spondylosis without myelopathy or radiculopathy, lumbar region: Secondary | ICD-10-CM | POA: Diagnosis not present

## 2020-02-11 DIAGNOSIS — M542 Cervicalgia: Secondary | ICD-10-CM | POA: Diagnosis not present

## 2020-02-11 DIAGNOSIS — G4733 Obstructive sleep apnea (adult) (pediatric): Secondary | ICD-10-CM | POA: Diagnosis not present

## 2020-02-11 DIAGNOSIS — G8929 Other chronic pain: Secondary | ICD-10-CM | POA: Diagnosis not present

## 2020-02-21 DIAGNOSIS — M47812 Spondylosis without myelopathy or radiculopathy, cervical region: Secondary | ICD-10-CM | POA: Diagnosis not present

## 2020-02-25 DIAGNOSIS — G4733 Obstructive sleep apnea (adult) (pediatric): Secondary | ICD-10-CM | POA: Diagnosis not present

## 2020-02-26 DIAGNOSIS — G4733 Obstructive sleep apnea (adult) (pediatric): Secondary | ICD-10-CM | POA: Diagnosis not present

## 2020-02-27 ENCOUNTER — Telehealth: Payer: Self-pay

## 2020-02-27 ENCOUNTER — Telehealth: Payer: Medicare Other | Admitting: Family Medicine

## 2020-02-27 ENCOUNTER — Telehealth: Payer: Self-pay | Admitting: *Deleted

## 2020-02-27 NOTE — Telephone Encounter (Signed)
Virtual visit scheduled with Dr Volanda Napoleon 02/27/20 at 4:30 pm

## 2020-02-27 NOTE — Telephone Encounter (Signed)
Patient spoke with nurse triage 02/26/2020 at 5:30 PM. Patient reports he got bit by something. Noticed bites Monday and they are itching. He has four spots in various locations that are red and several have pus. He has tried otc Neosporin and Aloe. It was recommended for patient to see PCP within 3 days

## 2020-02-27 NOTE — Telephone Encounter (Signed)
Pt had trouble connecting to MyChart video visit, pt was offered an office visit with one of the Dr at the office but state that he is not available to come to the office, pt state that the insect bite site was healing and if he feels like he need to  See a Doctor he will call our office to schedule or go to [facility]

## 2020-03-07 DIAGNOSIS — M5136 Other intervertebral disc degeneration, lumbar region: Secondary | ICD-10-CM | POA: Diagnosis not present

## 2020-03-07 DIAGNOSIS — M47816 Spondylosis without myelopathy or radiculopathy, lumbar region: Secondary | ICD-10-CM | POA: Diagnosis not present

## 2020-03-07 DIAGNOSIS — Z4542 Encounter for adjustment and management of neuropacemaker (brain) (peripheral nerve) (spinal cord): Secondary | ICD-10-CM | POA: Diagnosis not present

## 2020-03-13 DIAGNOSIS — H04123 Dry eye syndrome of bilateral lacrimal glands: Secondary | ICD-10-CM | POA: Diagnosis not present

## 2020-03-13 DIAGNOSIS — D869 Sarcoidosis, unspecified: Secondary | ICD-10-CM | POA: Diagnosis not present

## 2020-03-13 DIAGNOSIS — Z888 Allergy status to other drugs, medicaments and biological substances status: Secondary | ICD-10-CM | POA: Diagnosis not present

## 2020-03-13 DIAGNOSIS — T85193A Other mechanical complication of implanted electronic neurostimulator, generator, initial encounter: Secondary | ICD-10-CM | POA: Diagnosis not present

## 2020-03-13 DIAGNOSIS — Z6831 Body mass index (BMI) 31.0-31.9, adult: Secondary | ICD-10-CM | POA: Diagnosis not present

## 2020-03-13 DIAGNOSIS — Z9079 Acquired absence of other genital organ(s): Secondary | ICD-10-CM | POA: Diagnosis not present

## 2020-03-13 DIAGNOSIS — Z8546 Personal history of malignant neoplasm of prostate: Secondary | ICD-10-CM | POA: Diagnosis not present

## 2020-03-13 DIAGNOSIS — Z87891 Personal history of nicotine dependence: Secondary | ICD-10-CM | POA: Diagnosis not present

## 2020-03-13 DIAGNOSIS — E669 Obesity, unspecified: Secondary | ICD-10-CM | POA: Diagnosis not present

## 2020-03-13 DIAGNOSIS — Z905 Acquired absence of kidney: Secondary | ICD-10-CM | POA: Diagnosis not present

## 2020-03-13 DIAGNOSIS — G4730 Sleep apnea, unspecified: Secondary | ICD-10-CM | POA: Diagnosis not present

## 2020-03-13 DIAGNOSIS — Z79899 Other long term (current) drug therapy: Secondary | ICD-10-CM | POA: Diagnosis not present

## 2020-03-13 DIAGNOSIS — I10 Essential (primary) hypertension: Secondary | ICD-10-CM | POA: Diagnosis not present

## 2020-03-13 DIAGNOSIS — Z4542 Encounter for adjustment and management of neuropacemaker (brain) (peripheral nerve) (spinal cord): Secondary | ICD-10-CM | POA: Diagnosis not present

## 2020-03-13 DIAGNOSIS — Z981 Arthrodesis status: Secondary | ICD-10-CM | POA: Diagnosis not present

## 2020-03-13 DIAGNOSIS — E538 Deficiency of other specified B group vitamins: Secondary | ICD-10-CM | POA: Diagnosis not present

## 2020-03-13 DIAGNOSIS — G894 Chronic pain syndrome: Secondary | ICD-10-CM | POA: Diagnosis not present

## 2020-04-08 DIAGNOSIS — Z09 Encounter for follow-up examination after completed treatment for conditions other than malignant neoplasm: Secondary | ICD-10-CM | POA: Diagnosis not present

## 2020-04-08 DIAGNOSIS — M47816 Spondylosis without myelopathy or radiculopathy, lumbar region: Secondary | ICD-10-CM | POA: Diagnosis not present

## 2020-04-08 DIAGNOSIS — Z4542 Encounter for adjustment and management of neuropacemaker (brain) (peripheral nerve) (spinal cord): Secondary | ICD-10-CM | POA: Diagnosis not present

## 2020-04-10 ENCOUNTER — Telehealth: Payer: Self-pay | Admitting: Cardiology

## 2020-04-10 NOTE — Telephone Encounter (Signed)
Pt c/o of Chest Pain: STAT if CP now or developed within 24 hours  1. Are you having CP right now? No, however he states he is experiencing continuous pain in his neck   2. Are you experiencing any other symptoms (ex. SOB, nausea, vomiting, sweating)? Headache  3. How long have you been experiencing CP? 1 week  4. Is your CP continuous or coming and going? Coming and going  5. Have you taken Nitroglycerin? No      ?

## 2020-04-10 NOTE — Telephone Encounter (Signed)
Called patient back about message. Patient complained about having heaviness and tightness in his chest that comes and goes, continues neck pain, and low HR 50's to 60's. Patient stated that his chest and neck pain started about a week ago. Patient was seen back in 2019 with a CT result with 0 calcium score. Informed patient that he needs to be evaluated. Will send message to office at [place] and Dr. Jacalyn Lefevre nurse to see if they can find an appointment for him. Encouraged patient in the mean time to call his PCP to see if he can get in with them. If not he should go to urgent care or ED.

## 2020-04-28 DIAGNOSIS — Z09 Encounter for follow-up examination after completed treatment for conditions other than malignant neoplasm: Secondary | ICD-10-CM | POA: Diagnosis not present

## 2020-04-28 DIAGNOSIS — M47816 Spondylosis without myelopathy or radiculopathy, lumbar region: Secondary | ICD-10-CM | POA: Diagnosis not present

## 2020-04-28 DIAGNOSIS — Z4542 Encounter for adjustment and management of neuropacemaker (brain) (peripheral nerve) (spinal cord): Secondary | ICD-10-CM | POA: Diagnosis not present

## 2020-05-01 NOTE — Telephone Encounter (Signed)
Spoke with pt, he continues to have off and on chest pain. Follow up scheduled

## 2020-05-07 ENCOUNTER — Encounter: Payer: Self-pay | Admitting: Cardiology

## 2020-05-07 NOTE — Progress Notes (Signed)
HPI: Follow-up chest pain.  Cardiac MRI January 2012 showed normal LV function, mild left atrial enlargement and no infiltrative process.  Patient had cardiac catheterization in 2014 that showed normal coronary arteries and normal LV function.  Last echocardiogram April 2017 showed normal LV function, moderate focal basal septal hypertrophy, grade 1 diastolic dysfunction, chordal Sam.  Monitor May 2017 showed sinus with PVCs.  Cardiac CTA October 2019 showed calcium score of 0 and no coronary disease; prominent lymph nodes noted felt likely reactive; follow-up chest CT could be considered in 6 months.  Since last seen he complains of chest pain. It is substernal without radiation. It is not exertional, pleuritic or related to food. Last several minutes to 30 minutes. Resolved spontaneously. Some dyspnea but no nausea or diaphoresis. He also has some neck pain that has been continuous for 1 month.  Current Outpatient Medications  Medication Sig Dispense Refill  . albuterol (PROVENTIL HFA;VENTOLIN HFA) 108 (90 Base) MCG/ACT inhaler Inhale 2 puffs into the lungs 2 (two) times daily as needed for wheezing or shortness of breath. 18 g 1  . amitriptyline (ELAVIL) 25 MG tablet TAKE 1 TABLET (25 MG TOTAL) BY MOUTH AT BEDTIME. 30 tablet 5  . atenolol (TENORMIN) 50 MG tablet Take 1 tablet (50 mg total) by mouth daily. 90 tablet 3  . atorvastatin (LIPITOR) 40 MG tablet Take 1 tablet (40 mg total) by mouth daily. 90 tablet 3  . azaTHIOprine (IMURAN) 50 MG tablet Take 250 mg by mouth daily.     Marland Kitchen buPROPion (WELLBUTRIN XL) 300 MG 24 hr tablet Take 1 tablet (300 mg total) by mouth every morning. 90 tablet 1  . cyanocobalamin (,VITAMIN B-12,) 1000 MCG/ML injection Inject 1 mL (1,000 mcg total) into the muscle every 30 (thirty) days. 6 mL 2  . cyclobenzaprine (FLEXERIL) 10 MG tablet Take 1 tablet by mouth daily as needed.    . cycloSPORINE (RESTASIS) 0.05 % ophthalmic emulsion Place 2 drops into both eyes 2  (two) times daily.     Marland Kitchen dicyclomine (BENTYL) 10 MG capsule TAKE 1 CAPSULE BY MOUTH 4 TIMES DAILY 360 capsule 0  . DULoxetine (CYMBALTA) 30 MG capsule Take 1 capsule (30 mg total) by mouth daily. 90 capsule 0  . DULoxetine (CYMBALTA) 60 MG capsule Take 1 capsule (60 mg total) by mouth daily. 90 capsule 1  . erythromycin ophthalmic ointment Place 1 application into both eyes at bedtime.     . gabapentin (NEURONTIN) 300 MG capsule TAKE ONE CAPSULE BY MOUTH FOUR TIMES A DAY 360 capsule 0  . guaiFENesin (MUCINEX) 600 MG 12 hr tablet Take 600 mg by mouth 2 (two) times daily.     . hydrocortisone (ANUSOL-HC) 2.5 % rectal cream Place 1 application rectally 3 (three) times daily. 30 g 1  . lidocaine (XYLOCAINE) 5 % ointment Apply 1 application topically daily as needed for moderate pain.     Marland Kitchen NEXIUM 40 MG capsule Take 1 capsule (40 mg total) by mouth daily. 90 capsule 1  . nortriptyline (PAMELOR) 25 MG capsule Take 25 mg by mouth at bedtime.    Marland Kitchen oxyCODONE-acetaminophen (PERCOCET) 10-325 MG tablet Take 1 tablet by mouth See admin instructions. Take 1 tablet by mouth every morning and 1 tablet before 6pm    . Polyethyl Glycol-Propyl Glycol (SYSTANE PRESERVATIVE FREE OP) Place 1 drop into both eyes 2 (two) times daily.    . SYRINGE-NEEDLE, DISP, 3 ML (BD SAFETYGLIDE SYRINGE/NEEDLE) 25G X 1" 3 ML  MISC Use for B12 injections 100 each 11  . triamcinolone (NASACORT AQ) 55 MCG/ACT AERO nasal inhaler Place 1 spray into the nose daily as needed (dry mouth). 1 Inhaler 2  . triamcinolone ointment (KENALOG) 0.1 % Apply 1 application topically daily.      Current Facility-Administered Medications  Medication Dose Route Frequency Provider Last Rate Last Admin  . 0.9 %  sodium chloride infusion  500 mL Intravenous Once Doran Stabler, MD         Past Medical History:  Diagnosis Date  . Allergy   . Anginal pain ([place])    s/p cardiac cath 07/19/13 showing NL coronaries, EF 50-55%  . Anxiety   . Asthma    as  child  . B12 DEFICIENCY 03/06/2007  . Cancer [facility])    prostate ca  . CARDIOMYOPATHY, IDIOPATHIC HYPERTROPHIC 03/06/2007  . Clotting disorder (HCC)    knee   . DEPRESSION 03/06/2007  . DVT (deep venous thrombosis) ([place])   . Dysrhythmia    sees [name] for "palpitations"  . GERD 06/01/2007  . History of kidney stones   . HYPERCHOLESTEROLEMIA 03/06/2007  . HYPERTENSION 03/06/2007   [name]  . IMPAIRED GLUCOSE TOLERANCE 11/08/2008  . NEPHROLITHIASIS, HX OF 04/07/2010  . 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 OSTEOARTHRITIS 06/01/2007  . Palpitations 10/23/2007  . Peripheral vascular disease (HCC) 03   dvt  . Pneumonia    hx of  . Primary idiopathic hypertrophic cardiomyopathy ([place])   . Prostate cancer ([place])   . SLEEP APNEA 03/06/2007   uses vpap, last sleep study 2011, Dr. Alanson Puls, at [facility], CHRONIC PAIN 03/06/2007    Past Surgical History:  Procedure Laterality Date  . CARDIAC CATHETERIZATION    . decompression and fusion     cervicothoracic  . EYE SURGERY Bilateral    blepharoplasty  . LEFT HEART CATHETERIZATION WITH CORONARY ANGIOGRAM N/A 07/19/2013   Procedure: LEFT HEART CATHETERIZATION WITH CORONARY ANGIOGRAM;  Surgeon: Jolaine Artist, MD;  Location: [facility] CATH LAB;  Service: Cardiovascular;  Laterality: N/A;  . LEG SKIN LESION  BIOPSY / EXCISION     left leg  . LUMBAR FUSION    . PARTIAL NEPHRECTOMY Left    bx  . PENILE PROSTHESIS IMPLANT    . PROSTATE SURGERY     robotic prostatectomy  . SPINAL CORD STIMULATOR INSERTION N/A 04/11/2014   Procedure: LUMBAR SPINAL CORD STIMULATOR INSERTION;  Surgeon: Newman Pies, MD;  Location: [facility] NEURO ORS;  Service: Neurosurgery;  Laterality: N/A;  . TONSILLECTOMY    . UPPER GASTROINTESTINAL ENDOSCOPY  2009    Social History   Socioeconomic History  . Marital status: Divorced    Spouse name: Not on file  . Number of children: Not on file  . Years of education: Not on file  . Highest education level: Not on  file  Occupational History  . Occupation: Funeral home  Tobacco Use  . Smoking status: Former Smoker    Packs/day: 0.50    Years: 3.00    Pack years: 1.50    Types: Cigarettes    Quit date: 09/07/1983    Years since quitting: 36.6  . Smokeless tobacco: Never Used  . Tobacco comment: brief history   Substance and Sexual Activity  . Alcohol use: No    Comment: occ  . Drug use: No  . Sexual activity: Yes  Other Topics Concern  . Not on file  Social History Narrative   Recently divorced, lives  alone, negotiating with wife currently regarding having son live with him, high stress   Has three children, two local, one in [employer].   Enjoys swimming, wants to get back into swimming routine   Finished schooling for funeral services, currently finishing up apprenticeship.    Social Determinants of Health   Financial Resource Strain:   . Difficulty of Paying Living Expenses: Not on file  Food Insecurity:   . Worried About Charity fundraiser in the Last Year: Not on file  . Ran Out of Food in the Last Year: Not on file  Transportation Needs:   . Lack of Transportation (Medical): Not on file  . Lack of Transportation (Non-Medical): Not on file  Physical Activity:   . Days of Exercise per Week: Not on file  . Minutes of Exercise per Session: Not on file  Stress:   . Feeling of Stress : Not on file  Social Connections:   . Frequency of Communication with Friends and Family: Not on file  . Frequency of Social Gatherings with Friends and Family: Not on file  . Attends Religious Services: Not on file  . Active Member of Clubs or Organizations: Not on file  . Attends Archivist Meetings: Not on file  . Marital Status: Not on file  Intimate Partner Violence:   . Fear of Current or Ex-Partner: Not on file  . Emotionally Abused: Not on file  . Physically Abused: Not on file  . Sexually Abused: Not on file    Family History  Problem Relation Age of Onset  . Heart disease  Mother   . Kidney disease Neg Hx        family hx   . Cancer Neg Hx        family hx prostate  . Colon cancer Neg Hx   . Colon polyps Neg Hx   . Rectal cancer Neg Hx   . Stomach cancer Neg Hx     ROS: no fevers or chills, productive cough, hemoptysis, dysphasia, odynophagia, melena, hematochezia, dysuria, hematuria, rash, seizure activity, orthopnea, PND, pedal edema, claudication. Remaining systems are negative.  Physical Exam: Well-developed well-nourished in no acute distress.  Skin is warm and dry.  HEENT is normal.  Neck is supple.  Chest is clear to auscultation with normal expansion.  Cardiovascular exam is regular rate and rhythm.  Abdominal exam nontender or distended. No masses palpated. Extremities show no edema. neuro grossly intact  ECG-sinus bradycardia at a rate of 55, cannot rule out septal infarct, no ST changes; no change compared to 06/06/2018.  Personally reviewed  A/P  1 chest pain-symptoms are atypical.  Electrocardiogram shows no new ST changes.  Previous cardiac CTA showed no coronary disease.  We will not pursue further ischemia evaluation at this point.  2 question history of possible hypertrophic cardiomyopathy on echocardiogram-we will repeat study.  3 palpitations-plan to continue beta-blocker at present dose.  4 hypertension-patient's blood pressure is controlled.  Continue present medications and follow.  5 hyperlipidemia-Per primary care.  6 question enlarged lymph nodes on previous CT-we will repeat CT of chest with contrast.  Kirk Ruths, MD

## 2020-05-08 ENCOUNTER — Ambulatory Visit (INDEPENDENT_AMBULATORY_CARE_PROVIDER_SITE_OTHER): Payer: Medicare Other | Admitting: Cardiology

## 2020-05-08 ENCOUNTER — Other Ambulatory Visit: Payer: Self-pay

## 2020-05-08 ENCOUNTER — Encounter: Payer: Self-pay | Admitting: Cardiology

## 2020-05-08 VITALS — BP 120/82 | HR 55 | Ht 69.0 in | Wt 219.4 lb

## 2020-05-08 DIAGNOSIS — E7800 Pure hypercholesterolemia, unspecified: Secondary | ICD-10-CM

## 2020-05-08 DIAGNOSIS — R079 Chest pain, unspecified: Secondary | ICD-10-CM

## 2020-05-08 DIAGNOSIS — R9389 Abnormal findings on diagnostic imaging of other specified body structures: Secondary | ICD-10-CM | POA: Diagnosis not present

## 2020-05-08 DIAGNOSIS — I10 Essential (primary) hypertension: Secondary | ICD-10-CM

## 2020-05-08 DIAGNOSIS — R072 Precordial pain: Secondary | ICD-10-CM | POA: Diagnosis not present

## 2020-05-08 NOTE — Patient Instructions (Signed)
Medication Instructions:  NO CHANGE *If you need a refill on your cardiac medications before your next appointment, please call your pharmacy*  Testing/Procedures:  Your physician has requested that you have an echocardiogram. Echocardiography is a painless test that uses sound waves to create images of your heart. It provides your doctor with information about the size and shape of your heart and how well your hearts chambers and valves are working. This procedure takes approximately one hour. There are no restrictions for this procedure.[place]  CT OF THE CHEST WITH CONTRAST TO FOLLOW UP PREVIOUS ABNORMAL CT=[address]=[facility]     Follow-Up: At [facility], you and your health needs are our priority.  As part of our continuing mission to provide you with exceptional heart care, we have created designated Provider Care Teams.  These Care Teams include your primary Cardiologist (physician) and Advanced Practice Providers (APPs -  Physician Assistants and Nurse Practitioners) who all work together to provide you with the care you need, when you need it.  We recommend signing up for the patient portal called "MyChart".  Sign up information is provided on this After Visit Summary.  MyChart is used to connect with patients for Virtual Visits (Telemedicine).  Patients are able to view lab/test results, encounter notes, upcoming appointments, etc.  Non-urgent messages can be sent to your provider as well.   To learn more about what you can do with MyChart, go to [email or web address].    Your next appointment:    Your physician recommends that you schedule a follow-up appointment in: [place] physician wants you to follow-up in: [place] will receive a reminder letter in the mail two months in advance. If you don't receive a letter, please call our office to schedule the follow-up appointment.

## 2020-05-20 ENCOUNTER — Other Ambulatory Visit: Payer: Self-pay

## 2020-05-20 ENCOUNTER — Ambulatory Visit
Admission: RE | Admit: 2020-05-20 | Discharge: 2020-05-20 | Disposition: A | Discharge status: Home / Self Care | Payer: Medicare Other | Source: Ambulatory Visit | Attending: Cardiology | Admitting: Cardiology

## 2020-05-20 DIAGNOSIS — R918 Other nonspecific abnormal finding of lung field: Secondary | ICD-10-CM | POA: Diagnosis not present

## 2020-05-20 DIAGNOSIS — J9811 Atelectasis: Secondary | ICD-10-CM | POA: Diagnosis not present

## 2020-05-20 DIAGNOSIS — R9389 Abnormal findings on diagnostic imaging of other specified body structures: Secondary | ICD-10-CM

## 2020-05-20 DIAGNOSIS — R590 Localized enlarged lymph nodes: Secondary | ICD-10-CM | POA: Diagnosis not present

## 2020-05-20 DIAGNOSIS — M47814 Spondylosis without myelopathy or radiculopathy, thoracic region: Secondary | ICD-10-CM | POA: Diagnosis not present

## 2020-05-20 MED ORDER — IOPAMIDOL (ISOVUE-300) INJECTION 61%
75.0000 mL | Freq: Once | INTRAVENOUS | Status: AC | PRN
  Administered 2020-05-20: 75 mL via INTRAVENOUS

## 2020-05-26 ENCOUNTER — Other Ambulatory Visit: Payer: Self-pay

## 2020-05-26 ENCOUNTER — Ambulatory Visit (HOSPITAL_COMMUNITY): Payer: Medicare Other | Attending: Cardiology

## 2020-05-26 DIAGNOSIS — R072 Precordial pain: Secondary | ICD-10-CM | POA: Insufficient documentation

## 2020-05-26 LAB — ECHOCARDIOGRAM COMPLETE
Area-P 1/2: 3.47 cm2
S' Lateral: 2.2 cm

## 2020-06-13 DIAGNOSIS — M47812 Spondylosis without myelopathy or radiculopathy, cervical region: Secondary | ICD-10-CM | POA: Diagnosis not present

## 2020-06-13 DIAGNOSIS — G4733 Obstructive sleep apnea (adult) (pediatric): Secondary | ICD-10-CM | POA: Diagnosis not present

## 2020-06-13 DIAGNOSIS — G894 Chronic pain syndrome: Secondary | ICD-10-CM | POA: Diagnosis not present

## 2020-06-13 DIAGNOSIS — Z9682 Presence of neurostimulator: Secondary | ICD-10-CM | POA: Diagnosis not present

## 2020-07-17 DIAGNOSIS — M47812 Spondylosis without myelopathy or radiculopathy, cervical region: Secondary | ICD-10-CM | POA: Diagnosis not present

## 2020-07-24 DIAGNOSIS — D869 Sarcoidosis, unspecified: Secondary | ICD-10-CM | POA: Diagnosis not present

## 2020-07-24 DIAGNOSIS — Z79899 Other long term (current) drug therapy: Secondary | ICD-10-CM | POA: Diagnosis not present

## 2020-07-24 DIAGNOSIS — Z23 Encounter for immunization: Secondary | ICD-10-CM | POA: Diagnosis not present

## 2020-07-24 DIAGNOSIS — D8689 Sarcoidosis of other sites: Secondary | ICD-10-CM | POA: Diagnosis not present

## 2020-07-24 DIAGNOSIS — M8949 Other hypertrophic osteoarthropathy, multiple sites: Secondary | ICD-10-CM | POA: Diagnosis not present

## 2020-08-26 ENCOUNTER — Ambulatory Visit: Payer: Medicare Other | Attending: Internal Medicine

## 2020-08-26 DIAGNOSIS — M47812 Spondylosis without myelopathy or radiculopathy, cervical region: Secondary | ICD-10-CM | POA: Diagnosis not present

## 2020-08-26 DIAGNOSIS — Z23 Encounter for immunization: Secondary | ICD-10-CM

## 2020-08-26 NOTE — Progress Notes (Signed)
   [id number]  Name:  [name].    MRN: [id number] DOB: 08/03/1966  08/26/2020  [name] was observed post Covid-19 immunization for 15 minutes without incident. He was provided with Vaccine Information Sheet and instruction to access the V-Safe system.   [name] was instructed to call 911 with any severe reactions post vaccine: Marland Kitchen Difficulty breathing  . Swelling of face and throat  . A fast heartbeat  . A bad rash all over body  . Dizziness and weakness   Immunizations Administered    Name Date Dose VIS Date Route   Pfizer COVID-19 Vaccine 08/26/2020  4:21 PM 0.3 mL 06/25/2020 Intramuscular   Manufacturer: [place]   Lot: [id number]   [place]: [id number]

## 2020-09-19 ENCOUNTER — Encounter: Payer: Self-pay | Admitting: Family Medicine

## 2020-09-19 ENCOUNTER — Other Ambulatory Visit: Payer: Self-pay

## 2020-09-19 ENCOUNTER — Ambulatory Visit (INDEPENDENT_AMBULATORY_CARE_PROVIDER_SITE_OTHER): Payer: Medicare Other | Admitting: Family Medicine

## 2020-09-19 VITALS — BP 120/82 | HR 76 | Temp 98.3°F | Wt 217.8 lb

## 2020-09-19 DIAGNOSIS — I10 Essential (primary) hypertension: Secondary | ICD-10-CM

## 2020-09-19 DIAGNOSIS — H9202 Otalgia, left ear: Secondary | ICD-10-CM

## 2020-09-19 DIAGNOSIS — H6012 Cellulitis of left external ear: Secondary | ICD-10-CM | POA: Diagnosis not present

## 2020-09-19 DIAGNOSIS — R600 Localized edema: Secondary | ICD-10-CM | POA: Diagnosis not present

## 2020-09-19 MED ORDER — AMOXICILLIN 500 MG PO TABS: 500.0000 mg | ORAL_TABLET | Freq: Two times a day (BID) | ORAL | 0 refills | Status: AC

## 2020-09-19 MED ORDER — ATENOLOL 50 MG PO TABS: 50.0000 mg | ORAL_TABLET | Freq: Every day | ORAL | 0 refills | Status: DC

## 2020-09-19 MED ORDER — FLUTICASONE PROPIONATE 50 MCG/ACT NA SUSP: 1.0000 | Freq: Every day | NASAL | 0 refills | Status: AC

## 2020-09-19 NOTE — Progress Notes (Signed)
Subjective:    Patient ID: John Carol., male    DOB: 06-30-1966, 55 y.o.   MRN: [id number]  No chief complaint on file.   HPI Patient is a 55 yo male with pmh sig for angina, a neurosarcoidosis, OFV, CAD, OSA, GERD, OA, prostate cancer, HTN, hypertrophic cardiomyopathy followed by Dr. Jerilee Hoh who was seen for acute concern.  Patient endorses left ear and jaw pain x3 days. Felt a small bump forming at opening of L ear canal.  Endorses sensation of "water in his ear".  Denies neck pain,  tooth pain.  Has not taken anything for sx.  Pt requesting refill on atenolol.  Pt mentions vision seems slightly blurry with glasses on.  Last eye exam 1 yr ago.  Having some tension HAs.  Not taking anything for them.  Past Medical History:  Diagnosis Date  . Allergy   . Anginal pain ([place])    s/p cardiac cath 07/19/13 showing NL coronaries, EF 50-55%  . Anxiety   . Asthma    as child  . B12 DEFICIENCY 03/06/2007  . Cancer [facility])    prostate ca  . CARDIOMYOPATHY, IDIOPATHIC HYPERTROPHIC 03/06/2007  . Clotting disorder (HCC)    knee   . DEPRESSION 03/06/2007  . DVT (deep venous thrombosis) ([place])   . Dysrhythmia    sees [name] for "palpitations"  . GERD 06/01/2007  . History of kidney stones   . HYPERCHOLESTEROLEMIA 03/06/2007  . HYPERTENSION 03/06/2007   [name]  . IMPAIRED GLUCOSE TOLERANCE 11/08/2008  . NEPHROLITHIASIS, HX OF 04/07/2010  . 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 OSTEOARTHRITIS 06/01/2007  . Palpitations 10/23/2007  . Peripheral vascular disease (HCC) 03   dvt  . Pneumonia    hx of  . Primary idiopathic hypertrophic cardiomyopathy ([place])   . Prostate cancer ([place])   . SLEEP APNEA 03/06/2007   uses vpap, last sleep study 2011, Dr. Alanson Puls, at [facility]  . SYNDROME, CHRONIC PAIN 03/06/2007    Allergies  Allergen Reactions  . Adhesive [Tape] Other (See Comments)    Skin irritation  . Pollen Extract Other (See Comments)    Stuffy nose    ROS General: Denies fever,  chills, night sweats, changes in weight, changes in appetite HEENT: Denies headaches, ear pain, changes in vision, rhinorrhea, sore throat +L ear/jaw pain, bump in L ear, "water in ear" CV: Denies CP, palpitations, SOB, orthopnea Pulm: Denies SOB, cough, wheezing GI: Denies abdominal pain, nausea, vomiting, diarrhea, constipation GU: Denies dysuria, hematuria, frequency Msk: Denies muscle cramps, joint pains Neuro: Denies weakness, numbness, tingling Skin: Denies rashes, bruising Psych: Denies depression, anxiety, hallucinations     Objective:    Blood pressure 120/82, pulse 76, temperature 98.3 F (36.8 C), temperature source Oral, weight 217 lb 12.8 oz (98.8 kg), SpO2 98 %.  Gen. Pleasant, well-nourished, in no distress, normal affect   HEENT: /AT, face symmetric, conjunctiva clear, no scleral icterus, PERRLA, EOMI, nares patent without drainage, no gingival edema or erythema, no tooth decay.  No TTP of face or sinuses. Pharynx without erythema or exudate.  R external ear, canal, and TM normal.  Left external ear normal.  Small pustule opening of left canal, erythema within, and TM full. Mild edema of L lateral face.  No cervical lymphadenopathy Lungs: no accessory muscle use Cardiovascular: RRR, no peripheral edema Musculoskeletal: No deformities, no cyanosis or clubbing, normal tone Neuro:  A&Ox3, CN II-XII intact, normal gait Skin:  Warm, no lesions/ rash   Wt Readings from Last  3 Encounters:  05/08/20 219 lb 6.4 oz (99.5 kg)  12/28/19 218 lb 14.4 oz (99.3 kg)  11/15/18 209 lb 12.8 oz (95.2 kg)    Lab Results  Component Value Date   WBC 2.4 (L) 12/28/2019   HGB 13.2 12/28/2019   HCT 38.9 (L) 12/28/2019   PLT 279.0 12/28/2019   GLUCOSE 86 12/28/2019   CHOL 143 12/28/2019   TRIG 38.0 12/28/2019   HDL 49.90 12/28/2019   LDLCALC 85 12/28/2019   ALT 14 12/28/2019   AST 20 12/28/2019   NA 139 12/28/2019   K 4.5 12/28/2019   CL 107 12/28/2019   CREATININE 0.94  12/28/2019   BUN 15 12/28/2019   CO2 27 12/28/2019   TSH 2.43 11/15/2018   PSA 0.02 (L) 11/15/2018   INR 1.21 07/19/2013   HGBA1C 4.9 12/28/2019    Assessment/Plan:  Left ear pain  -possibly 2/2 cellulitis/pustule.  Also consider eustachian tube dysfunction/allergies -will start abx -Discussed supportive care -Continue to monitor - Plan: amoxicillin (AMOXIL) 500 MG tablet, fluticasone (FLONASE) 50 MCG/ACT nasal spray  Facial edema  Cellulitis of ear canal, left  - Plan: amoxicillin (AMOXIL) 500 MG tablet  Essential hypertension  -Controlled - Plan: atenolol (TENORMIN) 50 MG tablet  F/u as needed with PCP  Grier Mitts, MD

## 2020-09-19 NOTE — Patient Instructions (Signed)

## 2020-10-23 IMAGING — CR DG FINGER THUMB 2+V*L*
3 series · 3 of 3 positions shown · non-contrast
Comparison: Prior radiograph from 12/11/2010.

CLINICAL DATA: Initial evaluation for acute trauma.

EXAM:
LEFT THUMB 2+V

[finger ap]
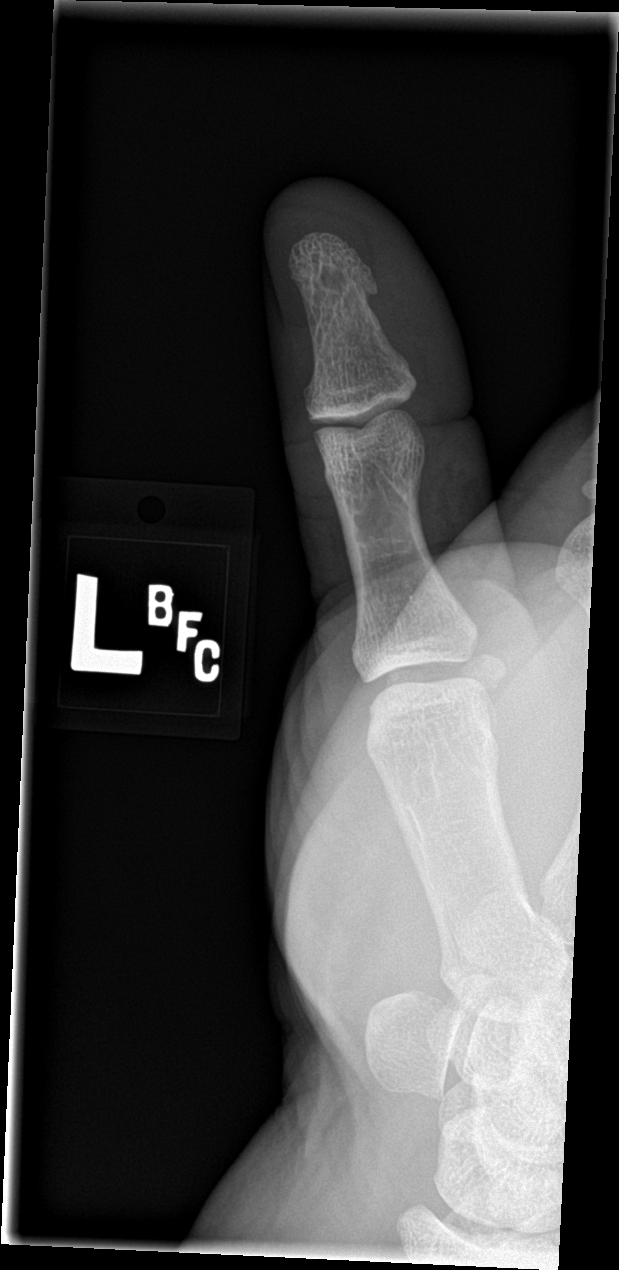

[finger obl]
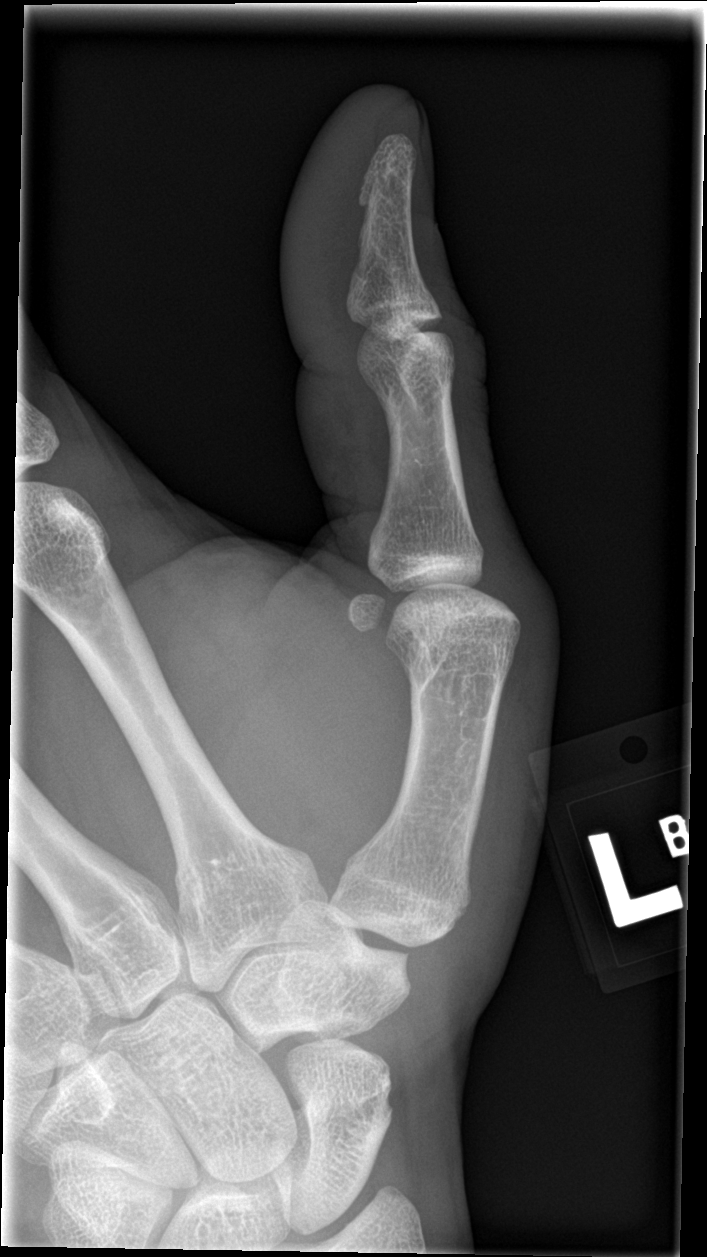

[finger lat]
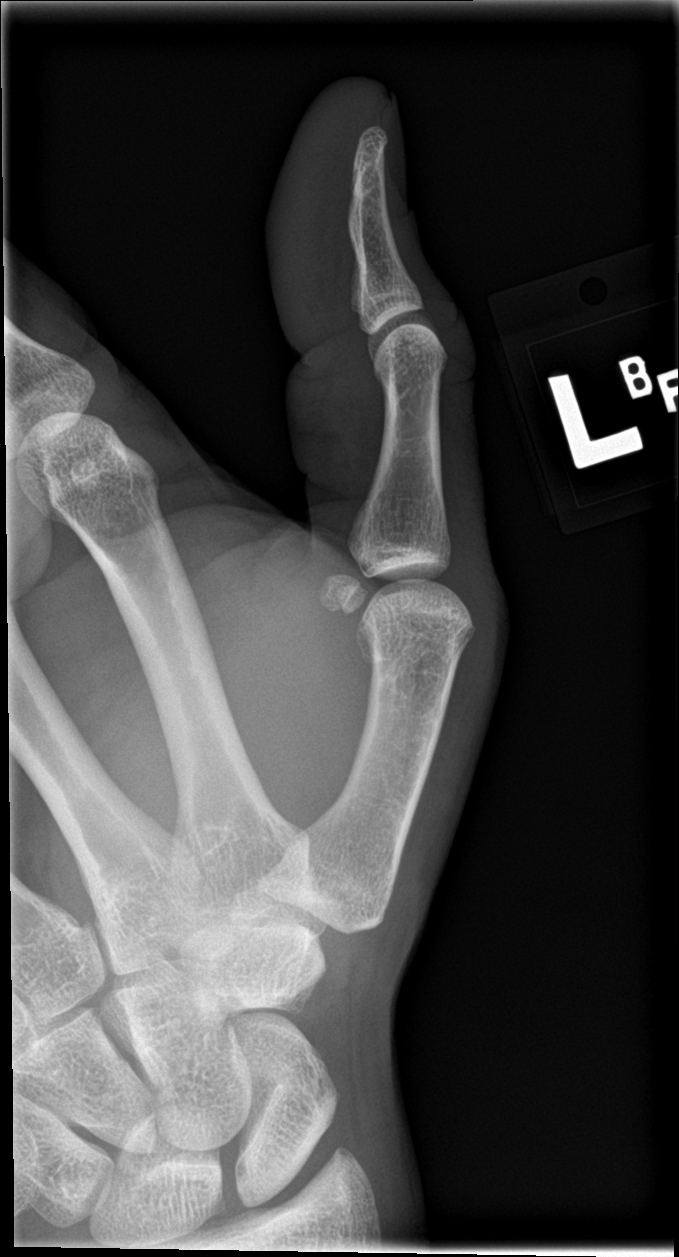

[3 of 3 positions shown; findings below may reference images not displayed]

FINDINGS: There is no evidence of fracture or dislocation. There is no
evidence of arthropathy or other focal bone abnormality. Soft
tissues are unremarkable
IMPRESSION: No acute osseous abnormality about the thumb.

## 2021-01-22 DIAGNOSIS — M545 Low back pain, unspecified: Secondary | ICD-10-CM | POA: Diagnosis not present

## 2021-01-22 DIAGNOSIS — G8929 Other chronic pain: Secondary | ICD-10-CM | POA: Diagnosis not present

## 2021-01-22 DIAGNOSIS — D869 Sarcoidosis, unspecified: Secondary | ICD-10-CM | POA: Diagnosis not present

## 2021-01-22 DIAGNOSIS — Z79899 Other long term (current) drug therapy: Secondary | ICD-10-CM | POA: Diagnosis not present

## 2021-01-22 DIAGNOSIS — M159 Polyosteoarthritis, unspecified: Secondary | ICD-10-CM | POA: Diagnosis not present

## 2021-01-22 DIAGNOSIS — Z5181 Encounter for therapeutic drug level monitoring: Secondary | ICD-10-CM | POA: Diagnosis not present

## 2021-03-10 DIAGNOSIS — M79642 Pain in left hand: Secondary | ICD-10-CM | POA: Diagnosis not present

## 2021-03-10 DIAGNOSIS — M778 Other enthesopathies, not elsewhere classified: Secondary | ICD-10-CM | POA: Diagnosis not present

## 2021-03-13 ENCOUNTER — Ambulatory Visit: Payer: Self-pay | Attending: Internal Medicine

## 2021-03-13 DIAGNOSIS — Z20822 Contact with and (suspected) exposure to covid-19: Secondary | ICD-10-CM | POA: Insufficient documentation

## 2021-03-14 LAB — NOVEL CORONAVIRUS, NAA: SARS-CoV-2, NAA: NOT DETECTED

## 2021-03-14 LAB — SARS-COV-2, NAA 2 DAY TAT

## 2021-03-23 ENCOUNTER — Ambulatory Visit: Payer: Medicare Other | Attending: Internal Medicine

## 2021-03-23 DIAGNOSIS — Z20822 Contact with and (suspected) exposure to covid-19: Secondary | ICD-10-CM

## 2021-03-24 ENCOUNTER — Encounter: Payer: Self-pay | Admitting: Internal Medicine

## 2021-03-24 LAB — SARS-COV-2, NAA 2 DAY TAT

## 2021-03-24 LAB — NOVEL CORONAVIRUS, NAA: SARS-CoV-2, NAA: DETECTED — AB

## 2021-03-25 ENCOUNTER — Telehealth (INDEPENDENT_AMBULATORY_CARE_PROVIDER_SITE_OTHER): Payer: Medicare Other | Admitting: Family Medicine

## 2021-03-25 DIAGNOSIS — U071 COVID-19: Secondary | ICD-10-CM

## 2021-03-25 MED ORDER — MOLNUPIRAVIR EUA 200MG CAPSULE: 4.0000 | ORAL_CAPSULE | Freq: Two times a day (BID) | ORAL | 0 refills | Status: AC

## 2021-03-25 NOTE — Progress Notes (Signed)
Patient ID: [name]., male   DOB: Jan 05, 1966, 55 y.o.   MRN: [id number]  This visit type was conducted due to national recommendations for restrictions regarding the COVID-19 pandemic in an effort to limit this patient's exposure and mitigate transmission in our community.   Virtual Visit via Video Note  I connected with [name] on 03/25/21 at  2:15 PM EDT by a video enabled telemedicine application and verified that I am speaking with the correct person using two identifiers.  Location patient: home Location provider:work or home office Persons participating in the virtual visit: patient, provider  I discussed the limitations of evaluation and management by telemedicine and the availability of in person appointments. The patient expressed understanding and agreed to proceed.   HPI: [name] has COVID-19.  He was diagnosed Monday.  He had rapid test at home that came back positive and he went for a confirmatory test which also was positive.  He just returned from a cruise and on the flight home Sunday night started having symptoms of fatigue, body aches, cough, headache, nasal congestion.  Some mild dyspnea this morning but no severe dyspnea.  He has history of neurosarcoidosis and also does have history of hypertrophic cardiomyopathy.  He is on Imuran.  No cardiac history otherwise.  No nausea or vomiting.   ROS: See pertinent positives and negatives per HPI.  Past Medical History:  Diagnosis Date   Allergy    Anginal pain ([place])    s/p cardiac cath 07/19/13 showing NL coronaries, EF 50-55%   Anxiety    Asthma    as child   B12 DEFICIENCY 03/06/2007   Cancer ([place])    prostate ca   CARDIOMYOPATHY, IDIOPATHIC HYPERTROPHIC 03/06/2007   Clotting disorder ([place])    knee    DEPRESSION 03/06/2007   DVT (deep venous thrombosis) (HCC)    Dysrhythmia    sees [name] for "palpitations"   GERD 06/01/2007   History of kidney stones    HYPERCHOLESTEROLEMIA 03/06/2007    HYPERTENSION 03/06/2007   [name]   IMPAIRED GLUCOSE TOLERANCE 11/08/2008   NEPHROLITHIASIS, HX OF 04/07/2010   NEUROSARCOIDOSIS 03/06/2007   OSTEOARTHRITIS 06/01/2007   Palpitations 10/23/2007   Peripheral vascular disease ([place]) 03   dvt   Pneumonia    hx of   Primary idiopathic hypertrophic cardiomyopathy ([place])    Prostate cancer ([place])    SLEEP APNEA 03/06/2007   uses vpap, last sleep study 2011, Dr. Alanson Puls, at [place], CHRONIC PAIN 03/06/2007    Past Surgical History:  Procedure Laterality Date   CARDIAC CATHETERIZATION     decompression and fusion     cervicothoracic   EYE SURGERY Bilateral    blepharoplasty   LEFT HEART CATHETERIZATION WITH CORONARY ANGIOGRAM N/A 07/19/2013   Procedure: LEFT HEART CATHETERIZATION WITH CORONARY ANGIOGRAM;  Surgeon: Jolaine Artist, MD;  Location: [facility] CATH LAB;  Service: Cardiovascular;  Laterality: N/A;   LEG SKIN LESION  BIOPSY / EXCISION     left leg   LUMBAR FUSION     PARTIAL NEPHRECTOMY Left    bx   PENILE PROSTHESIS IMPLANT     PROSTATE SURGERY     robotic prostatectomy   SPINAL CORD STIMULATOR INSERTION N/A 04/11/2014   Procedure: LUMBAR SPINAL CORD STIMULATOR INSERTION;  Surgeon: Newman Pies, MD;  Location: [place] [facility]UR[facility] ORS;  Service: Neurosurgery;  Laterality: N/A;   TONSILLECTOMY     UPPER GASTROINTESTINAL ENDOSCOPY  2009    Family History  Problem Relation Age of Onset   Heart disease Mother    Kidney disease Neg Hx        family hx    Cancer Neg Hx        family hx prostate   Colon cancer Neg Hx    Colon polyps Neg Hx    Rectal cancer Neg Hx    Stomach cancer Neg Hx     SOCIAL HX: Quit smoking 1985   Current Outpatient Medications:    albuterol (PROVENTIL HFA;VENTOLIN HFA) 108 (90 Base) MCG/ACT inhaler, Inhale 2 puffs into the lungs 2 (two) times daily as needed for wheezing or shortness of breath., Disp: 18 g, Rfl: 1   amitriptyline (ELAVIL) 25 MG tablet, TAKE 1 TABLET (25 MG TOTAL) BY MOUTH AT  BEDTIME., Disp: 30 tablet, Rfl: 5   atenolol (TENORMIN) 50 MG tablet, Take 1 tablet (50 mg total) by mouth daily., Disp: 90 tablet, Rfl: 0   atorvastatin (LIPITOR) 40 MG tablet, Take 1 tablet (40 mg total) by mouth daily., Disp: 90 tablet, Rfl: 3   azaTHIOprine (IMURAN) 50 MG tablet, Take 250 mg by mouth daily. , Disp: , Rfl:    buPROPion (WELLBUTRIN XL) 300 MG 24 hr tablet, Take 1 tablet (300 mg total) by mouth every morning., Disp: 90 tablet, Rfl: 1   cyanocobalamin (,VITAMIN B-12,) 1000 MCG/ML injection, Inject 1 mL (1,000 mcg total) into the muscle every 30 (thirty) days., Disp: 6 mL, Rfl: 2   cyclobenzaprine (FLEXERIL) 10 MG tablet, Take 1 tablet by mouth daily as needed., Disp: , Rfl:    cycloSPORINE (RESTASIS) 0.05 % ophthalmic emulsion, Place 2 drops into both eyes 2 (two) times daily., Disp: , Rfl:    dicyclomine (BENTYL) 10 MG capsule, TAKE 1 CAPSULE BY MOUTH 4 TIMES DAILY, Disp: 360 capsule, Rfl: 0   DULoxetine (CYMBALTA) 30 MG capsule, Take 1 capsule (30 mg total) by mouth daily., Disp: 90 capsule, Rfl: 0   DULoxetine (CYMBALTA) 60 MG capsule, Take 1 capsule (60 mg total) by mouth daily., Disp: 90 capsule, Rfl: 1   erythromycin ophthalmic ointment, Place 1 application into both eyes at bedtime., Disp: , Rfl:    fluticasone (FLONASE) 50 MCG/ACT nasal spray, Place 1 spray into both nostrils daily., Disp: 16 g, Rfl: 0   gabapentin (NEURONTIN) 300 MG capsule, TAKE ONE CAPSULE BY MOUTH FOUR TIMES A DAY, Disp: 360 capsule, Rfl: 0   guaiFENesin (MUCINEX) 600 MG 12 hr tablet, Take 600 mg by mouth 2 (two) times daily. , Disp: , Rfl:    hydrocortisone (ANUSOL-HC) 2.5 % rectal cream, Place 1 application rectally 3 (three) times daily., Disp: 30 g, Rfl: 1   lidocaine (XYLOCAINE) 5 % ointment, Apply 1 application topically daily as needed for moderate pain. , Disp: , Rfl:    molnupiravir EUA 200 mg CAPS, Take 4 capsules (800 mg total) by mouth 2 (two) times daily for 5 days., Disp: 40 capsule, Rfl:  0   NEXIUM 40 MG capsule, Take 1 capsule (40 mg total) by mouth daily., Disp: 90 capsule, Rfl: 1   nortriptyline (PAMELOR) 25 MG capsule, Take 25 mg by mouth at bedtime., Disp: , Rfl:    oxyCODONE-acetaminophen (PERCOCET) 10-325 MG tablet, Take 1 tablet by mouth See admin instructions. Take 1 tablet by mouth every morning and 1 tablet before 6pm, Disp: , Rfl:    Polyethyl Glycol-Propyl Glycol (SYSTANE PRESERVATIVE FREE OP), Place 1 drop into both eyes 2 (two) times daily., Disp: , Rfl:  SYRINGE-NEEDLE, DISP, 3 ML (BD SAFETYGLIDE SYRINGE/NEEDLE) 25G X 1" 3 ML MISC, Use for B12 injections, Disp: 100 each, Rfl: 11   triamcinolone (NASACORT AQ) 55 MCG/ACT AERO nasal inhaler, Place 1 spray into the nose daily as needed (dry mouth)., Disp: 1 Inhaler, Rfl: 2   triamcinolone ointment (KENALOG) 0.1 %, Apply 1 application topically daily. , Disp: , Rfl:   Current Facility-Administered Medications:    0.9 %  sodium chloride infusion, 500 mL, Intravenous, Once, Danis, Kirke Corin, MD  EXAM:  VITALS per patient if applicable:  GENERAL: alert, oriented, appears well and in no acute distress  HEENT: atraumatic, conjunttiva clear, no obvious abnormalities on inspection of external nose and ears  NECK: normal movements of the head and neck  LUNGS: on inspection no signs of respiratory distress, breathing rate appears normal, no obvious gross SOB, gasping or wheezing  CV: no obvious cyanosis  MS: moves all visible extremities without noticeable abnormality  PSYCH/NEURO: pleasant and cooperative, no obvious depression or anxiety, speech and thought processing grossly intact  ASSESSMENT AND PLAN:  Discussed the following assessment and plan:  COVID-19 infection.  Patient had onset of symptoms less than 72 hours ago.  Risk factors of immunosuppressant therapy with Imuran and history of hypertrophic cardiomyopathy  -Start molnupiravir 4 capsules twice daily. -Plenty of fluids and rest -Follow-up or  go to ER for any increased dyspnea or be in touch with [place] if not significantly improving over the next 48 hours     I discussed the assessment and treatment plan with the patient. The patient was provided an opportunity to ask questions and all were answered. The patient agreed with the plan and demonstrated an understanding of the instructions.   The patient was advised to call back or seek an in-person evaluation if the symptoms worsen or if the condition fails to improve as anticipated.     Carolann Littler, MD

## 2021-04-07 DIAGNOSIS — S63642A Sprain of metacarpophalangeal joint of left thumb, initial encounter: Secondary | ICD-10-CM | POA: Diagnosis not present

## 2021-04-07 DIAGNOSIS — M79645 Pain in left finger(s): Secondary | ICD-10-CM | POA: Diagnosis not present

## 2021-04-07 DIAGNOSIS — M79642 Pain in left hand: Secondary | ICD-10-CM | POA: Diagnosis not present

## 2021-04-22 DIAGNOSIS — M25532 Pain in left wrist: Secondary | ICD-10-CM | POA: Diagnosis not present

## 2021-04-28 DIAGNOSIS — S63592A Other specified sprain of left wrist, initial encounter: Secondary | ICD-10-CM | POA: Diagnosis not present

## 2021-04-28 DIAGNOSIS — M24132 Other articular cartilage disorders, left wrist: Secondary | ICD-10-CM | POA: Diagnosis not present

## 2021-05-07 ENCOUNTER — Other Ambulatory Visit: Payer: Self-pay

## 2021-05-07 DIAGNOSIS — I10 Essential (primary) hypertension: Secondary | ICD-10-CM

## 2021-05-07 MED ORDER — ATENOLOL 50 MG PO TABS: 50.0000 mg | ORAL_TABLET | Freq: Every day | ORAL | 0 refills | Status: DC

## 2021-05-29 DIAGNOSIS — S63592A Other specified sprain of left wrist, initial encounter: Secondary | ICD-10-CM | POA: Diagnosis not present

## 2021-05-29 DIAGNOSIS — M24132 Other articular cartilage disorders, left wrist: Secondary | ICD-10-CM | POA: Diagnosis not present

## 2021-06-10 DIAGNOSIS — H0102A Squamous blepharitis right eye, upper and lower eyelids: Secondary | ICD-10-CM | POA: Diagnosis not present

## 2021-06-10 DIAGNOSIS — Z79899 Other long term (current) drug therapy: Secondary | ICD-10-CM | POA: Diagnosis not present

## 2021-06-10 DIAGNOSIS — H2513 Age-related nuclear cataract, bilateral: Secondary | ICD-10-CM | POA: Diagnosis not present

## 2021-06-10 DIAGNOSIS — H524 Presbyopia: Secondary | ICD-10-CM | POA: Diagnosis not present

## 2021-06-10 DIAGNOSIS — H04123 Dry eye syndrome of bilateral lacrimal glands: Secondary | ICD-10-CM | POA: Diagnosis not present

## 2021-06-10 DIAGNOSIS — H53002 Unspecified amblyopia, left eye: Secondary | ICD-10-CM | POA: Diagnosis not present

## 2021-06-10 DIAGNOSIS — H52203 Unspecified astigmatism, bilateral: Secondary | ICD-10-CM | POA: Diagnosis not present

## 2021-06-10 DIAGNOSIS — D869 Sarcoidosis, unspecified: Secondary | ICD-10-CM | POA: Diagnosis not present

## 2021-06-10 DIAGNOSIS — H5213 Myopia, bilateral: Secondary | ICD-10-CM | POA: Diagnosis not present

## 2021-06-10 DIAGNOSIS — H0102B Squamous blepharitis left eye, upper and lower eyelids: Secondary | ICD-10-CM | POA: Diagnosis not present

## 2021-06-10 DIAGNOSIS — H0259 Other disorders affecting eyelid function: Secondary | ICD-10-CM | POA: Diagnosis not present

## 2021-06-30 DIAGNOSIS — M65341 Trigger finger, right ring finger: Secondary | ICD-10-CM | POA: Diagnosis not present

## 2021-06-30 DIAGNOSIS — S63592A Other specified sprain of left wrist, initial encounter: Secondary | ICD-10-CM | POA: Diagnosis not present

## 2021-07-02 DIAGNOSIS — S63592A Other specified sprain of left wrist, initial encounter: Secondary | ICD-10-CM | POA: Diagnosis not present

## 2021-07-02 DIAGNOSIS — M24132 Other articular cartilage disorders, left wrist: Secondary | ICD-10-CM | POA: Diagnosis not present

## 2021-07-02 DIAGNOSIS — M25672 Stiffness of left ankle, not elsewhere classified: Secondary | ICD-10-CM | POA: Diagnosis not present

## 2021-07-08 DIAGNOSIS — M25632 Stiffness of left wrist, not elsewhere classified: Secondary | ICD-10-CM | POA: Diagnosis not present

## 2021-07-08 DIAGNOSIS — M24132 Other articular cartilage disorders, left wrist: Secondary | ICD-10-CM | POA: Diagnosis not present

## 2021-07-08 DIAGNOSIS — S63592A Other specified sprain of left wrist, initial encounter: Secondary | ICD-10-CM | POA: Diagnosis not present

## 2021-07-08 DIAGNOSIS — R52 Pain, unspecified: Secondary | ICD-10-CM | POA: Diagnosis not present

## 2021-07-13 ENCOUNTER — Other Ambulatory Visit: Payer: Self-pay | Admitting: Cardiology

## 2021-07-13 DIAGNOSIS — I10 Essential (primary) hypertension: Secondary | ICD-10-CM

## 2021-07-15 DIAGNOSIS — M25632 Stiffness of left wrist, not elsewhere classified: Secondary | ICD-10-CM | POA: Diagnosis not present

## 2021-07-15 DIAGNOSIS — R52 Pain, unspecified: Secondary | ICD-10-CM | POA: Diagnosis not present

## 2021-07-15 DIAGNOSIS — M24132 Other articular cartilage disorders, left wrist: Secondary | ICD-10-CM | POA: Diagnosis not present

## 2021-07-15 DIAGNOSIS — S63592A Other specified sprain of left wrist, initial encounter: Secondary | ICD-10-CM | POA: Diagnosis not present

## 2021-07-27 DIAGNOSIS — M159 Polyosteoarthritis, unspecified: Secondary | ICD-10-CM | POA: Diagnosis not present

## 2021-07-27 DIAGNOSIS — D869 Sarcoidosis, unspecified: Secondary | ICD-10-CM | POA: Diagnosis not present

## 2021-07-27 DIAGNOSIS — Z23 Encounter for immunization: Secondary | ICD-10-CM | POA: Diagnosis not present

## 2021-07-27 DIAGNOSIS — M545 Low back pain, unspecified: Secondary | ICD-10-CM | POA: Diagnosis not present

## 2021-07-27 DIAGNOSIS — Z79899 Other long term (current) drug therapy: Secondary | ICD-10-CM | POA: Diagnosis not present

## 2021-07-27 DIAGNOSIS — G8929 Other chronic pain: Secondary | ICD-10-CM | POA: Diagnosis not present

## 2021-08-05 DIAGNOSIS — M25632 Stiffness of left wrist, not elsewhere classified: Secondary | ICD-10-CM | POA: Diagnosis not present

## 2021-08-05 DIAGNOSIS — S63592A Other specified sprain of left wrist, initial encounter: Secondary | ICD-10-CM | POA: Diagnosis not present

## 2021-08-05 DIAGNOSIS — R52 Pain, unspecified: Secondary | ICD-10-CM | POA: Diagnosis not present

## 2021-08-05 DIAGNOSIS — M24132 Other articular cartilage disorders, left wrist: Secondary | ICD-10-CM | POA: Diagnosis not present

## 2021-08-05 DIAGNOSIS — M5412 Radiculopathy, cervical region: Secondary | ICD-10-CM | POA: Diagnosis not present

## 2021-08-07 DIAGNOSIS — H04123 Dry eye syndrome of bilateral lacrimal glands: Secondary | ICD-10-CM | POA: Diagnosis not present

## 2021-08-07 DIAGNOSIS — H52203 Unspecified astigmatism, bilateral: Secondary | ICD-10-CM | POA: Diagnosis not present

## 2021-08-07 DIAGNOSIS — D869 Sarcoidosis, unspecified: Secondary | ICD-10-CM | POA: Diagnosis not present

## 2021-08-07 DIAGNOSIS — H0102B Squamous blepharitis left eye, upper and lower eyelids: Secondary | ICD-10-CM | POA: Diagnosis not present

## 2021-08-07 DIAGNOSIS — H527 Unspecified disorder of refraction: Secondary | ICD-10-CM | POA: Diagnosis not present

## 2021-08-07 DIAGNOSIS — H524 Presbyopia: Secondary | ICD-10-CM | POA: Diagnosis not present

## 2021-08-07 DIAGNOSIS — H0100A Unspecified blepharitis right eye, upper and lower eyelids: Secondary | ICD-10-CM | POA: Diagnosis not present

## 2021-08-07 DIAGNOSIS — H2513 Age-related nuclear cataract, bilateral: Secondary | ICD-10-CM | POA: Diagnosis not present

## 2021-08-07 DIAGNOSIS — H0102A Squamous blepharitis right eye, upper and lower eyelids: Secondary | ICD-10-CM | POA: Diagnosis not present

## 2021-08-07 DIAGNOSIS — H5213 Myopia, bilateral: Secondary | ICD-10-CM | POA: Diagnosis not present

## 2021-08-07 DIAGNOSIS — H53002 Unspecified amblyopia, left eye: Secondary | ICD-10-CM | POA: Diagnosis not present

## 2021-08-07 DIAGNOSIS — H0100B Unspecified blepharitis left eye, upper and lower eyelids: Secondary | ICD-10-CM | POA: Diagnosis not present

## 2021-08-07 DIAGNOSIS — H0259 Other disorders affecting eyelid function: Secondary | ICD-10-CM | POA: Diagnosis not present

## 2021-08-11 DIAGNOSIS — G5622 Lesion of ulnar nerve, left upper limb: Secondary | ICD-10-CM | POA: Diagnosis not present

## 2021-08-11 DIAGNOSIS — M25632 Stiffness of left wrist, not elsewhere classified: Secondary | ICD-10-CM | POA: Diagnosis not present

## 2021-08-11 DIAGNOSIS — M24132 Other articular cartilage disorders, left wrist: Secondary | ICD-10-CM | POA: Diagnosis not present

## 2021-08-11 DIAGNOSIS — S63592A Other specified sprain of left wrist, initial encounter: Secondary | ICD-10-CM | POA: Diagnosis not present

## 2021-08-11 DIAGNOSIS — R52 Pain, unspecified: Secondary | ICD-10-CM | POA: Diagnosis not present

## 2021-08-18 DIAGNOSIS — M24132 Other articular cartilage disorders, left wrist: Secondary | ICD-10-CM | POA: Diagnosis not present

## 2021-08-18 DIAGNOSIS — M25632 Stiffness of left wrist, not elsewhere classified: Secondary | ICD-10-CM | POA: Diagnosis not present

## 2021-08-18 DIAGNOSIS — R52 Pain, unspecified: Secondary | ICD-10-CM | POA: Diagnosis not present

## 2021-08-18 DIAGNOSIS — S63592A Other specified sprain of left wrist, initial encounter: Secondary | ICD-10-CM | POA: Diagnosis not present

## 2021-08-18 DIAGNOSIS — W19XXXD Unspecified fall, subsequent encounter: Secondary | ICD-10-CM | POA: Diagnosis not present

## 2021-08-25 DIAGNOSIS — R52 Pain, unspecified: Secondary | ICD-10-CM | POA: Diagnosis not present

## 2021-08-25 DIAGNOSIS — M79642 Pain in left hand: Secondary | ICD-10-CM | POA: Diagnosis not present

## 2021-08-25 DIAGNOSIS — M24132 Other articular cartilage disorders, left wrist: Secondary | ICD-10-CM | POA: Diagnosis not present

## 2021-08-25 DIAGNOSIS — S63592A Other specified sprain of left wrist, initial encounter: Secondary | ICD-10-CM | POA: Diagnosis not present

## 2021-08-25 DIAGNOSIS — W19XXXD Unspecified fall, subsequent encounter: Secondary | ICD-10-CM | POA: Diagnosis not present

## 2021-08-25 DIAGNOSIS — M25632 Stiffness of left wrist, not elsewhere classified: Secondary | ICD-10-CM | POA: Diagnosis not present

## 2021-08-26 DIAGNOSIS — Z79899 Other long term (current) drug therapy: Secondary | ICD-10-CM | POA: Diagnosis not present

## 2021-08-26 DIAGNOSIS — M7582 Other shoulder lesions, left shoulder: Secondary | ICD-10-CM | POA: Diagnosis not present

## 2021-08-26 DIAGNOSIS — M25512 Pain in left shoulder: Secondary | ICD-10-CM | POA: Diagnosis not present

## 2021-08-26 DIAGNOSIS — D869 Sarcoidosis, unspecified: Secondary | ICD-10-CM | POA: Diagnosis not present

## 2021-08-26 DIAGNOSIS — M89312 Hypertrophy of bone, left shoulder: Secondary | ICD-10-CM | POA: Diagnosis not present

## 2021-09-02 ENCOUNTER — Other Ambulatory Visit: Payer: Self-pay | Admitting: Cardiology

## 2021-09-02 DIAGNOSIS — I10 Essential (primary) hypertension: Secondary | ICD-10-CM

## 2021-09-04 DIAGNOSIS — G5622 Lesion of ulnar nerve, left upper limb: Secondary | ICD-10-CM | POA: Diagnosis not present

## 2021-10-28 ENCOUNTER — Other Ambulatory Visit: Payer: Self-pay | Admitting: Cardiology

## 2021-10-28 DIAGNOSIS — I10 Essential (primary) hypertension: Secondary | ICD-10-CM

## 2021-11-02 ENCOUNTER — Other Ambulatory Visit: Payer: Self-pay

## 2021-11-02 ENCOUNTER — Ambulatory Visit (INDEPENDENT_AMBULATORY_CARE_PROVIDER_SITE_OTHER): Payer: Self-pay | Admitting: Cardiology

## 2021-11-02 ENCOUNTER — Encounter: Payer: Self-pay | Admitting: Cardiology

## 2021-11-02 VITALS — BP 124/84 | HR 62 | Ht 69.0 in | Wt 219.0 lb

## 2021-11-02 DIAGNOSIS — R002 Palpitations: Secondary | ICD-10-CM | POA: Diagnosis not present

## 2021-11-02 DIAGNOSIS — I10 Essential (primary) hypertension: Secondary | ICD-10-CM | POA: Diagnosis not present

## 2021-11-02 DIAGNOSIS — R072 Precordial pain: Secondary | ICD-10-CM

## 2021-11-02 NOTE — Patient Instructions (Signed)

## 2021-11-02 NOTE — Progress Notes (Signed)
HEN:IDPOEU-MP chest pain.  Cardiac MRI January 2012 showed normal LV function, mild left atrial enlargement and no infiltrative process.  Patient had cardiac catheterization in 2014 that showed normal coronary arteries and normal LV function.  Monitor May 2017 showed sinus with PVCs.  Cardiac CTA October 2019 showed calcium score of 0 and no coronary disease; prominent lymph nodes noted felt likely reactive; follow-up chest CT could be considered in 6 months.  Echocardiogram September 2021 showed normal LV function, basal septal hypertrophy, mild to moderate left atrial enlargement, chordal Sam.  Chest CT September 2021 with small bilateral pulmonary nodules and benign etiology such as sarcoidosis favored.  Since last seen he denies dyspnea on exertion, orthopnea, PND, pedal edema, palpitations or syncope.  He occasionally feels a sharp pain in his left or right chest not related to activities lasting seconds.  Current Outpatient Medications  Medication Sig Dispense Refill   albuterol (PROVENTIL HFA;VENTOLIN HFA) 108 (90 Base) MCG/ACT inhaler Inhale 2 puffs into the lungs 2 (two) times daily as needed for wheezing or shortness of breath. 18 g 1   amitriptyline (ELAVIL) 25 MG tablet TAKE 1 TABLET (25 MG TOTAL) BY MOUTH AT BEDTIME. 30 tablet 5   atenolol (TENORMIN) 50 MG tablet Take 1 tablet (50 mg total) by mouth daily. NEED OV. 15 tablet 0   atorvastatin (LIPITOR) 40 MG tablet Take 1 tablet (40 mg total) by mouth daily. 90 tablet 3   azaTHIOprine (IMURAN) 50 MG tablet Take 250 mg by mouth daily.      buPROPion (WELLBUTRIN XL) 300 MG 24 hr tablet Take 1 tablet (300 mg total) by mouth every morning. 90 tablet 1   cyanocobalamin (,VITAMIN B-12,) 1000 MCG/ML injection Inject 1 mL (1,000 mcg total) into the muscle every 30 (thirty) days. 6 mL 2   cyclobenzaprine (FLEXERIL) 10 MG tablet Take 1 tablet by mouth daily as needed.     cycloSPORINE (RESTASIS) 0.05 % ophthalmic emulsion Place 2 drops into  both eyes 2 (two) times daily.     dicyclomine (BENTYL) 10 MG capsule TAKE 1 CAPSULE BY MOUTH 4 TIMES DAILY 360 capsule 0   DULoxetine (CYMBALTA) 30 MG capsule Take 1 capsule (30 mg total) by mouth daily. 90 capsule 0   DULoxetine (CYMBALTA) 60 MG capsule Take 1 capsule (60 mg total) by mouth daily. 90 capsule 1   erythromycin ophthalmic ointment Place 1 application into both eyes at bedtime.     fluticasone (FLONASE) 50 MCG/ACT nasal spray Place 1 spray into both nostrils daily. 16 g 0   gabapentin (NEURONTIN) 300 MG capsule TAKE ONE CAPSULE BY MOUTH FOUR TIMES A DAY 360 capsule 0   guaiFENesin (MUCINEX) 600 MG 12 hr tablet Take 600 mg by mouth 2 (two) times daily.      hydrocortisone (ANUSOL-HC) 2.5 % rectal cream Place 1 application rectally 3 (three) times daily. 30 g 1   lidocaine (XYLOCAINE) 5 % ointment Apply 1 application topically daily as needed for moderate pain.      NEXIUM 40 MG capsule Take 1 capsule (40 mg total) by mouth daily. 90 capsule 1   nortriptyline (PAMELOR) 25 MG capsule Take 25 mg by mouth at bedtime.     oxyCODONE-acetaminophen (PERCOCET) 10-325 MG tablet Take 1 tablet by mouth See admin instructions. Take 1 tablet by mouth every morning and 1 tablet before 6pm     Polyethyl Glycol-Propyl Glycol (SYSTANE PRESERVATIVE FREE OP) Place 1 drop into both eyes 2 (two) times  daily.     SYRINGE-NEEDLE, DISP, 3 ML (BD SAFETYGLIDE SYRINGE/NEEDLE) 25G X 1" 3 ML MISC Use for B12 injections 100 each 11   triamcinolone (NASACORT AQ) 55 MCG/ACT AERO nasal inhaler Place 1 spray into the nose daily as needed (dry mouth). 1 Inhaler 2   triamcinolone ointment (KENALOG) 0.1 % Apply 1 application topically daily.      Current Facility-Administered Medications  Medication Dose Route Frequency Provider Last Rate Last Admin   0.9 %  sodium chloride infusion  500 mL Intravenous Once Doran Stabler, MD         Past Medical History:  Diagnosis Date   Allergy    Anginal pain ([place])     s/p cardiac cath 07/19/13 showing NL coronaries, EF 50-55%   Anxiety    Asthma    as child   B12 DEFICIENCY 03/06/2007   Cancer ([place])    prostate ca   CARDIOMYOPATHY, IDIOPATHIC HYPERTROPHIC 03/06/2007   Clotting disorder ([place])    knee    DEPRESSION 03/06/2007   DVT (deep venous thrombosis) (HCC)    Dysrhythmia    sees [name] for "palpitations"   GERD 06/01/2007   History of kidney stones    HYPERCHOLESTEROLEMIA 03/06/2007   HYPERTENSION 03/06/2007   [name]   IMPAIRED GLUCOSE TOLERANCE 11/08/2008   NEPHROLITHIASIS, HX OF 04/07/2010   NEUROSARCOIDOSIS 03/06/2007   OSTEOARTHRITIS 06/01/2007   Palpitations 10/23/2007   Peripheral vascular disease ([place]) 03   dvt   Pneumonia    hx of   Primary idiopathic hypertrophic cardiomyopathy ([place])    Prostate cancer ([place])    SLEEP APNEA 03/06/2007   uses vpap, last sleep study 2011, Dr. Alanson Puls, at [place], [place] 03/06/2007    Past Surgical History:  Procedure Laterality Date   CARDIAC CATHETERIZATION     decompression and fusion     cervicothoracic   EYE SURGERY Bilateral    blepharoplasty   LEFT HEART CATHETERIZATION WITH CORONARY ANGIOGRAM N/A 07/19/2013   Procedure: LEFT HEART CATHETERIZATION WITH CORONARY ANGIOGRAM;  Surgeon: Jolaine Artist, MD;  Location: [facility] CATH LAB;  Service: Cardiovascular;  Laterality: N/A;   LEG SKIN LESION  BIOPSY / EXCISION     left leg   LUMBAR FUSION     PARTIAL NEPHRECTOMY Left    bx   PENILE PROSTHESIS IMPLANT     PROSTATE SURGERY     robotic prostatectomy   SPINAL CORD STIMULATOR INSERTION N/A 04/11/2014   Procedure: LUMBAR SPINAL CORD STIMULATOR INSERTION;  Surgeon: Newman Pies, MD;  Location: [place] NEUR[facility] OR[facility];  Service: Neurosurgery;  Laterality: N/A;   TONSILLECTOMY     UPPER GASTROINTESTINAL ENDOSCOPY  2009    Social History   Socioeconomic History   Marital status: Divorced    Spouse name: Not on file   Number of children: Not on file   Years of education:  Not on file   Highest education level: Not on file  Occupational History   Occupation: Funeral home  Tobacco Use   Smoking status: Former    Packs/day: 0.50    Years: 3.00    Pack years: 1.50    Types: Cigarettes    Quit date: 09/07/1983    Years since quitting: 38.1   Smokeless tobacco: Never   Tobacco comments:    brief history   Substance and Sexual Activity   Alcohol use: No    Comment: occ   Drug use: No   Sexual activity: Yes  Other Topics Concern   Not on file  Social History Narrative   Recently divorced, lives alone, negotiating with wife currently regarding having son live with him, high stress   Has three children, two local, one in [place].   Enjoys swimming, wants to get back into swimming routine   Finished schooling for funeral services, currently finishing up apprenticeship.    Social Determinants of Radio broadcast assistant Strain: Not on file  Food Insecurity: Not on file  Transportation Needs: Not on file  Physical Activity: Not on file  Stress: Not on file  Social Connections: Not on file  Intimate Partner Violence: Not on file    Family History  Problem Relation Age of Onset   Heart disease Mother    Kidney disease Neg Hx        family hx    Cancer Neg Hx        family hx prostate   Colon cancer Neg Hx    Colon polyps Neg Hx    Rectal cancer Neg Hx    Stomach cancer Neg Hx     ROS: no fevers or chills, productive cough, hemoptysis, dysphasia, odynophagia, melena, hematochezia, dysuria, hematuria, rash, seizure activity, orthopnea, PND, pedal edema, claudication. Remaining systems are negative.  Physical Exam: Well-developed well-nourished in no acute distress.  Skin is warm and dry.  HEENT is normal.  Neck is supple.  Chest is clear to auscultation with normal expansion.  Cardiovascular exam is regular rate and rhythm.  Abdominal exam nontender or distended. No masses palpated. Extremities show no edema. neuro grossly  intact  ECG-normal sinus rhythm at a rate of 62, left axis deviation, no ST changes.  Personally reviewed  A/P  1 chest pain-symptoms atypical and electrocardiogram with no ST changes.  Previous evaluation negative.  We will follow for now.  2 hypertension-blood pressure controlled.  Continue present medical regimen.  3 palpitations-continue beta-blocker.  4 hyperlipidemia-managed by primary care.  5 sarcoidosis-followed by rheumatology.    Kirk Ruths, MD

## 2021-11-21 ENCOUNTER — Other Ambulatory Visit: Payer: Self-pay | Admitting: Cardiology

## 2021-11-21 DIAGNOSIS — I10 Essential (primary) hypertension: Secondary | ICD-10-CM

## 2021-11-30 DIAGNOSIS — Z79899 Other long term (current) drug therapy: Secondary | ICD-10-CM | POA: Diagnosis not present

## 2021-11-30 DIAGNOSIS — D860 Sarcoidosis of lung: Secondary | ICD-10-CM | POA: Diagnosis not present

## 2021-11-30 DIAGNOSIS — D863 Sarcoidosis of skin: Secondary | ICD-10-CM | POA: Diagnosis not present

## 2021-11-30 DIAGNOSIS — Z79624 Long term (current) use of inhibitors of nucleotide synthesis: Secondary | ICD-10-CM | POA: Diagnosis not present

## 2021-11-30 DIAGNOSIS — D8689 Sarcoidosis of other sites: Secondary | ICD-10-CM | POA: Diagnosis not present

## 2022-01-28 ENCOUNTER — Ambulatory Visit (INDEPENDENT_AMBULATORY_CARE_PROVIDER_SITE_OTHER): Payer: Self-pay | Admitting: Internal Medicine

## 2022-01-28 ENCOUNTER — Other Ambulatory Visit: Payer: Self-pay | Admitting: Internal Medicine

## 2022-01-28 ENCOUNTER — Other Ambulatory Visit: Payer: Self-pay

## 2022-01-28 ENCOUNTER — Encounter: Payer: Self-pay | Admitting: Internal Medicine

## 2022-01-28 VITALS — BP 110/80 | HR 55 | Temp 98.4°F | Ht 69.0 in | Wt 222.4 lb

## 2022-01-28 DIAGNOSIS — I421 Obstructive hypertrophic cardiomyopathy: Secondary | ICD-10-CM

## 2022-01-28 DIAGNOSIS — E7800 Pure hypercholesterolemia, unspecified: Secondary | ICD-10-CM

## 2022-01-28 DIAGNOSIS — F339 Major depressive disorder, recurrent, unspecified: Secondary | ICD-10-CM

## 2022-01-28 DIAGNOSIS — E538 Deficiency of other specified B group vitamins: Secondary | ICD-10-CM

## 2022-01-28 DIAGNOSIS — E559 Vitamin D deficiency, unspecified: Secondary | ICD-10-CM

## 2022-01-28 DIAGNOSIS — C61 Malignant neoplasm of prostate: Secondary | ICD-10-CM

## 2022-01-28 DIAGNOSIS — Z0000 Encounter for general adult medical examination without abnormal findings: Secondary | ICD-10-CM

## 2022-01-28 DIAGNOSIS — I10 Essential (primary) hypertension: Secondary | ICD-10-CM

## 2022-01-28 DIAGNOSIS — D869 Sarcoidosis, unspecified: Secondary | ICD-10-CM

## 2022-01-28 DIAGNOSIS — F17210 Nicotine dependence, cigarettes, uncomplicated: Secondary | ICD-10-CM

## 2022-01-28 LAB — VITAMIN B12: Vitamin B-12: 106 pg/mL — ABNORMAL LOW (ref 211–911)

## 2022-01-28 LAB — COMPREHENSIVE METABOLIC PANEL
ALT: 12 U/L (ref 0–53)
AST: 17 U/L (ref 0–37)
Albumin: 4.6 g/dL (ref 3.5–5.2)
Alkaline Phosphatase: 33 U/L — ABNORMAL LOW (ref 39–117)
BUN: 8 mg/dL (ref 6–23)
CO2: 29 mEq/L (ref 19–32)
Calcium: 9.6 mg/dL (ref 8.4–10.5)
Chloride: 105 mEq/L (ref 96–112)
Creatinine, Ser: 0.92 mg/dL (ref 0.40–1.50)
GFR: 92.83 mL/min (ref >60.00)
Glucose, Bld: 92 mg/dL (ref 70–99)
Potassium: 4.5 mEq/L (ref 3.5–5.1)
Sodium: 140 mEq/L (ref 135–145)
Total Bilirubin: 1 mg/dL (ref 0.2–1.2)
Total Protein: 7.1 g/dL (ref 6.0–8.3)

## 2022-01-28 LAB — CBC WITH DIFFERENTIAL/PLATELET
Basophils Absolute: 0 10*3/uL (ref 0.0–0.1)
Basophils Relative: 0.8 % (ref 0.0–3.0)
Eosinophils Absolute: 0.1 10*3/uL (ref 0.0–0.7)
Eosinophils Relative: 3 % (ref 0.0–5.0)
HCT: 40.2 % (ref 39.0–52.0)
Hemoglobin: 13.5 g/dL (ref 13.0–17.0)
Lymphocytes Relative: 19 % (ref 12.0–46.0)
Lymphs Abs: 0.4 10*3/uL — ABNORMAL LOW (ref 0.7–4.0)
MCHC: 33.6 g/dL (ref 30.0–36.0)
MCV: 92.7 fl (ref 78.0–100.0)
Monocytes Absolute: 0.7 10*3/uL (ref 0.1–1.0)
Monocytes Relative: 29.1 % — ABNORMAL HIGH (ref 3.0–12.0)
Neutro Abs: 1.1 10*3/uL — ABNORMAL LOW (ref 1.4–7.7)
Neutrophils Relative %: 48.1 % (ref 43.0–77.0)
Platelets: 302 10*3/uL (ref 150.0–400.0)
RBC: 4.34 Mil/uL (ref 4.22–5.81)
RDW: 14.9 % (ref 11.5–15.5)
WBC: 2.3 10*3/uL — ABNORMAL LOW (ref 4.0–10.5)

## 2022-01-28 LAB — LIPID PANEL
Cholesterol: 155 mg/dL (ref 0–200)
HDL: 53.2 mg/dL (ref >39.00)
LDL Cholesterol: 94 mg/dL (ref 0–99)
NonHDL: 102.05
Total CHOL/HDL Ratio: 3
Triglycerides: 38 mg/dL (ref 0.0–149.0)
VLDL: 7.6 mg/dL (ref 0.0–40.0)

## 2022-01-28 LAB — TSH: TSH: 2.18 u[IU]/mL (ref 0.35–5.50)

## 2022-01-28 LAB — HEMOGLOBIN A1C: Hgb A1c MFr Bld: 5.2 % (ref 4.6–6.5)

## 2022-01-28 LAB — PSA: PSA: 0.01 ng/mL — ABNORMAL LOW (ref 0.10–4.00)

## 2022-01-28 LAB — VITAMIN D 25 HYDROXY (VIT D DEFICIENCY, FRACTURES): VITD: 20.3 ng/mL — ABNORMAL LOW (ref 30.00–100.00)

## 2022-01-28 MED ORDER — VITAMIN D (ERGOCALCIFEROL) 1.25 MG (50000 UNIT) PO CAPS: 50000.0000 [IU] | ORAL_CAPSULE | ORAL | 0 refills | Status: AC

## 2022-01-28 MED ORDER — GABAPENTIN 300 MG PO CAPS: ORAL_CAPSULE | ORAL | 0 refills | Status: DC

## 2022-01-28 MED ORDER — "BD SAFETYGLIDE SYRINGE/NEEDLE 25G X 1"" 3 ML MISC": 11 refills | Status: AC

## 2022-01-28 MED ORDER — CYANOCOBALAMIN 1000 MCG/ML IJ SOLN: 1000.0000 ug | INTRAMUSCULAR | 2 refills | Status: DC

## 2022-01-28 NOTE — Patient Instructions (Signed)
-  Nice seeing you today!!  -Lab work today; will notify you once results are available.  -Remember your COVID and shingles vaccines at the pharmacy.  -Schedule follow up in 6 months.

## 2022-01-28 NOTE — Progress Notes (Signed)
Established Patient Office Visit     CC/Reason for Visit: Annual preventive exam and subsequent Medicare wellness visit  HPI: [name]. is a 56 y.o. male who is coming in today for the above mentioned reasons. Past Medical History is significant for: Neurosarcoidosis followed by [facility].  History of hypertrophic obstructive cardiomyopathy followed locally by [name].  Chronic pain syndrome.  Hypertension, hyperlipidemia, sleep apnea, prior history of prostate cancer, B12 deficiency.  Depression.  He feels well without complaints.  He has routine eye and dental care.  He is overdue for shingles, flu and COVID vaccines.  He had a colonoscopy in 2019.  He quit smoking 3 years ago but still vapes.   Past Medical/Surgical History: Past Medical History:  Diagnosis Date   Allergy    Anginal pain ([place])    s/p cardiac cath 07/19/13 showing NL coronaries, EF 50-55%   Anxiety    Asthma    as child   B12 DEFICIENCY 03/06/2007   Cancer ([place])    prostate ca   CARDIOMYOPATHY, IDIOPATHIC HYPERTROPHIC 03/06/2007   Clotting disorder ([place])    knee    DEPRESSION 03/06/2007   DVT (deep venous thrombosis) (HCC)    Dysrhythmia    sees [name] for "palpitations"   GERD 06/01/2007   History of kidney stones    HYPERCHOLESTEROLEMIA 03/06/2007   HYPERTENSION 03/06/2007   [name]   IMPAIRED GLUCOSE TOLERANCE 11/08/2008   NEPHROLITHIASIS, HX OF 04/07/2010   NEUROSARCOIDOSIS 03/06/2007   OSTEOARTHRITIS 06/01/2007   Palpitations 10/23/2007   Peripheral vascular disease ([place]) 03   dvt   Pneumonia    hx of   Primary idiopathic hypertrophic cardiomyopathy ([place])    Prostate cancer ([place])    SLEEP APNEA 03/06/2007   uses vpap, last sleep study 2011, Dr. Alanson Puls, at [facility], [place] 03/06/2007    Past Surgical History:  Procedure Laterality Date   CARDIAC CATHETERIZATION     decompression and fusion     cervicothoracic   EYE SURGERY Bilateral     blepharoplasty   LEFT HEART CATHETERIZATION WITH CORONARY ANGIOGRAM N/A 07/19/2013   Procedure: LEFT HEART CATHETERIZATION WITH CORONARY ANGIOGRAM;  Surgeon: Jolaine Artist, MD;  Location: [facility] CATH LAB;  Service: Cardiovascular;  Laterality: N/A;   LEG SKIN LESION  BIOPSY / EXCISION     left leg   LUMBAR FUSION     PARTIAL NEPHRECTOMY Left    bx   PENILE PROSTHESIS IMPLANT     PROSTATE SURGERY     robotic prostatectomy   SPINAL CORD STIMULATOR INSERTION N/A 04/11/2014   Procedure: LUMBAR SPINAL CORD STIMULATOR INSERTION;  Surgeon: Newman Pies, MD;  Location: [facility] NEURO ORS;  Service: Neurosurgery;  Laterality: N/A;   TONSILLECTOMY     UPPER GASTROINTESTINAL ENDOSCOPY  2009    Social History:  reports that he quit smoking about 38 years ago. His smoking use included cigarettes. He has a 1.50 pack-year smoking history. He has never used smokeless tobacco. He reports that he does not drink alcohol and does not use drugs.  Allergies: Allergies  Allergen Reactions   Adhesive [Tape] Other (See Comments)    Skin irritation   Pollen Extract Other (See Comments)    Stuffy nose    Family History:  Family History  Problem Relation Age of Onset   Heart disease Mother    Kidney disease Neg Hx        family hx  Cancer Neg Hx        family hx prostate   Colon cancer Neg Hx    Colon polyps Neg Hx    Rectal cancer Neg Hx    Stomach cancer Neg Hx      Current Outpatient Medications:    albuterol (PROVENTIL HFA;VENTOLIN HFA) 108 (90 Base) MCG/ACT inhaler, Inhale 2 puffs into the lungs 2 (two) times daily as needed for wheezing or shortness of breath., Disp: 18 g, Rfl: 1   amitriptyline (ELAVIL) 25 MG tablet, TAKE 1 TABLET (25 MG TOTAL) BY MOUTH AT BEDTIME., Disp: 30 tablet, Rfl: 5   atenolol (TENORMIN) 50 MG tablet, TAKE 1 TABLET BY MOUTH ONCE DAILY **NEED  OFFICE  VISIT, Disp: 30 tablet, Rfl: 11   atorvastatin (LIPITOR) 40 MG tablet, Take 1 tablet (40 mg total) by mouth daily.,  Disp: 90 tablet, Rfl: 3   azaTHIOprine (IMURAN) 50 MG tablet, Take 250 mg by mouth daily. , Disp: , Rfl:    buPROPion (WELLBUTRIN XL) 300 MG 24 hr tablet, Take 1 tablet (300 mg total) by mouth every morning., Disp: 90 tablet, Rfl: 1   cyclobenzaprine (FLEXERIL) 10 MG tablet, Take 1 tablet by mouth daily as needed., Disp: , Rfl:    cycloSPORINE (RESTASIS) 0.05 % ophthalmic emulsion, Place 2 drops into both eyes 2 (two) times daily., Disp: , Rfl:    dicyclomine (BENTYL) 10 MG capsule, TAKE 1 CAPSULE BY MOUTH 4 TIMES DAILY, Disp: 360 capsule, Rfl: 0   DULoxetine (CYMBALTA) 30 MG capsule, Take 1 capsule (30 mg total) by mouth daily., Disp: 90 capsule, Rfl: 0   DULoxetine (CYMBALTA) 60 MG capsule, Take 1 capsule (60 mg total) by mouth daily., Disp: 90 capsule, Rfl: 1   erythromycin ophthalmic ointment, Place 1 application into both eyes at bedtime., Disp: , Rfl:    fluticasone (FLONASE) 50 MCG/ACT nasal spray, Place 1 spray into both nostrils daily., Disp: 16 g, Rfl: 0   guaiFENesin (MUCINEX) 600 MG 12 hr tablet, Take 600 mg by mouth 2 (two) times daily. , Disp: , Rfl:    hydrocortisone (ANUSOL-HC) 2.5 % rectal cream, Place 1 application rectally 3 (three) times daily., Disp: 30 g, Rfl: 1   lidocaine (XYLOCAINE) 5 % ointment, Apply 1 application topically daily as needed for moderate pain. , Disp: , Rfl:    NEXIUM 40 MG capsule, Take 1 capsule (40 mg total) by mouth daily., Disp: 90 capsule, Rfl: 1   nortriptyline (PAMELOR) 25 MG capsule, Take 25 mg by mouth at bedtime., Disp: , Rfl:    oxyCODONE-acetaminophen (PERCOCET) 10-325 MG tablet, Take 1 tablet by mouth See admin instructions. Take 1 tablet by mouth every morning and 1 tablet before 6pm, Disp: , Rfl:    Polyethyl Glycol-Propyl Glycol (SYSTANE PRESERVATIVE FREE OP), Place 1 drop into both eyes 2 (two) times daily., Disp: , Rfl:    triamcinolone (NASACORT AQ) 55 MCG/ACT AERO nasal inhaler, Place 1 spray into the nose daily as needed (dry mouth).,  Disp: 1 Inhaler, Rfl: 2   triamcinolone ointment (KENALOG) 0.1 %, Apply 1 application topically daily. , Disp: , Rfl:    cyanocobalamin (,VITAMIN B-12,) 1000 MCG/ML injection, Inject 1 mL (1,000 mcg total) into the muscle every 30 (thirty) days., Disp: 6 mL, Rfl: 2   gabapentin (NEURONTIN) 300 MG capsule, TAKE ONE CAPSULE BY MOUTH FOUR TIMES A DAY, Disp: 360 capsule, Rfl: 0   SYRINGE-NEEDLE, DISP, 3 ML (BD SAFETYGLIDE SYRINGE/NEEDLE) 25G X 1" 3 ML MISC, Use for  B12 injections, Disp: 100 each, Rfl: 11  Current Facility-Administered Medications:    0.9 %  sodium chloride infusion, 500 mL, Intravenous, Once, Danis, Estill Cotta III, MD  Review of Systems:  Constitutional: Denies fever, chills, diaphoresis, appetite change and fatigue.  HEENT: Denies photophobia, eye pain, redness, hearing loss, ear pain, congestion, sore throat, rhinorrhea, sneezing, mouth sores, trouble swallowing, neck pain, neck stiffness and tinnitus.   Respiratory: Denies SOB, DOE, cough, chest tightness,  and wheezing.   Cardiovascular: Denies chest pain, palpitations and leg swelling.  Gastrointestinal: Denies nausea, vomiting, abdominal pain, diarrhea, constipation, blood in stool and abdominal distention.  Genitourinary: Denies dysuria, urgency, frequency, hematuria, flank pain and difficulty urinating.  Endocrine: Denies: hot or cold intolerance, sweats, changes in hair or nails, polyuria, polydipsia. Musculoskeletal: Denies myalgias, back pain, joint swelling, arthralgias and gait problem.  Skin: Denies pallor, rash and wound.  Neurological: Denies dizziness, seizures, syncope, weakness, light-headedness, numbness and headaches.  Hematological: Denies adenopathy. Easy bruising, personal or family bleeding history  Psychiatric/Behavioral: Denies suicidal ideation, mood changes, confusion, nervousness, sleep disturbance and agitation    Physical Exam: Vitals:   01/28/22 0751  BP: 110/80  Pulse: (!) 55  Temp: 98.4 F  (36.9 C)  TempSrc: Oral  SpO2: 99%  Weight: 222 lb 6.4 oz (100.9 kg)  Height: '5\' 9"'$  (1.753 m)    Body mass index is 32.84 kg/m.   Constitutional: NAD, calm, comfortable Eyes: PERRL, lids and conjunctivae normal ENMT: Mucous membranes are moist. Posterior pharynx clear of any exudate or lesions. Normal dentition. Tympanic membrane is pearly white, no erythema or bulging. Neck: normal, supple, no masses, no thyromegaly Respiratory: clear to auscultation bilaterally, no wheezing, no crackles. Normal respiratory effort. No accessory muscle use.  Cardiovascular: Regular rate and rhythm, no murmurs / rubs / gallops. No extremity edema. 2+ pedal pulses. No carotid bruits.  Abdomen: no tenderness, no masses palpated. No hepatosplenomegaly. Bowel sounds positive.  Musculoskeletal: no clubbing / cyanosis. No joint deformity upper and lower extremities. Good ROM, no contractures. Normal muscle tone.  Skin: no rashes, lesions, ulcers. No induration Neurologic: CN 2-12 grossly intact. Sensation intact, DTR normal. Strength 5/5 in all 4.  Psychiatric: Normal judgment and insight. Alert and oriented x 3. Normal mood.    Subsequent Medicare wellness visit   1. Risk factors, based on past  M,S,F -cardiovascular disease risk factors include age, gender, history of hypertension and hyperlipidemia   2.  Physical activities: Sedentary   3.  Depression/mood: History of depression but mood is stable   4.  Hearing: No perceived issues   5.  ADL's: Independent in all ADLs   6.  Fall risk: Low fall risk   7.  Home safety: No problems identified   8.  Height weight, and visual acuity: height and weight as above, vision:  Vision Screening   Right eye Left eye Both eyes  Without correction 20/30  20/20  With correction     Comments: Unable to see out of left eye    9.  Counseling: Advised to update age-appropriate vaccinations and increase physical activity   10. Lab orders based on risk  factors: Laboratory update will be reviewed   11. Referral : For lung cancer screening   12. Care plan: Follow-up with me in 6 months   13. Cognitive assessment: No cognitive impairment   14. Screening: Patient provided with a written and personalized 5-10 year screening schedule in the AVS. yes   15. Provider List Update: PCP,  cardiology, neurology, rheumatology  16. Advance Directives: Full code   17. Opioids: Patient is on chronic opioids. She has a signed pain contract with me. Has not displayed any signs of an opioid-use disorder.   [facility] Office Visit from 01/28/2022 in [facility]  PHQ-9 Total Score 6          08/26/2017    2:44 PM 08/04/2018    2:56 AM 09/27/2018    8:39 AM 12/28/2019    8:35 AM 01/28/2022    8:04 AM  [place] in the past year? No  0 0 0  Was there an injury with Fall?    0 0  Fall Risk Category Calculator    0 0  Fall Risk Category    Low Low  Patient Fall Risk Level  Low fall risk   Low fall risk  Patient at Risk for Falls Due to     No Fall Risks  Fall risk Follow up     Falls evaluation completed     Impression and Plan:  Encounter for preventive health examination -Recommend routine eye and dental care. -Immunizations: Overdue for COVID, flu, shingles, he will obtain at pharmacy -Healthy lifestyle discussed in detail. -Labs to be updated today. -Colon cancer screening: 01/2018 -Breast cancer screening: Not applicable -Cervical cancer screening: Not applicable -Lung cancer screening: Referral placed for screening -Prostate cancer screening: History of prostate cancer, check PSA -DEXA: Not applicable  X32 deficiency  - Plan: SYRINGE-NEEDLE, DISP, 3 ML (BD SAFETYGLIDE SYRINGE/NEEDLE) 25G X 1" 3 ML MISC, cyanocobalamin (,VITAMIN B-12,) 1000 MCG/ML injection  Hypertrophic obstructive cardiomyopathy (HCC) -Followed by cardiology  NEUROSARCOIDOSIS  - Plan: gabapentin (NEURONTIN) 300 MG capsule  Pure  hypercholesterolemia  - Plan: Lipid panel  Essential hypertension  - Plan: CBC with Differential/Platelet, Comprehensive metabolic panel, Hemoglobin A1c  CA of prostate ([place])  - Plan: PSA  Depression, recurrent ([place])  - Plan: TSH, Vitamin B12, VITAMIN D 25 Hydroxy (Vit-D Deficiency, Fractures) -Mood is stable  Cigarette nicotine dependence without complication  - Plan: Ambulatory Referral Lung Cancer Screening [facility]     Patient Instructions  -Nice seeing you today!!  -Lab work today; will notify you once results are available.  -Remember your COVID and shingles vaccines at the pharmacy.  -Schedule follow up in 6 months.      Lelon Frohlich, MD [facility]

## 2022-01-29 ENCOUNTER — Telehealth: Payer: Self-pay | Admitting: Internal Medicine

## 2022-01-29 NOTE — Telephone Encounter (Signed)
Pt is calling and vit b suppose to be per pt once a wk for 4 wk then once a  1 month. cyanocobalamin (,VITAMIN B-12,) 1000 MCG/ML injection. Pt is aware md out of office until 02-02-2022  [place], [place] Phone:  [phone]  Fax:  [phone]

## 2022-01-29 NOTE — Telephone Encounter (Signed)
Spoke to pharmacist(Victor) to clarify and verbal order on the instruction for patient to take 27m of b12 wkly for 4 wks then 165mmonthly.

## 2022-02-18 ENCOUNTER — Other Ambulatory Visit: Payer: Self-pay | Admitting: Internal Medicine

## 2022-02-18 DIAGNOSIS — E538 Deficiency of other specified B group vitamins: Secondary | ICD-10-CM

## 2022-06-21 ENCOUNTER — Emergency Department (HOSPITAL_BASED_OUTPATIENT_CLINIC_OR_DEPARTMENT_OTHER): Payer: Self-pay

## 2022-06-21 ENCOUNTER — Other Ambulatory Visit: Payer: Self-pay

## 2022-06-21 ENCOUNTER — Emergency Department (HOSPITAL_BASED_OUTPATIENT_CLINIC_OR_DEPARTMENT_OTHER)
Admission: EM | Admit: 2022-06-21 | Discharge: 2022-06-21 | Disposition: A | Discharge status: Home / Self Care | Payer: Self-pay | Attending: Emergency Medicine | Admitting: Emergency Medicine

## 2022-06-21 ENCOUNTER — Encounter (HOSPITAL_BASED_OUTPATIENT_CLINIC_OR_DEPARTMENT_OTHER): Payer: Self-pay | Admitting: Emergency Medicine

## 2022-06-21 DIAGNOSIS — R110 Nausea: Secondary | ICD-10-CM | POA: Insufficient documentation

## 2022-06-21 DIAGNOSIS — R1030 Lower abdominal pain, unspecified: Secondary | ICD-10-CM

## 2022-06-21 DIAGNOSIS — R1031 Right lower quadrant pain: Secondary | ICD-10-CM | POA: Insufficient documentation

## 2022-06-21 DIAGNOSIS — R1032 Left lower quadrant pain: Secondary | ICD-10-CM | POA: Insufficient documentation

## 2022-06-21 LAB — COMPREHENSIVE METABOLIC PANEL
ALT: 16 U/L (ref 0–44)
AST: 21 U/L (ref 15–41)
Albumin: 4.7 g/dL (ref 3.5–5.0)
Alkaline Phosphatase: 38 U/L (ref 38–126)
Anion gap: 8 (ref 5–15)
BUN: 13 mg/dL (ref 6–20)
CO2: 25 mmol/L (ref 22–32)
Calcium: 9.5 mg/dL (ref 8.9–10.3)
Chloride: 105 mmol/L (ref 98–111)
Creatinine, Ser: 0.84 mg/dL (ref 0.61–1.24)
GFR, Estimated: >60 mL/min (ref >60)
Glucose, Bld: 104 mg/dL — ABNORMAL HIGH (ref 70–99)
Potassium: 3.8 mmol/L (ref 3.5–5.1)
Sodium: 138 mmol/L (ref 135–145)
Total Bilirubin: 0.6 mg/dL (ref 0.3–1.2)
Total Protein: 7.6 g/dL (ref 6.5–8.1)

## 2022-06-21 LAB — URINALYSIS, ROUTINE W REFLEX MICROSCOPIC
Bilirubin Urine: NEGATIVE
Glucose, UA: NEGATIVE mg/dL
Hgb urine dipstick: NEGATIVE
Ketones, ur: NEGATIVE mg/dL
Leukocytes,Ua: NEGATIVE
Nitrite: NEGATIVE
Protein, ur: NEGATIVE mg/dL
Specific Gravity, Urine: 1.014 (ref 1.005–1.030)
pH: 6 (ref 5.0–8.0)

## 2022-06-21 LAB — CBC
HCT: 39.1 % (ref 39.0–52.0)
Hemoglobin: 13.2 g/dL (ref 13.0–17.0)
MCH: 30.6 pg (ref 26.0–34.0)
MCHC: 33.8 g/dL (ref 30.0–36.0)
MCV: 90.5 fL (ref 80.0–100.0)
Platelets: 282 10*3/uL (ref 150–400)
RBC: 4.32 MIL/uL (ref 4.22–5.81)
RDW: 13.8 % (ref 11.5–15.5)
WBC: 3 10*3/uL — ABNORMAL LOW (ref 4.0–10.5)
nRBC: 0 % (ref 0.0–0.2)

## 2022-06-21 LAB — LIPASE, BLOOD: Lipase: 33 U/L (ref 11–51)

## 2022-06-21 MED ORDER — OXYCODONE HCL 5 MG PO TABS
5.0000 mg | ORAL_TABLET | Freq: Once | ORAL | Status: AC
  Administered 2022-06-21: 5 mg via ORAL
  Filled 2022-06-21: qty 1

## 2022-06-21 MED ORDER — OXYCODONE HCL 5 MG PO TABS: 5.0000 mg | ORAL_TABLET | Freq: Four times a day (QID) | ORAL | 0 refills | Status: DC | PRN

## 2022-06-21 MED ORDER — IOHEXOL 300 MG/ML  SOLN
100.0000 mL | Freq: Once | INTRAMUSCULAR | Status: AC | PRN
  Administered 2022-06-21: 80 mL via INTRAVENOUS

## 2022-06-21 MED ORDER — ONDANSETRON 4 MG PO TBDP
4.0000 mg | ORAL_TABLET | Freq: Once | ORAL | Status: AC
  Administered 2022-06-21: 4 mg via ORAL
  Filled 2022-06-21: qty 1

## 2022-06-21 NOTE — Discharge Instructions (Signed)
Today you had stable blood work including a liver profile, pancreatic panel, and renal function.  There was no signs or symptoms of sepsis.  Your CT scan of the abdomen and pelvis with IV contrast demonstrated no bowel obstruction, no evidence of perforation, no infection of the small bowel or colon, no appendicitis, no other etiologies for acute abdominal pain.  Your urine analysis demonstrated no urinary tract infection.  You are not retaining any urine.    Please follow-up with your primary care physician in the next 3 to 5 days if abdominal pain does not improve.

## 2022-06-21 NOTE — ED Provider Notes (Signed)
[facility] EMERGENCY DEPT Provider Note   CSN: [id number] Arrival date & time: 06/21/22  1804     History  Chief Complaint  Patient presents with   Abdominal Pain    [name]. is a 56 y.o. male.  Pt is a 56 yo male presenting for abdominal pain. Pt admits to abdominal pain across the lower abdomen without radiation x 3. Pt admits to associated nausea today without vomiting. Denies fevers or diarrhea. Denies black or bloody stools. Admits to pressure with urinating.   The history is provided by the patient. No language interpreter was used.  Abdominal Pain Associated symptoms: nausea   Associated symptoms: no chest pain, no chills, no cough, no dysuria, no fever, no hematuria, no shortness of breath, no sore throat and no vomiting        Home Medications Prior to Admission medications   Medication Sig Start Date End Date Taking? Authorizing Provider  oxyCODONE (ROXICODONE) 5 MG immediate release tablet Take 1 tablet (5 mg total) by mouth every 6 (six) hours as needed for severe pain. 47/09/62  Yes Campbell Stall P, DO  albuterol (PROVENTIL HFA;VENTOLIN HFA) 108 (90 Base) MCG/ACT inhaler Inhale 2 puffs into the lungs 2 (two) times daily as needed for wheezing or shortness of breath. 10/05/18   Isaac Bliss, Rayford Halsted, MD  amitriptyline (ELAVIL) 25 MG tablet TAKE 1 TABLET (25 MG TOTAL) BY MOUTH AT BEDTIME. 12/29/15   Marletta Lor, MD  atenolol (TENORMIN) 50 MG tablet TAKE 1 TABLET BY MOUTH ONCE DAILY **NEED  OFFICE  VISIT 11/23/21   Lelon Perla, MD  atorvastatin (LIPITOR) 40 MG tablet Take 1 tablet (40 mg total) by mouth daily. 06/06/18   Lyda Jester M, PA-C  azaTHIOprine (IMURAN) 50 MG tablet Take 250 mg by mouth daily.     [provider]  buPROPion (WELLBUTRIN XL) 300 MG 24 hr tablet Take 1 tablet (300 mg total) by mouth every morning. 10/05/18   Isaac Bliss, Rayford Halsted, MD  cyanocobalamin (,VITAMIN B-12,) 1000 MCG/ML injection  INJECT 1 ML (CC) INTRAMUSCULARLY ONCE WEEKLY 02/18/22   Isaac Bliss, Rayford Halsted, MD  cyclobenzaprine (FLEXERIL) 10 MG tablet Take 1 tablet by mouth daily as needed. 06/06/13   [provider]  cycloSPORINE (RESTASIS) 0.05 % ophthalmic emulsion Place 2 drops into both eyes 2 (two) times daily.    [provider]  dicyclomine (BENTYL) 10 MG capsule TAKE 1 CAPSULE BY MOUTH 4 TIMES DAILY 07/28/15   Marletta Lor, MD  DULoxetine (CYMBALTA) 30 MG capsule Take 1 capsule (30 mg total) by mouth daily. 11/24/17   Marletta Lor, MD  DULoxetine (CYMBALTA) 60 MG capsule Take 1 capsule (60 mg total) by mouth daily. 10/05/18   Isaac Bliss, Rayford Halsted, MD  erythromycin ophthalmic ointment Place 1 application into both eyes at bedtime. 04/15/14   [provider]  fluticasone (FLONASE) 50 MCG/ACT nasal spray Place 1 spray into both nostrils daily. 09/19/20   Billie Ruddy, MD  gabapentin (NEURONTIN) 300 MG capsule TAKE ONE CAPSULE BY MOUTH FOUR TIMES A DAY 01/28/22   Isaac Bliss, Rayford Halsted, MD  guaiFENesin (MUCINEX) 600 MG 12 hr tablet Take 600 mg by mouth 2 (two) times daily.     [provider]  hydrocortisone (ANUSOL-HC) 2.5 % rectal cream Place 1 application rectally 3 (three) times daily. 09/08/18   Cirigliano, Garvin Fila, DO  lidocaine (XYLOCAINE) 5 % ointment Apply 1 application topically daily as needed for moderate  pain.     [provider]  NEXIUM 40 MG capsule Take 1 capsule (40 mg total) by mouth daily. 10/05/18   Isaac Bliss, Rayford Halsted, MD  nortriptyline (PAMELOR) 25 MG capsule Take 25 mg by mouth at bedtime. 09/14/18   [provider]  oxyCODONE-acetaminophen (PERCOCET) 10-325 MG tablet Take 1 tablet by mouth See admin instructions. Take 1 tablet by mouth every morning and 1 tablet before 6pm    [provider]  Polyethyl Glycol-Propyl Glycol (SYSTANE PRESERVATIVE FREE OP) Place 1 drop into both eyes 2 (two) times daily.     [provider]  SYRINGE-NEEDLE, DISP, 3 ML (BD SAFETYGLIDE SYRINGE/NEEDLE) 25G X 1" 3 ML MISC Use for B12 injections 01/28/22   Isaac Bliss, Rayford Halsted, MD  triamcinolone (NASACORT AQ) 55 MCG/ACT AERO nasal inhaler Place 1 spray into the nose daily as needed (dry mouth). 10/05/18   Isaac Bliss, Rayford Halsted, MD  triamcinolone ointment (KENALOG) 0.1 % Apply 1 application topically daily.     [provider]      Allergies    Adhesive [tape] and Pollen extract    Review of Systems   Review of Systems  Constitutional:  Negative for chills and fever.  HENT:  Negative for ear pain and sore throat.   Eyes:  Negative for pain and visual disturbance.  Respiratory:  Negative for cough and shortness of breath.   Cardiovascular:  Negative for chest pain and palpitations.  Gastrointestinal:  Positive for abdominal pain and nausea. Negative for vomiting.  Genitourinary:  Negative for dysuria and hematuria.  Musculoskeletal:  Negative for arthralgias and back pain.  Skin:  Negative for color change and rash.  Neurological:  Negative for seizures and syncope.  All other systems reviewed and are negative.   Physical Exam Updated Vital Signs BP 127/88 (BP Location: Right Arm)   Pulse (!) 57   Temp 97.8 F (36.6 C) (Oral)   Resp 18   SpO2 100%  Physical Exam Vitals and nursing note reviewed.  Constitutional:      General: He is not in acute distress.    Appearance: He is well-developed.  HENT:     Head: Normocephalic and atraumatic.  Eyes:     Conjunctiva/sclera: Conjunctivae normal.  Cardiovascular:     Rate and Rhythm: Normal rate and regular rhythm.     Heart sounds: No murmur heard. Pulmonary:     Effort: Pulmonary effort is normal. No respiratory distress.     Breath sounds: Normal breath sounds.  Abdominal:     Palpations: Abdomen is soft.     Tenderness: There is abdominal tenderness in the right lower quadrant, suprapubic area and left lower quadrant. There  is no guarding or rebound.  Musculoskeletal:        General: No swelling.     Cervical back: Neck supple.  Skin:    General: Skin is warm and dry.     Capillary Refill: Capillary refill takes less than 2 seconds.  Neurological:     Mental Status: He is alert.  Psychiatric:        Mood and Affect: Mood normal.     ED Results / Procedures / Treatments   Labs (all labs ordered are listed, but only abnormal results are displayed) Labs Reviewed  COMPREHENSIVE METABOLIC PANEL - Abnormal; Notable for the following components:      Result Value   Glucose, Bld 104 (*)    All other components within normal limits  CBC -  Abnormal; Notable for the following components:   WBC 3.0 (*)    All other components within normal limits  LIPASE, BLOOD  URINALYSIS, ROUTINE W REFLEX MICROSCOPIC    EKG None  Radiology CT ABDOMEN PELVIS W CONTRAST  Result Date: 06/21/2022 CLINICAL DATA:  Left lower abdominal pain EXAM: CT ABDOMEN AND PELVIS WITH CONTRAST TECHNIQUE: Multidetector CT imaging of the abdomen and pelvis was performed using the standard protocol following bolus administration of intravenous contrast. RADIATION DOSE REDUCTION: This exam was performed according to the departmental dose-optimization program which includes automated exposure control, adjustment of the mA and/or kV according to patient size and/or use of iterative reconstruction technique. CONTRAST:  51m OMNIPAQUE IOHEXOL 300 MG/ML  SOLN COMPARISON:  04/19/2014 FINDINGS: Lower chest: No acute abnormality Hepatobiliary: No focal hepatic abnormality. Small layering gallstones in the gallbladder. Pancreas: No focal abnormality or ductal dilatation. Spleen: No focal abnormality.  Normal size. Adrenals/Urinary Tract: 3.7 cm right midpole to lower pole renal cyst. No follow-up imaging recommended. No hydronephrosis. Adrenal glands and urinary bladder unremarkable. Stomach/Bowel: Normal appendix. Stomach, large and small bowel grossly  unremarkable. Vascular/Lymphatic: No evidence of aneurysm or adenopathy. Reproductive: Penile implant noted. Other: No free fluid or free air. Musculoskeletal: No acute bony abnormality. Postoperative changes in the lower lumbar spine. IMPRESSION: No acute findings in the abdomen or pelvis. Electronically Signed   By: KRolm BaptiseM.D.   On: 06/21/2022 22:07    Procedures Procedures    Medications Ordered in ED Medications  iohexol (OMNIPAQUE) 300 MG/ML solution 100 mL (80 mLs Intravenous Contrast Given 06/21/22 2148)  ondansetron (ZOFRAN-ODT) disintegrating tablet 4 mg (4 mg Oral Given 06/21/22 2316)  oxyCODONE (Oxy IR/ROXICODONE) immediate release tablet 5 mg (5 mg Oral Given 06/21/22 2316)    ED Course/ Medical Decision Making/ A&P                           Medical Decision Making Amount and/or Complexity of Data Reviewed Labs: ordered.  Risk Prescription drug management.   11:45 PM  Differential diagnosis includes but is not limited to constipation, diverticulitis, bowel obstruction, urinary tract infection, urinary retention, etc.  56yo male presenting for abdominal pain.  Patient is alert and oriented x3, no acute distress, afebrile, stable vital signs.  Physical exam demonstrates no guarding or rigidity.  Soft abdomen.  Tenderness to palpation of the left lower quadrant, right lower quadrant, and suprapubic region.  Patient has stable liver profile, lipase, and renal function.  No signs or symptoms of sepsis.  No leukocytosis.  CT scan of the abdomen and pelvis with IV contrast scan demonstrates no acute process.  No bowel obstruction, colitis, diverticulitis, evidence of perforation, or appendicitis.  Urine analysis demonstrates no urinary tract infection.  Postvoid residual volume demonstrates 1 mL of fluid only.  No retention.  Unclear etiology of lower abdominal pain at this time.  Patient given medication for pain control and sent for follow-up with primary care  physician.  Patient in no distress and overall condition improved here in the ED. Detailed discussions were had with the patient regarding current findings, and need for close f/u with PCP or on call doctor. The patient has been instructed to return immediately if the symptoms worsen in any way for re-evaluation. Patient verbalized understanding and is in agreement with current care plan. All questions answered prior to discharge.         Final Clinical Impression(s) / ED Diagnoses Final diagnoses:  Lower abdominal pain    Rx / DC Orders ED Discharge Orders          Ordered    oxyCODONE (ROXICODONE) 5 MG immediate release tablet  Every 6 hours PRN        06/21/22 2343              Lianne Cure, DO 33/17/40 2345

## 2022-06-21 NOTE — ED Provider Triage Note (Signed)
Emergency Medicine Provider Triage Evaluation Note  [name]. , a 56 y.o. male  was evaluated in triage.  Pt complains of lower abdominal pain    Review of Systems  Positive: Lower abdominal pain  Negative: fever  Physical Exam  BP 130/89   Pulse (!) 55   Temp 98 F (36.7 C)   Resp 18   SpO2 100%  Gen:   Awake, no distress   Resp:  Normal effort  MSK:   Moves extremities without difficulty Tender right lower abdomen to palpation,  tender suprapubic  Other:    Medical Decision Making  Medically screening exam initiated at 9:28 PM.  Appropriate orders placed.  [name] was informed that the remainder of the evaluation will be completed by another provider, this initial triage assessment does not replace that evaluation, and the importance of remaining in the ED until their evaluation is complete.     Fransico Meadow, [place] 06/21/22 2129

## 2022-06-23 ENCOUNTER — Encounter: Payer: Self-pay | Admitting: Internal Medicine

## 2022-06-23 ENCOUNTER — Other Ambulatory Visit (HOSPITAL_COMMUNITY)
Admission: RE | Admit: 2022-06-23 | Discharge: 2022-06-23 | Disposition: A | Discharge status: Home / Self Care | Payer: Self-pay | Source: Ambulatory Visit | Attending: Internal Medicine | Admitting: Internal Medicine

## 2022-06-23 ENCOUNTER — Ambulatory Visit (INDEPENDENT_AMBULATORY_CARE_PROVIDER_SITE_OTHER): Payer: Self-pay | Admitting: Internal Medicine

## 2022-06-23 VITALS — BP 110/80 | Temp 98.5°F | Wt 222.3 lb

## 2022-06-23 DIAGNOSIS — R300 Dysuria: Secondary | ICD-10-CM | POA: Insufficient documentation

## 2022-06-23 DIAGNOSIS — E538 Deficiency of other specified B group vitamins: Secondary | ICD-10-CM

## 2022-06-23 DIAGNOSIS — Z09 Encounter for follow-up examination after completed treatment for conditions other than malignant neoplasm: Secondary | ICD-10-CM

## 2022-06-23 DIAGNOSIS — E559 Vitamin D deficiency, unspecified: Secondary | ICD-10-CM

## 2022-06-23 DIAGNOSIS — Z23 Encounter for immunization: Secondary | ICD-10-CM

## 2022-06-23 LAB — POCT URINALYSIS DIPSTICK
Bilirubin, UA: NEGATIVE
Blood, UA: NEGATIVE
Glucose, UA: NEGATIVE
Ketones, UA: NEGATIVE
Leukocytes, UA: NEGATIVE
Nitrite, UA: NEGATIVE
Protein, UA: NEGATIVE
Spec Grav, UA: 1.02 (ref 1.010–1.025)
Urobilinogen, UA: 0.2 E.U./dL
pH, UA: 6 (ref 5.0–8.0)

## 2022-06-23 LAB — VITAMIN D 25 HYDROXY (VIT D DEFICIENCY, FRACTURES): VITD: 26.68 ng/mL — ABNORMAL LOW (ref 30.00–100.00)

## 2022-06-23 LAB — VITAMIN B12: Vitamin B-12: 768 pg/mL (ref 211–911)

## 2022-06-23 NOTE — Progress Notes (Signed)
Established Patient Office Visit     CC/Reason for Visit: ED follow-up  HPI: [name]. is a 56 y.o. male who is coming in today for the above mentioned reasons.  He was seen in the emergency department on August 16 with suprapubic pain and urinary pressure and dysuria.  All labs including a CT scan of the abdomen were within normal limits.  He was discharged and asked to follow-up with me.  His bowel movements have been regular.  He continues to complain of pressure and pain with urination.  He does not have any penile discharge.  Past Medical/Surgical History: Past Medical History:  Diagnosis Date   Allergy    Anginal pain ([place])    s/p cardiac cath 07/19/13 showing NL coronaries, EF 50-55%   Anxiety    Asthma    as child   B12 DEFICIENCY 03/06/2007   Cancer ([place])    prostate ca   CARDIOMYOPATHY, IDIOPATHIC HYPERTROPHIC 03/06/2007   Clotting disorder ([place])    knee    DEPRESSION 03/06/2007   DVT (deep venous thrombosis) (HCC)    Dysrhythmia    sees [name] for "palpitations"   GERD 06/01/2007   History of kidney stones    HYPERCHOLESTEROLEMIA 03/06/2007   HYPERTENSION 03/06/2007   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 Alanson Puls, at [facility], CHRONIC PAIN 03/06/2007    Past Surgical History:  Procedure Laterality Date   CARDIAC CATHETERIZATION     decompression and fusion     cervicothoracic   ELBOW SURGERY Left    EYE SURGERY Bilateral    blepharoplasty   HAND SURGERY Left    LEFT HEART CATHETERIZATION WITH CORONARY ANGIOGRAM N/A 07/19/2013   Procedure: LEFT HEART CATHETERIZATION WITH CORONARY  ANGIOGRAM;  Surgeon: Jolaine Artist, MD;  Location: [facility] CATH LAB;  Service: Cardiovascular;  Laterality: N/A;   LEG SKIN LESION  BIOPSY / EXCISION     left leg   LUMBAR FUSION     PARTIAL NEPHRECTOMY Left    bx   PENILE PROSTHESIS IMPLANT     PROSTATE SURGERY     robotic prostatectomy   SPINAL CORD STIMULATOR INSERTION N/A 04/11/2014   Procedure: LUMBAR SPINAL CORD STIMULATOR INSERTION;  Surgeon: Newman Pies, MD;  Location: [facility];  Service: Neurosurgery;  Laterality: N/A;   TONSILLECTOMY     UPPER GASTROINTESTINAL ENDOSCOPY  2009    Social History:  reports that he quit smoking about 38 years ago. His smoking use included cigarettes. He has a 1.50 pack-year smoking history. He has never used smokeless tobacco. He reports that he does not drink alcohol and does not use drugs.  Allergies: Allergies  Allergen Reactions   Adhesive [Tape] Other (See Comments)    Skin irritation   Pollen Extract Other (See Comments)    Stuffy nose    Family History:  Family History  Problem Relation Age of Onset   Heart disease Mother    Kidney disease Neg Hx        family hx    Cancer Neg Hx  family hx prostate   Colon cancer Neg Hx    Colon polyps Neg Hx    Rectal cancer Neg Hx    Stomach cancer Neg Hx      Current Outpatient Medications:    albuterol (PROVENTIL HFA;VENTOLIN HFA) 108 (90 Base) MCG/ACT inhaler, Inhale 2 puffs into the lungs 2 (two) times daily as needed for wheezing or shortness of breath., Disp: 18 g, Rfl: 1   atenolol (TENORMIN) 50 MG tablet, TAKE 1 TABLET BY MOUTH ONCE DAILY **NEED  OFFICE  VISIT, Disp: 30 tablet, Rfl: 11   azaTHIOprine (IMURAN) 50 MG tablet, Take 250 mg by mouth daily. , Disp: , Rfl:    cyanocobalamin (,VITAMIN B-12,) 1000 MCG/ML injection, INJECT 1 ML (CC) INTRAMUSCULARLY ONCE WEEKLY, Disp: 4 mL, Rfl: 0   cycloSPORINE (RESTASIS) 0.05 % ophthalmic emulsion, Place 2 drops into both eyes 2 (two) times daily., Disp: , Rfl:     erythromycin ophthalmic ointment, Place 1 application into both eyes at bedtime., Disp: , Rfl:    fluticasone (FLONASE) 50 MCG/ACT nasal spray, Place 1 spray into both nostrils daily., Disp: 16 g, Rfl: 0   gabapentin (NEURONTIN) 300 MG capsule, TAKE ONE CAPSULE BY MOUTH FOUR TIMES A DAY, Disp: 360 capsule, Rfl: 0   guaiFENesin (MUCINEX) 600 MG 12 hr tablet, Take 600 mg by mouth 2 (two) times daily. , Disp: , Rfl:    hydrocortisone (ANUSOL-HC) 2.5 % rectal cream, Place 1 application rectally 3 (three) times daily., Disp: 30 g, Rfl: 1   lidocaine (XYLOCAINE) 5 % ointment, Apply 1 application topically daily as needed for moderate pain. , Disp: , Rfl:    oxyCODONE (ROXICODONE) 5 MG immediate release tablet, Take 1 tablet (5 mg total) by mouth every 6 (six) hours as needed for severe pain., Disp: 12 tablet, Rfl: 0   Polyethyl Glycol-Propyl Glycol (SYSTANE PRESERVATIVE FREE OP), Place 1 drop into both eyes 2 (two) times daily., Disp: , Rfl:    SYRINGE-NEEDLE, DISP, 3 ML (BD SAFETYGLIDE SYRINGE/NEEDLE) 25G X 1" 3 ML MISC, Use for B12 injections, Disp: 100 each, Rfl: 11   triamcinolone (NASACORT AQ) 55 MCG/ACT AERO nasal inhaler, Place 1 spray into the nose daily as needed (dry mouth)., Disp: 1 Inhaler, Rfl: 2   triamcinolone ointment (KENALOG) 0.1 %, Apply 1 application topically daily. , Disp: , Rfl:   Current Facility-Administered Medications:    0.9 %  sodium chloride infusion, 500 mL, Intravenous, Once, Danis, Estill Cotta III, MD  Review of Systems:  Constitutional: Denies fever, chills, diaphoresis, appetite change and fatigue.  HEENT: Denies photophobia, eye pain, redness, hearing loss, ear pain, congestion, sore throat, rhinorrhea, sneezing, mouth sores, trouble swallowing, neck pain, neck stiffness and tinnitus.   Respiratory: Denies SOB, DOE, cough, chest tightness,  and wheezing.   Cardiovascular: Denies chest pain, palpitations and leg swelling.  Gastrointestinal: Denies  vomiting, diarrhea,  constipation, blood in stool and abdominal distention.  Genitourinary: Denies urgency, frequency, hematuria, flank pain. Endocrine: Denies: hot or cold intolerance, sweats, changes in hair or nails, polyuria, polydipsia. Musculoskeletal: Denies myalgias, back pain, joint swelling, arthralgias and gait problem.  Skin: Denies pallor, rash and wound.  Neurological: Denies dizziness, seizures, syncope, weakness, light-headedness, numbness and headaches.  Hematological: Denies adenopathy. Easy bruising, personal or family bleeding history  Psychiatric/Behavioral: Denies suicidal ideation, mood changes, confusion, nervousness, sleep disturbance and agitation    Physical Exam: Vitals:   06/23/22 1352  BP: 110/80  Temp: 98.5 F (36.9 C)  TempSrc: Oral  SpO2:  97%  Weight: 222 lb 4.8 oz (100.8 kg)    Body mass index is 32.83 kg/m.   Constitutional: NAD, calm, comfortable Eyes: PERRL, lids and conjunctivae normal ENMT: Mucous membranes are moist.   Abdomen: no tenderness, no masses palpated. No hepatosplenomegaly. Bowel sounds positive.  Musculoskeletal: no clubbing / cyanosis. No joint deformity upper and lower extremities. Good ROM, no contractures. Normal muscle tone.  Psychiatric: Normal judgment and insight. Alert and oriented x 3. Normal mood.    Impression and Plan:  Hospital discharge follow-up  Dysuria - Plan: POCT urinalysis dipstick, Urine cytology ancillary only, HIV antibody (with reflex), RPR, Hepatitis Acute Panel  Need for influenza vaccination - Plan: Flu Vaccine QUAD 6+ mos PF IM (Fluarix Quad PF)  Vitamin B12 deficiency - Plan: Vitamin B12  Vitamin D deficiency - Plan: Vitamin D, 25-hydroxy   -ED charts reviewed in detail. CT abdomen normal. -Because of dysuria, suprapubic pressure with a normal office dipstick, will r/o STD especially GC/chlamydia.. Consider GU referral if negative. -Flu vaccine administered today  Time spent:31 minutes reviewing chart,  interviewing and examining patient and formulating plan of care.      Lelon Frohlich, MD [facility] at [facility]

## 2022-06-24 ENCOUNTER — Other Ambulatory Visit: Payer: Self-pay | Admitting: *Deleted

## 2022-06-24 ENCOUNTER — Other Ambulatory Visit: Payer: Self-pay | Admitting: Internal Medicine

## 2022-06-24 DIAGNOSIS — E559 Vitamin D deficiency, unspecified: Secondary | ICD-10-CM

## 2022-06-24 LAB — HEPATITIS PANEL, ACUTE
Hep A IgM: NONREACTIVE
Hep B C IgM: NONREACTIVE
Hepatitis B Surface Ag: NONREACTIVE
Hepatitis C Ab: NONREACTIVE

## 2022-06-24 LAB — URINE CYTOLOGY ANCILLARY ONLY
Chlamydia: NEGATIVE
Comment: NEGATIVE
Comment: NEGATIVE
Comment: NORMAL
Neisseria Gonorrhea: NEGATIVE
Trichomonas: NEGATIVE

## 2022-06-24 LAB — HIV ANTIBODY (ROUTINE TESTING W REFLEX): HIV 1&2 Ab, 4th Generation: NONREACTIVE

## 2022-06-24 LAB — RPR: RPR Ser Ql: NONREACTIVE

## 2022-06-24 MED ORDER — VITAMIN D (ERGOCALCIFEROL) 1.25 MG (50000 UNIT) PO CAPS: 50000.0000 [IU] | ORAL_CAPSULE | ORAL | 0 refills | Status: AC

## 2022-12-23 ENCOUNTER — Other Ambulatory Visit: Payer: Self-pay | Admitting: Cardiology

## 2022-12-23 DIAGNOSIS — I10 Essential (primary) hypertension: Secondary | ICD-10-CM

## 2023-02-01 ENCOUNTER — Other Ambulatory Visit: Payer: Self-pay | Admitting: Cardiology

## 2023-02-01 DIAGNOSIS — I10 Essential (primary) hypertension: Secondary | ICD-10-CM

## 2023-03-01 ENCOUNTER — Other Ambulatory Visit: Payer: Self-pay | Admitting: Cardiology

## 2023-03-01 DIAGNOSIS — I10 Essential (primary) hypertension: Secondary | ICD-10-CM

## 2023-03-30 ENCOUNTER — Other Ambulatory Visit: Payer: Self-pay | Admitting: Cardiology

## 2023-03-30 DIAGNOSIS — I10 Essential (primary) hypertension: Secondary | ICD-10-CM

## 2023-04-04 NOTE — Progress Notes (Unsigned)
  Cardiology Office Note   Date:  04/06/2023  ID:  [name]., DOB 11/04/1965, MRN [id number] PCP:  [name], John Patricia, MD [facility] Cardiologist: Olga Millers, MD  Reason for visit: Med refills/ 18 month follow-up  History of Present Illness    [name]. is a 57 y.o. male with a hx of sarcoidosis (neuro, lung, skin and renal involvement) on Imuran followed by rheumatology, normal coronary arteries by left heart cath 2014, calcium score of 0/normal coronaries on cardiac CTA 2019, PVCs on monitor 2017.  He also Dr. Jens Som in February 2023 and was feeling well.  Blood pressure controlled.  No changes to medications.  Today, patient states he is doing well.  He returns for atenolol refill.  He states his sarcoid is well-controlled; his main symptom is nerve related with tingling in his hands.  He denies chest pain, shortness of breath, syncope, orthopnea, PND or significant pedal edema.  Very rare palpitations.    Objective / Physical Exam   EKG today: Normal sinus rhythm with heart rate 60.    Vital signs:  BP 120/88 (BP Location: Left Arm, Patient Position: Sitting, Cuff Size: Normal)   Pulse 60   Ht 5\' 9"  (1.753 m)   Wt 220 lb (99.8 kg)   SpO2 96%   BMI 32.49 kg/m     GEN: No acute distress NECK: No carotid bruits CARDIAC: RRR, no murmurs RESPIRATORY:  Clear to auscultation without rales, wheezing or rhonchi  EXTREMITIES: No edema  Assessment and Plan   Palpitations, well controlled -Heart monitor 2017 with PVCs -EKG today with normal sinus rhythm with heart rate 60.  No ectopy. -Continue atenolol 50 mg once daily -refilled.  Precordial pain, resolved -LHC 2014 and CTA cors 2019 - normal coronary arteries -Healthy diet and exercise recommended.  Hypertension, well-controlled -Continue atenolol as above. -Goal BP is <130/80.  Recommend DASH diet (high in vegetables, fruits, low-fat dairy products, whole grains, poultry, fish, and nuts  and low in sweets, sugar-sweetened beverages, and red meats), salt restriction and increase physical activity.  Disposition - Follow-up in 1 year.  Signed, Cannon Kettle, PA-C  04/06/2023 [facility]

## 2023-04-06 ENCOUNTER — Ambulatory Visit: Payer: Self-pay | Attending: Physician Assistant | Admitting: Physician Assistant

## 2023-04-06 ENCOUNTER — Telehealth: Payer: Self-pay | Admitting: Licensed Clinical Social Worker

## 2023-04-06 ENCOUNTER — Encounter: Payer: Self-pay | Admitting: Physician Assistant

## 2023-04-06 VITALS — BP 120/88 | HR 60 | Ht 69.0 in | Wt 220.0 lb

## 2023-04-06 DIAGNOSIS — I10 Essential (primary) hypertension: Secondary | ICD-10-CM

## 2023-04-06 DIAGNOSIS — R002 Palpitations: Secondary | ICD-10-CM

## 2023-04-06 DIAGNOSIS — R072 Precordial pain: Secondary | ICD-10-CM

## 2023-04-06 MED ORDER — ATENOLOL 50 MG PO TABS: 50.0000 mg | ORAL_TABLET | Freq: Every day | ORAL | 3 refills | Status: DC

## 2023-04-06 NOTE — Telephone Encounter (Signed)
H&V Care Navigation CSW Progress Note  Clinical Social Worker contacted patient by phone to f/u after appt at [facility] office. Pt previously noted to have Medicare benefits. Now is listed as self- pay with no pharmacy benefits. I was unable to reach him today at [phone], left voicemail requesting return call. Will re-attempt as able to screen for assistance options.  Patient is participating in a Managed Medicaid Plan:  No, self pay only  SDOH Screenings   Food Insecurity: No Food Insecurity (09/27/2018)  Transportation Needs: No Transportation Needs (09/27/2018)  Depression (PHQ2-9): Low Risk  (06/23/2022)  Financial Resource Strain: Low Risk  (09/27/2018)  Physical Activity: Inactive (09/27/2018)  Social Connections: Unknown (01/17/2022)   Received from [facility], [facility]  Stress: Stress Concern Present (09/27/2018)  Tobacco Use: Medium Risk (04/06/2023)   Octavio Graves, MSW, LCSW Clinical Social Worker II [facility] Heart/Vascular Care Navigation  [phone]- work cell phone (preferred) [phone]- desk phone

## 2023-04-06 NOTE — Patient Instructions (Signed)
Medication Instructions:  No Changes *If you need a refill on your cardiac medications before your next appointment, please call your pharmacy*   Lab Work: No Labs If you have labs (blood work) drawn today and your tests are completely normal, you will receive your results only by: MyChart Message (if you have MyChart) OR A paper copy in the mail If you have any lab test that is abnormal or we need to change your treatment, we will call you to review the results.   Testing/Procedures: No Testing   Follow-Up: At [facility], you and your health needs are our priority.  As part of our continuing mission to provide you with exceptional heart care, we have created designated Provider Care Teams.  These Care Teams include your primary Cardiologist (physician) and Advanced Practice Providers (APPs -  Physician Assistants and Nurse Practitioners) who all work together to provide you with the care you need, when you need it.  We recommend signing up for the patient portal called "MyChart".  Sign up information is provided on this After Visit Summary.  MyChart is used to connect with patients for Virtual Visits (Telemedicine).  Patients are able to view lab/test results, encounter notes, upcoming appointments, etc.  Non-urgent messages can be sent to your provider as well.   To learn more about what you can do with MyChart, go to [email or web address].    Your next appointment:   1 year(s)  Provider:   Brian Crenshaw, MD   

## 2023-04-07 ENCOUNTER — Telehealth: Payer: Self-pay | Admitting: Licensed Clinical Social Worker

## 2023-04-07 NOTE — Telephone Encounter (Signed)
H&V Care Navigation CSW Progress Note  Clinical Social Worker contacted patient by phone to f/u after appt at [facility] office. Pt previously noted to have Medicare benefits. Now is listed as self- pay with no pharmacy benefits. I was unable to reach him again today at [phone], left second voicemail requesting return call. Will re-attempt as able to screen for assistance options.   Patient is participating in a Managed Medicaid Plan:  No, self pay only  SDOH Screenings   Food Insecurity: No Food Insecurity (09/27/2018)  Transportation Needs: No Transportation Needs (09/27/2018)  Depression (PHQ2-9): Low Risk  (06/23/2022)  Financial Resource Strain: Low Risk  (09/27/2018)  Physical Activity: Inactive (09/27/2018)  Social Connections: Unknown (01/17/2022)   Received from [facility], [facility]  Stress: Stress Concern Present (09/27/2018)  Tobacco Use: Medium Risk (04/06/2023)    Octavio Graves, MSW, LCSW Clinical Social Worker II [facility] Heart/Vascular Care Navigation  [phone]- work cell phone (preferred) [phone]- desk phone

## 2023-04-08 ENCOUNTER — Telehealth (HOSPITAL_BASED_OUTPATIENT_CLINIC_OR_DEPARTMENT_OTHER): Payer: Self-pay | Admitting: Licensed Clinical Social Worker

## 2023-04-08 NOTE — Progress Notes (Signed)
Heart and Vascular Care Navigation  04/08/2023  [name]. April 23, 1966 [id number]  Reason for Referral: uninsured Patient is participating in a Managed Medicaid Plan:no, self pay only  Engaged with patient by telephone for initial visit for Heart and Vascular Care Coordination.                                                                                                   Assessment: LCSW spoke with pt this morning at [phone]. Introduced self, role, reason for call. Pt confirmed home address, PCP, and emergency contact. 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 He previously had Medicare as he had disability benefits. No longer has either. We discussed referral for Medicaid- pt would like to be sent information before we make referral to caseworkers as he is out of town for the next few weeks. If not eligible he may be eligible for [employer] and [employer] assistance. He is okay with me following up about both when he returns.   No additional concerns with transportation, medication, food, or housing/utilities at this time.                                      HRT/VAS Care Coordination     Patients Home Cardiology Office [facility]   Outpatient Care Team Social Worker   Social Worker Name: Octavio Graves, [place], [phone]   Living arrangements for the past 2 months Single Family Home   Lives with: Self   Patient Current Insurance Coverage Self-Pay   Patient Has Concern With Paying Medical Bills Yes   Patient Concerns With Medical Bills no insurance, previously had disability and medicare. interested in information about Medicaid   Medical Bill Referrals: [facility] Financial Assistance and Medicaid flyer   Does Patient Have Prescription Coverage? No   Patient Prescription Assistance Programs [place] [facility]   [place] [facility] Medications mailed application   Home Assistive Devices/Equipment --  use a bipap, internal spinal  stemilator   HH Agency NA       Social History:                                                                             SDOH Screenings   Food Insecurity: No Food Insecurity (04/08/2023)  Housing: Low Risk  (04/08/2023)  Transportation Needs: No Transportation Needs (04/08/2023)  Depression (PHQ2-9): Low Risk  (06/23/2022)  Financial Resource Strain: Low Risk  (04/08/2023)  Physical Activity: Inactive (09/27/2018)  Social Connections: Unknown (01/17/2022)   [facility] from [facility], [facility]  Stress: Stress Concern Present (09/27/2018)  Tobacco Use: Medium Risk (04/06/2023)    SDOH Interventions: Financial Resources:  Financial Strain Interventions: Other (Comment), Financial  Counselor (Medicaid flyer, Ecologist) DSS for financial assistance and Editor, commissioning for [employer] Program  Food Insecurity:  Food Insecurity Interventions: Intervention Not Indicated  Housing Insecurity:  Housing Interventions: Intervention Not Indicated  Transportation:   Transportation Interventions: Intervention Not Indicated     Other Care Navigation Interventions:     Provided Pharmacy assistance resources [place] [facility]   Follow-up plan:   LCSW will mail pt the following: my card, Medicaid flyer, Therapist, nutritional. Pt will be contacted later this month as he will be out of town until the 23rd of this month.

## 2023-05-02 ENCOUNTER — Telehealth: Payer: Self-pay | Admitting: Licensed Clinical Social Worker

## 2023-05-02 NOTE — Telephone Encounter (Signed)
H&V Care Navigation CSW Progress Note  Clinical Social Worker contacted patient by phone to f/u on assistance applications mailed as pt was out of town through 8/23. I was unable to reach pt at [phone], left voicemail requesting return call. Will re-attempt again as able.  Patient is participating in a Managed Medicaid Plan:  No, self pay only  SDOH Screenings   Food Insecurity: No Food Insecurity (04/08/2023)  Housing: Low Risk  (04/08/2023)  Transportation Needs: No Transportation Needs (04/08/2023)  Depression (PHQ2-9): Low Risk  (06/23/2022)  Financial Resource Strain: Low Risk  (04/08/2023)  Physical Activity: Inactive (09/27/2018)  Social Connections: Unknown (01/17/2022)   Received from [facility], [facility]  Stress: Stress Concern Present (09/27/2018)  Tobacco Use: High Risk (04/11/2023)   Received from [facility]    Octavio Graves, MSW, LCSW Clinical Social Worker II [facility] Heart/Vascular Care Navigation  [phone]- work cell phone (preferred) [phone]- desk phone

## 2023-05-03 ENCOUNTER — Telehealth: Payer: Self-pay | Admitting: Licensed Clinical Social Worker

## 2023-05-03 NOTE — Telephone Encounter (Signed)
H&V Care Navigation CSW Progress Note  Clinical Social Worker contacted patient by phone to f/u on assistance applications. He confirmed he has received them. Has not had time to read over them comprehensively yet since getting back into town. He is amenable to me reaching out to him next week to answer any questions/concerns. Did clarify if he does not meet criteria for Medicaid expansion he should consider applying for [employer] and [employer].  Patient is participating in a Managed Medicaid Plan:  No, self pay only  SDOH Screenings   Food Insecurity: No Food Insecurity (04/08/2023)  Housing: Low Risk  (04/08/2023)  Transportation Needs: No Transportation Needs (04/08/2023)  Depression (PHQ2-9): Low Risk  (06/23/2022)  Financial Resource Strain: Low Risk  (04/08/2023)  Physical Activity: Inactive (09/27/2018)  Social Connections: Unknown (01/17/2022)   Received from [facility], [facility]  Stress: Stress Concern Present (09/27/2018)  Tobacco Use: High Risk (04/11/2023)   Received from [facility]   Octavio Graves, MSW, LCSW Clinical Social Worker II [facility] Heart/Vascular Care Navigation  [phone]- work cell phone (preferred) [phone]- desk phone

## 2023-05-11 ENCOUNTER — Telehealth: Payer: Self-pay | Admitting: Licensed Clinical Social Worker

## 2023-05-11 NOTE — Telephone Encounter (Signed)
H&V Care Navigation CSW Progress Note  Clinical Social Worker contacted patient by phone to f/u on assistance applications. I was able to reach him at [phone]. Shares he is still trying to catch up on work since vacation- he will call me tomorrow regarding applications and with any questions.  Patient is participating in a Managed Medicaid Plan:  No, self pay only  SDOH Screenings   Food Insecurity: No Food Insecurity (04/08/2023)  Housing: Low Risk  (04/08/2023)  Transportation Needs: No Transportation Needs (04/08/2023)  Depression (PHQ2-9): Low Risk  (06/23/2022)  Financial Resource Strain: Low Risk  (04/08/2023)  Physical Activity: Inactive (09/27/2018)  Social Connections: Unknown (01/17/2022)   Received from [facility], [facility]  Stress: Stress Concern Present (09/27/2018)  Tobacco Use: High Risk (04/11/2023)   Received from [facility]   Octavio Graves, MSW, LCSW Clinical Social Worker II [facility] Heart/Vascular Care Navigation  [phone]- work cell phone (preferred) [phone]- desk phone

## 2023-05-18 ENCOUNTER — Telehealth: Payer: Self-pay | Admitting: Licensed Clinical Social Worker

## 2023-05-18 NOTE — Telephone Encounter (Signed)
H&V Care Navigation CSW Progress Note  Clinical Social Worker contacted patient by phone to f/u on assistance applications. I was able to reach him again at [phone]. Shares he has still been unable to work on applications. 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 Shared my condolences, he requests I call him back again. I shared I will call him one more time to f/u. I encouraged him to call me since my contact information is included with documents since I have contacted him multiple times to f/u at this point.    Patient is participating in a Managed Medicaid Plan:  No, self pay only    SDOH Screenings   Food Insecurity: No Food Insecurity (04/08/2023)  Housing: Low Risk  (04/08/2023)  Transportation Needs: No Transportation Needs (04/08/2023)  Depression (PHQ2-9): Low Risk  (06/23/2022)  Financial Resource Strain: Low Risk  (04/08/2023)  Physical Activity: Inactive (09/27/2018)  Social Connections: Unknown (01/17/2022)   Received from [facility], [facility]  Stress: Stress Concern Present (09/27/2018)  Tobacco Use: High Risk (04/11/2023)   Received from [facility]   Octavio Graves, MSW, LCSW Clinical Social Worker II [facility] Heart/Vascular Care Navigation  [phone]- work cell phone (preferred) [phone]- desk phone

## 2023-06-07 ENCOUNTER — Telehealth: Payer: Self-pay | Admitting: Licensed Clinical Social Worker

## 2023-06-07 NOTE — Telephone Encounter (Signed)
H&V Care Navigation CSW Progress Note  Clinical Social Worker contacted patient by phone to f/u on assistance applications. I sent a reminder text to [phone]. I encouraged him to call me as needed. Pt has been provided multiple f/u attempts at this time. Remain available as engaged by pt.    Patient is participating in a Managed Medicaid Plan:  No, self pay only    SDOH Screenings   Food Insecurity: No Food Insecurity (04/08/2023)  Housing: Low Risk  (04/08/2023)  Transportation Needs: No Transportation Needs (04/08/2023)  Depression (PHQ2-9): Low Risk  (06/23/2022)  Financial Resource Strain: Low Risk  (04/08/2023)  Physical Activity: Inactive (09/27/2018)  Social Connections: Unknown (01/17/2022)   Received from [facility], [facility]  Stress: Stress Concern Present (09/27/2018)  Tobacco Use: High Risk (04/11/2023)   Received from [facility]    Octavio Graves, MSW, LCSW Clinical Social Worker II [facility] Heart/Vascular Care Navigation  [phone]- work cell phone (preferred) [phone]- desk phone

## 2023-06-09 ENCOUNTER — Telehealth: Payer: Self-pay | Admitting: Licensed Clinical Social Worker

## 2023-06-09 NOTE — Telephone Encounter (Signed)
H&V Care Navigation CSW Progress Note  Clinical Social Worker contacted patient by phone to f/u again on assistance applications as pt has not f/u with me or responded to text message sent on 10/1. Was able to reach pt today, shares he continues to have not reviewed patient assistance applications. I shared since I have f/u multiple times (9 calls/texts) but pt hasn't started yet that I remain available should pt begin applications and call me for further assistance. Will not continue to outreach at this time.   Patient is participating in a Managed Medicaid Plan:  No, self pay only  SDOH Screenings   Food Insecurity: No Food Insecurity (04/08/2023)  Housing: Low Risk  (04/08/2023)  Transportation Needs: No Transportation Needs (04/08/2023)  Depression (PHQ2-9): Low Risk  (06/23/2022)  Financial Resource Strain: Low Risk  (04/08/2023)  Physical Activity: Inactive (09/27/2018)  Social Connections: Unknown (01/17/2022)   Received from [facility]  Stress: Stress Concern Present (09/27/2018)  Tobacco Use: High Risk (04/11/2023)   Received from [facility]    Octavio Graves, MSW, LCSW Clinical Social Worker II [facility] Heart/Vascular Care Navigation  [phone]- work cell phone (preferred) [phone]- desk phone

## 2023-09-09 ENCOUNTER — Ambulatory Visit: Payer: Self-pay | Admitting: Internal Medicine

## 2023-09-09 NOTE — Telephone Encounter (Signed)
 Copied from CRM [id number]. Topic: Clinical - Red Word Triage >> Sep 09, 2023  8:21 AM Pinkey ORN wrote: Red Word that prompted transfer to Nurse Triage: Coughing Up Blood, Blood When Blowing Nose, Hives Appearing After Taking Theraflu  Chief Complaint: coughing up bloody sputum  Symptoms: bloody sputum and nasal drainage mixed with blood when blow nose, some chest tightness  Frequency: 1 week Pertinent Negatives: Patient denies SOB Disposition: [] ED /[x] Urgent Care (no appt availability in office) / [] Appointment(In office/virtual)/ []  [place] [facility]/ [] Home Care/ [] Refused Recommended Disposition /[] [place] [facility]/ []  Follow-up with PCP Additional Notes: pt took COVID test and results negative.  Nurse scheduled appt at urgent care 09/10/2023  Reason for Disposition  [1] Has underlying lung disease (e.g., COPD, chronic bronchitis or emphysema) AND [2] sputum has turned yellow or green in color  Answer Assessment - Initial Assessment Questions 1. ONSET: When did the cough begin?      1 week 2. SEVERITY: How bad is the cough today? Did the blood appear after a coughing spell?      Coughing up blood 3 to 4 days  and blow nose has blood in it 3. SPUTUM: Describe the color of your sputum (none, dry cough; clear, white, yellow, green)     yellow 4. HEMOPTYSIS: How much blood? (flecks, streaks, tablespoons, etc.)     Some blood in sputum 5. DIFFICULTY BREATHING: Are you having difficulty breathing? If Yes, ask: How bad is it? (e.g., mild, moderate, severe)    - MILD: No SOB at rest, mild SOB with walking, speaks normally in sentences, can lie down, no retractions, pulse < 100.    - MODERATE: SOB at rest, SOB with minimal exertion and prefers to sit, cannot lie down flat, speaks in phrases, mild retractions, audible wheezing, pulse 100-120.    - SEVERE: Very SOB at rest, speaks in single words, struggling to breathe, sitting hunched forward, retractions, pulse > 120       No- some tightness in chest 6. FEVER: Do you have a fever? If Yes, ask: What is your temperature, how was it measured, and when did it start?     No and COVID - 7. CARDIAC HISTORY: Do you have any history of heart disease? (e.g., heart attack, congestive heart failure)      yes 8. LUNG HISTORY: Do you have any history of lung disease?  (e.g., pulmonary embolus, asthma, emphysema)    Ihss - something dealing with flapper in heart 9. PE RISK FACTORS: Do you have a history of blood clots? (or: recent major surgery, recent prolonged travel, bedridden)     no 10. OTHER SYMPTOMS: Do you have any other symptoms? (e.g., runny nose, wheezing, chest pain)       no 11. PREGNANCY: Is there any chance you are pregnant? When was your last menstrual period?       no 12. TRAVEL: Have you traveled out of the country in the last month? (e.g., travel history, exposures)       yes  Protocols used: Coughing Up Blood-A-AH

## 2023-09-10 ENCOUNTER — Ambulatory Visit (HOSPITAL_COMMUNITY)
Admission: EM | Admit: 2023-09-10 | Discharge: 2023-09-10 | Disposition: A | Discharge status: Home / Self Care | Payer: Self-pay | Attending: Emergency Medicine | Admitting: Emergency Medicine

## 2023-09-10 ENCOUNTER — Encounter (HOSPITAL_COMMUNITY): Payer: Self-pay

## 2023-09-10 ENCOUNTER — Ambulatory Visit (INDEPENDENT_AMBULATORY_CARE_PROVIDER_SITE_OTHER): Payer: Self-pay

## 2023-09-10 DIAGNOSIS — J069 Acute upper respiratory infection, unspecified: Secondary | ICD-10-CM

## 2023-09-10 DIAGNOSIS — R051 Acute cough: Secondary | ICD-10-CM

## 2023-09-10 MED ORDER — AZITHROMYCIN 250 MG PO TABS
250.0000 mg | ORAL_TABLET | Freq: Every day | ORAL | 0 refills | Status: AC
Start: 2023-09-10 — End: ?

## 2023-09-10 MED ORDER — PREDNISONE 20 MG PO TABS
40.0000 mg | ORAL_TABLET | Freq: Every day | ORAL | 0 refills | Status: AC
Start: 2023-09-10 — End: 2023-09-15

## 2023-09-10 MED ORDER — BENZONATATE 200 MG PO CAPS: 200.0000 mg | ORAL_CAPSULE | Freq: Three times a day (TID) | ORAL | 0 refills | Status: AC

## 2023-09-10 NOTE — ED Provider Notes (Addendum)
 [facility]    CSN: [id number] Arrival date & time: 09/10/23  1200      History   Chief Complaint Chief Complaint  Patient presents with   Appointment   Cough    HPI [name]. is a 58 y.o. male.   Patient presents to clinic for complaints of a productive cough with nasal drainage and chest congestion as well as chills.  Symptoms have been present for the past week.  He has not had any fevers.  No fatigue.  Endorses a cough with bloody sputum, unable to really describe the sputum.  Appears nasal congestion has some streaks of blood in it or maybe it is orange-tinged.  Initially this started with his sickness, his wife was sick, she is now better and he is not.  He did test negative for COVID-19.  Patient did recently travel to [place] in [place].  Patient with biopsy proven sarcoidosis, on Imuran  200 mg daily. Was seen by rheumatology on 12/5 and placed on steroid taper for cough.   Has tried TheraFlu without much relief.  Denies any shortness of breath or wheezing.  No lower extremity edema.  No history of DVT.  The history is provided by the patient and medical records.  Cough   Past Medical History:  Diagnosis Date   Allergy    Anginal pain (HCC)    s/p cardiac cath 07/19/13 showing NL coronaries, EF 50-55%   Anxiety    Asthma    as child   B12 DEFICIENCY 03/06/2007   Cancer (HCC)    prostate ca   CARDIOMYOPATHY, IDIOPATHIC HYPERTROPHIC 03/06/2007   Clotting disorder (HCC)    knee    DEPRESSION 03/06/2007   DVT (deep venous thrombosis) (HCC)    Dysrhythmia    sees Dr. Pietro for palpitations   GERD 06/01/2007   History of kidney stones    HYPERCHOLESTEROLEMIA 03/06/2007   HYPERTENSION 03/06/2007   Dr. Jame   IMPAIRED GLUCOSE TOLERANCE 11/08/2008   NEPHROLITHIASIS, HX OF 04/07/2010   NEUROSARCOIDOSIS 03/06/2007   OSTEOARTHRITIS 06/01/2007   Palpitations 10/23/2007   Peripheral vascular disease (HCC) 03   dvt   Pneumonia    hx of    Primary idiopathic hypertrophic cardiomyopathy (HCC)    Prostate cancer (HCC)    SLEEP APNEA 03/06/2007   uses vpap, last sleep study 2011, Dr. 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   Dyslipidemia 10/07/2011   Orthostasis 10/07/2011   Besnier-Boeck disease 09/15/2011   Sarcoid (HCC) 08/18/2011   NEPHROLITHIASIS, HX OF 04/07/2010   IMPAIRED GLUCOSE TOLERANCE 11/08/2008   Pure hypercholesterolemia 11/08/2008   GERD 06/01/2007   OSTEOARTHRITIS 06/01/2007   NEUROSARCOIDOSIS 03/06/2007   Vitamin B12 deficiency 03/06/2007   Depression, recurrent (HCC) 03/06/2007   SYNDROME, CHRONIC PAIN 03/06/2007   Essential hypertension 03/06/2007   Hypertrophic obstructive cardiomyopathy (HCC) 03/06/2007   Sleep apnea 03/06/2007    Past Surgical History:  Procedure Laterality Date   CARDIAC CATHETERIZATION     decompression and fusion     cervicothoracic   ELBOW SURGERY Left    EYE SURGERY Bilateral    blepharoplasty  HAND SURGERY Left    LEFT HEART CATHETERIZATION WITH CORONARY ANGIOGRAM N/A 07/19/2013   Procedure: LEFT HEART CATHETERIZATION WITH CORONARY ANGIOGRAM;  Surgeon: Toribio JONELLE Fuel, MD;  Location: [facility] CATH LAB;  Service: Cardiovascular;  Laterality: N/A;   LEG SKIN LESION  BIOPSY / EXCISION     left leg   LUMBAR FUSION     PARTIAL NEPHRECTOMY Left    bx   PENILE PROSTHESIS IMPLANT     PROSTATE SURGERY     robotic prostatectomy   SPINAL CORD STIMULATOR INSERTION N/A 04/11/2014   Procedure: LUMBAR SPINAL CORD STIMULATOR INSERTION;  Surgeon: Reyes Budge, MD;  Location: [facility];  Service: Neurosurgery;   Laterality: N/A;   TONSILLECTOMY     UPPER GASTROINTESTINAL ENDOSCOPY  2009       Home Medications    Prior to Admission medications   Medication Sig Start Date End Date Taking? Authorizing Provider  atenolol  (TENORMIN ) 50 MG tablet Take 1 tablet (50 mg total) by mouth daily. 04/06/23  Yes Lambert, Jennifer K, PA-C  azaTHIOprine  (IMURAN ) 50 MG tablet Take 250 mg by mouth daily.    Yes [provider]  azithromycin  (ZITHROMAX ) 250 MG tablet Take 1 tablet (250 mg total) by mouth daily. Take first 2 tablets together, then 1 every day until finished. 09/10/23  Yes Darus Hershman  N, FNP  benzonatate  (TESSALON ) 200 MG capsule Take 1 capsule (200 mg total) by mouth every 8 (eight) hours. 09/10/23  Yes Romualdo Prosise  N, FNP  cyanocobalamin  (,VITAMIN B-12,) 1000 MCG/ML injection INJECT 1 ML (CC) INTRAMUSCULARLY ONCE WEEKLY 02/18/22  Yes Theophilus Andrews, Tully GRADE, MD  fluticasone  (FLONASE ) 50 MCG/ACT nasal spray Place 1 spray into both nostrils daily. 09/19/20  Yes Mercer Clotilda JONELLE, MD  lidocaine  (XYLOCAINE ) 5 % ointment Apply 1 application topically daily as needed for moderate pain.    Yes [provider]  Polyethyl Glycol-Propyl Glycol (SYSTANE PRESERVATIVE FREE OP) Place 1 drop into both eyes 2 (two) times daily.   Yes [provider]  predniSONE  (DELTASONE ) 20 MG tablet Take 2 tablets (40 mg total) by mouth daily for 5 days. 09/10/23 09/15/23 Yes Lashone Stauber  N, FNP  SYRINGE-NEEDLE, DISP, 3 ML (BD SAFETYGLIDE SYRINGE/NEEDLE) 25G X 1 3 ML MISC Use for B12 injections 01/28/22  Yes Theophilus Andrews, Estela Y, MD  triamcinolone  ointment (KENALOG ) 0.1 % Apply 1 application topically daily.    Yes [provider]    Family History Family History  Problem Relation Age of Onset   Heart disease Mother    Kidney disease Neg Hx        family hx    Cancer Neg Hx        family hx prostate   Colon cancer Neg Hx    Colon polyps Neg Hx    Rectal cancer Neg Hx     Stomach cancer Neg Hx     Social History Social History   Tobacco Use   Smoking status: Former    Current packs/day: 0.00    Average packs/day: 0.5 packs/day for 3.0 years (1.5 ttl pk-yrs)    Types: Cigarettes    Start date: 09/06/1980    Quit date: 09/07/1983    Years since quitting: 40.0   Smokeless tobacco: Never   Tobacco comments:    brief history   Substance Use Topics   Alcohol  use: No    Comment: occ   Drug use: No     Allergies   Adhesive [tape] and Pollen extract  Review of Systems Review of Systems  Per HPI   Physical Exam Triage Vital Signs ED Triage Vitals  Encounter Vitals Group     BP 09/10/23 1215 116/73     Systolic BP Percentile --      Diastolic BP Percentile --      Pulse Rate 09/10/23 1215 (!) 52     Resp 09/10/23 1215 18     Temp 09/10/23 1215 97.8 F (36.6 C)     Temp Source 09/10/23 1215 Oral     SpO2 09/10/23 1215 96 %     Weight 09/10/23 1215 225 lb (102.1 kg)     Height 09/10/23 1215 5' 9 (1.753 m)     Head Circumference --      Peak Flow --      Pain Score 09/10/23 1212 8     Pain Loc --      Pain Education --      Exclude from Growth Chart --    No data found.  Updated Vital Signs BP 116/73 (BP Location: Left Arm)   Pulse (!) 52   Temp 97.8 F (36.6 C) (Oral)   Resp 18   Ht 5' 9 (1.753 m)   Wt 225 lb (102.1 kg)   SpO2 96%   BMI 33.23 kg/m   Visual Acuity Right Eye Distance:   Left Eye Distance:   Bilateral Distance:    Right Eye Near:   Left Eye Near:    Bilateral Near:     Physical Exam Vitals and nursing note reviewed.  Constitutional:      Appearance: Normal appearance.  HENT:     Head: Normocephalic and atraumatic.     Right Ear: External ear normal.     Left Ear: External ear normal.     Nose: Congestion and rhinorrhea present.     Mouth/Throat:     Mouth: Mucous membranes are moist.     Pharynx: No posterior oropharyngeal erythema.  Eyes:     General: No scleral icterus. Cardiovascular:      Rate and Rhythm: Normal rate and regular rhythm.     Heart sounds: Normal heart sounds. No murmur heard. Pulmonary:     Effort: No respiratory distress.     Breath sounds: Normal breath sounds.  Musculoskeletal:        General: Normal range of motion.  Skin:    General: Skin is warm and dry.  Neurological:     General: No focal deficit present.     Mental Status: He is alert and oriented to person, place, and time.  Psychiatric:        Mood and Affect: Mood normal.        Behavior: Behavior normal.      UC Treatments / Results  Labs (all labs ordered are listed, but only abnormal results are displayed) Labs Reviewed - No data to display  EKG   Radiology DG Chest 2 View Result Date: 09/10/2023 CLINICAL DATA:  Cough for 1 week. EXAM: CHEST - 2 VIEW COMPARISON:  Chest radiograph dated 05/14/2016. FINDINGS: The heart size and mediastinal contours are within normal limits. Mild bilateral lung predominant interstitial opacities/atelectasis. No pleural effusion or pneumothorax. Degenerative changes are seen in the spine. IMPRESSION: Mild bilateral lung predominant interstitial opacities/atelectasis. Electronically Signed   By: Norman Hopper M.D.   On: 09/10/2023 12:53    Procedures Procedures (including critical care time)  Medications Ordered in UC Medications - No data to display  Initial Impression /  Assessment and Plan / UC Course  I have reviewed the triage vital signs and the nursing notes.  Pertinent labs & imaging results that were available during my care of the patient were reviewed by me and considered in my medical decision making (see chart for details).  Vitals and triage reviewed, patient is hemodynamically stable.  Lungs are vesicular, heart with regular rate and rhythm.  Does have rhinorrhea and congestion on physical exam.  Wells criteria with the hemoptysis is 1.90, low-risk.  Heart rate may be impacted by atenolol .  He has not been immobilized, but has had  recent travel.  No lower leg edema.  No shortness of breath.  Sarcoidosis may be contributing. Does have a history of prostate cancer.  With some blood and nasal congestion as well as send sputum and cough with recent exposure to sick wife, more suspicious for upper respiratory infection.  Chest x-ray interpretation suspicious for opacities.  Will treat with azithromycin , steroid burst and Tessalon .  Strict emergency and follow-up precautions given if hemoptysis continues or if symptoms evolve.  Patient verbalized understanding, no questions at this time.     Final Clinical Impressions(s) / UC Diagnoses   Final diagnoses:  Acute cough  Upper respiratory tract infection, unspecified type     Discharge Instructions      Take the antibiotics as prescribed.  Take the steroids daily with breakfast.  Use the cough medicine as needed.  Ensure you are drinking plenty of water and getting a good amount of sleep.  Please do not hesitate to seek immediate care at the nearest emergency department if you continue to have bloody sputum, develop shortness of breath, or any new concerning symptoms.      ED Prescriptions     Medication Sig Dispense Auth. Provider   benzonatate  (TESSALON ) 200 MG capsule Take 1 capsule (200 mg total) by mouth every 8 (eight) hours. 30 capsule Dreama, Ho Parisi  N, FNP   azithromycin  (ZITHROMAX ) 250 MG tablet Take 1 tablet (250 mg total) by mouth daily. Take first 2 tablets together, then 1 every day until finished. 6 tablet Dreama, Jasmene Goswami  N, FNP   predniSONE  (DELTASONE ) 20 MG tablet Take 2 tablets (40 mg total) by mouth daily for 5 days. 10 tablet Dreama, Duane Trias  N, FNP      PDMP not reviewed this encounter.       Dreama, Khyan Oats  N, FNP 09/10/23 1259

## 2023-09-10 NOTE — Discharge Instructions (Addendum)
 Take the antibiotics as prescribed.  Take the steroids daily with breakfast.  Use the cough medicine as needed.  Ensure you are drinking plenty of water and getting a good amount of sleep.  Please do not hesitate to seek immediate care at the nearest emergency department if you continue to have bloody sputum, develop shortness of breath, or any new concerning symptoms.

## 2023-09-10 NOTE — ED Triage Notes (Signed)
 Patient having productive cough with blood, nasal drainage with blood, chest tightness, chills. Onset over a week ago. Patient was sick first and wife was sick but better now. Patient tested negative for Covid. Patient recently travelled to [place] and [place].   Patient tried Theraflu with little relief.

## 2023-11-16 ENCOUNTER — Other Ambulatory Visit: Payer: Self-pay | Admitting: Internal Medicine

## 2023-11-16 DIAGNOSIS — E538 Deficiency of other specified B group vitamins: Secondary | ICD-10-CM

## 2024-05-28 ENCOUNTER — Other Ambulatory Visit: Payer: Self-pay

## 2024-05-28 DIAGNOSIS — I10 Essential (primary) hypertension: Secondary | ICD-10-CM

## 2024-05-29 MED ORDER — ATENOLOL 50 MG PO TABS: 50.0000 mg | ORAL_TABLET | Freq: Every day | ORAL | 0 refills | Status: DC

## 2024-07-08 ENCOUNTER — Other Ambulatory Visit: Payer: Self-pay | Admitting: Cardiology

## 2024-07-08 DIAGNOSIS — I10 Essential (primary) hypertension: Secondary | ICD-10-CM

## 2024-07-17 ENCOUNTER — Telehealth: Payer: Self-pay | Admitting: Cardiology

## 2024-07-17 DIAGNOSIS — I10 Essential (primary) hypertension: Secondary | ICD-10-CM

## 2024-07-17 MED ORDER — ATENOLOL 50 MG PO TABS: 50.0000 mg | ORAL_TABLET | Freq: Every day | ORAL | 0 refills | Status: AC

## 2024-07-17 NOTE — Telephone Encounter (Signed)
*  STAT* If patient is at the pharmacy, call can be transferred to refill team.   1. Which medications need to be refilled? (please list name of each medication and dose if known)   atenolol  (TENORMIN ) 50 MG tablet   2. Would you like to learn more about the convenience, safety, & potential cost savings by using the [facility]?   3. Are you open to using the [facility] (Type [facility]. ).  4. Which pharmacy/location (including street and city if local pharmacy) is medication to be sent to?  [facility] [address] [place], [place] - [address]   5. Do they need a 30 day or 90 day supply?   Patient stated he still has some medication but will be leaving to go on vacation tomorrow (11/12) and will run out of medication.  Patient has appointment scheduled with Dr. Pietro on 10/09/24.

## 2024-07-17 NOTE — Telephone Encounter (Signed)
 Pt scheduled to see Dr. Pietro 10/09/24.  Refill has been sent.

## 2024-09-20 NOTE — Progress Notes (Unsigned)
 "    HPI: Follow-up chest pain.  Cardiac MRI January 2012 showed normal LV function, mild left atrial enlargement and no infiltrative process.  Patient had cardiac catheterization in 2014 that showed normal coronary arteries and normal LV function.  Monitor May 2017 showed sinus with PVCs.  Cardiac CTA October 2019 showed calcium  score of 0 and no coronary disease; prominent lymph nodes noted felt likely reactive; follow-up chest CT could be considered in 6 months. Echocardiogram September 2021 showed normal LV function, basal septal hypertrophy, mild to moderate left atrial enlargement, chordal Sam.  Chest CT September 2021 with small bilateral pulmonary nodules and benign etiology such as sarcoidosis favored.  Since last seen   Current Outpatient Medications  Medication Sig Dispense Refill   atenolol  (TENORMIN ) 50 MG tablet Take 1 tablet (50 mg total) by mouth daily. 90 tablet 0   azaTHIOprine  (IMURAN ) 50 MG tablet Take 250 mg by mouth daily.      azithromycin  (ZITHROMAX ) 250 MG tablet Take 1 tablet (250 mg total) by mouth daily. Take first 2 tablets together, then 1 every day until finished. 6 tablet 0   benzonatate  (TESSALON ) 200 MG capsule Take 1 capsule (200 mg total) by mouth every 8 (eight) hours. 30 capsule 0   cyanocobalamin  (,VITAMIN B-12,) 1000 MCG/ML injection INJECT 1 ML (CC) INTRAMUSCULARLY ONCE WEEKLY 4 mL 0   fluticasone  (FLONASE ) 50 MCG/ACT nasal spray Place 1 spray into both nostrils daily. 16 g 0   lidocaine  (XYLOCAINE ) 5 % ointment Apply 1 application topically daily as needed for moderate pain.      Polyethyl Glycol-Propyl Glycol (SYSTANE PRESERVATIVE FREE OP) Place 1 drop into both eyes 2 (two) times daily.     SYRINGE-NEEDLE, DISP, 3 ML (BD SAFETYGLIDE SYRINGE/NEEDLE) 25G X 1 3 ML MISC Use for B12 injections 100 each 11   triamcinolone  ointment (KENALOG ) 0.1 % Apply 1 application topically daily.      Current Facility-Administered Medications  Medication Dose Route  Frequency Provider Last Rate Last Admin   0.9 %  sodium chloride  infusion  500 mL Intravenous Once Danis, Henry L III, MD         Past Medical History:  Diagnosis Date   Allergy    Anginal pain    s/p cardiac cath 07/19/13 showing NL coronaries, EF 50-55%   Anxiety    Asthma    as child   B12 DEFICIENCY 03/06/2007   Cancer (HCC)    prostate ca   CARDIOMYOPATHY, IDIOPATHIC HYPERTROPHIC 03/06/2007   Clotting disorder    knee    DEPRESSION 03/06/2007   DVT (deep venous thrombosis) (HCC)    Dysrhythmia    sees Dr. Pietro for palpitations   GERD 06/01/2007   History of kidney stones    HYPERCHOLESTEROLEMIA 03/06/2007   HYPERTENSION 03/06/2007   Dr. 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 FABIENE Cliche, at [facility]   SYNDROME, CHRONIC PAIN 03/06/2007    Past Surgical History:  Procedure Laterality Date   CARDIAC CATHETERIZATION     decompression and fusion     cervicothoracic   ELBOW SURGERY Left    EYE SURGERY Bilateral  blepharoplasty   HAND SURGERY Left    LEFT HEART CATHETERIZATION WITH CORONARY ANGIOGRAM N/A 07/19/2013   Procedure: LEFT HEART CATHETERIZATION WITH CORONARY ANGIOGRAM;  Surgeon: Toribio JONELLE Fuel, MD;  Location: [facility] CATH LAB;  Service: Cardiovascular;  Laterality: N/A;   LEG SKIN LESION  BIOPSY / EXCISION     left leg   LUMBAR FUSION     PARTIAL NEPHRECTOMY Left    bx   PENILE PROSTHESIS IMPLANT     PROSTATE SURGERY     robotic prostatectomy   SPINAL CORD STIMULATOR INSERTION N/A 04/11/2014   Procedure: LUMBAR SPINAL CORD STIMULATOR INSERTION;  Surgeon: Reyes Budge, MD;  Location: [facility];  Service: Neurosurgery;   Laterality: N/A;   TONSILLECTOMY     UPPER GASTROINTESTINAL ENDOSCOPY  2009    Social History   Socioeconomic History   Marital status: Married    Spouse name: Not on file   Number of children: Not on file   Years of education: Not on file   Highest education level: Not on file  Occupational History   Occupation: [employer]  Tobacco Use   Smoking status: Former    Current packs/day: 0.00    Average packs/day: 0.5 packs/day for 3.0 years (1.5 ttl pk-yrs)    Types: Cigarettes    Start date: 09/06/1980    Quit date: 09/07/1983    Years since quitting: 41.0   Smokeless tobacco: Never   Tobacco comments:    brief history   Substance and Sexual Activity   Alcohol  use: No    Comment: occ   Drug use: No   Sexual activity: Yes  Other Topics Concern   Not on file  Social History Narrative   Recently divorced, lives alone, negotiating with wife currently regarding having son live with him, high stress   Has three children, two local, one in [place].   Enjoys swimming, wants to get back into swimming routine   Finished schooling for funeral services, currently finishing up apprenticeship.    Social Drivers of Health   Tobacco Use: High Risk (05/17/2024)   Received from [facility]   Patient History    Smoking Tobacco Use: Every Day    Smokeless Tobacco Use: Never    Passive Exposure: Not on file  Financial Resource Strain: Low Risk (04/08/2023)   Overall Financial Resource Strain (CARDIA)    Difficulty of Paying Living Expenses: Not very hard  Food Insecurity: Low Risk (05/17/2024)   Received from [facility]   Epic    Within the past 12 months, you worried that your food would run out before you got money to buy more: Never true    Within the past 12 months, the food you bought just didn't last and you didn't have money to get more. : Never true  Transportation Needs: No Transportation Needs (05/17/2024)   Received from [facility]    In the past 12  months, has lack of reliable transportation kept you from medical appointments, meetings, work or from getting things needed for daily living? : No  Physical Activity: Not on file  Stress: Not on file  Social Connections: Unknown (01/17/2022)   Received from [facility]   Social Network    Social Network: Not on file  Intimate Partner Violence: Unknown (12/09/2021)   Received from [facility]   HITS    Physically Hurt: Not on file    Insult or Talk Down To: Not on file  Threaten Physical Harm: Not on file    Scream or Curse: Not on file  Depression (PHQ2-9): Low Risk (06/23/2022)   Depression (PHQ2-9)    PHQ-2 Score: 0  Alcohol  Screen: Not on file  Housing: Low Risk (05/17/2024)   Received from [facility]   Epic    What is your living situation today?: I have a steady place to live    Think about the place you live. Do you have problems with any of the following? Choose all that apply:: Not on file  Utilities: Low Risk (05/17/2024)   Received from [facility]   Utilities    In the past 12 months has the electric, gas, oil, or water company threatened to shut off services in your home? : No  Health Literacy: Not on file    Family History  Problem Relation Age of Onset   Heart disease Mother    Kidney disease Neg Hx        family hx    Cancer Neg Hx        family hx prostate   Colon cancer Neg Hx    Colon polyps Neg Hx    Rectal cancer Neg Hx    Stomach cancer Neg Hx     ROS: no fevers or chills, productive cough, hemoptysis, dysphasia, odynophagia, melena, hematochezia, dysuria, hematuria, rash, seizure activity, orthopnea, PND, pedal edema, claudication. Remaining systems are negative.  Physical Exam: Well-developed well-nourished in no acute distress.  Skin is warm and dry.  HEENT is normal.  Neck is supple.  Chest is clear to auscultation with normal expansion.  Cardiovascular exam is regular rate and rhythm.  Abdominal exam nontender or distended. No  masses palpated. Extremities show no edema. neuro grossly intact  ECG- personally reviewed  A/P  1 history of chest pain-symptoms are atypical.  Electrocardiogram shows no new ST changes.  Plan to continue observation at this point.  2 hypertension-patient's blood pressure is controlled.  Continue present medications.  3 palpitations-well-controlled.  Continue beta-blocker.  4 hyperlipidemia-per primary care.  5 history of sarcoidosis-managed by rheumatology.  Redell Shallow, MD    "

## 2024-10-03 ENCOUNTER — Ambulatory Visit: Payer: Self-pay | Admitting: Cardiology

## 2024-10-09 ENCOUNTER — Ambulatory Visit: Payer: Self-pay | Admitting: Cardiology

## 2025-01-03 ENCOUNTER — Ambulatory Visit: Admitting: Cardiology
